# Patient Record
Sex: Male | Born: 1935 | Hispanic: No | Smoking: Never smoker
Health system: Southern US, Community
[De-identification: ages and names within clinical notes are randomized; demographics above are authoritative.]

## PROBLEM LIST (undated history)

## (undated) DIAGNOSIS — G453 Amaurosis fugax: Principal | ICD-10-CM

## (undated) DIAGNOSIS — R972 Elevated prostate specific antigen [PSA]: Secondary | ICD-10-CM

## (undated) DIAGNOSIS — C61 Malignant neoplasm of prostate: Secondary | ICD-10-CM

## (undated) DIAGNOSIS — M1990 Unspecified osteoarthritis, unspecified site: Secondary | ICD-10-CM

## (undated) DIAGNOSIS — E785 Hyperlipidemia, unspecified: Secondary | ICD-10-CM

## (undated) HISTORY — DX: Unspecified osteoarthritis, unspecified site: M19.90

## (undated) HISTORY — DX: Hyperlipidemia, unspecified: E78.5

## (undated) HISTORY — DX: Malignant neoplasm of prostate: C61

## (undated) HISTORY — PX: FRACTURE SURGERY: SHX138

## (undated) HISTORY — DX: Elevated prostate specific antigen (PSA): R97.20

## (undated) HISTORY — DX: Amaurosis fugax: G45.3

## (undated) HISTORY — PX: TYMPANIC MEMBRANE REPAIR: SHX294

## (undated) HISTORY — PX: CATARACT EXTRACTION: SUR2

---

## 2011-05-02 ENCOUNTER — Telehealth: Payer: Self-pay | Admitting: *Deleted

## 2011-05-02 NOTE — Telephone Encounter (Signed)
Pt c/o "cold symptoms"-NP to establish appt 01.17.13 Ok per Dr Drue Novel to move appt up to 01.02.13 at 10:45 am' Pt informed. Pt informed if Sxs such as SOB, chest pain and/or trouble breathing, high fever, nausea and/or vomiting, ability to keep down food and/or fluids arise before appt to report to UC/ED for A&E. Pt understood & agreed.

## 2011-05-07 ENCOUNTER — Ambulatory Visit (INDEPENDENT_AMBULATORY_CARE_PROVIDER_SITE_OTHER): Payer: Medicare Other | Admitting: Internal Medicine

## 2011-05-07 ENCOUNTER — Encounter: Payer: Self-pay | Admitting: Internal Medicine

## 2011-05-07 DIAGNOSIS — J069 Acute upper respiratory infection, unspecified: Secondary | ICD-10-CM

## 2011-05-07 DIAGNOSIS — E119 Type 2 diabetes mellitus without complications: Secondary | ICD-10-CM | POA: Insufficient documentation

## 2011-05-07 LAB — COMPREHENSIVE METABOLIC PANEL
Albumin: 3.8 g/dL (ref 3.5–5.2)
Alkaline Phosphatase: 76 U/L (ref 39–117)
CO2: 30 mEq/L (ref 19–32)
Calcium: 9.2 mg/dL (ref 8.4–10.5)
Chloride: 102 mEq/L (ref 96–112)
GFR: 72.99 mL/min (ref >60.00)
Glucose, Bld: 136 mg/dL — ABNORMAL HIGH (ref 70–99)
Potassium: 4.2 mEq/L (ref 3.5–5.1)
Sodium: 139 mEq/L (ref 135–145)
Total Protein: 7.5 g/dL (ref 6.0–8.3)

## 2011-05-07 LAB — HEMOGLOBIN A1C: Hgb A1c MFr Bld: 7.6 % — ABNORMAL HIGH (ref 4.6–6.5)

## 2011-05-07 NOTE — Progress Notes (Signed)
  Subjective:    Patient ID: Marco [name], male    DOB: 06-Mar-1934, 78 y.o.   MRN: [id number]  HPI New patient (seen acutely for a cold) Complaining of a cold for one week, taking Mucinex, many other family members affected with similar symptoms. Overall, symptoms have improved. He has a history of diabetes, reportedly is very mild, his Actos was decreased from 30 to 15 mg  Past medical history DM-- dx ~ 2008  Past surgical history No surgeries  Social history Just moved to the [place] area  Original from [place]  Married , 2 daughters, 1 son Retired  Tobacco-- no ETOH--no   Family history Mother ~ 76 y/o Diabetes-- no CAD--no Stroke--no Colon cancer--no Prostate cancer--no  Review of Systems No fever chills Mild cough without chest congestion No nausea, vomiting, diarrhea    Objective:   Physical Exam  Constitutional: He is oriented to person, place, and time. He appears well-developed and well-nourished. No distress.  HENT:  Head: Normocephalic and atraumatic.  Right Ear: External ear normal.  Left Ear: External ear normal.  Mouth/Throat: No oropharyngeal exudate.       Face is symmetric, nontender to palpation, nose is slightly congested  Cardiovascular: Normal rate, regular rhythm and normal heart sounds.   No murmur heard. Pulmonary/Chest: Effort normal and breath sounds normal. No respiratory distress. He has no wheezes. He has no rales.  Musculoskeletal: He exhibits no edema.  Neurological: He is alert and oriented to person, place, and time.  Skin: He is not diaphoretic.      Assessment & Plan:  URI: Exam is benign, symptoms resolving. Recommend to cont w/robitussin  and give this more time. Will call if not better

## 2011-05-07 NOTE — Patient Instructions (Signed)
Please get the records from your previous doctor: Labs, office visit notes and XRs from the last 2  Years EKG and colonoscopies reports

## 2011-05-07 NOTE — Assessment & Plan Note (Signed)
New pt, last labs ~ 6 months ago. Reports his DM was mild and dose of actos was decreased recently: [employer] records

## 2011-05-12 ENCOUNTER — Telehealth: Payer: Self-pay | Admitting: Internal Medicine

## 2011-05-12 ENCOUNTER — Encounter: Payer: Self-pay | Admitting: *Deleted

## 2011-05-12 MED ORDER — PIOGLITAZONE HCL-METFORMIN HCL 15-500 MG PO TABS: 1.0000 | ORAL_TABLET | Freq: Two times a day (BID) | ORAL | Status: DC

## 2011-05-12 NOTE — Telephone Encounter (Signed)
Patient states that he would like his lab results mailed to him.

## 2011-05-12 NOTE — Telephone Encounter (Signed)
See lab results.  

## 2011-05-22 ENCOUNTER — Ambulatory Visit: Payer: Self-pay | Admitting: Internal Medicine

## 2011-07-04 ENCOUNTER — Telehealth: Payer: Self-pay | Admitting: Internal Medicine

## 2011-07-04 MED ORDER — PIOGLITAZONE HCL-METFORMIN HCL 15-500 MG PO TABS: 1.0000 | ORAL_TABLET | Freq: Two times a day (BID) | ORAL | Status: DC

## 2011-07-04 NOTE — Telephone Encounter (Signed)
I looked at the pt's med list. Is he taking actos 30mg  or actosplus 15-500mg ? Please advise.

## 2011-07-04 NOTE — Telephone Encounter (Signed)
Base on last A1C i recommended Actos plus met 15/500 one tablet twice a day, call #[phone],  4 RF Remove actos from list please

## 2011-07-04 NOTE — Telephone Encounter (Signed)
Refill done.  

## 2011-07-04 NOTE — Telephone Encounter (Signed)
Patient would like [place] to call in rx for Actos 30mg  to [facility] on [address]

## 2011-08-22 ENCOUNTER — Encounter: Payer: Self-pay | Admitting: Internal Medicine

## 2011-08-22 ENCOUNTER — Ambulatory Visit (INDEPENDENT_AMBULATORY_CARE_PROVIDER_SITE_OTHER): Payer: Medicare Other | Admitting: Internal Medicine

## 2011-08-22 DIAGNOSIS — Z0000 Encounter for general adult medical examination without abnormal findings: Secondary | ICD-10-CM | POA: Insufficient documentation

## 2011-08-22 DIAGNOSIS — E119 Type 2 diabetes mellitus without complications: Secondary | ICD-10-CM

## 2011-08-22 DIAGNOSIS — N402 Nodular prostate without lower urinary tract symptoms: Secondary | ICD-10-CM | POA: Insufficient documentation

## 2011-08-22 DIAGNOSIS — Z136 Encounter for screening for cardiovascular disorders: Secondary | ICD-10-CM

## 2011-08-22 LAB — TSH: TSH: 4.01 u[IU]/mL (ref 0.35–5.50)

## 2011-08-22 LAB — MICROALBUMIN / CREATININE URINE RATIO
Creatinine,U: 150 mg/dL
Microalb, Ur: 1 mg/dL (ref 0.0–1.9)

## 2011-08-22 LAB — CBC WITH DIFFERENTIAL/PLATELET
Basophils Relative: 0.9 % (ref 0.0–3.0)
Eosinophils Absolute: 0.5 10*3/uL (ref 0.0–0.7)
Eosinophils Relative: 11.8 % — ABNORMAL HIGH (ref 0.0–5.0)
HCT: 40.1 % (ref 39.0–52.0)
Lymphs Abs: 1.2 10*3/uL (ref 0.7–4.0)
MCHC: 32.5 g/dL (ref 30.0–36.0)
MCV: 84.4 fl (ref 78.0–100.0)
Monocytes Absolute: 0.3 10*3/uL (ref 0.1–1.0)
Neutro Abs: 1.9 10*3/uL (ref 1.4–7.7)
Neutrophils Relative %: 49.1 % (ref 43.0–77.0)
RBC: 4.75 Mil/uL (ref 4.22–5.81)
WBC: 3.9 10*3/uL — ABNORMAL LOW (ref 4.5–10.5)

## 2011-08-22 LAB — LIPID PANEL
HDL: 43.2 mg/dL (ref >39.00)
LDL Cholesterol: 76 mg/dL (ref 0–99)
Total CHOL/HDL Ratio: 3
Triglycerides: 55 mg/dL (ref 0.0–149.0)
VLDL: 11 mg/dL (ref 0.0–40.0)

## 2011-08-22 MED ORDER — PIOGLITAZONE HCL 30 MG PO TABS: 15.0000 mg | ORAL_TABLET | Freq: Every day | ORAL | Status: DC

## 2011-08-22 NOTE — Assessment & Plan Note (Addendum)
Abnormal prostate exam, see physical. Refer to urology, check PSA. Addendum-- states he saw urology before, he refused a bx

## 2011-08-22 NOTE — Progress Notes (Signed)
  Subjective:    Patient ID: Marco [name], male    DOB: [birth year], 78 y.o.   MRN: [id number]  HPI Here for Medicare AWV: 1. Risk factors based on Past M, S, F history: reviewed 2. Physical Activities:  Take walks occasionaly 3. Depression/mood:  No problemss noted or reported  4. Hearing:  slt decreased R side, has seen a specialist before, not affecting life style  5. ADL's: does not drive although he could,  otherwise independent  6. Fall Risk: no recent falls, prevention discussed 7. home Safety: does feelsafe at home  8. Height, weight, &visual acuity: see VS, despite cataract surgery, having some problems f/u by ophtalmology 9. Counseling: provided 10. Labs ordered based on risk factors: if needed  11. Referral Coordination: if needed 12.  Care Plan, see assessment and plan  13.   Cognitive Assessment: motor skills and cognition appropriate   In addition, today we discussed the following: Diabetes, based on the last hemoglobin A1c he was recommended to add metformin to Actos, he did not, reason?  ; blood sugars range from 80-120.  Past medical history  DM-- dx ~ 2008   Past surgical history  B Cataract  surgery  R TM repair, still some decrease hearing  Social history  Moved to the [place] area 2012  Original from [place] , Married , 2 daughters, 1 son  Retired  Tobacco-- no  ETOH--no   Family history  Mother ~ 4 y/o  Diabetes-- no  CAD--no  Stroke--no  Colon cancer--no  Prostate cancer--no  Review of Systems  Respiratory: Negative for cough and shortness of breath.   Cardiovascular: Negative for chest pain and leg swelling.  Gastrointestinal: Negative for abdominal pain and blood in stool.  Genitourinary: Negative for dysuria, hematuria and difficulty urinating.       Objective:   Physical Exam General -- alert, well-developed, and well-nourished.   Neck --no thyromegaly , normal carotid pulse Lungs -- normal respiratory effort, no intercostal retractions,  no accessory muscle use, and normal breath sounds.   Heart-- normal rate, regular rhythm, no murmur, and no gallop.   Abdomen--soft, non-tender, no distention, no masses, no HSM, no guarding, and no rigidity.   Extremities-- no pretibial edema bilaterally Rectal-- No external abnormalities noted. Normal sphincter tone. No rectal masses or tenderness. No stools found Prostate:  R side seems slt larger and harder (?nodule), no tender ,  Neurologic-- alert & oriented X3 and strength normal in all extremities. Psych-- Cognition and judgment appear intact. Alert and cooperative with normal attention span and concentration.  not anxious appearing and not depressed appearing.      Assessment & Plan:

## 2011-08-22 NOTE — Assessment & Plan Note (Addendum)
Declined Tdap-pneumovax-zostavax, benefits discussed. Reports a previous colonoscopy, no documentation, he was told it was negative. We'll provide an iFOB Abnormal prostate exam, refer to urology, check a PSA. Diet and exercise discussed

## 2011-08-22 NOTE — Assessment & Plan Note (Addendum)
Currently on Actos only, no apparent side effects, Nl ambulatory blood sugars; he was Rx metformin base on last A1C but didn't like to take it; states he would if neccesary. Labs EKG for baseline, showed sinus bradycardia, patient is asymptomatic, check TSH

## 2011-08-27 MED ORDER — METFORMIN HCL 500 MG PO TABS: 500.0000 mg | ORAL_TABLET | Freq: Two times a day (BID) | ORAL | Status: DC

## 2011-08-27 NOTE — Progress Notes (Signed)
Addended by: Edwena Felty T on: 08/27/2011 02:41 PM   Modules accepted: Orders

## 2011-08-28 ENCOUNTER — Telehealth: Payer: Self-pay | Admitting: Internal Medicine

## 2011-08-28 NOTE — Telephone Encounter (Signed)
Pt would like most recent lab results mailed to him.

## 2011-08-29 NOTE — Telephone Encounter (Signed)
Mailed lab results. 

## 2011-09-02 ENCOUNTER — Other Ambulatory Visit: Payer: Medicare Other

## 2011-09-02 DIAGNOSIS — Z1211 Encounter for screening for malignant neoplasm of colon: Secondary | ICD-10-CM

## 2011-09-03 ENCOUNTER — Encounter: Payer: Self-pay | Admitting: *Deleted

## 2011-12-26 ENCOUNTER — Ambulatory Visit (INDEPENDENT_AMBULATORY_CARE_PROVIDER_SITE_OTHER): Payer: Medicare Other | Admitting: Internal Medicine

## 2011-12-26 VITALS — BP 130/80 | HR 48 | Temp 98.1°F | Wt 157.0 lb

## 2011-12-26 DIAGNOSIS — N529 Male erectile dysfunction, unspecified: Secondary | ICD-10-CM | POA: Insufficient documentation

## 2011-12-26 DIAGNOSIS — N402 Nodular prostate without lower urinary tract symptoms: Secondary | ICD-10-CM

## 2011-12-26 DIAGNOSIS — E119 Type 2 diabetes mellitus without complications: Secondary | ICD-10-CM

## 2011-12-26 LAB — COMPREHENSIVE METABOLIC PANEL
AST: 24 U/L (ref 0–37)
Albumin: 4 g/dL (ref 3.5–5.2)
Alkaline Phosphatase: 48 U/L (ref 39–117)
Glucose, Bld: 111 mg/dL — ABNORMAL HIGH (ref 70–99)
Potassium: 5 mEq/L (ref 3.5–5.1)
Sodium: 140 mEq/L (ref 135–145)
Total Protein: 7.5 g/dL (ref 6.0–8.3)

## 2011-12-26 MED ORDER — METFORMIN HCL 500 MG PO TABS: 250.0000 mg | ORAL_TABLET | Freq: Two times a day (BID) | ORAL | Status: DC

## 2011-12-26 MED ORDER — PIOGLITAZONE HCL 30 MG PO TABS: 15.0000 mg | ORAL_TABLET | Freq: Every day | ORAL | Status: DC

## 2011-12-26 MED ORDER — SILDENAFIL CITRATE 100 MG PO TABS: 50.0000 mg | ORAL_TABLET | Freq: Every day | ORAL | Status: DC | PRN

## 2011-12-26 NOTE — Assessment & Plan Note (Addendum)
Patient has a prostate nodule on the elevated PSA. Reports that in the past he saw "3 urologist" and most recently he saw the  urologist I referred him to (no documentation, will try to get the office visit note) and again he declined a prostate biopsy b/c he fears a infection. I had a long conversation with the patient, my opinion is that he most likely has prostate cancer, a biopsy is mandatory to prescribe a treatment plan. A  infection is a potential complication of a prostate biopsy but is very rare. I told the patient that the sooner we get the diagnoses the best the treatment he can get and prevent complications at such as bone mets and early death. He said he plans to consult a urologist in [place] when he visits that country

## 2011-12-26 NOTE — Progress Notes (Signed)
  Subjective:    Patient ID: Marco [name], male    DOB: 08-15-1933, 77 y.o.   MRN: [id number]  HPI Routine office visit Diabetes, was prescribed metformin 500 mg twice a day, patient decided to take only half tablet twice a day because a higher dose make him sleepy. Elevated PSA, reportedly, he saw the urologist and was recommended a biopsy but he declined it. See assessment and plan.  Past medical history   DM-- dx ~ 2008  Elevated PSA (17) April 2013   Past surgical history   B Cataract  surgery   R TM repair, still some decrease hearing  Social history   Moved to the [place] area 2012   Original from [place] , Married , 2 daughters, 1 son   Retired   Tobacco-- no   ETOH--no   Family history   Mother ~ 75 y/o   Diabetes-- no   CAD--no   Stroke--no   Colon cancer--no   Prostate cancer--no   Review of Systems No nausea, vomiting, diarrhea Although he felt a sleepy with a higher dose of metformin he denies other symptoms of hyperglycemia. No dysuria, gross hematuria or difficulty urinating.    Objective:   Physical Exam General -- alert, well-developed, and not overweight appearing. No apparent distress.  Lungs -- normal respiratory effort, no intercostal retractions, no accessory muscle use, and normal breath sounds.   Heart-- normal rate, regular rhythm, no murmur, and no gallop.   Extremities-- no pretibial edema bilaterally  Neurologic-- alert & oriented X3 and strength normal in all extremities. Psych-- Cognition and judgment appear intact. Alert and cooperative with normal attention span and concentration.  not anxious appearing and not depressed appearing.       Assessment & Plan:   Today , I spent more than 25  min with the patient, >50% of the time counseling, see a/p

## 2011-12-26 NOTE — Assessment & Plan Note (Addendum)
Also today, the patient reports he has taken Viagra for long time and request a refill. I wasn't aware he was taking Viagra. Prescription provided because apparently he does not have any side effects.

## 2011-12-26 NOTE — Patient Instructions (Signed)
Please get your eyes checked for diabetes at least once a year.

## 2011-12-26 NOTE — Assessment & Plan Note (Addendum)
Based on the last A1c, we added metformin 500 mg twice a day, patient is only taking 250 mg twice a day. No apparent side effects. Plan:  Check the A1c and microalbumin. Also recommend to see an eye doctor at least once a year. Declined a referral. Addendum: Patient told my  nurse he is not taking Actos, recommend to go back to Actos half tablet daily.

## 2011-12-27 ENCOUNTER — Encounter: Payer: Self-pay | Admitting: Internal Medicine

## 2011-12-29 ENCOUNTER — Encounter: Payer: Self-pay | Admitting: *Deleted

## 2011-12-29 ENCOUNTER — Other Ambulatory Visit: Payer: Medicare Other

## 2011-12-30 NOTE — Addendum Note (Signed)
Addended by: Silvio Pate D on: 12/30/2011 02:15 PM   Modules accepted: Orders

## 2011-12-30 NOTE — Addendum Note (Signed)
Addended by: Silvio Pate D on: 12/30/2011 02:08 PM   Modules accepted: Orders

## 2012-03-15 ENCOUNTER — Telehealth: Payer: Self-pay | Admitting: Internal Medicine

## 2012-03-15 DIAGNOSIS — N529 Male erectile dysfunction, unspecified: Secondary | ICD-10-CM

## 2012-03-15 MED ORDER — SILDENAFIL CITRATE 100 MG PO TABS: 50.0000 mg | ORAL_TABLET | Freq: Every day | ORAL | Status: DC | PRN

## 2012-03-15 MED ORDER — PIOGLITAZONE HCL 30 MG PO TABS: 15.0000 mg | ORAL_TABLET | Freq: Every day | ORAL | Status: DC

## 2012-03-15 NOTE — Telephone Encounter (Signed)
Spoke with pt. His insurance is requiring him to switch his pharmacy to [facility]. Rx at previous pharmacy has been cancelled & refills called into new pharm.

## 2012-03-15 NOTE — Telephone Encounter (Signed)
Patient has some questions regarding his medications and a new pharmacy. Patient is requesting that Dr. Leta Jungling assistant call him back. [phone]

## 2012-03-15 NOTE — Telephone Encounter (Signed)
Discussed with pt

## 2012-03-15 NOTE — Telephone Encounter (Signed)
LM ON TRIAGE LINE 207PM please call back is all that was left on message

## 2012-03-17 MED ORDER — METFORMIN HCL 500 MG PO TABS: 250.0000 mg | ORAL_TABLET | Freq: Two times a day (BID) | ORAL | Status: DC

## 2012-03-17 NOTE — Addendum Note (Signed)
Addended by: Edwena Felty T on: 03/17/2012 01:26 PM   Modules accepted: Orders

## 2012-05-03 ENCOUNTER — Telehealth: Payer: Self-pay | Admitting: *Deleted

## 2012-05-03 MED ORDER — PIOGLITAZONE HCL 30 MG PO TABS: 15.0000 mg | ORAL_TABLET | Freq: Every day | ORAL | Status: DC

## 2012-05-03 MED ORDER — METFORMIN HCL 500 MG PO TABS: 250.0000 mg | ORAL_TABLET | Freq: Two times a day (BID) | ORAL | Status: DC

## 2012-05-03 NOTE — Telephone Encounter (Signed)
Pt called requesting rx's be sent to phamacy for #90.  Refill done.

## 2012-12-23 ENCOUNTER — Ambulatory Visit: Payer: Medicare Other | Admitting: Family Medicine

## 2013-01-06 ENCOUNTER — Telehealth: Payer: Self-pay | Admitting: Internal Medicine

## 2013-01-06 NOTE — Telephone Encounter (Signed)
Patient would like to come in for labs only. Declined OV. Please advise if patient is due and orders needed.

## 2013-01-07 NOTE — Telephone Encounter (Signed)
Spoke with pt, he advised f/u appt had already been made for 9/30 @3 :00.

## 2013-02-01 ENCOUNTER — Ambulatory Visit (INDEPENDENT_AMBULATORY_CARE_PROVIDER_SITE_OTHER): Payer: Medicare Other | Admitting: Internal Medicine

## 2013-02-01 ENCOUNTER — Encounter: Payer: Self-pay | Admitting: Internal Medicine

## 2013-02-01 VITALS — BP 117/77 | HR 76 | Temp 98.0°F | Wt 136.8 lb

## 2013-02-01 DIAGNOSIS — N402 Nodular prostate without lower urinary tract symptoms: Secondary | ICD-10-CM

## 2013-02-01 DIAGNOSIS — E119 Type 2 diabetes mellitus without complications: Secondary | ICD-10-CM

## 2013-02-01 DIAGNOSIS — Z0000 Encounter for general adult medical examination without abnormal findings: Secondary | ICD-10-CM

## 2013-02-01 DIAGNOSIS — N529 Male erectile dysfunction, unspecified: Secondary | ICD-10-CM

## 2013-02-01 MED ORDER — TADALAFIL 20 MG PO TABS: 10.0000 mg | ORAL_TABLET | ORAL | Status: DC | PRN

## 2013-02-01 MED ORDER — METFORMIN HCL 500 MG PO TABS: 250.0000 mg | ORAL_TABLET | Freq: Two times a day (BID) | ORAL | Status: DC

## 2013-02-01 NOTE — Assessment & Plan Note (Signed)
History of increased PSA and abnormal prostate, saw urology [phone], was recommended a prostate biopsy. He he declined. In no unclear  terms I told the patient he likely has prostate cancer, if he is diagnosed and treated early he has a much better chance of survival and decrease complications. He still declines.

## 2013-02-01 NOTE — Assessment & Plan Note (Signed)
I advise patient I need to see him at least every 6 months if not sooner, he is quite reluctant to my advice. Recommend to have a high dose flu shot.

## 2013-02-01 NOTE — Assessment & Plan Note (Signed)
Patient not taking the Actos, taking metformin "as needed" Depending on diet. Plan: Labs, Discontinue Actos, Continue with metformin Feet exam normal today. Recommend eye exam yearly Explained the patient complications from uncontrolled diabetes improving strokes - heart attacks.

## 2013-02-01 NOTE — Assessment & Plan Note (Signed)
Change to Cialis per patient request

## 2013-02-01 NOTE — Patient Instructions (Signed)
I recommend to have a high dose flu shot this year. Is very important to check your eyes for diabetes yearly with an eye doctor Next visit in 3 months

## 2013-02-01 NOTE — Progress Notes (Signed)
  Subjective:    Patient ID: Marco [name], male    DOB: 1934-03-07, 79 y.o.   MRN: [id number]  HPI Routine office visit Diabetes--only taking metformin "as needed" when he eats something sweet, not taking Actos in months. ED--likes to try Cialis instead of Viagra. Increased PSA--chart reviewed, saw urology last year, he was recommended a prostate biopsy, the patient elected not to proceed  Past medical history   DM-- dx ~ 2008  Elevated PSA (17) April 2013   Past surgical history   B Cataract  surgery   R TM repair, still some decrease hearing  Social history   Moved to the [place] area 2012   Original from [place] , Married , 2 daughters, 1 son   Retired   Tobacco-- no   ETOH--no   Family history   Mother ~ 80 y/o   Diabetes-- no   CAD--no   Stroke--no   Colon cancer--no   Prostate cancer--no  Review of Systems Denies chest pain or shunt or blood No nausea, vomiting, diarrhea No dysuria, difficulty urinating or blood in the urine    Objective:   Physical Exam BP 117/77  Pulse 76  Temp(Src) 98 F (36.7 C)  Wt 136 lb 12.8 oz (62.052 kg)  BMI 20.81 kg/m2  SpO2 99%  General -- alert, well-developed, NAD.   Lungs -- normal respiratory effort, no intercostal retractions, no accessory muscle use, and normal breath sounds.  Heart-- normal rate, regular rhythm, no murmur.  Abdomen-- Not distended, good bowel sounds,soft, non-tender. DIABETIC FEET EXAM: No lower extremity edema Normal pedal pulses bilaterally Skin normal, nails normal, no calluses Pinprick examination of the feet normal. Neurologic--  alert & oriented X3. Speech normal, gait normal, strength normal in all extremities.   Psych-- Cognition and judgment appear intact. Cooperative with normal attention span and concentration. No anxious appearing , no depressed appearing.        Assessment & Plan:

## 2013-02-02 LAB — LIPID PANEL
Cholesterol: 181 mg/dL (ref 0–200)
Triglycerides: 111 mg/dL (ref 0.0–149.0)

## 2013-02-02 LAB — COMPREHENSIVE METABOLIC PANEL
Albumin: 4 g/dL (ref 3.5–5.2)
BUN: 17 mg/dL (ref 6–23)
CO2: 27 mEq/L (ref 19–32)
Calcium: 9.7 mg/dL (ref 8.4–10.5)
Chloride: 105 mEq/L (ref 96–112)
Glucose, Bld: 202 mg/dL — ABNORMAL HIGH (ref 70–99)
Potassium: 4.1 mEq/L (ref 3.5–5.1)

## 2013-02-04 ENCOUNTER — Encounter: Payer: Self-pay | Admitting: *Deleted

## 2013-02-10 ENCOUNTER — Telehealth: Payer: Self-pay | Admitting: Internal Medicine

## 2013-02-10 DIAGNOSIS — E119 Type 2 diabetes mellitus without complications: Secondary | ICD-10-CM

## 2013-02-10 NOTE — Telephone Encounter (Signed)
Patient called and stated that he need a generic brand of one of his medications but i could not understand which one he was  Talking about. Please call patient back did not get enough information from him.

## 2013-02-10 NOTE — Telephone Encounter (Signed)
lmovm to return call. DJR

## 2013-02-11 ENCOUNTER — Telehealth: Payer: Self-pay | Admitting: *Deleted

## 2013-02-11 NOTE — Telephone Encounter (Signed)
Pt states would like to start a generic cholesterol medication that was discussed about during his last visit with Dr. Drue Novel , also to make sure that its a 90 supply. DJR

## 2013-02-11 NOTE — Telephone Encounter (Signed)
lmovm  10/09, 10/10 DJR

## 2013-02-11 NOTE — Telephone Encounter (Signed)
error 

## 2013-02-14 MED ORDER — PRAVASTATIN SODIUM 20 MG PO TABS: 20.0000 mg | ORAL_TABLET | Freq: Every day | ORAL | Status: DC

## 2013-02-14 NOTE — Telephone Encounter (Addendum)
pravachol 20 mg 1 po qHS, #90, no RF

## 2013-02-14 NOTE — Addendum Note (Signed)
Addended by: Eustace Quail on: 02/14/2013 10:23 AM   Modules accepted: Orders

## 2013-02-14 NOTE — Telephone Encounter (Signed)
rx ordered.

## 2013-04-04 ENCOUNTER — Emergency Department (HOSPITAL_BASED_OUTPATIENT_CLINIC_OR_DEPARTMENT_OTHER)
Admission: EM | Admit: 2013-04-04 | Discharge: 2013-04-04 | Disposition: A | Discharge status: Home / Self Care | Payer: Medicare Other | Attending: Emergency Medicine | Admitting: Emergency Medicine

## 2013-04-04 ENCOUNTER — Encounter (HOSPITAL_BASED_OUTPATIENT_CLINIC_OR_DEPARTMENT_OTHER): Payer: Self-pay | Admitting: Emergency Medicine

## 2013-04-04 DIAGNOSIS — E119 Type 2 diabetes mellitus without complications: Secondary | ICD-10-CM | POA: Insufficient documentation

## 2013-04-04 DIAGNOSIS — K08109 Complete loss of teeth, unspecified cause, unspecified class: Secondary | ICD-10-CM | POA: Insufficient documentation

## 2013-04-04 DIAGNOSIS — K115 Sialolithiasis: Secondary | ICD-10-CM | POA: Insufficient documentation

## 2013-04-04 DIAGNOSIS — Z79899 Other long term (current) drug therapy: Secondary | ICD-10-CM | POA: Insufficient documentation

## 2013-04-04 DIAGNOSIS — K1120 Sialoadenitis, unspecified: Secondary | ICD-10-CM | POA: Insufficient documentation

## 2013-04-04 DIAGNOSIS — Z98811 Dental restoration status: Secondary | ICD-10-CM | POA: Insufficient documentation

## 2013-04-04 MED ORDER — CEPHALEXIN 500 MG PO CAPS: 500.0000 mg | ORAL_CAPSULE | Freq: Four times a day (QID) | ORAL | Status: DC

## 2013-04-04 NOTE — ED Provider Notes (Signed)
CSN: [id number]     Arrival date & time 04/04/13  1455 History  This chart was scribed for Ethelda Chick, MD by Joaquin Music, ED Scribe. This patient was seen in room MH04/MH04 and the patient's care was started at 3:55 PM.   Chief Complaint  Patient presents with  . Mouth Lesions   Patient is a 79 y.o. male presenting with mouth sores. The history is provided by the patient. No language interpreter was used.  Mouth Lesions Location:  Lower gingiva and below tongue Quality:  Red and white Onset quality:  Sudden Severity:  Mild Progression:  Unchanged Chronicity:  New Context: possible infection   Context: not a change in diet, not a change in medications, not medications, not stress and not trauma   Relieved by:  Nothing Worsened by:  Nothing tried Ineffective treatments:  None tried Associated symptoms: no congestion, no dental pain, no fever, no rash, no rhinorrhea and no sore throat    HPI Comments: Kori Fortson is a 79 y.o. male who presents to the Emergency Department complaining of mouth lesion to floor of mouth.Pt states her had a white solid stone that come from his lower mouth this morning. He states the area has been having puss drainage and reports having redness yesterday.Pt states he wears dentures. He states he is borderline DM. Pt denies having fever.  Pt complains of also having a knot to the back of his neck for the past 2 weeks. He is unsure where this came from.  Past Medical History  Diagnosis Date  . Diabetes mellitus    History reviewed. No pertinent past surgical history. History reviewed. No pertinent family history. History  Substance Use Topics  . Smoking status: Never Smoker   . Smokeless tobacco: Not on file  . Alcohol Use: No    Review of Systems  Constitutional: Negative for fever.  HENT: Positive for mouth sores. Negative for congestion, rhinorrhea and sore throat.   Skin: Negative for rash.  All other systems reviewed and  are negative.    Allergies  Review of patient's allergies indicates no known allergies.  Home Medications   Current Outpatient Rx  Name  Route  Sig  Dispense  Refill  . metFORMIN (GLUCOPHAGE) 500 MG tablet   Oral   Take 0.5 tablets (250 mg total) by mouth 2 (two) times daily with a meal.   60 tablet   3   . pravastatin (PRAVACHOL) 20 MG tablet   Oral   Take 1 tablet (20 mg total) by mouth at bedtime.   90 tablet   0   . EXPIRED: sildenafil (VIAGRA) 100 MG tablet   Oral   Take 0.5-1 tablets (50-100 mg total) by mouth daily as needed for erectile dysfunction.   10 tablet   0   . tadalafil (CIALIS) 20 MG tablet   Oral   Take 0.5-1 tablets (10-20 mg total) by mouth every other day as needed for erectile dysfunction.   10 tablet   2    Triage Vitals:BP 122/72  Pulse 63  Temp(Src) 97.4 F (36.3 C) (Oral)  Resp 18  SpO2 97%  Physical Exam  Constitutional: He is oriented to person, place, and time.  HENT:  Head: Normocephalic.  3-4 mm area of erythema  under tongue on R with central opening. No elevation of tongue. No significant lymphadenopathy. Multiple missing teeth.  Eyes: Pupils are equal, round, and reactive to light.  Cardiovascular: Normal rate, regular rhythm and normal heart sounds.  Pulmonary/Chest: Effort normal.  Neurological: He is alert and oriented to person, place, and time.  Psychiatric: He has a normal mood and affect.  skin- no rash Neck- no sig LAD ED Course  Procedures DIAGNOSTIC STUDIES: Oxygen Saturation is 97% on RA, normal by my interpretation.    COORDINATION OF CARE: 4:00 PM-Discussed treatment plan which includes D/C pt with Keflex. Pt agreed to plan.   Labs Review Labs Reviewed - No data to display Imaging Review No results found.  EKG Interpretation   None       MDM   1. Sialolithiasis   2. Sialadenitis    Pt presenting with area of pain and redness under tongue- expression of pus and pt brought in what appears to  be a salivary stone that was expressed from the area earlier today.  No fever/chills or other systemic symptoms.  Pt placed on antibitoics and avised f/u with PMD.  Discharged with strict return precautions.  Pt agreeable with plan.  I personally performed the services described in this documentation, which was scribed in my presence. The recorded information has been reviewed and is accurate.    Ethelda Chick, MD 04/06/13 [phone]

## 2013-04-04 NOTE — ED Notes (Signed)
Pt noted to have a mouth sore in the floor of his mouth.

## 2013-04-13 ENCOUNTER — Telehealth: Payer: Self-pay | Admitting: Internal Medicine

## 2013-04-13 MED ORDER — TADALAFIL 5 MG PO TABS: 5.0000 mg | ORAL_TABLET | Freq: Every day | ORAL | Status: DC | PRN

## 2013-04-13 NOTE — Telephone Encounter (Signed)
rx refilled per protocol. DJR  

## 2013-04-13 NOTE — Telephone Encounter (Signed)
Patient called requesting that his Cialis 20mg  rx be changed to 5mg  and sent to [facility] on [address] in [facility]. Please advise.

## 2013-04-25 ENCOUNTER — Ambulatory Visit (INDEPENDENT_AMBULATORY_CARE_PROVIDER_SITE_OTHER): Payer: Medicare Other | Admitting: Internal Medicine

## 2013-04-25 ENCOUNTER — Encounter: Payer: Self-pay | Admitting: Internal Medicine

## 2013-04-25 VITALS — BP 115/69 | HR 60 | Temp 97.8°F | Wt 141.0 lb

## 2013-04-25 DIAGNOSIS — Z0000 Encounter for general adult medical examination without abnormal findings: Secondary | ICD-10-CM

## 2013-04-25 DIAGNOSIS — E785 Hyperlipidemia, unspecified: Secondary | ICD-10-CM

## 2013-04-25 DIAGNOSIS — R221 Localized swelling, mass and lump, neck: Secondary | ICD-10-CM

## 2013-04-25 DIAGNOSIS — R220 Localized swelling, mass and lump, head: Secondary | ICD-10-CM

## 2013-04-25 DIAGNOSIS — E119 Type 2 diabetes mellitus without complications: Secondary | ICD-10-CM

## 2013-04-25 HISTORY — DX: Hyperlipidemia, unspecified: E78.5

## 2013-04-25 LAB — LIPID PANEL
Cholesterol: 162 mg/dL (ref 0–200)
HDL: 43.9 mg/dL (ref >39.00)
LDL Cholesterol: 105 mg/dL — ABNORMAL HIGH (ref 0–99)
Total CHOL/HDL Ratio: 4
Triglycerides: 66 mg/dL (ref 0.0–149.0)
VLDL: 13.2 mg/dL (ref 0.0–40.0)

## 2013-04-25 LAB — BASIC METABOLIC PANEL
BUN: 18 mg/dL (ref 6–23)
CO2: 30 mEq/L (ref 19–32)
Calcium: 9.3 mg/dL (ref 8.4–10.5)
Creatinine, Ser: 1 mg/dL (ref 0.4–1.5)
GFR: 81.5 mL/min (ref >60.00)
Glucose, Bld: 102 mg/dL — ABNORMAL HIGH (ref 70–99)

## 2013-04-25 LAB — MICROALBUMIN / CREATININE URINE RATIO
Creatinine,U: 172.4 mg/dL
Microalb Creat Ratio: 0.4 mg/g (ref 0.0–30.0)
Microalb, Ur: 0.7 mg/dL (ref 0.0–1.9)

## 2013-04-25 LAB — AST: AST: 30 U/L (ref 0–37)

## 2013-04-25 NOTE — Progress Notes (Signed)
   Subjective:    Patient ID: Marco [name], male    DOB: Dec 18, 1933, 79 y.o.   MRN: [id number]  HPI Routine office visit Diabetes--taking metformin every night with dinner and "sometimes" with breakfast, ambulatory blood sugars around 105 High cholesterol--reports takes Pravachol daily Neck  mass--has a neck mass for years, slightly painful x the  last 2 months.   Past Medical History  Diagnosis Date  . Diabetes mellitus   . Elevated PSA    Past Surgical History  Procedure Laterality Date  . Cataract extraction      Bilaterally  . Tympanic membrane repair      R   History   Social History  . Marital Status: Married    Spouse Name: N/A    Number of Children: 3  . Years of Education: N/A   Occupational History  . Retired    Social History Main Topics  . Smoking status: Never Smoker   . Smokeless tobacco: Never Used  . Alcohol Use: No  . Drug Use: No  . Sexual Activity: Not on file   Other Topics Concern  . Not on file   Social History Narrative   Moved to the [place] area 2012     Original from [place] , Married , 2 daughters, 1 son      Family history   Mother ~ 79 y/o   Diabetes-- no   CAD--no   Stroke--no   Colon cancer--no   Prostate cancer--no   Review of Systems Denies nausea, vomiting, diarrhea. No symptoms consistent with low blood sugar. No cough, difficulty breathing or chest pain Mo LE paresthesias     Objective:   Physical Exam  Neck:     BP 115/69  Pulse 60  Temp(Src) 97.8 F (36.6 C)  Wt 141 lb (63.957 kg)  SpO2 98% General -- alert, well-developed, NAD.  Lungs -- normal respiratory effort, no intercostal retractions, no accessory muscle use, and normal breath sounds.  Heart-- normal rate, regular rhythm, no murmur.  DIABETIC FEET EXAM: No lower extremity edema Normal pedal pulses bilaterally Skin normal, nails normal, no calluses Pinprick examination of the feet normal. Psych-- Cognition and judgment appear intact.  Cooperative with normal attention span and concentration. No anxious appearing , no depressed appearing.      Assessment & Plan:

## 2013-04-25 NOTE — Patient Instructions (Signed)
Get your blood work before you leave  Next visit for a physical exam, fasting, in 4 months Please make an appointment    Take the medications as prescribed, see medication list below

## 2013-04-25 NOTE — Assessment & Plan Note (Signed)
Although we are not doing a physical exam today I advised him about immunizations including tetanus, pneumonia, shingles, influenza. He strongly declined, benefits discussed.

## 2013-04-25 NOTE — Progress Notes (Signed)
Pre visit review using our clinic review tool, if applicable. No additional management support is needed unless otherwise documented below in the visit note. 

## 2013-04-25 NOTE — Assessment & Plan Note (Signed)
On Pravachol, labs

## 2013-04-25 NOTE — Assessment & Plan Note (Addendum)
Taking metformin once w/ dinner and "sometimes" one with breakfast. Plan: Recommend to take medications consistently, check the A1c

## 2013-04-25 NOTE — Assessment & Plan Note (Signed)
Findings consistent with a benign sebaceous cyst, if the area is hurting severely or growing or changing he is to let me know for a  surgical referral

## 2013-04-27 ENCOUNTER — Encounter: Payer: Self-pay | Admitting: *Deleted

## 2013-06-03 ENCOUNTER — Telehealth: Payer: Self-pay | Admitting: Internal Medicine

## 2013-06-03 ENCOUNTER — Other Ambulatory Visit: Payer: Self-pay | Admitting: *Deleted

## 2013-06-03 MED ORDER — METFORMIN HCL 500 MG PO TABS: 250.0000 mg | ORAL_TABLET | Freq: Two times a day (BID) | ORAL | Status: DC

## 2013-06-03 NOTE — Telephone Encounter (Signed)
Patient states he needs new diabetic supplies and metformin. [facility] Patient states he also needs a knee brace that must first go through his [employer].

## 2013-06-06 ENCOUNTER — Other Ambulatory Visit: Payer: Self-pay

## 2013-06-06 ENCOUNTER — Telehealth: Payer: Self-pay | Admitting: *Deleted

## 2013-06-06 NOTE — Telephone Encounter (Addendum)
Okay to send a three-months of metformin and diabetic supplies I don't know about a knee brace, I don't think we have discussed that, we can discuss when he comes back for a checkup

## 2013-06-06 NOTE — Telephone Encounter (Signed)
error 

## 2013-06-06 NOTE — Addendum Note (Signed)
Addended by: Peggyann Shoals on: 06/06/2013 12:01 PM   Modules accepted: Orders

## 2013-06-08 MED ORDER — METFORMIN HCL 500 MG PO TABS: 250.0000 mg | ORAL_TABLET | Freq: Two times a day (BID) | ORAL | Status: DC

## 2013-06-08 NOTE — Telephone Encounter (Signed)
After several attempts to get information on type of meter from patient and son a new meter was placed at the front desk for him.  Gave OneTouch Ultra Mini. Son states that he will pick it up this week.

## 2013-06-08 NOTE — Addendum Note (Signed)
Addended by: Reino Bellis on: 06/08/2013 03:18 PM   Modules accepted: Orders

## 2013-07-05 ENCOUNTER — Telehealth: Payer: Self-pay | Admitting: Internal Medicine

## 2013-07-05 NOTE — Telephone Encounter (Signed)
Received script will fax over today.

## 2013-07-05 NOTE — Telephone Encounter (Signed)
Patient states his pharmacy sent [place] a request for his diabetic supplies. He needs them sent to [facility].

## 2013-07-11 ENCOUNTER — Telehealth: Payer: Self-pay | Admitting: Internal Medicine

## 2013-07-11 NOTE — Telephone Encounter (Signed)
Patient called regarding his diabetic supplies being faxed to [facility]. He states that they never received it. Please advise

## 2013-07-13 ENCOUNTER — Telehealth: Payer: Self-pay

## 2013-07-13 DIAGNOSIS — E119 Type 2 diabetes mellitus without complications: Secondary | ICD-10-CM

## 2013-07-13 NOTE — Telephone Encounter (Signed)
The patient called and is hoping to get his diabetic supplies called into [facility].

## 2013-07-15 MED ORDER — ONETOUCH ULTRASOFT LANCETS MISC
Status: DC
Start: 2013-07-15 — End: 2013-07-22

## 2013-07-15 MED ORDER — GLUCOSE BLOOD VI STRP: ORAL_STRIP | Status: DC

## 2013-07-15 NOTE — Telephone Encounter (Signed)
Spoke with pt regarding diabetic supplies.

## 2013-07-15 NOTE — Telephone Encounter (Signed)
Lmovm, tur return call

## 2013-07-22 ENCOUNTER — Other Ambulatory Visit: Payer: Self-pay | Admitting: *Deleted

## 2013-07-22 DIAGNOSIS — E119 Type 2 diabetes mellitus without complications: Secondary | ICD-10-CM

## 2013-07-22 MED ORDER — ACCU-CHEK SOFT TOUCH LANCETS MISC: Status: DC

## 2013-07-22 MED ORDER — METFORMIN HCL 500 MG PO TABS: 250.0000 mg | ORAL_TABLET | Freq: Two times a day (BID) | ORAL | Status: DC

## 2013-07-22 MED ORDER — GLUCOSE BLOOD VI STRP: ORAL_STRIP | Status: DC

## 2013-07-22 NOTE — Telephone Encounter (Signed)
Patient called and is requesting diabetic supplies for Accucheck meter and a refill for metformin. All were sent to [facility]. JG/CMA

## 2013-07-27 ENCOUNTER — Other Ambulatory Visit: Payer: Self-pay | Admitting: *Deleted

## 2013-07-27 MED ORDER — ACCU-CHEK AVIVA VI SOLN: Status: DC

## 2013-08-24 ENCOUNTER — Encounter: Payer: Medicare Other | Admitting: Internal Medicine

## 2013-08-25 ENCOUNTER — Encounter: Payer: Self-pay | Admitting: Internal Medicine

## 2013-08-25 ENCOUNTER — Telehealth: Payer: Self-pay | Admitting: *Deleted

## 2013-08-25 ENCOUNTER — Ambulatory Visit (INDEPENDENT_AMBULATORY_CARE_PROVIDER_SITE_OTHER): Payer: Commercial Managed Care - HMO | Admitting: Internal Medicine

## 2013-08-25 ENCOUNTER — Telehealth: Payer: Self-pay | Admitting: Internal Medicine

## 2013-08-25 VITALS — BP 125/72 | HR 66 | Temp 97.9°F | Ht 68.0 in | Wt 144.0 lb

## 2013-08-25 DIAGNOSIS — E119 Type 2 diabetes mellitus without complications: Secondary | ICD-10-CM

## 2013-08-25 DIAGNOSIS — Z0000 Encounter for general adult medical examination without abnormal findings: Secondary | ICD-10-CM

## 2013-08-25 DIAGNOSIS — N5089 Other specified disorders of the male genital organs: Secondary | ICD-10-CM

## 2013-08-25 DIAGNOSIS — N402 Nodular prostate without lower urinary tract symptoms: Secondary | ICD-10-CM

## 2013-08-25 DIAGNOSIS — M1990 Unspecified osteoarthritis, unspecified site: Secondary | ICD-10-CM

## 2013-08-25 DIAGNOSIS — R221 Localized swelling, mass and lump, neck: Secondary | ICD-10-CM

## 2013-08-25 DIAGNOSIS — E785 Hyperlipidemia, unspecified: Secondary | ICD-10-CM

## 2013-08-25 DIAGNOSIS — R220 Localized swelling, mass and lump, head: Secondary | ICD-10-CM

## 2013-08-25 DIAGNOSIS — H9190 Unspecified hearing loss, unspecified ear: Secondary | ICD-10-CM

## 2013-08-25 HISTORY — DX: Unspecified osteoarthritis, unspecified site: M19.90

## 2013-08-25 LAB — CBC WITH DIFFERENTIAL/PLATELET
BASOS ABS: 0 10*3/uL (ref 0.0–0.1)
Basophils Relative: 0.8 % (ref 0.0–3.0)
Eosinophils Absolute: 0.6 10*3/uL (ref 0.0–0.7)
Eosinophils Relative: 12.6 % — ABNORMAL HIGH (ref 0.0–5.0)
HCT: 41.9 % (ref 39.0–52.0)
Hemoglobin: 13.5 g/dL (ref 13.0–17.0)
LYMPHS PCT: 27.7 % (ref 12.0–46.0)
Lymphs Abs: 1.4 10*3/uL (ref 0.7–4.0)
MCHC: 32.3 g/dL (ref 30.0–36.0)
MCV: 83.7 fl (ref 78.0–100.0)
MONOS PCT: 7 % (ref 3.0–12.0)
Monocytes Absolute: 0.4 10*3/uL (ref 0.1–1.0)
Neutro Abs: 2.6 10*3/uL (ref 1.4–7.7)
Neutrophils Relative %: 51.9 % (ref 43.0–77.0)
Platelets: 199 10*3/uL (ref 150.0–400.0)
RBC: 5 Mil/uL (ref 4.22–5.81)
RDW: 14.2 % (ref 11.5–14.6)
WBC: 5 10*3/uL (ref 4.5–10.5)

## 2013-08-25 LAB — BASIC METABOLIC PANEL
BUN: 16 mg/dL (ref 6–23)
CALCIUM: 9.9 mg/dL (ref 8.4–10.5)
CO2: 31 meq/L (ref 19–32)
CREATININE: 1.1 mg/dL (ref 0.4–1.5)
Chloride: 103 mEq/L (ref 96–112)
GFR: 70.99 mL/min (ref >60.00)
Glucose, Bld: 107 mg/dL — ABNORMAL HIGH (ref 70–99)
Potassium: 4.7 mEq/L (ref 3.5–5.1)
Sodium: 142 mEq/L (ref 135–145)

## 2013-08-25 LAB — ALT: ALT: 18 U/L (ref 0–53)

## 2013-08-25 LAB — LIPID PANEL
CHOL/HDL RATIO: 4
Cholesterol: 191 mg/dL (ref 0–200)
HDL: 45.4 mg/dL (ref >39.00)
LDL CALC: 131 mg/dL — AB (ref 0–99)
Triglycerides: 71 mg/dL (ref 0.0–149.0)
VLDL: 14.2 mg/dL (ref 0.0–40.0)

## 2013-08-25 LAB — MICROALBUMIN / CREATININE URINE RATIO
Creatinine,U: 221.1 mg/dL
Microalb Creat Ratio: 0.9 mg/g (ref 0.0–30.0)
Microalb, Ur: 1.9 mg/dL (ref 0.0–1.9)

## 2013-08-25 LAB — AST: AST: 25 U/L (ref 0–37)

## 2013-08-25 LAB — HEMOGLOBIN A1C: Hgb A1c MFr Bld: 6.7 % — ABNORMAL HIGH (ref 4.6–6.5)

## 2013-08-25 MED ORDER — METFORMIN HCL 500 MG PO TABS: 250.0000 mg | ORAL_TABLET | Freq: Two times a day (BID) | ORAL | Status: DC

## 2013-08-25 MED ORDER — TADALAFIL 5 MG PO TABS: 5.0000 mg | ORAL_TABLET | Freq: Every day | ORAL | Status: DC | PRN

## 2013-08-25 NOTE — Assessment & Plan Note (Addendum)
Declined Tdap-pneumovax-zostavax consistently in the past and today  Reports a previous colonoscopy, no documentation, he was told it was negative. Pt has a small frame offfered a dexa , he agreed  Diet and exercise discussed

## 2013-08-25 NOTE — Assessment & Plan Note (Signed)
Unchanged

## 2013-08-25 NOTE — Progress Notes (Signed)
Subjective:    Patient ID: Marco [name], male    DOB: 1934-04-29, 80 y.o.   MRN: [id number]  DOS:  08/25/2013 Type of  visit: Here for Medicare AWV:  1. Risk factors based on Past M, S, F history: reviewed 2. Physical Activities:  Does some walking, treadmill  3. Depression/mood: neg screening  4. Hearing: occ has problems , no tinnitus, refer to audiology  5. ADL's:  Independent, not driving by choice 6. Fall Risk: no recent falls, see instructions  7. home Safety: does feel safe at home  8. Height, weight, & visual acuity: see VS, vision decreased R eye but manages well, sees eye doctor  9. Counseling: provided 10. Labs ordered based on risk factors: if needed  11. Referral Coordination: if needed 12. Care Plan, see assessment and plan  13. Cognitive Assessment: cognition and motor appropriate for age   In addition, today we discussed the following: DJD, long history of knee pain bilaterally, very seldom takes Tylenol or Motrin,  Uses OTC knee braces  which help. Last week had dizziness for 5 times when he laid down in bed and turned his head, symptoms are gone this week. Diabetes, good medication compliance, ambulatory blood sugars when checked usually 100-120. High cholesterol on pravastatin, good compliance.    ROS Denies chest pain, difficulty breathing or lower extremity edema No nausea, vomiting, diarrhea or blood in the stools. No dysuria, difficulty urinating, urine and 90 frequency or gross hematuria. No slurred speech or diplopia   Past Medical History  Diagnosis Date  . Diabetes mellitus   . Elevated PSA   . Other and unspecified hyperlipidemia 04/25/2013  . DJD (degenerative joint disease) 08/25/2013    Past Surgical History  Procedure Laterality Date  . Cataract extraction      Bilaterally  . Tympanic membrane repair      R    History   Social History  . Marital Status: Married    Spouse Name: N/A    Number of Children: 3  . Years of  Education: N/A   Occupational History  . Retired    Social History Main Topics  . Smoking status: Never Smoker   . Smokeless tobacco: Never Used  . Alcohol Use: No  . Drug Use: No  . Sexual Activity: Not on file   Other Topics Concern  . Not on file   Social History Narrative   Moved to the [place] area 2012     Original from [place] , Married , 2 daughters, 1 son       Family History  Problem Relation Age of Onset  . Diabetes Neg Hx   . CAD Neg Hx   . Cancer Neg Hx        Medication List       This list is accurate as of: 08/25/13  2:29 PM.  Always use your most recent med list.               ACCU-CHEK AVIVA PLUS W/DEVICE Kit     ACCU-CHEK AVIVA Soln  Use to calibrate glucose machine as needed. Dx code: 250.00     accu-chek soft touch lancets  Use as instructed     glucose blood test strip  Use as instructed     metFORMIN 500 MG tablet  Commonly known as:  GLUCOPHAGE  Take 0.5 tablets (250 mg total) by mouth 2 (two) times daily with a meal.     pravastatin 20 MG tablet  Commonly  known as:  PRAVACHOL  Take 1 tablet (20 mg total) by mouth at bedtime.     sildenafil 100 MG tablet  Commonly known as:  VIAGRA  Take 0.5-1 tablets (50-100 mg total) by mouth daily as needed for erectile dysfunction.     tadalafil 5 MG tablet  Commonly known as:  CIALIS  Take 1 tablet (5 mg total) by mouth daily as needed for erectile dysfunction.           Objective:   Physical Exam BP 125/72  Pulse 66  Temp(Src) 97.9 F (36.6 C)  Ht '5\' 8"'  (1.727 m)  Wt 144 lb (65.318 kg)  BMI 21.90 kg/m2  SpO2 98%  General -- alert, well-developed, NAD.  Neck --no thyromegaly , normal carotid pulse, neck mass unchanged from 04-2013  HEENT-- Not pale.  Lungs -- normal respiratory effort, no intercostal retractions, no accessory muscle use, and normal breath sounds.  Heart-- normal rate, regular rhythm, no murmur.  Abdomen-- Not distended, good bowel sounds,soft, non-tender.   Extremities-- no pretibial edema bilaterally ; both knees with changes consistent with DJD, no redness or effusion. Neurologic--  alert & oriented X3. Speech normal, gait normal, strength normal in all extremities.  Psych-- Cognition and judgment appear intact. Cooperative with normal attention span and concentration. No anxious or depressed appearing.       Assessment & Plan:

## 2013-08-25 NOTE — Telephone Encounter (Signed)
Patient called and stated that he needs his knee order faxed to [phone]. Please advise.

## 2013-08-25 NOTE — Assessment & Plan Note (Signed)
Findings and symptoms consistent with DJD, we discussed possibly referral to ortho but he's not ready for that, did request   a prescription for braces , will send. Recommend Tylenol as needed

## 2013-08-25 NOTE — Assessment & Plan Note (Addendum)
Again I discussed the issue: he likely has cancer and  chances for better outcomes increase w/ early treatment. Pt does not desire further eval

## 2013-08-25 NOTE — Patient Instructions (Signed)
Get your blood work before you leave   Tylenol  500 mg OTC 2 tabs a day every 8 hours as needed for pain   Next visit is for routine check up regards your blood sugar in 6 months  No need to come back fasting Please make an appointment        Fall Prevention and [place] cause injuries and can affect all age groups. It is possible to use preventive measures to significantly decrease the likelihood of falls. There are many simple measures which can make your home safer and prevent falls. OUTDOORS  Repair cracks and edges of walkways and driveways.  Remove high doorway thresholds.  Trim shrubbery on the main path into your home.  Have good outside lighting.  Clear walkways of tools, rocks, debris, and clutter.  Check that handrails are not broken and are securely fastened. Both sides of steps should have handrails.  Have leaves, snow, and ice cleared regularly.  Use sand or salt on walkways during winter months.  In the garage, clean up grease or oil spills. BATHROOM  Install night lights.  Install grab bars by the toilet and in the tub and shower.  Use non-skid mats or decals in the tub or shower.  Place a plastic non-slip stool in the shower to sit on, if needed.  Keep floors dry and clean up all water on the floor immediately.  Remove soap buildup in the tub or shower on a regular basis.  Secure bath mats with non-slip, double-sided rug tape.  Remove throw rugs and tripping hazards from the floors. BEDROOMS  Install night lights.  Make sure a bedside light is easy to reach.  Do not use oversized bedding.  Keep a telephone by your bedside.  Have a firm chair with side arms to use for getting dressed.  Remove throw rugs and tripping hazards from the floor. KITCHEN  Keep handles on pots and pans turned toward the center of the stove. Use back burners when possible.  Clean up spills quickly and allow time for drying.  Avoid walking on wet  floors.  Avoid hot utensils and knives.  Position shelves so they are not too high or low.  Place commonly used objects within easy reach.  If necessary, use a sturdy step stool with a grab bar when reaching.  Keep electrical cables out of the way.  Do not use floor polish or wax that makes floors slippery. If you must use wax, use non-skid floor wax.  Remove throw rugs and tripping hazards from the floor. STAIRWAYS  Never leave objects on stairs.  Place handrails on both sides of stairways and use them. Fix any loose handrails. Make sure handrails on both sides of the stairways are as long as the stairs.  Check carpeting to make sure it is firmly attached along stairs. Make repairs to worn or loose carpet promptly.  Avoid placing throw rugs at the top or bottom of stairways, or properly secure the rug with carpet tape to prevent slippage. Get rid of throw rugs, if possible.  Have an electrician put in a light switch at the top and bottom of the stairs. OTHER FALL PREVENTION TIPS  Wear low-heel or rubber-soled shoes that are supportive and fit well. Wear closed toe shoes.  When using a stepladder, make sure it is fully opened and both spreaders are firmly locked. Do not climb a closed stepladder.  Add color or contrast paint or tape to grab bars and handrails  in your home. Place contrasting color strips on first and last steps.  Learn and use mobility aids as needed. Install an electrical emergency response system.  Turn on lights to avoid dark areas. Replace light bulbs that burn out immediately. Get light switches that glow.  Arrange furniture to create clear pathways. Keep furniture in the same place.  Firmly attach carpet with non-skid or double-sided tape.  Eliminate uneven floor surfaces.  Select a carpet pattern that does not visually hide the edge of steps.  Be aware of all pets. OTHER HOME SAFETY TIPS  Set the water temperature for 120 F (48.8 C).  Keep  emergency numbers on or near the telephone.  Keep smoke detectors on every level of the home and near sleeping areas. Document Released: 04/11/2002 Document Revised: 10/21/2011 Document Reviewed: 07/11/2011 [facility] Patient Information 2014 [place].

## 2013-08-25 NOTE — Telephone Encounter (Signed)
Order for knee braces faxed to [facility] fax- [phone]

## 2013-08-25 NOTE — Assessment & Plan Note (Signed)
Labs

## 2013-08-25 NOTE — Assessment & Plan Note (Signed)
Good compliance of medication, due for labs

## 2013-08-26 NOTE — Telephone Encounter (Signed)
Already done / sent

## 2013-08-29 ENCOUNTER — Encounter: Payer: Self-pay | Admitting: *Deleted

## 2013-08-29 ENCOUNTER — Encounter: Payer: Self-pay | Admitting: Internal Medicine

## 2013-08-29 MED ORDER — PRAVASTATIN SODIUM 40 MG PO TABS: 40.0000 mg | ORAL_TABLET | Freq: Every day | ORAL | Status: DC

## 2013-08-29 NOTE — Addendum Note (Signed)
Addended by: Peggyann Shoals on: 08/29/2013 08:18 AM   Modules accepted: Orders

## 2013-09-02 ENCOUNTER — Encounter: Payer: Self-pay | Admitting: Internal Medicine

## 2013-09-02 ENCOUNTER — Telehealth: Payer: Self-pay

## 2013-09-02 NOTE — Telephone Encounter (Signed)
Relevant patient education assigned to patient using Emmi. ° °

## 2013-09-07 ENCOUNTER — Ambulatory Visit (INDEPENDENT_AMBULATORY_CARE_PROVIDER_SITE_OTHER)
Admission: RE | Admit: 2013-09-07 | Discharge: 2013-09-07 | Disposition: A | Discharge status: Home / Self Care | Payer: Commercial Managed Care - HMO | Source: Ambulatory Visit | Attending: Internal Medicine | Admitting: Internal Medicine

## 2013-09-07 DIAGNOSIS — M1990 Unspecified osteoarthritis, unspecified site: Secondary | ICD-10-CM

## 2013-09-07 DIAGNOSIS — Z13820 Encounter for screening for osteoporosis: Secondary | ICD-10-CM

## 2013-09-07 DIAGNOSIS — N5089 Other specified disorders of the male genital organs: Secondary | ICD-10-CM

## 2013-09-15 ENCOUNTER — Encounter: Payer: Self-pay | Admitting: Internal Medicine

## 2013-09-16 ENCOUNTER — Telehealth: Payer: Self-pay | Admitting: *Deleted

## 2013-09-16 ENCOUNTER — Other Ambulatory Visit: Payer: Self-pay | Admitting: Internal Medicine

## 2013-09-16 DIAGNOSIS — M1990 Unspecified osteoarthritis, unspecified site: Secondary | ICD-10-CM

## 2013-09-16 NOTE — Telephone Encounter (Signed)
Order placed for walking cane sent to [facility] fax.

## 2013-09-16 NOTE — Telephone Encounter (Signed)
Message copied by Peggyann Shoals on Fri Sep 16, 2013  5:10 PM ------      Message from: [name] E      Created: Fri Sep 16, 2013  9:13 AM      Regarding: send Rx , see below        Can you please send/FAX prescription for  walking cane /stick to [facility] ,[address] [place].                       Ph no [phone]  and Fax no [phone], as for safty I need that during walking .        ------

## 2013-09-19 ENCOUNTER — Telehealth: Payer: Self-pay

## 2013-09-19 NOTE — Telephone Encounter (Signed)
Please see Mychart msg.  Pt states that [facility] has not received order.

## 2013-09-19 NOTE — Telephone Encounter (Signed)
Patient states that information did not go through to [facility]. Refaxed to [facility] [phone]

## 2013-09-19 NOTE — Telephone Encounter (Signed)
Patient called regarding his walking cane. Please advise.

## 2013-09-20 NOTE — Telephone Encounter (Signed)
Left message on home voicemail for the patient to return my call.

## 2013-09-20 NOTE — Telephone Encounter (Signed)
Patient called back stating that he is returning someone's phone call back. Please advise.

## 2013-09-20 NOTE — Telephone Encounter (Signed)
Left message for pt tot return my call

## 2013-09-20 NOTE — Telephone Encounter (Signed)
Not from this office

## 2013-09-20 NOTE — Telephone Encounter (Signed)
Spoke with patient and made aware that Rx for walking cane has already been faxed.

## 2013-10-20 ENCOUNTER — Ambulatory Visit: Payer: Medicare HMO | Attending: Internal Medicine | Admitting: Audiology

## 2013-10-20 DIAGNOSIS — H9190 Unspecified hearing loss, unspecified ear: Secondary | ICD-10-CM | POA: Insufficient documentation

## 2013-10-20 NOTE — Procedures (Signed)
   [place], [place]  [address] [address] EVALUATION  Patient Name: Sameul Tagle  Medical Record Number:  [id number] Date of Birth:  Sep 29, 1933     Date of Test:  10/20/2013  HISTORY:  Ethin Drummond, a  80 y.o. old male was seen for audiological evaluation upon referral of [name], MD.   The patient reported a gradual decrease of hearing on the right side for the past two years.  He lives with his son, who often reports that Mr. Redmann needs the volume elevated for the television.  Mr. Nettle denies difficulty with groups, children's voices or with the telephone. He feels his communication abilities are not hindered in any way.  Further, he does not report a familial history of hearing loss, tinnitus, fullness or dizziness.  There is one ear surgery in his history, during 1976, when his right ear drum burst after a bad cold and was patched by an ENT here in [place].  He does not recall the surgeon's name.  REPORT OF PAIN:  None  EVALUATION:   Air and bone conduction audiometry from 500Hz  - 8000Hz  utilizing standard  earphones revealed a slight to severe sensory neural loss on the right side and a mild to severe sensory neural loss on the left side from 3000Hz  - 8000Hz .   Speech reception thresholds were consistent with the pure tone results indicative of good test reliability.  Speech recognition testing was conducted in each ear independently, at a comfortable listening level (45-50dBHL) and indicated 72% and 72% in the right and left ears respectively.   Impedance audiometry was utilized and compliant middle ear systems were noted bilaterally. Acoustic reflexes were tested from 500Hz  - 4000Hz  and were persent on the right side and absent on the left side with ipsilateral stimulation.  Distortion Product Otoacoustic Emissions were tested from 2000Hz  - 10000Hz  and were absent on the right side and  absent on the left side, indicative of poor outer hair cell function within in the inner ear.  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  Speech discrimination skills are weak on both sides and are likely impacting his communication abilities.  RECOMMENDATIONS:    1. Hearing should be monitored at least on an annual basis and sooner should there be an increase in symptoms.  2. As the patient is quite comfortable with his hearing status; he is declining a hearing aid evaluation. 3. We discussed ways to improve his listening environment. Such as:  (A) When in a restaurant, sit with your back against a perimeter wall, thus reducing the extraneous noise.   (B) When in meetings, do not sit near an open doorway and position yourself within 8-10ft of the speaker. Front and center is typically best.  (C) Eliminate background noises at home such as the television, dishwasher, etc when having important conversations.  (D) Ask family members to face you when they speak and not to drop their heads or turn and walkway while still speaking.   4. Because the patient has a weakened auditory system he is more at risk from damage of noise exposure than someone with a healthy auditory system and therefore should avoid excessive nose exposure if possible or certainly utilize ear protection.     Ivonne Andrew Pugh, Au.D. Doctor of Audiology CCC-A

## 2013-10-20 NOTE — Patient Instructions (Signed)
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  Speech discrimination skills are weak on both sides and are likely impacting his communication abilities.  RECOMMENDATIONS:    1. Hearing should be monitored at least on an annual basis and sooner should there be an increase in symptoms.  2. As the patient is quite comfortable with his hearing status; he is declining a hearing aid evaluation. 3. We discussed ways to improve his listening environment. Such as:  (A) When in a restaurant, sit with your back against a perimeter wall, thus reducing the extraneous noise.   (B) When in meetings, do not sit near an open doorway and position yourself within 8-10ft of the speaker. Front and center is typically best.  (C) Eliminate background noises at home such as the television, dishwasher, etc when having important conversations.  (D) Ask family members to face you when they speak and not to drop their heads or turn and walkway while still speaking.   4. Because the patient has a weakened auditory system he is more at risk from damage of noise exposure than someone with a healthy auditory system and therefore should avoid excessive nose exposure if possible or certainly utilize ear protection.     Ivonne Andrew Pugh, Au.D. Doctor of Audiology CCC-A

## 2014-02-24 ENCOUNTER — Ambulatory Visit (INDEPENDENT_AMBULATORY_CARE_PROVIDER_SITE_OTHER): Payer: Commercial Managed Care - HMO | Admitting: Internal Medicine

## 2014-02-24 ENCOUNTER — Encounter: Payer: Self-pay | Admitting: Internal Medicine

## 2014-02-24 VITALS — BP 123/78 | HR 64 | Temp 97.6°F | Wt 152.2 lb

## 2014-02-24 DIAGNOSIS — E785 Hyperlipidemia, unspecified: Secondary | ICD-10-CM

## 2014-02-24 DIAGNOSIS — E119 Type 2 diabetes mellitus without complications: Secondary | ICD-10-CM

## 2014-02-24 DIAGNOSIS — M1711 Unilateral primary osteoarthritis, right knee: Secondary | ICD-10-CM

## 2014-02-24 LAB — LIPID PANEL
Cholesterol: 185 mg/dL (ref 0–200)
HDL: 41.2 mg/dL (ref >39.00)
LDL CALC: 126 mg/dL — AB (ref 0–99)
NonHDL: 143.8
TRIGLYCERIDES: 89 mg/dL (ref 0.0–149.0)
Total CHOL/HDL Ratio: 4
VLDL: 17.8 mg/dL (ref 0.0–40.0)

## 2014-02-24 LAB — HEMOGLOBIN A1C: HEMOGLOBIN A1C: 7.3 % — AB (ref 4.6–6.5)

## 2014-02-24 NOTE — Assessment & Plan Note (Signed)
Complained of right knee pain, limiting his lifestyle, refer to ortho

## 2014-02-24 NOTE — Progress Notes (Signed)
   Subjective:    Patient ID: Marco [name], male    DOB: Feb 09, 1934, 80 y.o.   MRN: [id number]  DOS:  02/24/2014 Type of visit - description : Followup from previous visit Interval history: Diabetes, continue w/ metformin, ambulatory blood sugars 110, 120 High cholesterol, did not increase Pravachol dose, see assessment and plan. DJD, complaining of right knee pain, limiting his ability to walk and exercise   ROS Denies chest pain or difficulty breathing No lower extremity edema. Meds reviewed,  on calcium and vitamin D  Past Medical History  Diagnosis Date  . Diabetes mellitus   . Elevated PSA   . Other and unspecified hyperlipidemia 04/25/2013  . DJD (degenerative joint disease) 08/25/2013    Past Surgical History  Procedure Laterality Date  . Cataract extraction      Bilaterally  . Tympanic membrane repair      R    History   Social History  . Marital Status: Married    Spouse Name: N/A    Number of Children: 3  . Years of Education: N/A   Occupational History  . Retired    Social History Main Topics  . Smoking status: Never Smoker   . Smokeless tobacco: Never Used  . Alcohol Use: No  . Drug Use: No  . Sexual Activity: Not on file   Other Topics Concern  . Not on file   Social History Narrative   Moved to the [place] area 2012     Original from [place] , Married , 2 daughters, 1 son          Medication List       This list is accurate as of: 02/24/14  8:43 AM.  Always use your most recent med list.               ACCU-CHEK AVIVA PLUS W/DEVICE Kit     ACCU-CHEK AVIVA Soln  Use to calibrate glucose machine as needed. Dx code: 250.00     accu-chek soft touch lancets  Use as instructed     glucose blood test strip  Use as instructed     metFORMIN 500 MG tablet  Commonly known as:  GLUCOPHAGE  Take 0.5 tablets (250 mg total) by mouth 2 (two) times daily with a meal.     pravastatin 40 MG tablet  Commonly known as:  PRAVACHOL  Take 1 tablet  (40 mg total) by mouth daily.     tadalafil 5 MG tablet  Commonly known as:  CIALIS  Take 1 tablet (5 mg total) by mouth daily as needed for erectile dysfunction.           Objective:   Physical Exam BP 123/78  Pulse 64  Temp(Src) 97.6 F (36.4 C) (Oral)  Wt 152 lb 4 oz (69.06 kg)  SpO2 99% General -- alert, well-developed, NAD.   Lungs -- normal respiratory effort, no intercostal retractions, no accessory muscle use, and normal breath sounds.  Heart-- normal rate, regular rhythm, no murmur.  Extremities-- no pretibial edema bilaterally ; knees  symmetric with changes of DJD, no effusion-swelling-redness. Neurologic--  alert & oriented X3.  Speech normal, gait Limited by DJD Psych-- Cognition and judgment appear intact. Cooperative with normal attention span and concentration. No anxious or depressed appearing.      Assessment & Plan:

## 2014-02-24 NOTE — Assessment & Plan Note (Signed)
Good medication compliance, continue with metformin  A1c

## 2014-02-24 NOTE — Progress Notes (Signed)
Pre visit review using our clinic review tool, if applicable. No additional management support is needed unless otherwise documented below in the visit note. 

## 2014-02-24 NOTE — Assessment & Plan Note (Signed)
Based on the last cholesterol panel, I recommended to increase Pravachol from 20 mg to 40 mg, pt didn't because he was not taking Pravachol consistently. Plan: FLP, further advice w/ results

## 2014-02-24 NOTE — Patient Instructions (Signed)
Get your blood work before you leave    Please come back to the office by 08-2014 for a physical exam. Come back fasting

## 2014-02-27 ENCOUNTER — Telehealth: Payer: Self-pay | Admitting: Internal Medicine

## 2014-02-27 MED ORDER — METFORMIN HCL 500 MG PO TABS: 500.0000 mg | ORAL_TABLET | Freq: Two times a day (BID) | ORAL | Status: DC

## 2014-02-27 MED ORDER — PRAVASTATIN SODIUM 40 MG PO TABS: 40.0000 mg | ORAL_TABLET | Freq: Every day | ORAL | Status: DC

## 2014-02-27 NOTE — Telephone Encounter (Signed)
Pt states he is returning your call.

## 2014-02-27 NOTE — Telephone Encounter (Signed)
Spoke with Pt, please see lab result notes.  

## 2014-02-27 NOTE — Addendum Note (Signed)
Addended by: Wilfrid Lund on: 02/27/2014 03:24 PM   Modules accepted: Orders, Medications

## 2014-03-16 DIAGNOSIS — M170 Bilateral primary osteoarthritis of knee: Secondary | ICD-10-CM | POA: Insufficient documentation

## 2014-03-17 ENCOUNTER — Telehealth: Payer: Self-pay | Admitting: Internal Medicine

## 2014-03-17 NOTE — Telephone Encounter (Signed)
Caller name: Kenney Houseman from [facility]--dr. Linton Rump Relation to pt: Call back number: [phone] fax: [phone] Pharmacy:  Reason for call:   Kenney Houseman wanting to know if patient needs to be seen for surgical clearance or if form can be filled out. She will be faxing form over.

## 2014-03-20 ENCOUNTER — Telehealth: Payer: Self-pay | Admitting: *Deleted

## 2014-03-20 NOTE — Telephone Encounter (Signed)
Form faxed to [facility] at [phone]. JG//CMA

## 2014-03-20 NOTE — Telephone Encounter (Signed)
Received surgery clearance form via fax from [facility]. Form forwarded to Dr. Larose Kells. JG//CMA

## 2014-03-20 NOTE — Telephone Encounter (Signed)
Form faxed to [facility] at [phone]. JG//CMA

## 2014-03-20 NOTE — Telephone Encounter (Signed)
Given to [employer] For documentation and faxing.

## 2014-03-20 NOTE — Telephone Encounter (Signed)
80 year old gentleman with reasonably controlled  chronic medical problems, has consistently denies chest pain or difficulty breathing. Plan: Okay for surgery as long as the  preop   labs and EKGs are okay

## 2014-04-19 ENCOUNTER — Other Ambulatory Visit: Payer: Self-pay

## 2014-04-19 MED ORDER — METFORMIN HCL 500 MG PO TABS: 500.0000 mg | ORAL_TABLET | Freq: Two times a day (BID) | ORAL | Status: DC

## 2014-05-24 ENCOUNTER — Telehealth: Payer: Self-pay | Admitting: Internal Medicine

## 2014-05-24 NOTE — Telephone Encounter (Signed)
The fax number is [phone]

## 2014-05-24 NOTE — Telephone Encounter (Signed)
Please prepare a letter: Mr. Saputo is a 81 year old gentleman with history of diabetes, is the standard of care  that all patients with diabetes get at least a yearly eye checkup

## 2014-05-24 NOTE — Telephone Encounter (Signed)
Faxed to correct number this time: [phone].

## 2014-05-24 NOTE — Telephone Encounter (Signed)
Fax confirmation failed to [phone].   Delsa Sale, do you have another fax number for [facility] plan, I looked on the back of his card and only the telephone number was listed.

## 2014-05-24 NOTE — Telephone Encounter (Signed)
Caller name:Selwyn Huntley Relationship to patient:self Can be reached:[phone] Pharmacy:  Reason for call: Pt is requesting note to insurance stating pt needs yearly eye exams, please fax to [phone]

## 2014-05-24 NOTE — Telephone Encounter (Signed)
Letter printed and faxed to [facility] at [phone] as requested.

## 2014-05-24 NOTE — Telephone Encounter (Signed)
Please advise 

## 2014-05-25 ENCOUNTER — Encounter: Payer: Self-pay | Admitting: Internal Medicine

## 2014-05-25 NOTE — Telephone Encounter (Signed)
Have been unable to fax letter requested by Pt to fax number given, tried 3 separate times. Letter and insurance cards printed and mailed to [facility] address on back on insurance cards.

## 2014-05-29 NOTE — Telephone Encounter (Signed)
I have never seen pt before. I did review his chart to assess his bp question. I think he needs to be seen. He needs to check his bp levels daily. So we can see his trend. How often his bp are increasing to moderate high levels. Hard to advise without understanding his bp level fluctuations. High and low values. Will send lpn note asking her to communicate this to pt.

## 2014-05-31 ENCOUNTER — Telehealth: Payer: Self-pay

## 2014-05-31 DIAGNOSIS — Z0100 Encounter for examination of eyes and vision without abnormal findings: Principal | ICD-10-CM

## 2014-05-31 DIAGNOSIS — E119 Type 2 diabetes mellitus without complications: Secondary | ICD-10-CM

## 2014-05-31 NOTE — Telephone Encounter (Signed)
I told yesterday nurse for eye doctor appointment of Dr Virgina Evener of [facility] in Feb. Her ph.no.[phone].   Patient needs referral for insurance as noted above.

## 2014-05-31 NOTE — Telephone Encounter (Signed)
Please proceed w/ the referral

## 2014-05-31 NOTE — Telephone Encounter (Signed)
Referral entered  

## 2014-06-13 ENCOUNTER — Other Ambulatory Visit: Payer: Self-pay

## 2014-06-13 MED ORDER — ACCU-CHEK SOFT TOUCH LANCETS MISC: Status: DC

## 2014-08-04 ENCOUNTER — Encounter: Payer: Commercial Managed Care - HMO | Admitting: Internal Medicine

## 2014-08-23 ENCOUNTER — Telehealth: Payer: Self-pay | Admitting: Internal Medicine

## 2014-08-23 NOTE — Telephone Encounter (Signed)
Pre Visit letter sent  °

## 2014-08-28 ENCOUNTER — Other Ambulatory Visit: Payer: Self-pay

## 2014-08-28 ENCOUNTER — Telehealth: Payer: Self-pay | Admitting: Internal Medicine

## 2014-08-28 DIAGNOSIS — E118 Type 2 diabetes mellitus with unspecified complications: Secondary | ICD-10-CM

## 2014-08-28 MED ORDER — ACCU-CHEK SOFT TOUCH LANCETS MISC: Status: DC

## 2014-08-28 MED ORDER — GLUCOSE BLOOD VI STRP: ORAL_STRIP | Status: DC

## 2014-08-28 MED ORDER — ACCU-CHEK AVIVA PLUS W/DEVICE KIT: PACK | Status: DC

## 2014-08-28 MED ORDER — ACCU-CHEK AVIVA VI SOLN: Status: DC

## 2014-08-28 NOTE — Telephone Encounter (Signed)
Relation to pt: self  Call back [phone] Pharmacy: [facility], [place] [phone] (Phone) [phone] (Fax)         Reason for call:  Pt requesting AccuCheck meter and diabetic supplies. Pt stated [employer] sent over fax on 08/29/14 and pt stated he lost meter.

## 2014-08-28 NOTE — Telephone Encounter (Signed)
Diabetic supplies already refilled to [facility] this morning(4/25), device sent to [facility] mail order.

## 2014-09-04 ENCOUNTER — Telehealth: Payer: Self-pay | Admitting: Internal Medicine

## 2014-09-04 DIAGNOSIS — M25569 Pain in unspecified knee: Secondary | ICD-10-CM

## 2014-09-04 NOTE — Telephone Encounter (Signed)
Caller name:Moseley Briceson Relation to WN:IOEV Call back [phone] Pharmacy:  Reason for call: pt states he is needed a referral to a orthopedic [facility]inton Rump, regarding his knee surgery, states dr. Larose Kells is aware. Pt has medicare/medicaid. Pt states he has an appt on  Wednesday to see the orthopedic doctor.

## 2014-09-04 NOTE — Telephone Encounter (Signed)
Referral placed.

## 2014-09-05 ENCOUNTER — Telehealth: Payer: Self-pay | Admitting: Internal Medicine

## 2014-09-05 DIAGNOSIS — E119 Type 2 diabetes mellitus without complications: Secondary | ICD-10-CM

## 2014-09-05 DIAGNOSIS — Z0100 Encounter for examination of eyes and vision without abnormal findings: Principal | ICD-10-CM

## 2014-09-05 NOTE — Telephone Encounter (Signed)
Caller name: Brandonlee Relation to pt: self Call back number: [phone] Pharmacy:  Reason for call:   Patient has appt with [facility] on 09/06/14. Dr. Deneen Harts. Needs [employer] referral. Looks like referral has already been placed just needs auth.

## 2014-09-05 NOTE — Telephone Encounter (Signed)
Referral placed.

## 2014-09-05 NOTE — Telephone Encounter (Signed)
Patient also needs auth for eye appt. [employer]. Does not have appt. Needs referral placed.

## 2014-09-06 ENCOUNTER — Encounter: Payer: Self-pay | Admitting: Internal Medicine

## 2014-09-13 ENCOUNTER — Telehealth: Payer: Self-pay | Admitting: *Deleted

## 2014-09-13 NOTE — Telephone Encounter (Signed)
Unable to reach patient at time of Pre-Visit Call.  Left message for patient to return call when available.    

## 2014-09-15 ENCOUNTER — Encounter: Payer: Self-pay | Admitting: Internal Medicine

## 2014-09-15 ENCOUNTER — Ambulatory Visit (INDEPENDENT_AMBULATORY_CARE_PROVIDER_SITE_OTHER): Payer: Commercial Managed Care - HMO | Admitting: Internal Medicine

## 2014-09-15 ENCOUNTER — Other Ambulatory Visit: Payer: Self-pay

## 2014-09-15 VITALS — BP 122/72 | HR 56 | Temp 97.7°F | Ht 68.0 in | Wt 141.5 lb

## 2014-09-15 DIAGNOSIS — E119 Type 2 diabetes mellitus without complications: Secondary | ICD-10-CM

## 2014-09-15 DIAGNOSIS — E785 Hyperlipidemia, unspecified: Secondary | ICD-10-CM | POA: Diagnosis not present

## 2014-09-15 DIAGNOSIS — M1711 Unilateral primary osteoarthritis, right knee: Secondary | ICD-10-CM

## 2014-09-15 DIAGNOSIS — M8580 Other specified disorders of bone density and structure, unspecified site: Secondary | ICD-10-CM

## 2014-09-15 DIAGNOSIS — Z0000 Encounter for general adult medical examination without abnormal findings: Secondary | ICD-10-CM | POA: Diagnosis not present

## 2014-09-15 DIAGNOSIS — H9190 Unspecified hearing loss, unspecified ear: Secondary | ICD-10-CM

## 2014-09-15 DIAGNOSIS — M810 Age-related osteoporosis without current pathological fracture: Secondary | ICD-10-CM | POA: Insufficient documentation

## 2014-09-15 LAB — LIPID PANEL
Cholesterol: 158 mg/dL (ref 0–200)
HDL: 45.7 mg/dL (ref >39.00)
LDL CALC: 90 mg/dL (ref 0–99)
NonHDL: 112.3
Total CHOL/HDL Ratio: 3
Triglycerides: 113 mg/dL (ref 0.0–149.0)
VLDL: 22.6 mg/dL (ref 0.0–40.0)

## 2014-09-15 LAB — BASIC METABOLIC PANEL
BUN: 15 mg/dL (ref 6–23)
CHLORIDE: 98 meq/L (ref 96–112)
CO2: 31 mEq/L (ref 19–32)
Calcium: 10.1 mg/dL (ref 8.4–10.5)
Creatinine, Ser: 1.01 mg/dL (ref 0.40–1.50)
GFR: 75.67 mL/min (ref >60.00)
Glucose, Bld: 131 mg/dL — ABNORMAL HIGH (ref 70–99)
Potassium: 4.7 mEq/L (ref 3.5–5.1)
Sodium: 134 mEq/L — ABNORMAL LOW (ref 135–145)

## 2014-09-15 LAB — MICROALBUMIN / CREATININE URINE RATIO
Creatinine,U: 126.7 mg/dL
MICROALB UR: 1.8 mg/dL (ref 0.0–1.9)
Microalb Creat Ratio: 1.4 mg/g (ref 0.0–30.0)

## 2014-09-15 LAB — ALT: ALT: 16 U/L (ref 0–53)

## 2014-09-15 LAB — HEMOGLOBIN A1C: Hgb A1c MFr Bld: 7.1 % — ABNORMAL HIGH (ref 4.6–6.5)

## 2014-09-15 LAB — AST: AST: 24 U/L (ref 0–37)

## 2014-09-15 MED ORDER — METFORMIN HCL 500 MG PO TABS: 500.0000 mg | ORAL_TABLET | Freq: Two times a day (BID) | ORAL | Status: DC

## 2014-09-15 MED ORDER — PRAVASTATIN SODIUM 40 MG PO TABS: 40.0000 mg | ORAL_TABLET | Freq: Every day | ORAL | Status: DC

## 2014-09-15 NOTE — Assessment & Plan Note (Addendum)
Continue with knee pain, already referred to Dr. Linton Rump; patient is exploring the option of a knee replacement, if that is the case he may need a stress test ahead of the surgery.

## 2014-09-15 NOTE — Assessment & Plan Note (Signed)
Declined all immunizations Reports a previous colonoscopy, no documentation, he was told it was negative. Today he told me he is not interested on further screening.   Diet and exercise discussed

## 2014-09-15 NOTE — Assessment & Plan Note (Addendum)
Bone density test 09/2013 showed osteopenia, recommend calcium and vitamin D. Taking Fosamax for osteoporosis prevention as an option was discussed with the patient.

## 2014-09-15 NOTE — Progress Notes (Signed)
Pre visit review using our clinic review tool, if applicable. No additional management support is needed unless otherwise documented below in the visit note. 

## 2014-09-15 NOTE — Progress Notes (Signed)
Subjective:    Patient ID: Marco [name], male    DOB: 09-11-1933, 81 y.o.   MRN: [id number]  DOS:  09/15/2014 Type of visit - description :   Here for Medicare AWV:  1. Risk factors based on Past M, S, F history: reviewed 2. Physical Activities: Does some walking  3. Depression/mood: neg screening  4. Hearing: saw audiology 2015, was not rx a hearing aid per pt  5. ADL's: Independent, not driving by choice 6. Fall Risk: no recent falls, see instructions  7. home Safety: does feel safe at home  8. Height, weight, & visual acuity: see VS, vision decreased R , has a ov schedule to see the eye doctor  9. Counseling: provided 10. Labs ordered based on risk factors: if needed  11. Referral Coordination: if needed 12. Care Plan, see assessment and plan , written plan provided , see AVS 13. Cognitive Assessment: cognition and motor appropriate for age  8, care team updated -- does not see other Mds regulalrly  15. End of life care discussed , info provided   In addition, today we discussed the following: Diabetes, good compliance of medication, ambulatory blood sugars around 105, 129 morning High cholesterol, good compliance with medications.   Review of Systems  Constitutional: No fever, chills. No unexplained wt changes. No unusual sweats HEENT: No dental problems, ear discharge, facial swelling, voice changes. No eye discharge, redness or intolerance to light Respiratory: No wheezing or difficulty breathing. No cough , mucus production Cardiovascular: No CP, leg swelling or palpitations GI: no nausea, vomiting, diarrhea or abdominal pain.  No blood in the stools. No dysphagia   Endocrine: No polyphagia, polyuria or polydipsia GU: No dysuria, gross hematuria, difficulty urinating. No urinary urgency or frequency. Musculoskeletal:  Continue with knee pain, already referred to orthopedic surgery. Skin: No change in the color of the skin, palor or rash Allergic,  immunologic: No environmental allergies or food allergies Neurological: No dizziness or syncope. No headaches. No diplopia, slurred speech, motor deficits, facial numbness Hematological: No enlarged lymph nodes, easy bruising or bleeding Psychiatry: No suicidal ideas, hallucinations, behavior problems or confusion. No unusual/severe anxiety or depression.    Past Medical History  Diagnosis Date  . Diabetes mellitus   . Elevated PSA   . Other and unspecified hyperlipidemia 04/25/2013  . DJD (degenerative joint disease) 08/25/2013    Past Surgical History  Procedure Laterality Date  . Cataract extraction      Bilaterally  . Tympanic membrane repair      R    History   Social History  . Marital Status: Married    Spouse Name: N/A  . Number of Children: 3  . Years of Education: N/A   Occupational History  . Retired    Social History Main Topics  . Smoking status: Never Smoker   . Smokeless tobacco: Never Used  . Alcohol Use: No  . Drug Use: No  . Sexual Activity: Not on file   Other Topics Concern  . Not on file   Social History Narrative   Moved to the [place] area 2012     Original from [place] , Married     Lives w/ wife, son and his family    2 daughters, 1 son          Medication List       This list is accurate as of: 09/15/14 11:59 PM.  Always use your most recent med list.  ACCU-CHEK AVIVA PLUS W/DEVICE Kit  Use device to check blood sugar as directed.     ACCU-CHEK AVIVA Soln  Use to calibrate glucose machine as needed.     accu-chek soft touch lancets  Check blood sugar no more than twice daily.     aspirin 81 MG tablet  Take 81 mg by mouth daily.     glucose blood test strip  Check blood sugar no more than twice daily.     metFORMIN 500 MG tablet  Commonly known as:  GLUCOPHAGE  Take 1 tablet (500 mg total) by mouth 2 (two) times daily with a meal.     pravastatin 40 MG tablet  Commonly known as:  PRAVACHOL  Take 1 tablet  (40 mg total) by mouth daily.     tadalafil 5 MG tablet  Commonly known as:  CIALIS  Take 1 tablet (5 mg total) by mouth daily as needed for erectile dysfunction.     VITAMIN B12 PO  Take 1 tablet by mouth daily.           Objective:   Physical Exam BP 122/72 mmHg  Pulse 56  Temp(Src) 97.7 F (36.5 C) (Oral)  Ht '5\' 8"'  (1.727 m)  Wt 141 lb 8 oz (64.184 kg)  BMI 21.52 kg/m2  SpO2 98%  General:   Well developed, well nourished . NAD.  HEENT:  Normocephalic . Face symmetric, atraumatic. No thyromegaly Lungs:  CTA B Normal respiratory effort, no intercostal retractions, no accessory muscle use. Heart: RRR,  no murmur.  no pretibial edema bilaterally  Abdomen:  Not distended, soft, non-tender. No rebound or rigidity. No mass,organomegaly, no bruit Skin: Not pale. Not jaundice Neurologic:  alert & oriented X3.  Speech normal, gait appropriate for age and unassisted Psych--  Cognition and judgment appear intact.  Cooperative with normal attention span and concentration.  Behavior appropriate. No anxious or depressed appearing.      Assessment & Plan:

## 2014-09-15 NOTE — Assessment & Plan Note (Signed)
BASIC done last A1c, metformin dose increased, check A1c

## 2014-09-15 NOTE — Assessment & Plan Note (Signed)
Base on last cholesterol panel, pravastatin dose was increased, good compliance, check FLP, AST, ALT

## 2014-09-15 NOTE — Patient Instructions (Signed)
Get your blood work before you leave      Come back to the office in 6 months   for a routine check up     Fall Prevention and Home Safety Falls cause injuries and can affect all age groups. It is possible to use preventive measures to significantly decrease the likelihood of falls. There are many simple measures which can make your home safer and prevent falls. OUTDOORS  Repair cracks and edges of walkways and driveways.  Remove high doorway thresholds.  Trim shrubbery on the main path into your home.  Have good outside lighting.  Clear walkways of tools, rocks, debris, and clutter.  Check that handrails are not broken and are securely fastened. Both sides of steps should have handrails.  Have leaves, snow, and ice cleared regularly.  Use sand or salt on walkways during winter months.  In the garage, clean up grease or oil spills. BATHROOM  Install night lights.  Install grab bars by the toilet and in the tub and shower.  Use non-skid mats or decals in the tub or shower.  Place a plastic non-slip stool in the shower to sit on, if needed.  Keep floors dry and clean up all water on the floor immediately.  Remove soap buildup in the tub or shower on a regular basis.  Secure bath mats with non-slip, double-sided rug tape.  Remove throw rugs and tripping hazards from the floors. BEDROOMS  Install night lights.  Make sure a bedside light is easy to reach.  Do not use oversized bedding.  Keep a telephone by your bedside.  Have a firm chair with side arms to use for getting dressed.  Remove throw rugs and tripping hazards from the floor. KITCHEN  Keep handles on pots and pans turned toward the center of the stove. Use back burners when possible.  Clean up spills quickly and allow time for drying.  Avoid walking on wet floors.  Avoid hot utensils and knives.  Position shelves so they are not too high or low.  Place commonly used objects within easy  reach.  If necessary, use a sturdy step stool with a grab bar when reaching.  Keep electrical cables out of the way.  Do not use floor polish or wax that makes floors slippery. If you must use wax, use non-skid floor wax.  Remove throw rugs and tripping hazards from the floor. STAIRWAYS  Never leave objects on stairs.  Place handrails on both sides of stairways and use them. Fix any loose handrails. Make sure handrails on both sides of the stairways are as long as the stairs.  Check carpeting to make sure it is firmly attached along stairs. Make repairs to worn or loose carpet promptly.  Avoid placing throw rugs at the top or bottom of stairways, or properly secure the rug with carpet tape to prevent slippage. Get rid of throw rugs, if possible.  Have an electrician put in a light switch at the top and bottom of the stairs. OTHER FALL PREVENTION TIPS  Wear low-heel or rubber-soled shoes that are supportive and fit well. Wear closed toe shoes.  When using a stepladder, make sure it is fully opened and both spreaders are firmly locked. Do not climb a closed stepladder.  Add color or contrast paint or tape to grab bars and handrails in your home. Place contrasting color strips on first and last steps.  Learn and use mobility aids as needed. Install an electrical emergency response system.  Turn   on lights to avoid dark areas. Replace light bulbs that burn out immediately. Get light switches that glow.  Arrange furniture to create clear pathways. Keep furniture in the same place.  Firmly attach carpet with non-skid or double-sided tape.  Eliminate uneven floor surfaces.  Select a carpet pattern that does not visually hide the edge of steps.  Be aware of all pets. OTHER HOME SAFETY TIPS  Set the water temperature for 120 F (48.8 C).  Keep emergency numbers on or near the telephone.  Keep smoke detectors on every level of the home and near sleeping areas. Document Released:  04/11/2002 Document Revised: 10/21/2011 Document Reviewed: 07/11/2011 ExitCare Patient Information 2015 Exit[employer]. This information is not intended to replace advice given to you by your health care provider. Make sure you discuss any questions you have with your health care provider.   Preventive Care for Adults Ages 65 and over  Blood pressure check.** / Every 1 to 2 years.  Lipid and cholesterol check.**/ Every 5 years beginning at age 20.  Lung cancer screening. / Every year if you are aged 55-80 years and have a 30-pack-year history of smoking and currently smoke or have quit within the past 15 years. Yearly screening is stopped once you have quit smoking for at least 15 years or develop a health problem that would prevent you from having lung cancer treatment.  Fecal occult blood test (FOBT) of stool. / Every year beginning at age 50 and continuing until age 75. You may not have to do this test if you get a colonoscopy every 10 years.  Flexible sigmoidoscopy** or colonoscopy.** / Every 5 years for a flexible sigmoidoscopy or every 10 years for a colonoscopy beginning at age 50 and continuing until age 75.  Hepatitis C blood test.** / For all people born from 1945 through 1965 and any individual with known risks for hepatitis C.  Abdominal aortic aneurysm (AAA) screening.** / A one-time screening for ages 65 to 75 years who are current or former smokers.  Skin self-exam. / Monthly.  Influenza vaccine. / Every year.  Tetanus, diphtheria, and acellular pertussis (Tdap/Td) vaccine.** / 1 dose of Td every 10 years.  Varicella vaccine.** / Consult your health care provider.  Zoster vaccine.** / 1 dose for adults aged 60 years or older.  Pneumococcal 13-valent conjugate (PCV13) vaccine.** / Consult your health care provider.  Pneumococcal polysaccharide (PPSV23) vaccine.** / 1 dose for all adults aged 65 years and older.  Meningococcal vaccine.** / Consult your health care  provider.  Hepatitis A vaccine.** / Consult your health care provider.  Hepatitis B vaccine.** / Consult your health care provider.  Haemophilus influenzae type b (Hib) vaccine.** / Consult your health care provider. **Family history and personal history of risk and conditions may change your health care provider's recommendations. Document Released: 06/17/2001 Document Revised: 04/26/2013 Document Reviewed: 09/16/2010 ExitCare Patient Information 2015 ExitCare, [employer]. This information is not intended to replace advice given to you by your health care provider. Make sure you discuss any questions you have with your health care provider.   

## 2014-09-15 NOTE — Assessment & Plan Note (Addendum)
Saw audiology last year according to the patient

## 2014-09-18 ENCOUNTER — Encounter: Payer: Self-pay | Admitting: Internal Medicine

## 2014-09-18 ENCOUNTER — Telehealth: Payer: Self-pay | Admitting: Internal Medicine

## 2014-09-18 DIAGNOSIS — M170 Bilateral primary osteoarthritis of knee: Secondary | ICD-10-CM

## 2014-09-18 MED ORDER — SITAGLIPTIN PHOSPHATE 100 MG PO TABS: 100.0000 mg | ORAL_TABLET | Freq: Every day | ORAL | Status: DC

## 2014-09-18 NOTE — Telephone Encounter (Signed)
Okay to print a prescription, DX knee DJD

## 2014-09-18 NOTE — Telephone Encounter (Signed)
Pt requesting orders for Cane. Okay to place orders or should ortho?

## 2014-09-18 NOTE — Telephone Encounter (Signed)
DME orders printed, awaiting MD signature.

## 2014-09-18 NOTE — Telephone Encounter (Signed)
Caller: Jeanmarie Plant w/ [employer] Ph#: [phone]  Pt needs order for standard cane sent to DME provider.

## 2014-09-18 NOTE — Telephone Encounter (Signed)
Cane orders faxed to [facility] at [phone].

## 2014-09-18 NOTE — Addendum Note (Signed)
Addended by: Wilfrid Lund on: 09/18/2014 08:19 AM   Modules accepted: Orders

## 2014-09-19 ENCOUNTER — Encounter: Payer: Self-pay | Admitting: Internal Medicine

## 2014-10-04 ENCOUNTER — Telehealth: Payer: Self-pay | Admitting: Internal Medicine

## 2014-10-04 ENCOUNTER — Encounter: Payer: Self-pay | Admitting: Internal Medicine

## 2014-10-04 NOTE — Telephone Encounter (Signed)
Caller name:dahyahai Relationship to patient self  Can be reached:[phone]  Pharmacy:  Reason for call:please call the patient

## 2014-10-04 NOTE — Telephone Encounter (Signed)
Received MyChart message from Pt regarding his ortho referral to Dr. Linton Rump. Pt informed us that Dr. Linton Rump' office had not received referral. I informed Pt that the referral to Dr. Linton Rump was ordered on Sep 04, 2014 and authorization number is [id number].

## 2014-10-12 DIAGNOSIS — M1711 Unilateral primary osteoarthritis, right knee: Secondary | ICD-10-CM | POA: Diagnosis not present

## 2014-11-29 DIAGNOSIS — Z961 Presence of intraocular lens: Secondary | ICD-10-CM | POA: Diagnosis not present

## 2014-11-29 DIAGNOSIS — H524 Presbyopia: Secondary | ICD-10-CM | POA: Diagnosis not present

## 2014-11-29 DIAGNOSIS — E119 Type 2 diabetes mellitus without complications: Secondary | ICD-10-CM | POA: Diagnosis not present

## 2014-11-29 DIAGNOSIS — H52223 Regular astigmatism, bilateral: Secondary | ICD-10-CM | POA: Diagnosis not present

## 2014-11-29 DIAGNOSIS — H5213 Myopia, bilateral: Secondary | ICD-10-CM | POA: Diagnosis not present

## 2014-11-29 LAB — HM DIABETES EYE EXAM

## 2014-12-20 DIAGNOSIS — Z0100 Encounter for examination of eyes and vision without abnormal findings: Secondary | ICD-10-CM | POA: Diagnosis not present

## 2015-01-02 ENCOUNTER — Telehealth: Payer: Self-pay | Admitting: Internal Medicine

## 2015-01-02 NOTE — Telephone Encounter (Signed)
Relation to KM:QKMM Call back number: [phone]   Reason for call: patient was seen at [facility] July 27th 2016. [employer] is requesting a referral (f) [phone]

## 2015-01-03 NOTE — Telephone Encounter (Signed)
Referral was done on 10/03/14 to Dr Bing Plume, Josem Kaufmann # [id number], good from 10/03/14-03/05/15 or 6 visits which ever comes first

## 2015-01-07 DIAGNOSIS — L989 Disorder of the skin and subcutaneous tissue, unspecified: Secondary | ICD-10-CM | POA: Diagnosis not present

## 2015-01-07 DIAGNOSIS — W540XXA Bitten by dog, initial encounter: Secondary | ICD-10-CM | POA: Diagnosis not present

## 2015-02-21 ENCOUNTER — Encounter: Payer: Self-pay | Admitting: Internal Medicine

## 2015-03-01 ENCOUNTER — Encounter: Payer: Self-pay | Admitting: Internal Medicine

## 2015-03-06 ENCOUNTER — Ambulatory Visit: Payer: Commercial Managed Care - HMO | Admitting: Internal Medicine

## 2015-03-08 ENCOUNTER — Encounter: Payer: Self-pay | Admitting: Internal Medicine

## 2015-03-08 ENCOUNTER — Ambulatory Visit (INDEPENDENT_AMBULATORY_CARE_PROVIDER_SITE_OTHER): Payer: Commercial Managed Care - HMO | Admitting: Internal Medicine

## 2015-03-08 ENCOUNTER — Other Ambulatory Visit: Payer: Self-pay | Admitting: Internal Medicine

## 2015-03-08 VITALS — BP 118/74 | HR 49 | Temp 97.6°F | Ht 68.0 in | Wt 144.1 lb

## 2015-03-08 DIAGNOSIS — E785 Hyperlipidemia, unspecified: Secondary | ICD-10-CM

## 2015-03-08 DIAGNOSIS — E119 Type 2 diabetes mellitus without complications: Secondary | ICD-10-CM

## 2015-03-08 DIAGNOSIS — Z09 Encounter for follow-up examination after completed treatment for conditions other than malignant neoplasm: Secondary | ICD-10-CM | POA: Insufficient documentation

## 2015-03-08 LAB — BASIC METABOLIC PANEL
BUN: 19 mg/dL (ref 6–23)
CALCIUM: 9.5 mg/dL (ref 8.4–10.5)
CHLORIDE: 107 meq/L (ref 96–112)
CO2: 27 meq/L (ref 19–32)
Creatinine, Ser: 1.06 mg/dL (ref 0.40–1.50)
GFR: 71.48 mL/min (ref >60.00)
Glucose, Bld: 131 mg/dL — ABNORMAL HIGH (ref 70–99)
Potassium: 4.4 mEq/L (ref 3.5–5.1)
SODIUM: 142 meq/L (ref 135–145)

## 2015-03-08 LAB — HM DIABETES FOOT EXAM: HM DIABETIC FOOT EXAM: NORMAL

## 2015-03-08 LAB — HEMOGLOBIN A1C: Hgb A1c MFr Bld: 6.6 % — ABNORMAL HIGH (ref 4.6–6.5)

## 2015-03-08 MED ORDER — SILDENAFIL CITRATE 20 MG PO TABS: 40.0000 mg | ORAL_TABLET | Freq: Every day | ORAL | Status: DC | PRN

## 2015-03-08 NOTE — Progress Notes (Signed)
Pre visit review using our clinic review tool, if applicable. No additional management support is needed unless otherwise documented below in the visit note. 

## 2015-03-08 NOTE — Assessment & Plan Note (Signed)
DM: Takes metformin on Januvia in alternating days, reluctant to take both the same day. Likes to see how the A1c is doing. Also complains of paresthesias in the foot, likely due to neuropathy, pinprick examination normal, educated about appropriate feet care. Check A1c Dyslipidemia: last FLP satisfactory ED: Change Cialis to sildanefil d/t cost, discussed how to take it an potential s/e. Primary care-- declined a flu shot  RTC 6 months for a CPX

## 2015-03-08 NOTE — Patient Instructions (Addendum)
Get your blood work before you leave     Next visit  for a   physical exam in 6 months Please schedule an appointment at the front desk Please come back fasting    Diabetes and Foot Care Diabetes may cause you to have problems because of poor blood supply (circulation) to your feet and legs. This may cause the skin on your feet to become thinner, break easier, and heal more slowly. Your skin may become dry, and the skin may peel and crack. You may also have nerve damage in your legs and feet causing decreased feeling in them. You may not notice minor injuries to your feet that could lead to infections or more serious problems. Taking care of your feet is one of the most important things you can do for yourself.  HOME CARE INSTRUCTIONS  Wear shoes at all times, even in the house. Do not go barefoot. Bare feet are easily injured.  Check your feet daily for blisters, cuts, and redness. If you cannot see the bottom of your feet, use a mirror or ask someone for help.  Wash your feet with warm water (do not use hot water) and mild soap. Then pat your feet and the areas between your toes until they are completely dry. Do not soak your feet as this can dry your skin.  Apply a moisturizing lotion or petroleum jelly (that does not contain alcohol and is unscented) to the skin on your feet and to dry, brittle toenails. Do not apply lotion between your toes.  Trim your toenails straight across. Do not dig under them or around the cuticle. File the edges of your nails with an emery board or nail file.  Do not cut corns or calluses or try to remove them with medicine.  Wear clean socks or stockings every day. Make sure they are not too tight. Do not wear knee-high stockings since they may decrease blood flow to your legs.  Wear shoes that fit properly and have enough cushioning. To break in new shoes, wear them for just a few hours a day. This prevents you from injuring your feet. Always look in your  shoes before you put them on to be sure there are no objects inside.  Do not cross your legs. This may decrease the blood flow to your feet.  If you find a minor scrape, cut, or break in the skin on your feet, keep it and the skin around it clean and dry. These areas may be cleansed with mild soap and water. Do not cleanse the area with peroxide, alcohol, or iodine.  When you remove an adhesive bandage, be sure not to damage the skin around it.  If you have a wound, look at it several times a day to make sure it is healing.  Do not use heating pads or hot water bottles. They may burn your skin. If you have lost feeling in your feet or legs, you may not know it is happening until it is too late.  Make sure your health care provider performs a complete foot exam at least annually or more often if you have foot problems. Report any cuts, sores, or bruises to your health care provider immediately. SEEK MEDICAL CARE IF:   You have an injury that is not healing.  You have cuts or breaks in the skin.  You have an ingrown nail.  You notice redness on your legs or feet.  You feel burning or tingling in your  legs or feet.  You have pain or cramps in your legs and feet.  Your legs or feet are numb.  Your feet always feel cold. SEEK IMMEDIATE MEDICAL CARE IF:   There is increasing redness, swelling, or pain in or around a wound.  There is a red line that goes up your leg.  Pus is coming from a wound.  You develop a fever or as directed by your health care provider.  You notice a bad smell coming from an ulcer or wound.   This information is not intended to replace advice given to you by your health care provider. Make sure you discuss any questions you have with your health care provider.   Document Released: 04/18/2000 Document Revised: 12/22/2012 Document Reviewed: 09/28/2012 Elsevier Interactive Patient Education [employer].

## 2015-03-08 NOTE — Progress Notes (Signed)
Subjective:    Patient ID: Marco [name], male    DOB: Mar 07, 1934, 81 y.o.   MRN: [id number]  DOS:  03/08/2015 Type of visit - description : Routine office visit Interval history: DM: States he alterantes taking Januvia and metformin qod, reluctant to take both medications the same day. Likes to see how his A1c is doing. ED: Cialis is very expensive, alternative? DJD: Still having pain, getting  local injections at the knee ---> some help High cholesterol: Good compliance of medication   Review of Systems Denies chest pain or difficulty breathing No nausea, vomiting, diarrhea or blood in the stools. Admits to numbness at the plantar area mostly at night. Not severe. No painful paresthesias.  Past Medical History  Diagnosis Date  . Diabetes mellitus   . Elevated PSA   . Other and unspecified hyperlipidemia 04/25/2013  . DJD (degenerative joint disease) 08/25/2013    Past Surgical History  Procedure Laterality Date  . Cataract extraction      Bilaterally  . Tympanic membrane repair      R    Social History   Social History  . Marital Status: Married    Spouse Name: N/A  . Number of Children: 3  . Years of Education: N/A   Occupational History  . Retired    Social History Main Topics  . Smoking status: Never Smoker   . Smokeless tobacco: Never Used  . Alcohol Use: No  . Drug Use: No  . Sexual Activity: Not on file   Other Topics Concern  . Not on file   Social History Narrative   Moved to the [place] area 2012     Original from [place] , Married     Lives w/ wife, son and his family    2 daughters, 1 son          Medication List       This list is accurate as of: 03/08/15  8:15 PM.  Always use your most recent med list.               ACCU-CHEK AVIVA PLUS W/DEVICE Kit  Use device to check blood sugar as directed.     ACCU-CHEK AVIVA Soln  Use to calibrate glucose machine as needed.     accu-chek soft touch lancets  Check blood sugar no more than  twice daily.     aspirin 81 MG tablet  Take 81 mg by mouth daily.     glucose blood test strip  Check blood sugar no more than twice daily.     metFORMIN 500 MG tablet  Commonly known as:  GLUCOPHAGE  Take 1 tablet (500 mg total) by mouth 2 (two) times daily with a meal.     pravastatin 40 MG tablet  Commonly known as:  PRAVACHOL  Take 1 tablet (40 mg total) by mouth daily.     sildenafil 20 MG tablet  Commonly known as:  REVATIO  Take 2-3 tablets (40-60 mg total) by mouth daily as needed.     sitaGLIPtin 100 MG tablet  Commonly known as:  JANUVIA  Take 1 tablet (100 mg total) by mouth daily.     tadalafil 5 MG tablet  Commonly known as:  CIALIS  Take 1 tablet (5 mg total) by mouth daily as needed for erectile dysfunction.     VITAMIN B12 PO  Take 1 tablet by mouth daily.           Objective:   Physical  Exam BP 118/74 mmHg  Pulse 49  Temp(Src) 97.6 F (36.4 C) (Oral)  Ht 5' 8" (1.727 m)  Wt 144 lb 2 oz (65.375 kg)  BMI 21.92 kg/m2  SpO2 98% General:   Well developed, well nourished . NAD.  HEENT:  Normocephalic . Face symmetric, atraumatic Lungs:  CTA B Normal respiratory effort, no intercostal retractions, no accessory muscle use. Heart: RRR,  no murmur.  No pretibial edema bilaterally  Diabetic feet exam: Normal pedal pulses, pinprick examination normal. Skin normal Neurologic:  alert & oriented X3.  Speech normal, gait appropriate for age and unassisted Psych--  Cognition and judgment appear intact.  Cooperative with normal attention span and concentration.  Behavior appropriate. No anxious or depressed appearing.      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 Likes to see how the A1c is doing. Also complains of paresthesias in the foot, likely due to neuropathy, pinprick examination normal, educated about appropriate feet care. Check A1c Dyslipidemia: last FLP satisfactory ED: Change Cialis to sildanefil d/t cost, discussed how to take it an potential s/e. Primary care-- declined a flu shot  RTC 6 months for a CPX

## 2015-03-09 NOTE — Telephone Encounter (Signed)
Pt is requesting refill on Januvia 100mg . A1c on 03/08/2015 was 6.6 % and fasting glucose 131. Please advise.

## 2015-03-10 NOTE — Telephone Encounter (Signed)
Okay to refill for 6 months 

## 2015-03-12 ENCOUNTER — Encounter: Payer: Self-pay | Admitting: Internal Medicine

## 2015-03-20 ENCOUNTER — Ambulatory Visit: Payer: Commercial Managed Care - HMO | Admitting: Internal Medicine

## 2015-05-06 DIAGNOSIS — G453 Amaurosis fugax: Secondary | ICD-10-CM

## 2015-05-06 HISTORY — DX: Amaurosis fugax: G45.3

## 2015-09-12 ENCOUNTER — Encounter: Payer: Self-pay | Admitting: *Deleted

## 2015-09-12 ENCOUNTER — Telehealth: Payer: Self-pay | Admitting: *Deleted

## 2015-09-12 NOTE — Telephone Encounter (Signed)
Pre-Visit Call completed with patient and chart updated.   Pre-Visit Info documented in Specialty Comments under [employer].    

## 2015-09-13 ENCOUNTER — Telehealth: Payer: Self-pay | Admitting: Internal Medicine

## 2015-09-13 ENCOUNTER — Encounter: Payer: Self-pay | Admitting: Internal Medicine

## 2015-09-13 ENCOUNTER — Ambulatory Visit (INDEPENDENT_AMBULATORY_CARE_PROVIDER_SITE_OTHER): Payer: Commercial Managed Care - HMO | Admitting: Internal Medicine

## 2015-09-13 VITALS — BP 116/62 | HR 52 | Temp 97.6°F | Ht 68.0 in | Wt 144.0 lb

## 2015-09-13 DIAGNOSIS — Z09 Encounter for follow-up examination after completed treatment for conditions other than malignant neoplasm: Secondary | ICD-10-CM

## 2015-09-13 DIAGNOSIS — Z0000 Encounter for general adult medical examination without abnormal findings: Secondary | ICD-10-CM | POA: Diagnosis not present

## 2015-09-13 DIAGNOSIS — E118 Type 2 diabetes mellitus with unspecified complications: Secondary | ICD-10-CM | POA: Diagnosis not present

## 2015-09-13 DIAGNOSIS — E785 Hyperlipidemia, unspecified: Secondary | ICD-10-CM

## 2015-09-13 DIAGNOSIS — E119 Type 2 diabetes mellitus without complications: Secondary | ICD-10-CM

## 2015-09-13 DIAGNOSIS — Z0100 Encounter for examination of eyes and vision without abnormal findings: Principal | ICD-10-CM

## 2015-09-13 LAB — LIPID PANEL
CHOL/HDL RATIO: 5
Cholesterol: 189 mg/dL (ref 0–200)
HDL: 39.3 mg/dL (ref >39.00)
LDL CALC: 135 mg/dL — AB (ref 0–99)
NONHDL: 150.18
Triglycerides: 75 mg/dL (ref 0.0–149.0)
VLDL: 15 mg/dL (ref 0.0–40.0)

## 2015-09-13 LAB — BASIC METABOLIC PANEL
BUN: 19 mg/dL (ref 6–23)
CALCIUM: 9.5 mg/dL (ref 8.4–10.5)
CO2: 28 mEq/L (ref 19–32)
CREATININE: 1.09 mg/dL (ref 0.40–1.50)
Chloride: 104 mEq/L (ref 96–112)
GFR: 69.12 mL/min (ref >60.00)
Glucose, Bld: 122 mg/dL — ABNORMAL HIGH (ref 70–99)
Potassium: 4.9 mEq/L (ref 3.5–5.1)
Sodium: 140 mEq/L (ref 135–145)

## 2015-09-13 LAB — MICROALBUMIN / CREATININE URINE RATIO
Creatinine,U: 147 mg/dL
MICROALB UR: 1.4 mg/dL (ref 0.0–1.9)
Microalb Creat Ratio: 1 mg/g (ref 0.0–30.0)

## 2015-09-13 LAB — HEMOGLOBIN A1C: HEMOGLOBIN A1C: 7 % — AB (ref 4.6–6.5)

## 2015-09-13 NOTE — Patient Instructions (Addendum)
GO TO THE LAB : Get the blood work     GO TO THE FRONT DESK Schedule your next appointment for a  routine checkup in 6 months  Please  bring a translator for your next visit   Take your cholesterol medications daily  Please consider visit these websites for more information about the healthcare power of attorney: ---[email or web address] ---[email or web address]    Fall Prevention and Home Safety Falls cause injuries and can affect all age groups. It is possible to use preventive measures to significantly decrease the likelihood of falls. There are many simple measures which can make your home safer and prevent falls. OUTDOORS  Repair cracks and edges of walkways and driveways.  Remove high doorway thresholds.  Trim shrubbery on the main path into your home.  Have good outside lighting.  Clear walkways of tools, rocks, debris, and clutter.  Check that handrails are not broken and are securely fastened. Both sides of steps should have handrails.  Have leaves, snow, and ice cleared regularly.  Use sand or salt on walkways during winter months.  In the garage, clean up grease or oil spills. BATHROOM  Install night lights.  Install grab bars by the toilet and in the tub and shower.  Use non-skid mats or decals in the tub or shower.  Place a plastic non-slip stool in the shower to sit on, if needed.  Keep floors dry and clean up all water on the floor immediately.  Remove soap buildup in the tub or shower on a regular basis.  Secure bath mats with non-slip, double-sided rug tape.  Remove throw rugs and tripping hazards from the floors. BEDROOMS  Install night lights.  Make sure a bedside light is easy to reach.  Do not use oversized bedding.  Keep a telephone by your bedside.  Have a firm chair with side arms to use for getting dressed.  Remove throw rugs and tripping hazards from the floor. KITCHEN  Keep handles on pots and pans turned  toward the center of the stove. Use back burners when possible.  Clean up spills quickly and allow time for drying.  Avoid walking on wet floors.  Avoid hot utensils and knives.  Position shelves so they are not too high or low.  Place commonly used objects within easy reach.  If necessary, use a sturdy step stool with a grab bar when reaching.  Keep electrical cables out of the way.  Do not use floor polish or wax that makes floors slippery. If you must use wax, use non-skid floor wax.  Remove throw rugs and tripping hazards from the floor. STAIRWAYS  Never leave objects on stairs.  Place handrails on both sides of stairways and use them. Fix any loose handrails. Make sure handrails on both sides of the stairways are as long as the stairs.  Check carpeting to make sure it is firmly attached along stairs. Make repairs to worn or loose carpet promptly.  Avoid placing throw rugs at the top or bottom of stairways, or properly secure the rug with carpet tape to prevent slippage. Get rid of throw rugs, if possible.  Have an electrician put in a light switch at the top and bottom of the stairs. OTHER FALL PREVENTION TIPS  Wear low-heel or rubber-soled shoes that are supportive and fit well. Wear closed toe shoes.  When using a stepladder, make sure it is fully opened and both spreaders are firmly locked. Do not climb a closed stepladder.  Add color or contrast  paint or tape to grab bars and handrails in your home. Place contrasting color strips on first and last steps.  Learn and use mobility aids as needed. Install an electrical emergency response system.  Turn on lights to avoid dark areas. Replace light bulbs that burn out immediately. Get light switches that glow.  Arrange furniture to create clear pathways. Keep furniture in the same place.  Firmly attach carpet with non-skid or double-sided tape.  Eliminate uneven floor surfaces.  Select a carpet pattern that does not  visually hide the edge of steps.  Be aware of all pets. OTHER HOME SAFETY TIPS  Set the water temperature for 120 F (48.8 C).  Keep emergency numbers on or near the telephone.  Keep smoke detectors on every level of the home and near sleeping areas. Document Released: 04/11/2002 Document Revised: 10/21/2011 Document Reviewed: 07/11/2011 [facility] Patient Information 2015 [place], [place]. This information is not intended to replace advice given to you by your health care provider. Make sure you discuss any questions you have with your health care provider.   Preventive Care for Adults Ages 70 and over  Blood pressure check.** / Every 1 to 2 years.  Lipid and cholesterol check.**/ Every 5 years beginning at age 19.  Lung cancer screening. / Every year if you are aged 32-80 years and have a 30-pack-year history of smoking and currently smoke or have quit within the past 15 years. Yearly screening is stopped once you have quit smoking for at least 15 years or develop a health problem that would prevent you from having lung cancer treatment.  Fecal occult blood test (FOBT) of stool. / Every year beginning at age 98 and continuing until age 47. You may not have to do this test if you get a colonoscopy every 10 years.  Flexible sigmoidoscopy** or colonoscopy.** / Every 5 years for a flexible sigmoidoscopy or every 10 years for a colonoscopy beginning at age 70 and continuing until age 79.  Hepatitis C blood test.** / For all people born from [birth year] through 1963 and any individual with known risks for hepatitis C.  Abdominal aortic aneurysm (AAA) screening.** / A one-time screening for ages 40 to 80 years who are current or former smokers.  Skin self-exam. / Monthly.  Influenza vaccine. / Every year.  Tetanus, diphtheria, and acellular pertussis (Tdap/Td) vaccine.** / 1 dose of Td every 10 years.  Varicella vaccine.** / Consult your health care provider.  Zoster vaccine.** / 1 dose for  adults aged 47 years or older.  Pneumococcal 13-valent conjugate (PCV13) vaccine.** / Consult your health care provider.  Pneumococcal polysaccharide (PPSV23) vaccine.** / 1 dose for all adults aged 27 years and older.  Meningococcal vaccine.** / Consult your health care provider.  Hepatitis A vaccine.** / Consult your health care provider.  Hepatitis B vaccine.** / Consult your health care provider.  Haemophilus influenzae type b (Hib) vaccine.** / Consult your health care provider. **Family history and personal history of risk and conditions may change your health care provider's recommendations. Document Released: 06/17/2001 Document Revised: 04/26/2013 Document Reviewed: 09/16/2010 [facility] Patient Information 2015 [place], [place]. This information is not intended to replace advice given to you by your health care provider. Make sure you discuss any questions you have with your health care provider.

## 2015-09-13 NOTE — Assessment & Plan Note (Signed)
Diabetes: Takes metformin, Januvia as needed, could not elaborate what CBG levels triggers medication intake. Last A1c satisfactory consequently recommend no change. Check A1c and micro; ophthalmology referral placed Dyslipidemia: Not taking medication regularly. Check a FLP per pt request, recommend to take medicines daily. DJD: Reports knee pain is better Elevated PSA: Reports he was recently in [place], was checked by his doctors there and was told he was okay. My nurse had difficulty communicating with him, recommend to bring a translator RTC 6 months

## 2015-09-13 NOTE — Progress Notes (Signed)
Pre visit review using our clinic review tool, if applicable. No additional management support is needed unless otherwise documented below in the visit note. 

## 2015-09-13 NOTE — Telephone Encounter (Signed)
Referral placed.

## 2015-09-13 NOTE — Progress Notes (Signed)
Subjective:    Patient ID: Marco [name], male    DOB: 08-Sep-1933, 82 y.o.   MRN: [id number]  DOS:  09/13/2015 Type of visit - description : CPX Interval history:  States he is doing well, taking his diabetes medications as needed, forgets to take cholesterol medications more often than not.   Review of Systems Constitutional: No fever. No chills. No unexplained wt changes. No unusual sweats  HEENT: No dental problems, no ear discharge, no facial swelling, no voice changes. No eye discharge, no eye  redness , no  intolerance to light   Respiratory: No wheezing , no  difficulty breathing. No cough , no mucus production  Cardiovascular: No CP, no leg swelling , no  Palpitations  GI: no nausea, no vomiting, no diarrhea , no  abdominal pain.  No blood in the stools. No dysphagia, no odynophagia    Endocrine: No polyphagia, no polyuria , no polydipsia  GU: No dysuria, gross hematuria, difficulty urinating. Occasionally has urinary urgency, no frequency.  Musculoskeletal: Had knee pain, that is actually better  Skin: No change in the color of the skin, palor , no  Rash  Allergic, immunologic: Occasional environmental allergies , no  food allergies  Neurological: No dizziness no  syncope. No headaches. No diplopia, no slurred, no slurred speech, no motor deficits, no facial  Numbness  Hematological: No enlarged lymph nodes, no easy bruising , no unusual bleedings  Psychiatry: No suicidal ideas, no hallucinations, no beavior problems, no confusion.  No unusual/severe anxiety, no depression   Past Medical History  Diagnosis Date  . Diabetes mellitus   . Elevated PSA   . Other and unspecified hyperlipidemia 04/25/2013  . DJD (degenerative joint disease) 08/25/2013    Past Surgical History  Procedure Laterality Date  . Cataract extraction      Bilaterally  . Tympanic membrane repair      R    Social History   Social History  . Marital Status: Married    Spouse  Name: N/A  . Number of Children: 3  . Years of Education: N/A   Occupational History  . Retired    Social History Main Topics  . Smoking status: Never Smoker   . Smokeless tobacco: Never Used  . Alcohol Use: No  . Drug Use: No  . Sexual Activity: Not on file   Other Topics Concern  . Not on file   Social History Narrative   Moved to the [place] area 2012     Original from [place] , Married     Lives w/ wife, son and his family    2 daughters, 1 son       Family History  Problem Relation Age of Onset  . Diabetes Neg Hx   . CAD Neg Hx   . Cancer Neg Hx        Medication List       This list is accurate as of: 09/13/15  3:59 PM.  Always use your most recent med list.               ACCU-CHEK AVIVA PLUS w/Device Kit  Use device to check blood sugar as directed.     ACCU-CHEK AVIVA Soln  Use to calibrate glucose machine as needed.     accu-chek soft touch lancets  Check blood sugar no more than twice daily.     aspirin 81 MG tablet  Take 81 mg by mouth daily.     glucose blood test  strip  Check blood sugar no more than twice daily.     JANUVIA 100 MG tablet  Generic drug:  sitaGLIPtin  TAKE 1 TABLET EVERY DAY     metFORMIN 500 MG tablet  Commonly known as:  GLUCOPHAGE  Take 1 tablet (500 mg total) by mouth 2 (two) times daily with a meal.     pravastatin 40 MG tablet  Commonly known as:  PRAVACHOL  Take 1 tablet (40 mg total) by mouth daily.     sildenafil 20 MG tablet  Commonly known as:  REVATIO  Take 2-3 tablets (40-60 mg total) by mouth daily as needed.     VITAMIN B12 PO  Take 1 tablet by mouth daily.           Objective:   Physical Exam BP 116/62 mmHg  Pulse 52  Temp(Src) 97.6 F (36.4 C) (Oral)  Ht _0  (1.727 m)  Wt 144 lb (65.318 kg)  BMI 21.90 kg/m2  SpO2 97%  General:   Well developed, well nourished . NAD.  Neck: No  Thyromegaly  HEENT:  Normocephalic . Face symmetric, atraumatic Lungs:  CTA B Normal respiratory effort,  no intercostal retractions, no accessory muscle use. Heart: RRR,  no murmur.  No pretibial edema bilaterally  Abdomen:  Not distended, soft, non-tender. No rebound or rigidity.   Skin: Exposed areas without rash. Not pale. Not jaundice Neurologic:  alert & oriented X3.  Speech normal, gait appropriate for age and unassisted Strength symmetric and appropriate for age.  Psych: Cognition and judgment appear intact.  Cooperative with normal attention span and concentration.  Behavior appropriate. No anxious or depressed appearing.    Assessment & Plan:    Assessment > DM w/ neuropathy (numbness, no pain) Dyslipidemia  DJD Osteopenia : DEXA 2015, Rx calcium and vitamin D ED Elevated PSA-prostate nodule:saw urology multiple times, last ~ 2013, declined a bx, declined further eval, aware of risks HOH  PLAN: Diabetes: Takes metformin, Januvia as needed, could not elaborate what CBG levels triggers medication intake. Last A1c satisfactory consequently recommend no change. Check A1c and micro; ophthalmology referral placed Dyslipidemia: Not taking medication regularly. Check a FLP per pt request, recommend to take medicines daily. DJD: Reports knee pain is better Elevated PSA: Reports he was recently in [place], was checked by his doctors there and was told he was okay. My nurse had difficulty communicating with him, recommend to bring a translator RTC 6 months

## 2015-09-13 NOTE — Telephone Encounter (Signed)
°  Relation to [id number] Call back [phone] Pharmacy:  Reason for call: pt has an appt with [facility] on 10-24-15 at 8:15 am, pt is needing a referral sent over prior to his appt, pt has [employer]

## 2015-09-13 NOTE — Assessment & Plan Note (Addendum)
Declined all immunizations today Reports a previous colonoscopy, no documentation, he was told it was negative. Not interested on further screening. See comments under elevated PSA Diet, exercise, end-of-life care, prevention discussed

## 2015-09-19 ENCOUNTER — Other Ambulatory Visit: Payer: Self-pay | Admitting: Internal Medicine

## 2015-09-21 ENCOUNTER — Encounter: Payer: Self-pay | Admitting: Internal Medicine

## 2015-10-06 ENCOUNTER — Other Ambulatory Visit: Payer: Self-pay | Admitting: Internal Medicine

## 2015-10-08 ENCOUNTER — Other Ambulatory Visit: Payer: Self-pay | Admitting: Internal Medicine

## 2015-10-10 ENCOUNTER — Other Ambulatory Visit: Payer: Self-pay | Admitting: Internal Medicine

## 2015-10-24 ENCOUNTER — Telehealth: Payer: Self-pay

## 2015-10-24 DIAGNOSIS — G453 Amaurosis fugax: Secondary | ICD-10-CM

## 2015-10-24 DIAGNOSIS — E119 Type 2 diabetes mellitus without complications: Secondary | ICD-10-CM | POA: Diagnosis not present

## 2015-10-24 DIAGNOSIS — H524 Presbyopia: Secondary | ICD-10-CM | POA: Diagnosis not present

## 2015-10-24 DIAGNOSIS — Z961 Presence of intraocular lens: Secondary | ICD-10-CM | POA: Diagnosis not present

## 2015-10-24 LAB — HM DIABETES EYE EXAM

## 2015-10-24 NOTE — Telephone Encounter (Signed)
Left message for the patient, asked to call me if there is any questions. Advised to come back sooner than the scheduled visit for November. Patient to call the office and set up an appointment

## 2015-10-24 NOTE — Telephone Encounter (Signed)
Call from Dr. Bing Plume, Pt dx w/ Amaurosis Fugax, Pt needing to start Aspirin 81 mg (already on medication list), needing an ECHO, Carotid US and OV w/ Dr. Larose Kells. Forwarding telephone note as requested. Orders placed for ECHO, and US Carotid-bilateral.

## 2015-10-25 ENCOUNTER — Encounter: Payer: Self-pay | Admitting: Internal Medicine

## 2015-11-05 ENCOUNTER — Other Ambulatory Visit (HOSPITAL_COMMUNITY): Payer: Commercial Managed Care - HMO

## 2015-11-13 ENCOUNTER — Other Ambulatory Visit (HOSPITAL_COMMUNITY): Payer: Commercial Managed Care - HMO

## 2015-11-21 ENCOUNTER — Telehealth: Payer: Self-pay | Admitting: Internal Medicine

## 2015-11-21 NOTE — Telephone Encounter (Signed)
Complete

## 2015-11-26 ENCOUNTER — Ambulatory Visit (HOSPITAL_COMMUNITY): Payer: Commercial Managed Care - HMO | Attending: Internal Medicine

## 2015-11-26 ENCOUNTER — Other Ambulatory Visit (HOSPITAL_COMMUNITY): Payer: Self-pay

## 2015-11-26 DIAGNOSIS — E119 Type 2 diabetes mellitus without complications: Secondary | ICD-10-CM | POA: Diagnosis not present

## 2015-11-26 DIAGNOSIS — G453 Amaurosis fugax: Secondary | ICD-10-CM | POA: Diagnosis not present

## 2015-11-26 DIAGNOSIS — E785 Hyperlipidemia, unspecified: Secondary | ICD-10-CM | POA: Insufficient documentation

## 2015-11-26 DIAGNOSIS — I351 Nonrheumatic aortic (valve) insufficiency: Secondary | ICD-10-CM | POA: Diagnosis not present

## 2015-11-29 ENCOUNTER — Ambulatory Visit: Payer: Commercial Managed Care - HMO | Admitting: Internal Medicine

## 2015-12-12 ENCOUNTER — Encounter: Payer: Self-pay | Admitting: Internal Medicine

## 2015-12-12 ENCOUNTER — Telehealth: Payer: Self-pay

## 2015-12-12 ENCOUNTER — Other Ambulatory Visit: Payer: Self-pay | Admitting: Internal Medicine

## 2015-12-12 ENCOUNTER — Ambulatory Visit (INDEPENDENT_AMBULATORY_CARE_PROVIDER_SITE_OTHER): Payer: Commercial Managed Care - HMO | Admitting: Internal Medicine

## 2015-12-12 VITALS — BP 122/68 | HR 61 | Temp 97.8°F | Resp 12 | Ht 68.0 in | Wt 144.1 lb

## 2015-12-12 DIAGNOSIS — M1711 Unilateral primary osteoarthritis, right knee: Secondary | ICD-10-CM | POA: Diagnosis not present

## 2015-12-12 DIAGNOSIS — G453 Amaurosis fugax: Secondary | ICD-10-CM

## 2015-12-12 DIAGNOSIS — M25561 Pain in right knee: Secondary | ICD-10-CM

## 2015-12-12 NOTE — Telephone Encounter (Signed)
Order for bilateral Carotid US ordered on 10/24/2015, has not been scheduled. Can you check status?

## 2015-12-12 NOTE — Progress Notes (Signed)
Pre visit review using our clinic review tool, if applicable. No additional management support is needed unless otherwise documented below in the visit note. 

## 2015-12-12 NOTE — Progress Notes (Signed)
Subjective:    Patient ID: Marco [name], male    DOB: 1933/05/16, 82 y.o.   MRN: [id number]  DOS:  12/12/2015 Type of visit - description : To discuss results Interval history: Was seen by ophthalmology with amaurosis fugax, no further symptoms.    Review of Systems Denies chest pain, difficulty breathing No headaches, dizziness, diplopia or slurred speech  Past Medical History:  Diagnosis Date  . Amaurosis fugax 2017  . Diabetes mellitus   . DJD (degenerative joint disease) 08/25/2013  . Elevated PSA   . Other and unspecified hyperlipidemia 04/25/2013    Past Surgical History:  Procedure Laterality Date  . CATARACT EXTRACTION     Bilaterally  . TYMPANIC MEMBRANE REPAIR     R    Social History   Social History  . Marital status: Married    Spouse name: N/A  . Number of children: 3  . Years of education: N/A   Occupational History  . Retired    Social History Main Topics  . Smoking status: Never Smoker  . Smokeless tobacco: Never Used  . Alcohol use No  . Drug use: No  . Sexual activity: Not on file   Other Topics Concern  . Not on file   Social History Narrative   Moved to the [place] area 2012     Original from [place] , Married     Lives w/ wife, son and his family    2 daughters, 1 son          Medication List       Accurate as of 12/12/15  6:00 PM. Always use your most recent med list.          ACCU-CHEK AVIVA PLUS test strip Generic drug:  glucose blood CHECK BLOOD SUGAR NO MORE THAN TWICE DAILY.   ACCU-CHEK AVIVA PLUS w/Device Kit USE DEVICE TO CHECK BLOOD SUGAR AS DIRECTED.   ACCU-CHEK AVIVA Soln USE TO CALIBRATE GLUCOSE MACHINE AS NEEDED.   ACCU-CHEK SOFTCLIX LANCETS lancets CHECK BLOOD SUGAR NO MORE THAN TWICE DAILY.   aspirin 81 MG tablet Take 162 mg by mouth daily.   metFORMIN 500 MG tablet Commonly known as:  GLUCOPHAGE Take 1 tablet (500 mg total) by mouth 2 (two) times daily with a meal.   pravastatin 40 MG  tablet Commonly known as:  PRAVACHOL Take 1 tablet (40 mg total) by mouth daily.   sildenafil 20 MG tablet Commonly known as:  REVATIO Take 2-3 tablets (40-60 mg total) by mouth daily as needed.   sitaGLIPtin 100 MG tablet Commonly known as:  JANUVIA Take 1 tablet (100 mg total) by mouth daily.   VITAMIN B12 PO Take 1 tablet by mouth daily.          Objective:   Physical Exam BP 122/68 (BP Location: Left Arm, Patient Position: Sitting, Cuff Size: Small)   Pulse 61   Temp 97.8 F (36.6 C) (Oral)   Resp 12   Ht '5\' 8"'  (1.727 m)   Wt 144 lb 2 oz (65.4 kg)   SpO2 95%   BMI 21.91 kg/m  General:   Well developed, well nourished . NAD.  HEENT:  Normocephalic . Face symmetric, atraumatic. EOMI, pupils equal and reactive Neck: Normal carotid pulses, no bruit Lungs:  CTA B Normal respiratory effort, no intercostal retractions, no accessory muscle use. Heart: RRR,  no murmur.  No pretibial edema bilaterally , normal pedal pulses bilaterally Skin: Not pale. Not jaundice Neurologic:  alert &  oriented X3.  Speech normal, gait appropriate , assisted by a cane, limited by DJD DTRs symmetric, strength symmetric Psych--  Cognition and judgment appear intact.  Cooperative with normal attention span and concentration.  Behavior appropriate. No anxious or depressed appearing.      Assessment & Plan:    Assessment > DM w/ neuropathy (numbness, no pain) Dyslipidemia  DJD Amaurosis fugax dx 10-2015, Dr Bing Plume Osteopenia : DEXA 2015, Rx calcium and vitamin D ED Elevated PSA-prostate nodule:saw urology multiple times, last ~ 2013, declined a bx, declined further eval, aware of risks HOH  PLAN: Amaurosis fugax: Recently dx with above while taking aspirin 81 mg most days, no further sx, plan is to manage CV RF, will be critical to improve med compliance Last A1c 7, goal 6.5, was recommended to take diabetes medication every day Last LDL 135, goal 70, was not taking Pravachol  daily, recheck on RTC Echocardiogram unremarkable Ultrasound carotid pending Increase aspirin to 162 mg qd DJD: Also continue with knee pain, refer to orthopedic surgery in [place] RTC 3 months

## 2015-12-12 NOTE — Assessment & Plan Note (Signed)
Amaurosis fugax: Recently dx with above while taking aspirin 81 mg most days, no further sx, plan is to manage CV RF, will be critical to improve med compliance Last A1c 7, goal 6.5, was recommended to take diabetes medication every day Last LDL 135, goal 70, was not taking Pravachol daily, recheck on RTC Echocardiogram unremarkable Ultrasound carotid pending Increase aspirin to 162 mg qd DJD: Also continue with knee pain, refer to orthopedic surgery in [place] RTC 3 months

## 2015-12-12 NOTE — Telephone Encounter (Signed)
Order has been resent to [facility], awaiting appt

## 2015-12-12 NOTE — Patient Instructions (Addendum)
Increase aspirin 81 mg to 2 tablets daily  Take your diabetes medications every day regardless of your blood sugar levels  Take your cholesterol medication every day  Come back in 2 months for a checkup

## 2015-12-14 DIAGNOSIS — M1711 Unilateral primary osteoarthritis, right knee: Secondary | ICD-10-CM | POA: Diagnosis not present

## 2015-12-14 DIAGNOSIS — M545 Low back pain: Secondary | ICD-10-CM | POA: Diagnosis not present

## 2015-12-21 ENCOUNTER — Encounter: Payer: Self-pay | Admitting: Internal Medicine

## 2016-01-30 DIAGNOSIS — M25361 Other instability, right knee: Secondary | ICD-10-CM | POA: Diagnosis not present

## 2016-01-30 DIAGNOSIS — M1711 Unilateral primary osteoarthritis, right knee: Secondary | ICD-10-CM | POA: Diagnosis not present

## 2016-02-20 ENCOUNTER — Encounter: Payer: Self-pay | Admitting: Internal Medicine

## 2016-02-20 ENCOUNTER — Ambulatory Visit (INDEPENDENT_AMBULATORY_CARE_PROVIDER_SITE_OTHER): Payer: Commercial Managed Care - HMO | Admitting: Internal Medicine

## 2016-02-20 VITALS — BP 108/64 | HR 56 | Temp 98.2°F | Resp 14 | Ht 68.0 in | Wt 147.0 lb

## 2016-02-20 DIAGNOSIS — G453 Amaurosis fugax: Secondary | ICD-10-CM

## 2016-02-20 DIAGNOSIS — E118 Type 2 diabetes mellitus with unspecified complications: Secondary | ICD-10-CM

## 2016-02-20 DIAGNOSIS — E785 Hyperlipidemia, unspecified: Secondary | ICD-10-CM | POA: Diagnosis not present

## 2016-02-20 DIAGNOSIS — M170 Bilateral primary osteoarthritis of knee: Secondary | ICD-10-CM | POA: Diagnosis not present

## 2016-02-20 LAB — LIPID PANEL
CHOLESTEROL: 156 mg/dL (ref 0–200)
HDL: 45.4 mg/dL (ref >39.00)
LDL CALC: 92 mg/dL (ref 0–99)
NONHDL: 110.13
Total CHOL/HDL Ratio: 3
Triglycerides: 89 mg/dL (ref 0.0–149.0)
VLDL: 17.8 mg/dL (ref 0.0–40.0)

## 2016-02-20 LAB — BASIC METABOLIC PANEL
BUN: 17 mg/dL (ref 6–23)
CALCIUM: 9.7 mg/dL (ref 8.4–10.5)
CO2: 28 meq/L (ref 19–32)
Chloride: 105 mEq/L (ref 96–112)
Creatinine, Ser: 1.1 mg/dL (ref 0.40–1.50)
GFR: 68.32 mL/min (ref >60.00)
Glucose, Bld: 117 mg/dL — ABNORMAL HIGH (ref 70–99)
Potassium: 4.7 mEq/L (ref 3.5–5.1)
SODIUM: 140 meq/L (ref 135–145)

## 2016-02-20 LAB — AST: AST: 21 U/L (ref 0–37)

## 2016-02-20 LAB — ALT: ALT: 14 U/L (ref 0–53)

## 2016-02-20 LAB — HEMOGLOBIN A1C: HEMOGLOBIN A1C: 6.8 % — AB (ref 4.6–6.5)

## 2016-02-20 MED ORDER — SILDENAFIL CITRATE 20 MG PO TABS: 40.0000 mg | ORAL_TABLET | Freq: Every day | ORAL | 3 refills | Status: DC | PRN

## 2016-02-20 NOTE — Patient Instructions (Signed)
GO TO THE LAB : Get the blood work     GO TO THE FRONT DESK Schedule your next appointment for a  Check up in 4-5 months   

## 2016-02-20 NOTE — Progress Notes (Signed)
Pre visit review using our clinic review tool, if applicable. No additional management support is needed unless otherwise documented below in the visit note. 

## 2016-02-20 NOTE — Progress Notes (Signed)
Subjective:    Patient ID: Marco [name], male    DOB: August 12, 1933, 82 y.o.   MRN: [id number]  DOS:  02/20/2016 Type of visit - description : rov Interval history: DM: Reports good compliance with medications, ambulatory CBGs in the low 100s. High cholesterol: Good med compliance without apparent side effects Amaurosis fugax: No further symptoms, carotid ultrasound was not done. DJD: Note from orthopedic surgery reviewed  Review of Systems Denies chest pain, difficulty breathing or palpitations No headaches, dizziness.   Past Medical History:  Diagnosis Date  . Amaurosis fugax 2017  . Diabetes mellitus   . DJD (degenerative joint disease) 08/25/2013  . Elevated PSA   . Other and unspecified hyperlipidemia 04/25/2013    Past Surgical History:  Procedure Laterality Date  . CATARACT EXTRACTION     Bilaterally  . TYMPANIC MEMBRANE REPAIR     R    Social History   Social History  . Marital status: Married    Spouse name: N/A  . Number of children: 3  . Years of education: N/A   Occupational History  . Retired    Social History Main Topics  . Smoking status: Never Smoker  . Smokeless tobacco: Never Used  . Alcohol use No  . Drug use: No  . Sexual activity: Not on file   Other Topics Concern  . Not on file   Social History Narrative   Moved to the [place] area 2012     Original from [place] , Married     Lives w/ wife, son and his family    2 daughters, 1 son          Medication List       Accurate as of 02/20/16  1:56 PM. Always use your most recent med list.          ACCU-CHEK AVIVA PLUS test strip Generic drug:  glucose blood CHECK BLOOD SUGAR NO MORE THAN TWICE DAILY.   ACCU-CHEK AVIVA PLUS w/Device Kit USE DEVICE TO CHECK BLOOD SUGAR AS DIRECTED.   ACCU-CHEK AVIVA Soln USE TO CALIBRATE GLUCOSE MACHINE AS NEEDED.   ACCU-CHEK SOFTCLIX LANCETS lancets CHECK BLOOD SUGAR NO MORE THAN TWICE DAILY.   aspirin 81 MG tablet Take 162 mg by mouth  daily.   metFORMIN 500 MG tablet Commonly known as:  GLUCOPHAGE Take 1 tablet (500 mg total) by mouth 2 (two) times daily with a meal.   pravastatin 40 MG tablet Commonly known as:  PRAVACHOL Take 1 tablet (40 mg total) by mouth daily.   sildenafil 20 MG tablet Commonly known as:  REVATIO Take 2-3 tablets (40-60 mg total) by mouth daily as needed.   sitaGLIPtin 100 MG tablet Commonly known as:  JANUVIA Take 1 tablet (100 mg total) by mouth daily.   VITAMIN B12 PO Take 1 tablet by mouth daily.          Objective:   Physical Exam BP 108/64 (BP Location: Left Arm, Patient Position: Sitting, Cuff Size: Small)   Pulse (!) 56   Temp 98.2 F (36.8 C) (Oral)   Resp 14   Ht '5\' 8"'  (1.727 m)   Wt 147 lb (66.7 kg)   SpO2 98%   BMI 22.35 kg/m  General:   Well developed, well nourished . NAD.  HEENT:  Normocephalic . Face symmetric, atraumatic Neck: Normal carotid pulses Lungs:  CTA B Normal respiratory effort, no intercostal retractions, no accessory muscle use. Heart: RRR,  no murmur.  No pretibial edema bilaterally  Skin: Not pale. Not jaundice Neurologic:  alert & oriented X3.  Speech normal, gait assisted by a cane mostly due to knee pain Psych--  Cognition and judgment appear intact.  Cooperative with normal attention span and concentration.  Behavior appropriate. No anxious or depressed appearing.      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 Check a  A1c, BMP Dyslipidemia: On Pravachol, check FLP, AST, ALT DJD, note from Dr. Linton Rump reviewed, DX primary DJD knee, had injection. Have  some relief. Declined a flu shot RTC 4-5 months, routine checkup

## 2016-02-20 NOTE — Assessment & Plan Note (Signed)
Amaurosis fugax: No further sx,  carotid US not done, will try again DM: Continue metformin, Januvia. Check a  A1c, BMP Dyslipidemia: On Pravachol, check FLP, AST, ALT DJD, note from Dr. Linton Rump reviewed, DX primary DJD knee, had injection. Have some relief. Declined a flu shot RTC 4-5 months, routine checkup

## 2016-02-21 ENCOUNTER — Ambulatory Visit (HOSPITAL_BASED_OUTPATIENT_CLINIC_OR_DEPARTMENT_OTHER)
Admission: RE | Admit: 2016-02-21 | Discharge: 2016-02-21 | Disposition: A | Discharge status: Home / Self Care | Payer: Commercial Managed Care - HMO | Source: Ambulatory Visit | Attending: Internal Medicine | Admitting: Internal Medicine

## 2016-02-21 DIAGNOSIS — G453 Amaurosis fugax: Secondary | ICD-10-CM | POA: Insufficient documentation

## 2016-02-21 DIAGNOSIS — I6523 Occlusion and stenosis of bilateral carotid arteries: Secondary | ICD-10-CM | POA: Diagnosis not present

## 2016-02-22 MED ORDER — METFORMIN HCL 1000 MG PO TABS
1000.0000 mg | ORAL_TABLET | Freq: Two times a day (BID) | ORAL | 1 refills | Status: DC
Start: 2016-02-22 — End: 2016-07-24

## 2016-02-22 NOTE — Addendum Note (Signed)
Addended byDamita Dunnings D on: 02/22/2016 02:14 PM   Modules accepted: Orders

## 2016-02-26 ENCOUNTER — Telehealth: Payer: Self-pay

## 2016-02-26 NOTE — Telephone Encounter (Signed)
Denied: 02/26/2016. PA Case: [id number], Status: Denied. Notification: Completed.

## 2016-02-26 NOTE — Telephone Encounter (Signed)
PA initiated via Covermymeds; KEY: [id number]. Awaiting determination.

## 2016-02-27 NOTE — Telephone Encounter (Signed)
Received PA denial notification. ED meds not covered under Medicare Part D. Notification sent for scanning.

## 2016-03-04 DIAGNOSIS — Z0100 Encounter for examination of eyes and vision without abnormal findings: Secondary | ICD-10-CM | POA: Diagnosis not present

## 2016-03-17 ENCOUNTER — Encounter: Payer: Self-pay | Admitting: Internal Medicine

## 2016-03-17 ENCOUNTER — Ambulatory Visit (INDEPENDENT_AMBULATORY_CARE_PROVIDER_SITE_OTHER): Payer: Commercial Managed Care - HMO | Admitting: Internal Medicine

## 2016-03-17 VITALS — BP 112/64 | HR 62 | Temp 98.2°F | Resp 14 | Ht 68.0 in | Wt 148.1 lb

## 2016-03-17 DIAGNOSIS — E118 Type 2 diabetes mellitus with unspecified complications: Secondary | ICD-10-CM | POA: Diagnosis not present

## 2016-03-17 DIAGNOSIS — E785 Hyperlipidemia, unspecified: Secondary | ICD-10-CM

## 2016-03-17 DIAGNOSIS — G453 Amaurosis fugax: Secondary | ICD-10-CM

## 2016-03-17 NOTE — Progress Notes (Signed)
Subjective:    Patient ID: Marco [name], male    DOB: Sep 27, 1933, 82 y.o.   MRN: [id number]  DOS:  03/17/2016 Type of visit - description : rov Interval history: No concerns, reports good med compliance except ASA, taking only 81 mg qd  amb CBGs, ~ 96, 100  Review of Systems No CP-SOB No further visual disturbances   Past Medical History:  Diagnosis Date  . Amaurosis fugax 2017  . Diabetes mellitus   . DJD (degenerative joint disease) 08/25/2013  . Elevated PSA   . Other and unspecified hyperlipidemia 04/25/2013    Past Surgical History:  Procedure Laterality Date  . CATARACT EXTRACTION     Bilaterally  . TYMPANIC MEMBRANE REPAIR     R    Social History   Social History  . Marital status: Married    Spouse name: N/A  . Number of children: 3  . Years of education: N/A   Occupational History  . Retired    Social History Main Topics  . Smoking status: Never Smoker  . Smokeless tobacco: Never Used  . Alcohol use No  . Drug use: No  . Sexual activity: Not on file   Other Topics Concern  . Not on file   Social History Narrative   Moved to the [place] area 2012     Original from [place] , Married     Lives w/ wife, son and his family    2 daughters, 1 son          Medication List       Accurate as of 03/17/16 10:11 AM. Always use your most recent med list.          ACCU-CHEK AVIVA PLUS test strip Generic drug:  glucose blood CHECK BLOOD SUGAR NO MORE THAN TWICE DAILY.   ACCU-CHEK AVIVA PLUS w/Device Kit USE DEVICE TO CHECK BLOOD SUGAR AS DIRECTED.   ACCU-CHEK AVIVA Soln USE TO CALIBRATE GLUCOSE MACHINE AS NEEDED.   ACCU-CHEK SOFTCLIX LANCETS lancets CHECK BLOOD SUGAR NO MORE THAN TWICE DAILY.   aspirin 81 MG tablet Take 162 mg by mouth daily.   metFORMIN 1000 MG tablet Commonly known as:  GLUCOPHAGE Take 1 tablet (1,000 mg total) by mouth 2 (two) times daily with a meal.   pravastatin 40 MG tablet Commonly known as:  PRAVACHOL Take 1  tablet (40 mg total) by mouth daily.   sildenafil 20 MG tablet Commonly known as:  REVATIO Take 2-3 tablets (40-60 mg total) by mouth daily as needed.   sitaGLIPtin 100 MG tablet Commonly known as:  JANUVIA Take 1 tablet (100 mg total) by mouth daily.   VITAMIN B12 PO Take 1 tablet by mouth daily.          Objective:   Physical Exam BP 112/64 (BP Location: Left Arm, Patient Position: Sitting, Cuff Size: Small)   Pulse 62   Temp 98.2 F (36.8 C) (Oral)   Resp 14   Ht '5\' 8"'  (1.727 m)   Wt 148 lb 2 oz (67.2 kg)   SpO2 99%   BMI 22.52 kg/m  General:   Well developed, well nourished . NAD.  HEENT:  Normocephalic . Face symmetric, atraumatic Neck -- normal carotid pulses  Lungs:  CTA B Normal respiratory effort, no intercostal retractions, no accessory muscle use. Heart: RRR,  no murmur.  No pretibial edema bilaterally  Skin: Not pale. Not jaundice Neurologic:  alert & oriented X3.  Speech normal, gait appropriate for age and  unassisted Psych--  Cognition and judgment appear intact.  Cooperative with normal attention span and concentration.  Behavior appropriate. No anxious or depressed appearing.      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 He is somewhat reluctant but states will do. DM: Improved based on the last A1c. No change. High cholesterol:Last LDL decreased from 135-92. Continue Pravachol Declined immunizations RTC 4 months, fasting, routine check

## 2016-03-17 NOTE — Assessment & Plan Note (Signed)
Amaurosis fugax: No further sx, w/u negative, was taking aspirin 81 once daily, recommend to increase to 162 mg a day. He is somewhat reluctant but states will do. DM: Improved based on the last A1c. No change. High cholesterol:Last LDL decreased from 135-92. Continue Pravachol Declined immunizations RTC 4 months, fasting, routine check

## 2016-03-17 NOTE — Patient Instructions (Signed)
Continue the same medicines  Next visit in 4 months, fasting

## 2016-03-17 NOTE — Progress Notes (Signed)
Pre visit review using our clinic review tool, if applicable. No additional management support is needed unless otherwise documented below in the visit note. 

## 2016-07-02 ENCOUNTER — Telehealth: Payer: Self-pay

## 2016-07-02 NOTE — Telephone Encounter (Signed)
PA initiated via Covermymeds; KEY: [id number]. Received electronic PA denial received. PA reference number: [id number].

## 2016-07-04 NOTE — Telephone Encounter (Signed)
Received PA denial notification letter. ED (erectile dysfunction) is excluded from Medicare Part D coverage.

## 2016-07-09 NOTE — Telephone Encounter (Signed)
PA had already been denied on 07/04/2016.

## 2016-07-09 NOTE — Telephone Encounter (Signed)
Received appeal form from [facility]? Initiated by Pt? 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 Form completed and faxed to ATTN: Paige at [facility]. Forms sent for scanning.

## 2016-07-09 NOTE — Telephone Encounter (Signed)
Caller name: Arby Barrette from [facility]  Call back number: [phone] reference # [id number]   Reason for call:  In need of response by the end of today business day or medication will be denied, [employer] Prior Auth would like to know dx code (options given) pulmonary, arterial hypertension (whogrop1) ? Please advise

## 2016-07-15 NOTE — Telephone Encounter (Signed)
Received PA appeal determination, insurance upheld PA denial. PA appeal determination notification sent for scanning.

## 2016-07-16 ENCOUNTER — Ambulatory Visit: Payer: Commercial Managed Care - HMO | Admitting: Internal Medicine

## 2016-07-22 ENCOUNTER — Ambulatory Visit: Payer: Commercial Managed Care - HMO | Admitting: Internal Medicine

## 2016-07-22 ENCOUNTER — Telehealth: Payer: Self-pay | Admitting: *Deleted

## 2016-07-22 NOTE — Progress Notes (Signed)
Pre visit review using our clinic review tool, if applicable. No additional management support is needed unless otherwise documented below in the visit note. 

## 2016-07-22 NOTE — Telephone Encounter (Signed)
AWV scheduled 07/24/16 @8 .

## 2016-07-22 NOTE — Progress Notes (Signed)
Subjective:   Marco [name] is a 83 y.o. male who presents for Medicare Annual/Subsequent preventive examination.  Review of Systems:  No ROS.  Medicare Wellness Visit. Cardiac Risk Factors include: advanced age (>39mn, >>56women);dyslipidemia;male gender;diabetes mellitus;sedentary lifestyle Sleep patterns: Sleeps 5-6 hrs per night. Also naps. Home Safety/Smoke Alarms: Feels safe in home. Smoke alarms in place.  Living environment; residence and Firearm Safety: lives with son and son's family. Seat Belt Safety/Bike Helmet: Wears seat belt.   Counseling:   Eye Exam- Wears glasses. Eye doctor annually. [place]  Male:   CCS- n/a due to age.     PSA-  Lab Results  Component Value Date   PSA 17.01 (H) 08/22/2011        Objective:    Vitals: BP (!) 120/56 (BP Location: Left Arm, Patient Position: Sitting, Cuff Size: Normal)   Pulse (!) 55   Ht _0  (1.727 m)   Wt 149 lb (67.6 kg)   SpO2 100%   BMI 22.66 kg/m   Body mass index is 22.66 kg/m.  Tobacco History  Smoking Status  . Never Smoker  Smokeless Tobacco  . Never Used     Counseling given: Not Answered   Past Medical History:  Diagnosis Date  . Amaurosis fugax 2017  . Diabetes mellitus   . DJD (degenerative joint disease) 08/25/2013  . Elevated PSA   . Other and unspecified hyperlipidemia 04/25/2013   Past Surgical History:  Procedure Laterality Date  . CATARACT EXTRACTION     Bilaterally  . TYMPANIC MEMBRANE REPAIR     R   Family History  Problem Relation Age of Onset  . Diabetes Neg Hx   . CAD Neg Hx   . Cancer Neg Hx    History  Sexual Activity  . Sexual activity: Not on file    Outpatient Encounter Prescriptions as of 07/24/2016  Medication Sig  . ACCU-CHEK AVIVA PLUS test strip CHECK BLOOD SUGAR NO MORE THAN TWICE DAILY.  .Marland KitchenACCU-CHEK SOFTCLIX LANCETS lancets CHECK BLOOD SUGAR NO MORE THAN TWICE DAILY.  .Marland Kitchenaspirin 81 MG  tablet Take 162 mg by mouth daily.  . Blood Glucose Calibration (ACCU-CHEK AVIVA) SOLN USE TO CALIBRATE GLUCOSE MACHINE AS NEEDED.  .Marland KitchenBlood Glucose Monitoring Suppl (ACCU-CHEK AVIVA PLUS) w/Device KIT USE DEVICE TO CHECK BLOOD SUGAR AS DIRECTED.  .Marland KitchenmetFORMIN (GLUCOPHAGE) 1000 MG tablet Take 1 tablet (1,000 mg total) by mouth 2 (two) times daily with a meal.  . pravastatin (PRAVACHOL) 40 MG tablet Take 1 tablet (40 mg total) by mouth daily.  . sitaGLIPtin (JANUVIA) 100 MG tablet Take 1 tablet (100 mg total) by mouth daily.  . Cyanocobalamin (VITAMIN B12 PO) Take 1 tablet by mouth daily.  . sildenafil (REVATIO) 20 MG tablet Take 2-3 tablets (40-60 mg total) by mouth daily as needed. (Patient not taking: Reported on 03/17/2016)   No facility-administered encounter medications on file as of 07/24/2016.     Activities of Daily Living In your present state of health, do you have any difficulty performing the following activities: 07/24/2016 02/20/2016  Hearing? N Y  Vision? N N  Difficulty concentrating or making decisions? N N  Walking or climbing stairs? N Y  Dressing or bathing? N N  Doing errands, shopping? (No Data) N  Preparing Food and eating ? N -  Using the Toilet? N -  In the past six months, have you accidently leaked  urine? Y -  Do you have problems with loss of bowel control? N -  Managing your Medications? N -  Managing your Finances? N -  Housekeeping or managing your Housekeeping? N -  Some recent data might be hidden    Patient Care Team: Colon Branch, MD as PCP - General (Internal Medicine) Virgina Evener, OD as Referring Physician (Optometry) Calvert Cantor, MD as Consulting Physician (Ophthalmology)   Assessment:    Physical assessment deferred to PCP.  Exercise Activities and Dietary recommendations Current Exercise Habits: The patient does not participate in regular exercise at present, Exercise limited by: Other - see comments   Diet (meal preparation, eat out, water  intake, caffeinated beverages, dairy products, fruits and vegetables): in general, a "healthy" diet     Goals      Patient Stated   . <enter goal here> (pt-stated)          Maintain healthy lifestyle.      Fall Risk Fall Risk  07/24/2016 02/20/2016 09/13/2015 03/08/2015 09/15/2014  Falls in the past year? _0    Depression Screen PHQ 2/9 Scores 07/24/2016 02/20/2016 09/13/2015 03/08/2015  PHQ - 2 Score 0 0 0 0    Cognitive Function MMSE - Mini Mental State Exam 07/24/2016  Orientation to time 5  Orientation to Place 5  Registration 3  Attention/ Calculation 5  Recall 2  Language- name 2 objects 2  Language- repeat 1  Language- follow 3 step command 3  Language- read & follow direction 1  Write a sentence 1  Copy design 1  Total score 29         There is no immunization history on file for this patient. Screening Tests Health Maintenance  Topic Date Due  . INFLUENZA VACCINE  08/02/2016 (Originally 12/04/2015)  . TETANUS/TDAP  07/23/2017 (Originally 05/12/1952)  . PNA vac Low Risk Adult (1 of 2 - PCV13) 07/23/2017 (Originally 05/12/2000)  . HEMOGLOBIN A1C  08/20/2016  . URINE MICROALBUMIN  09/12/2016  . OPHTHALMOLOGY EXAM  10/23/2016  . FOOT EXAM  07/24/2017      Plan:     Follow up with PCP today as scheduled.  Continue to eat heart healthy diet (full of fruits, vegetables, whole grains, lean protein, water--limit salt, fat, and sugar intake) and increase physical activity as tolerated.  Continue doing brain stimulating activities (puzzles, reading, adult coloring books, staying active) to keep memory sharp.    During the course of the visit the patient was educated and counseled about the following appropriate screening and preventive services:   Vaccines to include Pneumoccal, Influenza, Hepatitis B, Td, HCV  Cardiovascular Disease  Colorectal cancer screening  Diabetes screening  Prostate Cancer Screening  Glaucoma screening  Nutrition  counseling    Patient Instructions (the written plan) was given to the patient.    Naaman Plummer [place], [place]  07/24/2016  [name], MD

## 2016-07-24 ENCOUNTER — Ambulatory Visit (INDEPENDENT_AMBULATORY_CARE_PROVIDER_SITE_OTHER): Payer: Medicare HMO | Admitting: Internal Medicine

## 2016-07-24 ENCOUNTER — Encounter: Payer: Self-pay | Admitting: Internal Medicine

## 2016-07-24 VITALS — BP 120/56 | HR 55 | Ht 68.0 in | Wt 149.0 lb

## 2016-07-24 DIAGNOSIS — E119 Type 2 diabetes mellitus without complications: Secondary | ICD-10-CM | POA: Diagnosis not present

## 2016-07-24 DIAGNOSIS — R972 Elevated prostate specific antigen [PSA]: Secondary | ICD-10-CM

## 2016-07-24 DIAGNOSIS — Z0000 Encounter for general adult medical examination without abnormal findings: Secondary | ICD-10-CM

## 2016-07-24 DIAGNOSIS — G453 Amaurosis fugax: Secondary | ICD-10-CM | POA: Diagnosis not present

## 2016-07-24 DIAGNOSIS — R399 Unspecified symptoms and signs involving the genitourinary system: Secondary | ICD-10-CM | POA: Diagnosis not present

## 2016-07-24 DIAGNOSIS — N402 Nodular prostate without lower urinary tract symptoms: Secondary | ICD-10-CM | POA: Diagnosis not present

## 2016-07-24 LAB — URINALYSIS, ROUTINE W REFLEX MICROSCOPIC
BILIRUBIN URINE: NEGATIVE
Hgb urine dipstick: NEGATIVE
KETONES UR: NEGATIVE
Leukocytes, UA: NEGATIVE
NITRITE: NEGATIVE
RBC / HPF: NONE SEEN (ref 0–?)
Total Protein, Urine: NEGATIVE
Urine Glucose: NEGATIVE
Urobilinogen, UA: 0.2 (ref 0.0–1.0)
pH: 5.5 (ref 5.0–8.0)

## 2016-07-24 LAB — BASIC METABOLIC PANEL
BUN: 18 mg/dL (ref 6–23)
CHLORIDE: 104 meq/L (ref 96–112)
CO2: 29 mEq/L (ref 19–32)
CREATININE: 1.12 mg/dL (ref 0.40–1.50)
Calcium: 9.9 mg/dL (ref 8.4–10.5)
GFR: 66.85 mL/min (ref >60.00)
Glucose, Bld: 118 mg/dL — ABNORMAL HIGH (ref 70–99)
Potassium: 4.8 mEq/L (ref 3.5–5.1)
Sodium: 138 mEq/L (ref 135–145)

## 2016-07-24 LAB — HEMOGLOBIN A1C: HEMOGLOBIN A1C: 6.9 % — AB (ref 4.6–6.5)

## 2016-07-24 MED ORDER — METFORMIN HCL 500 MG PO TABS: 1000.0000 mg | ORAL_TABLET | Freq: Two times a day (BID) | ORAL | 3 refills | Status: DC

## 2016-07-24 MED ORDER — TAMSULOSIN HCL 0.4 MG PO CAPS: 0.4000 mg | ORAL_CAPSULE | Freq: Every day | ORAL | 3 refills | Status: DC

## 2016-07-24 MED FILL — TAMSULOSIN HCL 0.4 MG CAP: 0.4 | 30 days supply | Qty: 30 | Fill #0

## 2016-07-24 NOTE — Assessment & Plan Note (Signed)
DM: Continue Januvia. Request to change metformin to day 500 mg tablets due to pill size. New prescription sent, check a BMP and 1C. amaurosis fugax:  No further sxs, states is unable to take aspirin 2 tablets qd, could not elaborate why. Elevated PSA, prostate nodules: Today reports difficulty urinating at night, declined DRE, he simply request a medication, will prescribe tamsulosin and definitely call if not improving because he will need to see urology again. Will check a UA - urine culture. RTC 4 months

## 2016-07-24 NOTE — Progress Notes (Signed)
Subjective:    Patient ID: Marco [name], male    DOB: 1934-01-14, 83 y.o.   MRN: [id number]  DOS:  07/24/2016 Type of visit - description : rov Interval history: Amaurosis fugax: No further symptoms, taking aspirin one tablet daily, states he cannot take 2 tablets a day. DM: Good med compliance but likes to take smaller tablets of metformin. History of prostate nodule: Today reports that he has been experiencing some difficulty urinating, mostly at night, described as "start and stop" urine flow. Request a medication.   Review of Systems Denies chest pain or difficulty breathing No nausea or vomiting No dysuria or gross hematuria   Past Medical History:  Diagnosis Date  . Amaurosis fugax 2017  . Diabetes mellitus   . DJD (degenerative joint disease) 08/25/2013  . Elevated PSA   . Other and unspecified hyperlipidemia 04/25/2013    Past Surgical History:  Procedure Laterality Date  . CATARACT EXTRACTION     Bilaterally  . TYMPANIC MEMBRANE REPAIR     R    Social History   Social History  . Marital status: Married    Spouse name: N/A  . Number of children: 3  . Years of education: N/A   Occupational History  . Retired    Social History Main Topics  . Smoking status: Never Smoker  . Smokeless tobacco: Never Used  . Alcohol use No  . Drug use: No  . Sexual activity: Not on file   Other Topics Concern  . Not on file   Social History Narrative   Moved to the [place] area 2012     Original from [place] , Married     Lives w/ wife, son and his family    2 daughters, 1 son        Allergies as of 07/24/2016   No Known Allergies     Medication List       Accurate as of 07/24/16  1:47 PM. Always use your most recent med list.          ACCU-CHEK AVIVA PLUS test strip Generic drug:  glucose blood CHECK BLOOD SUGAR NO MORE THAN TWICE DAILY.   ACCU-CHEK AVIVA PLUS w/Device Kit USE DEVICE TO CHECK BLOOD SUGAR AS DIRECTED.   ACCU-CHEK AVIVA Soln USE TO  CALIBRATE GLUCOSE MACHINE AS NEEDED.   ACCU-CHEK SOFTCLIX LANCETS lancets CHECK BLOOD SUGAR NO MORE THAN TWICE DAILY.   aspirin EC 81 MG tablet Take 81 mg by mouth daily.   metFORMIN 500 MG tablet Commonly known as:  GLUCOPHAGE Take 2 tablets (1,000 mg total) by mouth 2 (two) times daily with a meal. THE PATIENT CAN NOT SWALLOW LARGE PILLS   pravastatin 40 MG tablet Commonly known as:  PRAVACHOL Take 1 tablet (40 mg total) by mouth daily.   sildenafil 20 MG tablet Commonly known as:  REVATIO Take 2-3 tablets (40-60 mg total) by mouth daily as needed.   sitaGLIPtin 100 MG tablet Commonly known as:  JANUVIA Take 1 tablet (100 mg total) by mouth daily.   tamsulosin 0.4 MG Caps capsule Commonly known as:  FLOMAX Take 1 capsule (0.4 mg total) by mouth daily.   VITAMIN B12 PO Take 1 tablet by mouth daily.          Objective:   Physical Exam BP (!) 120/56 (BP Location: Left Arm, Patient Position: Sitting, Cuff Size: Normal)   Pulse (!) 55   Ht _0  (1.727 m)   Wt 149 lb (67.6 kg)  SpO2 100%   BMI 22.66 kg/m  General:   Well developed, well nourished . NAD.  HEENT:  Normocephalic . Face symmetric, atraumatic Lungs:  CTA B Normal respiratory effort, no intercostal retractions, no accessory muscle use. Heart: RRR,  no murmur.  No pretibial edema bilaterally  Skin: Not pale. Not jaundice DRE: declined  Neurologic:  alert & oriented X3.  Speech normal, gait assisted by a cane, he seems to be limited by DJD. Psych--  Cognition and judgment appear intact.  Cooperative with normal attention span and concentration.  Behavior appropriate. No anxious or depressed appearing.      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 Request to change metformin to day 500 mg tablets due to pill size. New prescription sent, check a BMP and 1C. amaurosis fugax:  No further sxs, states is unable to take aspirin 2 tablets qd, could not elaborate why. Elevated PSA, prostate nodules: Today reports difficulty urinating at night, declined DRE, he simply request a medication, will prescribe tamsulosin and definitely call if not improving because he will need to see urology again. Will check a UA - urine culture. RTC 4 months

## 2016-07-24 NOTE — Patient Instructions (Addendum)
GO TO THE LAB : Get the blood work     GO TO THE FRONT DESK Schedule your next appointment for a  checkup in 4 months   Start taking tamsulosin one tablet daily to help your prostate. If that is not helping, let me know.  We sent a prescription of   metformin 500 mg at your request:  take 2 tablets twice a day   ==============================  Continue to eat heart healthy diet (full of fruits, vegetables, whole grains, lean protein, water--limit salt, fat, and sugar intake) and increase physical activity as tolerated.  Continue doing brain stimulating activities (puzzles, reading, adult coloring books, staying active) to keep memory sharp.

## 2016-07-25 LAB — URINE CULTURE: Organism ID, Bacteria: NO GROWTH

## 2016-07-26 ENCOUNTER — Encounter: Payer: Self-pay | Admitting: Internal Medicine

## 2016-11-12 DIAGNOSIS — H40013 Open angle with borderline findings, low risk, bilateral: Secondary | ICD-10-CM | POA: Diagnosis not present

## 2016-11-12 DIAGNOSIS — H0014 Chalazion left upper eyelid: Secondary | ICD-10-CM | POA: Diagnosis not present

## 2016-11-12 DIAGNOSIS — H524 Presbyopia: Secondary | ICD-10-CM | POA: Diagnosis not present

## 2016-11-12 DIAGNOSIS — H04123 Dry eye syndrome of bilateral lacrimal glands: Secondary | ICD-10-CM | POA: Diagnosis not present

## 2016-11-12 DIAGNOSIS — E119 Type 2 diabetes mellitus without complications: Secondary | ICD-10-CM | POA: Diagnosis not present

## 2016-11-12 DIAGNOSIS — H5213 Myopia, bilateral: Secondary | ICD-10-CM | POA: Diagnosis not present

## 2016-11-12 DIAGNOSIS — H52223 Regular astigmatism, bilateral: Secondary | ICD-10-CM | POA: Diagnosis not present

## 2016-11-12 LAB — HM DIABETES EYE EXAM

## 2016-11-17 ENCOUNTER — Encounter: Payer: Self-pay | Admitting: Internal Medicine

## 2016-11-19 ENCOUNTER — Observation Stay (HOSPITAL_BASED_OUTPATIENT_CLINIC_OR_DEPARTMENT_OTHER)
Admission: EM | Admit: 2016-11-19 | Discharge: 2016-11-21 | Disposition: A | Discharge status: Home / Self Care | Payer: Medicare HMO | Attending: Family Medicine | Admitting: Family Medicine

## 2016-11-19 ENCOUNTER — Emergency Department (HOSPITAL_BASED_OUTPATIENT_CLINIC_OR_DEPARTMENT_OTHER): Payer: Medicare HMO

## 2016-11-19 ENCOUNTER — Encounter (HOSPITAL_BASED_OUTPATIENT_CLINIC_OR_DEPARTMENT_OTHER): Payer: Self-pay

## 2016-11-19 DIAGNOSIS — Z9841 Cataract extraction status, right eye: Secondary | ICD-10-CM | POA: Diagnosis not present

## 2016-11-19 DIAGNOSIS — E119 Type 2 diabetes mellitus without complications: Secondary | ICD-10-CM

## 2016-11-19 DIAGNOSIS — I252 Old myocardial infarction: Secondary | ICD-10-CM | POA: Diagnosis not present

## 2016-11-19 DIAGNOSIS — Z7982 Long term (current) use of aspirin: Secondary | ICD-10-CM | POA: Diagnosis not present

## 2016-11-19 DIAGNOSIS — M170 Bilateral primary osteoarthritis of knee: Secondary | ICD-10-CM | POA: Diagnosis not present

## 2016-11-19 DIAGNOSIS — H9190 Unspecified hearing loss, unspecified ear: Secondary | ICD-10-CM | POA: Diagnosis not present

## 2016-11-19 DIAGNOSIS — Z9842 Cataract extraction status, left eye: Secondary | ICD-10-CM | POA: Diagnosis not present

## 2016-11-19 DIAGNOSIS — G453 Amaurosis fugax: Secondary | ICD-10-CM | POA: Diagnosis not present

## 2016-11-19 DIAGNOSIS — Z8249 Family history of ischemic heart disease and other diseases of the circulatory system: Secondary | ICD-10-CM | POA: Diagnosis not present

## 2016-11-19 DIAGNOSIS — Z823 Family history of stroke: Secondary | ICD-10-CM | POA: Insufficient documentation

## 2016-11-19 DIAGNOSIS — M8580 Other specified disorders of bone density and structure, unspecified site: Secondary | ICD-10-CM | POA: Diagnosis not present

## 2016-11-19 DIAGNOSIS — E785 Hyperlipidemia, unspecified: Secondary | ICD-10-CM | POA: Diagnosis not present

## 2016-11-19 DIAGNOSIS — R079 Chest pain, unspecified: Secondary | ICD-10-CM | POA: Diagnosis not present

## 2016-11-19 DIAGNOSIS — Z9889 Other specified postprocedural states: Secondary | ICD-10-CM | POA: Diagnosis not present

## 2016-11-19 DIAGNOSIS — Z7984 Long term (current) use of oral hypoglycemic drugs: Secondary | ICD-10-CM | POA: Insufficient documentation

## 2016-11-19 DIAGNOSIS — N402 Nodular prostate without lower urinary tract symptoms: Secondary | ICD-10-CM | POA: Insufficient documentation

## 2016-11-19 DIAGNOSIS — N529 Male erectile dysfunction, unspecified: Secondary | ICD-10-CM | POA: Insufficient documentation

## 2016-11-19 DIAGNOSIS — R0789 Other chest pain: Principal | ICD-10-CM

## 2016-11-19 DIAGNOSIS — R072 Precordial pain: Secondary | ICD-10-CM

## 2016-11-19 LAB — BASIC METABOLIC PANEL
Anion gap: 10 (ref 5–15)
BUN: 22 mg/dL — AB (ref 6–20)
CHLORIDE: 100 mmol/L — AB (ref 101–111)
CO2: 26 mmol/L (ref 22–32)
CREATININE: 1.08 mg/dL (ref 0.61–1.24)
Calcium: 9.3 mg/dL (ref 8.9–10.3)
GFR calc Af Amer: >60 mL/min (ref >60)
GFR calc non Af Amer: >60 mL/min (ref >60)
Glucose, Bld: 97 mg/dL (ref 65–99)
POTASSIUM: 4.4 mmol/L (ref 3.5–5.1)
SODIUM: 136 mmol/L (ref 135–145)

## 2016-11-19 LAB — CBC
HEMATOCRIT: 38.5 % — AB (ref 39.0–52.0)
Hemoglobin: 12.7 g/dL — ABNORMAL LOW (ref 13.0–17.0)
MCH: 27.2 pg (ref 26.0–34.0)
MCHC: 33 g/dL (ref 30.0–36.0)
MCV: 82.4 fL (ref 78.0–100.0)
PLATELETS: 174 10*3/uL (ref 150–400)
RBC: 4.67 MIL/uL (ref 4.22–5.81)
RDW: 13.4 % (ref 11.5–15.5)
WBC: 6.1 10*3/uL (ref 4.0–10.5)

## 2016-11-19 LAB — GLUCOSE, CAPILLARY: Glucose-Capillary: 234 mg/dL — ABNORMAL HIGH (ref 65–99)

## 2016-11-19 LAB — TROPONIN I: Troponin I: <0.03 ng/mL (ref <0.03)

## 2016-11-19 MED ORDER — ASPIRIN 81 MG PO CHEW
162.0000 mg | CHEWABLE_TABLET | Freq: Once | ORAL | Status: AC
  Administered 2016-11-19: 162 mg via ORAL
  Filled 2016-11-19: qty 2

## 2016-11-19 NOTE — Progress Notes (Signed)
Patient transferred to 3 [address] via [place]. Patient alert and.  oriented. Admission assessment complete. Patient oriented to room CCMD notified. Family at bedside. Will continue to monitor patient.

## 2016-11-19 NOTE — Progress Notes (Signed)
83 yo M with hx cerebrovascular disease, DM, HLD who presents with intermittent chest pain yesterday and today just PTA.   BP 118/76 (BP Location: Right Arm)   Pulse (!) 54   Temp 97.7 F (36.5 C) (Oral)   Resp 18   Ht 5\' 8"  (1.727 m)   Wt 70.8 kg (156 lb)   SpO2 98%   BMI 23.72 kg/m   ECG normal and troponin. CXR clear.   HEART 4, pain resolved. To tele OBS.

## 2016-11-19 NOTE — ED Provider Notes (Signed)
[facility] DEPT [facility] Provider Note   CSN: [id number] Arrival date & time: 11/19/16  1851   By signing my name below, I, Eunice Blase, attest that this documentation has been prepared under the direction and in the presence of Oleta Mouse, Eugenia Mcalpine, MD. Electronically signed, Eunice Blase, ED Scribe. 11/19/16. 8:15 PM.  History   Chief Complaint Chief Complaint  Patient presents with  . Chest Pain   The history is provided by the patient, medical records and a relative. No language interpreter was used.    Marco [name] is a 83 y.o. male with h/o DM presenting to the Emergency Department concerning episodic chest pains since last night. He states his pain began today ~5PM while he was in bed and subsided prior to triaging. Pt currently c/o headache. Associated weakness and dizziness with chest pain, per triage note. He describes throbbing pain that lasted ~15-20 minutes. He states he took 81 mg Asprin to relieve his symptoms PTA. No radiation of pain. No known modifying factors including: eating, walking and deep breathing. No h/o heart disorders, HLD, HTN. No significant FMHx. No tobacco use. No SOB, nausea, vomiting, abdominal pain.  Past Medical History:  Diagnosis Date  . Amaurosis fugax 2017  . Diabetes mellitus   . DJD (degenerative joint disease) 08/25/2013  . Elevated PSA   . Other and unspecified hyperlipidemia 04/25/2013    Patient Active Problem List   Diagnosis Date Noted  . Chest pain 11/19/2016  . 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 PCP NOTES >>> 03/08/2015  . Osteopenia 09/15/2014  . Primary osteoarthritis of both knees 03/16/2014  . Hearing difficulty 10/20/2013  . DJD (degenerative joint disease) 08/25/2013  . Neck mass 04/25/2013  . Hyperlipidemia 04/25/2013  . Erectile dysfunction 12/26/2011  . Annual physical exam 08/22/2011  . Prostate nodule 08/22/2011  . Diabetes mellitus ([place]) 05/07/2011    Past Surgical History:  Procedure Laterality Date  .  CATARACT EXTRACTION     Bilaterally  . TYMPANIC MEMBRANE REPAIR     R       Home Medications    Prior to Admission medications   Medication Sig Start Date End Date Taking? Authorizing Provider  ACCU-CHEK AVIVA PLUS test strip CHECK BLOOD SUGAR NO MORE THAN TWICE DAILY. 10/08/15   Colon Branch, MD  ACCU-CHEK SOFTCLIX LANCETS lancets CHECK BLOOD SUGAR NO MORE THAN TWICE DAILY. 10/08/15   Colon Branch, MD  aspirin EC 81 MG tablet Take 81 mg by mouth daily.    [provider]  Blood Glucose Calibration (ACCU-CHEK AVIVA) SOLN USE TO CALIBRATE GLUCOSE MACHINE AS NEEDED. 10/08/15   Colon Branch, MD  Blood Glucose Monitoring Suppl (ACCU-CHEK AVIVA PLUS) w/Device KIT USE DEVICE TO CHECK BLOOD SUGAR AS DIRECTED. 10/10/15   Colon Branch, MD  Cyanocobalamin (VITAMIN B12 PO) Take 1 tablet by mouth daily. 08/29/14   [provider]  metFORMIN (GLUCOPHAGE) 500 MG tablet Take 2 tablets (1,000 mg total) by mouth 2 (two) times daily with a meal. THE PATIENT CAN NOT SWALLOW LARGE PILLS 07/24/16   Colon Branch, MD  pravastatin (PRAVACHOL) 40 MG tablet Take 1 tablet (40 mg total) by mouth daily. 10/08/15   Colon Branch, MD  sildenafil (REVATIO) 20 MG tablet Take 2-3 tablets (40-60 mg total) by mouth daily as needed. Patient not taking: Reported on 03/17/2016 02/20/16   Colon Branch, MD  sitaGLIPtin (JANUVIA) 100 MG tablet Take 1 tablet (100 mg total) by mouth daily. 09/20/15  Colon Branch, MD  tamsulosin (FLOMAX) 0.4 MG CAPS capsule Take 1 capsule (0.4 mg total) by mouth daily. 07/24/16   Colon Branch, MD    Family History Family History  Problem Relation Age of Onset  . Diabetes Neg Hx   . CAD Neg Hx   . Cancer Neg Hx     Social History Social History  Substance Use Topics  . Smoking status: Never Smoker  . Smokeless tobacco: Never Used  . Alcohol use No     Allergies   Patient has no known allergies.   Review of Systems Review of Systems  Respiratory: Negative for shortness of breath.     Cardiovascular: Positive for chest pain.  Gastrointestinal: Negative for abdominal pain, nausea and vomiting.  All other systems reviewed and are negative.    Physical Exam Updated Vital Signs BP 118/76 (BP Location: Right Arm)   Pulse (!) 54   Temp 97.7 F (36.5 C) (Oral)   Resp 18   Ht _0  (1.727 m)   Wt 156 lb (70.8 kg)   SpO2 98%   BMI 23.72 kg/m   Physical Exam Physical Exam  Nursing note and vitals reviewed. Constitutional: Well developed, well nourished, non-toxic, and in no acute distress Head: Normocephalic and atraumatic.  Mouth/Throat: Oropharynx is clear and moist.  Neck: Normal range of motion. Neck supple.  Cardiovascular: Normal rate and regular rhythm.   Pulmonary/Chest: Effort normal and breath sounds normal.  Abdominal: Soft. There is no tenderness. There is no rebound and no guarding.  Musculoskeletal: Normal range of motion. No lower extremity edema or calf tenderness.  Neurological: Alert, no facial droop, fluent speech, moves all extremities symmetrically Skin: Skin is warm and dry.  Psychiatric: Cooperative   ED Treatments / Results  DIAGNOSTIC STUDIES: Oxygen Saturation is 98% on RA, NL by my interpretation.    COORDINATION OF CARE: 8:14 PM-Discussed next steps with pt. Pt verbalized understanding and is agreeable with the plan. Will order ASA, EKG and repeat troponin.   Labs (all labs ordered are listed, but only abnormal results are displayed) Labs Reviewed  BASIC METABOLIC PANEL - Abnormal; Notable for the following:       Result Value   Chloride 100 (*)    BUN 22 (*)    All other components within normal limits  CBC - Abnormal; Notable for the following:    Hemoglobin 12.7 (*)    HCT 38.5 (*)    All other components within normal limits  TROPONIN I    EKG  EKG Interpretation  Date/Time:  Wednesday November 19 2016 18:57:43 EDT Ventricular Rate:  59 PR Interval:    QRS Duration: 94 QT Interval:  395 QTC Calculation: 392 R  Axis:   39 Text Interpretation:  Sinus rhythm Abnormal R-wave progression, early transition no previous EKG  Confirmed by Brantley Stage [phone]) on 11/19/2016 8:17:14 PM       Radiology Dg Chest 2 View  Result Date: 11/19/2016 CLINICAL DATA:  Chest pain EXAM: CHEST  2 VIEW COMPARISON:  None. FINDINGS: Normal heart size. Mildly tortuous atherosclerotic thoracic aorta. Otherwise normal mediastinal contour. No pneumothorax. No pleural effusion. Lungs appear clear, with no acute consolidative airspace disease and no pulmonary edema. IMPRESSION: No active cardiopulmonary disease. Electronically Signed   By: Ilona Sorrel M.D.   On: 11/19/2016 19:57    Procedures Procedures (including critical care time)  Medications Ordered in ED Medications  aspirin chewable tablet 162 mg (162 mg Oral Given 11/19/16  2019)     Initial Impression / Assessment and Plan / ED Course  I have reviewed the triage vital signs and the nursing notes.  Pertinent labs & imaging results that were available during my care of the patient were reviewed by me and considered in my medical decision making (see chart for details).     83 year old male with history of diabetes, hyperlipidemia, amaurosis fugax who presents with chest pain. He is chest pain-free in the emergency department with last episode just prior to arrival. His vital signs are stable. EKG is not acutely ischemic. Troponin 1 is negative. Chest x-ray visualized and shows no acute cardio pulmonary processes. Allergy of his chest pain. Although at this time it is not concerning with dissection or PE. He has a heart score of 4. Discussed with Dr. Loleta Books who will admit to observation for serial troponins and ACS rule out.  Final Clinical Impressions(s) / ED Diagnoses   Final diagnoses:  Precordial chest pain    New Prescriptions New Prescriptions   No medications on file   I personally performed the services described in this documentation, which was scribed  in my presence. The recorded information has been reviewed and is accurate.    Forde Dandy, MD 11/19/16 2107

## 2016-11-19 NOTE — ED Triage Notes (Signed)
Pt c/o intermittent left side chest pain that started last night.  He is currently not experiencing any chest pain, but c/o headache.  The chest pain is associated with weakness and dizziness.

## 2016-11-20 ENCOUNTER — Observation Stay (HOSPITAL_BASED_OUTPATIENT_CLINIC_OR_DEPARTMENT_OTHER): Payer: Medicare HMO

## 2016-11-20 ENCOUNTER — Encounter (HOSPITAL_COMMUNITY): Payer: Self-pay

## 2016-11-20 DIAGNOSIS — R9439 Abnormal result of other cardiovascular function study: Secondary | ICD-10-CM | POA: Diagnosis not present

## 2016-11-20 DIAGNOSIS — E119 Type 2 diabetes mellitus without complications: Secondary | ICD-10-CM | POA: Diagnosis not present

## 2016-11-20 DIAGNOSIS — I259 Chronic ischemic heart disease, unspecified: Secondary | ICD-10-CM | POA: Diagnosis not present

## 2016-11-20 DIAGNOSIS — R0789 Other chest pain: Secondary | ICD-10-CM | POA: Diagnosis not present

## 2016-11-20 DIAGNOSIS — R079 Chest pain, unspecified: Secondary | ICD-10-CM | POA: Diagnosis not present

## 2016-11-20 LAB — GLUCOSE, CAPILLARY
GLUCOSE-CAPILLARY: 128 mg/dL — AB (ref 65–99)
GLUCOSE-CAPILLARY: 110 mg/dL — AB (ref 65–99)
Glucose-Capillary: 167 mg/dL — ABNORMAL HIGH (ref 65–99)
Glucose-Capillary: 158 mg/dL — ABNORMAL HIGH (ref 65–99)

## 2016-11-20 LAB — NM MYOCAR MULTI W/SPECT W/WALL MOTION / EF
CHL CUP MPHR: 139 {beats}/min
CSEPEDS: 54 s
Estimated workload: 4.6 METS
Exercise duration (min): 3 min
Peak HR: 144 {beats}/min
Percent HR: 103 %
RPE: 15
Rest HR: 48 {beats}/min

## 2016-11-20 LAB — TROPONIN I
Troponin I: <0.03 ng/mL (ref <0.03)
Troponin I: <0.03 ng/mL (ref <0.03)

## 2016-11-20 LAB — LIPID PANEL
CHOLESTEROL: 160 mg/dL (ref 0–200)
HDL: 40 mg/dL — ABNORMAL LOW (ref >40)
LDL Cholesterol: 97 mg/dL (ref 0–99)
Total CHOL/HDL Ratio: 4 RATIO
Triglycerides: 113 mg/dL (ref <150)
VLDL: 23 mg/dL (ref 0–40)

## 2016-11-20 MED ORDER — PRAVASTATIN SODIUM 40 MG PO TABS
40.0000 mg | ORAL_TABLET | Freq: Every day | ORAL | Status: DC
  Administered 2016-11-21: 40 mg via ORAL
  Filled 2016-11-20: qty 1

## 2016-11-20 MED ORDER — ONDANSETRON HCL 4 MG/2ML IJ SOLN: 4.0000 mg | Freq: Four times a day (QID) | INTRAMUSCULAR | Status: DC | PRN

## 2016-11-20 MED ORDER — INSULIN ASPART 100 UNIT/ML ~~LOC~~ SOLN
0.0000 [IU] | SUBCUTANEOUS | Status: DC
  Administered 2016-11-20: 1 [IU] via SUBCUTANEOUS

## 2016-11-20 MED ORDER — TECHNETIUM TC 99M TETROFOSMIN IV KIT
30.0000 | PACK | Freq: Once | INTRAVENOUS | Status: AC | PRN
  Administered 2016-11-20: 30 via INTRAVENOUS

## 2016-11-20 MED ORDER — ACETAMINOPHEN 325 MG PO TABS
650.0000 mg | ORAL_TABLET | ORAL | Status: DC | PRN
  Administered 2016-11-20: 650 mg via ORAL
  Filled 2016-11-20: qty 2

## 2016-11-20 MED ORDER — GI COCKTAIL ~~LOC~~: 30.0000 mL | Freq: Four times a day (QID) | ORAL | Status: DC | PRN

## 2016-11-20 MED ORDER — TECHNETIUM TC 99M TETROFOSMIN IV KIT
10.0000 | PACK | Freq: Once | INTRAVENOUS | Status: AC | PRN
  Administered 2016-11-20: 10 via INTRAVENOUS

## 2016-11-20 MED ORDER — TAMSULOSIN HCL 0.4 MG PO CAPS
0.4000 mg | ORAL_CAPSULE | Freq: Every day | ORAL | Status: DC
  Administered 2016-11-21: 0.4 mg via ORAL
  Filled 2016-11-20: qty 1

## 2016-11-20 MED ORDER — ENOXAPARIN SODIUM 40 MG/0.4ML ~~LOC~~ SOLN
40.0000 mg | SUBCUTANEOUS | Status: DC
  Filled 2016-11-20: qty 0.4

## 2016-11-20 MED ORDER — ASPIRIN EC 81 MG PO TBEC
81.0000 mg | DELAYED_RELEASE_TABLET | Freq: Every day | ORAL | Status: DC
  Administered 2016-11-21: 81 mg via ORAL
  Filled 2016-11-20: qty 1

## 2016-11-20 MED ORDER — LINAGLIPTIN 5 MG PO TABS
5.0000 mg | ORAL_TABLET | Freq: Every day | ORAL | Status: DC
  Administered 2016-11-21: 5 mg via ORAL
  Filled 2016-11-20: qty 1

## 2016-11-20 NOTE — Consult Note (Signed)
Cardiology Consultation:   Patient ID: ADVAY VOLANTE; [id number]; 1933-11-13   Admit date: 11/19/2016 Date of Consult: 11/20/2016  Primary Care Provider: Colon Branch, MD Primary Cardiologist: New - Dr. Sallyanne Kuster   Patient Profile:   Marco [name] is a 83 y.o. male with a hx of amauosis fugax, NIDDM and hyperlipidemia who is being seen today for the evaluation of chest pain at the request of Dr. Loleta Books.  History of Present Illness:   Mr. Kmetz developed sudden onset of left-sided nonradiating, moderate intensity chest discomfort and intermittent through the day yesterday. The patient tells me that he developed left-sided chest pain which she is very nondescript about at around 5 PM yesterday. This was nonradiating and not associated with any shortness of breath, diaphoresis, nausea. He did have some mild dizziness. He took one 81 mg aspirin and the pain resolved after about 30 minutes. This occurred while he was sitting down watching television. He does admit to 2 or 3 previous episodes that were very brief over the previous day. One of which was during sleep and he associated the left sided chest pain with lying on his left side. He is somewhat difficult to understand in his descriptions. He has had no prior exertional chest pain or dyspnea. He has no orthopnea, PND or edema.  He has never smoked and does not drink alcohol. He has no known family cardiac history. He has been previously prescribed pravastatin for hyperlipidemia however he stopped taking this because his cholesterol level was less than 200. His diabetes is well controlled on oral agents.  Pertinent findings include: EKG shows Sinus rhythm at 59 bpm with Abnormal R-wave progression, early transition, no acute ischemic changes Troponins negative 3,  normal potassium and kidney function,  hemoglobin 12.7 Chest x-ray shows no active cardiopulmonary disease An echocardiogram in 11/2015 showed normal LV systolic function with  EF 65-70%, mild aortic  regurgitation and grade 1 diastolic dysfunction  Past Medical History:  Diagnosis Date  . Amaurosis fugax 2017  . Diabetes mellitus   . DJD (degenerative joint disease) 08/25/2013  . Elevated PSA   . Other and unspecified hyperlipidemia 04/25/2013    Past Surgical History:  Procedure Laterality Date  . CATARACT EXTRACTION     Bilaterally  . TYMPANIC MEMBRANE REPAIR     R     Inpatient Medications: Scheduled Meds: . aspirin EC  81 mg Oral Daily  . enoxaparin (LOVENOX) injection  40 mg Subcutaneous Q24H  . insulin aspart  0-9 Units Subcutaneous Q4H  . linagliptin  5 mg Oral Daily  . pravastatin  40 mg Oral Daily  . tamsulosin  0.4 mg Oral Daily   Continuous Infusions:  PRN Meds: acetaminophen, gi cocktail, ondansetron (ZOFRAN) IV  Allergies:   No Known Allergies  Social History:   Social History   Social History  . Marital status: Married    Spouse name: N/A  . Number of children: 3  . Years of education: N/A   Occupational History  . Retired    Social History Main Topics  . Smoking status: Never Smoker  . Smokeless tobacco: Never Used  . Alcohol use No  . Drug use: No  . Sexual activity: Not on file   Other Topics Concern  . Not on file   Social History Narrative   Moved to the [place] area 2012     Original from [place] , Married     Lives w/ wife, son and his family  2 daughters, 1 son      Family History:   The patient's family history includes Other in his father and mother. There is no history of Diabetes, CAD, or Cancer.  ROS:  Please see the history of present illness.  ROS  All other ROS reviewed and negative.     Physical Exam/Data:   Vitals:   11/19/16 1900 11/19/16 2138 11/19/16 2309 11/20/16 0434  BP: 118/76 140/66 (!) 108/58 (!) 101/53  Pulse: (!) 54 (!) 57 (!) 58 (!) 48  Resp: 18 16 18 18   Temp: 97.7 F (36.5 C) 97.6 F (36.4 C) 97.7 F (36.5 C) 97.7 F (36.5 C)  TempSrc: Oral Oral Oral Oral  SpO2: 98%  100% 99% 100%  Weight: 156 lb (70.8 kg)  142 lb 14.4 oz (64.8 kg) 142 lb 3.2 oz (64.5 kg)  Height: 5\' 8"  (1.727 m)  5\' 8"  (1.727 m)    No intake or output data in the 24 hours ending 11/20/16 0940 Filed Weights   11/19/16 1900 11/19/16 2309 11/20/16 0434  Weight: 156 lb (70.8 kg) 142 lb 14.4 oz (64.8 kg) 142 lb 3.2 oz (64.5 kg)   Body mass index is 21.62 kg/m.  General:  Well nourished, well developed, in no acute distress HEENT: normal Lymph: no adenopathy Neck: no JVD Endocrine:  No thryomegaly Vascular: No carotid bruits; FA pulses 2+ bilaterally without bruits  Cardiac:  normal S1, S2; RRR; no murmur  Lungs:  clear to auscultation bilaterally, no wheezing, rhonchi or rales  Abd: soft, nontender, no hepatomegaly  Ext: no edema Musculoskeletal:  No deformities, BUE and BLE strength normal and equal Skin: warm and dry  Neuro:  CNs 2-12 intact, no focal abnormalities noted Psych:  Normal affect   EKG:  The EKG was personally reviewed and demonstrates:  Sinus rhythm at 59 bpm with Abnormal R-wave progression, early transition, no acute ischemic changes Telemetry:  Telemetry was personally reviewed and demonstrates:  Sinus bradycardia in the 40s and 50s during the night, with rates up to the 70s when he is up and moving.  Relevant CV Studies:  Echocardiogram 11/26/15 Study Conclusions  - Left ventricle: The cavity size was normal. Wall thickness was   normal. Systolic function was vigorous. The estimated ejection   fraction was in the range of 65% to 70%. Doppler parameters are   consistent with abnormal left ventricular relaxation (grade 1   diastolic dysfunction). - Aortic valve: There was mild regurgitation.  Carotid ultrasound 02/21/16 Minimal to moderate amount of bilateral atherosclerotic plaque, left subjectively greater than right, not resulting in a hemodynamically significant stenosis within either internal carotid artery.  Laboratory Data:  Chemistry  Recent  Labs Lab 11/19/16 1925  NA 136  K 4.4  CL 100*  CO2 26  GLUCOSE 97  BUN 22*  CREATININE 1.08  CALCIUM 9.3  GFRNONAA >60  GFRAA >60  ANIONGAP 10    No results for input(s): PROT, ALBUMIN, AST, ALT, ALKPHOS, BILITOT in the last 168 hours. Hematology  Recent Labs Lab 11/19/16 1925  WBC 6.1  RBC 4.67  HGB 12.7*  HCT 38.5*  MCV 82.4  MCH 27.2  MCHC 33.0  RDW 13.4  PLT 174   Cardiac Enzymes  Recent Labs Lab 11/19/16 1925 11/20/16 0336 11/20/16 0716  TROPONINI <0.03 <0.03 <0.03   No results for input(s): TROPIPOC in the last 168 hours.  BNPNo results for input(s): BNP, PROBNP in the last 168 hours.  DDimer No results for input(s): DDIMER  in the last 168 hours.  Radiology/Studies:  Dg Chest 2 View  Result Date: 11/19/2016 CLINICAL DATA:  Chest pain EXAM: CHEST  2 VIEW COMPARISON:  None. FINDINGS: Normal heart size. Mildly tortuous atherosclerotic thoracic aorta. Otherwise normal mediastinal contour. No pneumothorax. No pleural effusion. Lungs appear clear, with no acute consolidative airspace disease and no pulmonary edema. IMPRESSION: No active cardiopulmonary disease. Electronically Signed   By: Ilona Sorrel M.D.   On: 11/19/2016 19:57    Assessment and Plan:   Chest pain -No previous cardiac history. -Left sided chest pain without associated symptoms except for mild dizziness. No previous exertional type chest discomfort or dyspnea. No signs or symptoms of heart failure. -EKG shows Sinus rhythm at 59 bpm with Abnormal R-wave progression, early transition, no acute ischemic changes -Troponins have been negative for 3 occurrences -Chest x-ray shows no active cardiopulmonary disease -CVD risk factors include diabetes which is well controlled and mild hyperlipidemia. -Will check nuclear stress test today. If negative, patient may be discharged this afternoon.  Diabetes type 2 -Most recent hemoglobin A1c was 6.9 on 07/24/2016. Appears fairly well-controlled on  metformin and Januvia  Hyperlipidemia -Previously treated with pravastatin 40 mg, but patient stopped taking this as his total cholesterol was less than 200. LDL on 02/20/16 was 92   Signed, Daune Perch, NP  11/20/2016 9:40 AM   I have seen and examined the patient along with Daune Perch, NP.  I have reviewed the chart, notes and new data.  I agree with PA/NP's note.  Key new complaints: Chest pain symptoms are nonspecific-atypical. Key examination changes: Normal cardiovascular examination.  Key new findings / data: ECG and biomarkers and not show any high risk findings. A nuclear stress test shows intermediate risk findings, with combination of mostly scar and some ischemia in the inferolateral basal segment. Echo performed roughly one year ago for amaurosis fugax showed normal findings.  PLAN: Discussed options for further treatment. The patient and his family would like to know more details about the severity of his heart disease and prefer invasive evaluation with coronary angiography. This is quite reasonable considering his age and the presence of diabetes mellitus, since the nuclear study may underestimate the true extent of "balanced" ischemia. This procedure has been fully reviewed with the patient and written informed consent has been obtained. Even in the absence of obstructive coronary artery disease, target LDL should be under 100 since he has diabetes mellitus. If we identify coronary disease, target LDL under 70. Either way he should be on a statin. Sanda Klein, MD, [place] [phone] 11/20/2016, 7:09 PM

## 2016-11-20 NOTE — Progress Notes (Signed)
11/19/2016  6:55 PM  11/20/2016 10:39 AM  Marco [name] was seen and examined.  The H&P by the admitting provider, orders, imaging was reviewed.  Please see new orders.  Will continue to follow.   Murvin Natal, MD [facility]

## 2016-11-20 NOTE — Progress Notes (Signed)
nuc study was abnormal and Dr.Croitoru discussed with family 45 min. And plan is for cardiac cath tomorrow.   Pt and family agreeable.    Pt would like Dr. Sallyanne Kuster to call his Rosalee Kaufman a cardiologist 11/21/16 Dr. Mayo Ao  [phone].

## 2016-11-20 NOTE — H&P (Signed)
History and Physical  Patient Name: Marco [name]     MRN:[id number]    DOB: 02/24/1934    DOA: 11/19/2016 PCP: Paz, Jose E, MD   Patient coming from: Home --> [facility]     Chief Complaint: Chest pain  HPI: Marco [name] is a 83 y.o. male with a past medical history significant for amaurosis fugax, NIDDM who presents with chest pain.  The patient was in his usual state of health until yesterday the day before when he was afflicted by new spontaneous, left-sided, nonradiating, moderate intensity chest discomfort. This went away by itself, but then again today at 4 PM, while he was lying in bed, the pain returned, lasted for 20 or 30 minutes, was again left-sided, moderate intensity, nonradiating, associated with dizziness, no palpitations. His family brought him to the emergency room, and his pain resolved before arrival.  ED course: -Afebrile, heart rate 54, respirations and pulse ox normal, blood pressure 118/76 -Initial ECG showed normal sinus rhythm and troponin was negative. -Na 136, K 4.4, Cr 1.08, WBC 6.1, Hgb 12.7 -Chest x-ray clear -He was given aspirin 162 (he taken 2 baby aspirin at home) -TRH was asked to admit for observation, serial troponins and risk stratification.     Review of Systems:  Review of Systems  Cardiovascular: Positive for chest pain.  Neurological: Positive for dizziness.  All other systems reviewed and are negative.    Past Medical History:  Diagnosis Date  . Amaurosis fugax 2017  . Diabetes mellitus   . DJD (degenerative joint disease) 08/25/2013  . Elevated PSA   . Other and unspecified hyperlipidemia 04/25/2013    Past Surgical History:  Procedure Laterality Date  . CATARACT EXTRACTION     Bilaterally  . TYMPANIC MEMBRANE REPAIR     R    Social History: Patient lives with his family.  Patient walks uanssisted.  He is not a smoker.    No Known Allergies  Family history: Reviewed and negative for coronary disease or  stroke.  Prior to Admission medications   Medication Sig Start Date End Date Taking? Authorizing Provider  ACCU-CHEK AVIVA PLUS test strip CHECK BLOOD SUGAR NO MORE THAN TWICE DAILY. 10/08/15   Paz, Jose E, MD  ACCU-CHEK SOFTCLIX LANCETS lancets CHECK BLOOD SUGAR NO MORE THAN TWICE DAILY. 10/08/15   Paz, Jose E, MD  aspirin EC 81 MG tablet Take 81 mg by mouth daily.    [provider]  Blood Glucose Calibration (ACCU-CHEK AVIVA) SOLN USE TO CALIBRATE GLUCOSE MACHINE AS NEEDED. 10/08/15   Paz, Jose E, MD  Blood Glucose Monitoring Suppl (ACCU-CHEK AVIVA PLUS) w/Device KIT USE DEVICE TO CHECK BLOOD SUGAR AS DIRECTED. 10/10/15   Paz, Jose E, MD  Cyanocobalamin (VITAMIN B12 PO) Take 1 tablet by mouth daily. 08/29/14   [provider]  metFORMIN (GLUCOPHAGE) 500 MG tablet Take 2 tablets (1,000 mg total) by mouth 2 (two) times daily with a meal. THE PATIENT CAN NOT SWALLOW LARGE PILLS 07/24/16   Paz, Jose E, MD  pravastatin (PRAVACHOL) 40 MG tablet Take 1 tablet (40 mg total) by mouth daily. 10/08/15   Paz, Jose E, MD  sildenafil (REVATIO) 20 MG tablet Take 2-3 tablets (40-60 mg total) by mouth daily as needed. Patient not taking: Reported on 03/17/2016 02/20/16   Paz, Jose E, MD  sitaGLIPtin (JANUVIA) 100 MG tablet Take 1 tablet (100 mg total) by mouth daily. 09/20/15   Paz, Jose E, MD  tamsulosin (FLOMAX) 0.4 MG CAPS   capsule Take 1 capsule (0.4 mg total) by mouth daily. 07/24/16   Colon Branch, MD       Physical Exam: BP (!) 108/58 (BP Location: Left Arm)   Pulse (!) 58   Temp 97.7 F (36.5 C) (Oral)   Resp 18   Ht 5' 8" (1.727 m)   Wt 64.8 kg (142 lb 14.4 oz)   SpO2 99%   BMI 21.73 kg/m  General appearance: Well-developed, adult male, alert and in no acute distress, sleeping, easily aroused.   Eyes: Anicteric, conjunctiva pink, lids and lashes normal.     ENT: No nasal deformity, discharge, or epistaxis.  OP moist without lesions.   Skin: Warm and dry.   Cardiac: RRR, nl S1-S2, no  murmurs appreciated.  Capillary refill is brisk.  JVP not visible.  No LE edema.  Radial and DP pulses 2+ and symmetric.  No carotid bruits. Respiratory: Normal respiratory rate and rhythm.  CTAB without rales or wheezes. GI: Abdomen soft without rigidity.  No TTP. No ascites, distension.   MSK: No deformities or effusions.   Pain not reproduced with palpation of precordium.  No pain with arm movement. Neuro: Sensorium intact and responding to questions, attention normal.  Speech is fluent.  Moves all extremities equally and with normal coordination.    Psych: Behavior appropriate.  Affect normal.  No evidence of aural or visual hallucinations or delusions.       Labs on Admission:  The metabolic panel shows normal electro lites and renal function. The complete blood count shows no leukocytosis, anemia, thrombocytopenia. The initial troponin is negative.  Radiological Exams on Admission: Personally reviewed chest x-ray shows no focal opacities or airspace disease: Dg Chest 2 View  Result Date: 11/19/2016 CLINICAL DATA:  Chest pain EXAM: CHEST  2 VIEW COMPARISON:  None. FINDINGS: Normal heart size. Mildly tortuous atherosclerotic thoracic aorta. Otherwise normal mediastinal contour. No pneumothorax. No pleural effusion. Lungs appear clear, with no acute consolidative airspace disease and no pulmonary edema. IMPRESSION: No active cardiopulmonary disease. Electronically Signed   By: Ilona Sorrel M.D.   On: 11/19/2016 19:57    EKG: Independently reviewed. Rate 59, QTC 392, no ST changes.    Assessment/Plan  1. Chest pain: This is new.  The patient has HEART score of 4. Angina is atypical.  Other potential causes of chest pain (PE, dissection, pancreatitis, pneumonia/effusion, pericarditis) are doubted.  We have been asked to admit the patient for observation and etiology consultation with Cardiology tomorrow.  -Serial troponins are ordered -Telemetry -Consult to cardiology, appreciate  recommendations    2. Diabetes:  -Hold metformin -Low dose SSRI if needed  3. History of amaurosis fugax:  -Continue aspirin and statin  4. Other medications:  -Continue Flomax          DVT prophylaxis: Lovenox Diet: NPO after 4am for anticipated stress testing Code Status: FULL  Family Communication: Son at bedside, grandson (who is a cardiologist) by phone. Overnight plan discussed, all questions answered.  Disposition Plan: Anticipate overnight observation for arrhythmia on telemetry, serial troponins and subsequent risk stratification by Cardiology.  If testing negative, home after. Consults called: Cardiology by inbaskset Admission status: Telemetry   Medical decision making: I was called by flow nurse at 1:57AM and patient seen at 2:05 AM on 11/20/2016.  The patient was discussed with Dr. Oleta Mouse. What exists of the patient's chart was reviewed in depth.  Clinical condition: stable.      Edwin Dada [facility] Pager  [phone]

## 2016-11-20 NOTE — Progress Notes (Addendum)
exercise portion of Exercise stress myoview completed without complications.

## 2016-11-21 ENCOUNTER — Encounter (HOSPITAL_COMMUNITY): Payer: Self-pay | Admitting: Family Medicine

## 2016-11-21 ENCOUNTER — Encounter (HOSPITAL_COMMUNITY)
Admission: EM | Disposition: A | Discharge status: Home / Self Care | Payer: Self-pay | Source: Home / Self Care | Attending: Emergency Medicine

## 2016-11-21 DIAGNOSIS — R079 Chest pain, unspecified: Secondary | ICD-10-CM | POA: Diagnosis not present

## 2016-11-21 DIAGNOSIS — R0789 Other chest pain: Secondary | ICD-10-CM | POA: Diagnosis not present

## 2016-11-21 DIAGNOSIS — E119 Type 2 diabetes mellitus without complications: Secondary | ICD-10-CM | POA: Diagnosis not present

## 2016-11-21 DIAGNOSIS — R9439 Abnormal result of other cardiovascular function study: Secondary | ICD-10-CM | POA: Diagnosis not present

## 2016-11-21 DIAGNOSIS — E785 Hyperlipidemia, unspecified: Secondary | ICD-10-CM | POA: Diagnosis not present

## 2016-11-21 DIAGNOSIS — I259 Chronic ischemic heart disease, unspecified: Secondary | ICD-10-CM | POA: Diagnosis not present

## 2016-11-21 LAB — GLUCOSE, CAPILLARY
GLUCOSE-CAPILLARY: 137 mg/dL — AB (ref 65–99)
GLUCOSE-CAPILLARY: 128 mg/dL — AB (ref 65–99)
Glucose-Capillary: 115 mg/dL — ABNORMAL HIGH (ref 65–99)
Glucose-Capillary: 102 mg/dL — ABNORMAL HIGH (ref 65–99)

## 2016-11-21 LAB — BASIC METABOLIC PANEL
Anion gap: 7 (ref 5–15)
BUN: 15 mg/dL (ref 6–20)
CALCIUM: 9.4 mg/dL (ref 8.9–10.3)
CHLORIDE: 107 mmol/L (ref 101–111)
CO2: 27 mmol/L (ref 22–32)
CREATININE: 1.01 mg/dL (ref 0.61–1.24)
GFR calc non Af Amer: >60 mL/min (ref >60)
Glucose, Bld: 130 mg/dL — ABNORMAL HIGH (ref 65–99)
Potassium: 4.4 mmol/L (ref 3.5–5.1)
SODIUM: 141 mmol/L (ref 135–145)

## 2016-11-21 LAB — CBC
HCT: 38.6 % — ABNORMAL LOW (ref 39.0–52.0)
Hemoglobin: 12 g/dL — ABNORMAL LOW (ref 13.0–17.0)
MCH: 26.1 pg (ref 26.0–34.0)
MCHC: 31.1 g/dL (ref 30.0–36.0)
MCV: 84.1 fL (ref 78.0–100.0)
PLATELETS: 170 10*3/uL (ref 150–400)
RBC: 4.59 MIL/uL (ref 4.22–5.81)
RDW: 13.6 % (ref 11.5–15.5)
WBC: 5.5 10*3/uL (ref 4.0–10.5)

## 2016-11-21 LAB — PROTIME-INR
INR: 1.09
Prothrombin Time: 14.2 seconds (ref 11.4–15.2)

## 2016-11-21 LAB — HEMOGLOBIN A1C
Hgb A1c MFr Bld: 6.5 % — ABNORMAL HIGH (ref 4.8–5.6)
MEAN PLASMA GLUCOSE: 140 mg/dL

## 2016-11-21 SURGERY — LEFT HEART CATH AND CORONARY ANGIOGRAPHY
Anesthesia: LOCAL

## 2016-11-21 MED ORDER — SODIUM CHLORIDE 0.9 % WEIGHT BASED INFUSION: 1.0000 mL/kg/h | INTRAVENOUS | Status: DC

## 2016-11-21 MED ORDER — SODIUM CHLORIDE 0.9 % WEIGHT BASED INFUSION: 3.0000 mL/kg/h | INTRAVENOUS | Status: DC

## 2016-11-21 MED ORDER — SODIUM CHLORIDE 0.9 % WEIGHT BASED INFUSION: 3.0000 mL/kg/h | INTRAVENOUS | Status: AC

## 2016-11-21 MED ORDER — SODIUM CHLORIDE 0.9% FLUSH: 3.0000 mL | INTRAVENOUS | Status: DC | PRN

## 2016-11-21 MED ORDER — PRAVASTATIN SODIUM 40 MG PO TABS: 40.0000 mg | ORAL_TABLET | Freq: Every day | ORAL | 0 refills | Status: DC

## 2016-11-21 MED ORDER — ASPIRIN 81 MG PO CHEW
81.0000 mg | CHEWABLE_TABLET | ORAL | Status: DC
Start: 2016-11-21 — End: 2016-11-21

## 2016-11-21 MED ORDER — NITROGLYCERIN 0.4 MG SL SUBL: 0.4000 mg | SUBLINGUAL_TABLET | SUBLINGUAL | 0 refills | Status: DC | PRN

## 2016-11-21 MED ORDER — SODIUM CHLORIDE 0.9 % IV SOLN: 250.0000 mL | INTRAVENOUS | Status: DC | PRN

## 2016-11-21 MED ORDER — ASPIRIN 81 MG PO CHEW: 81.0000 mg | CHEWABLE_TABLET | ORAL | Status: DC

## 2016-11-21 MED ORDER — SODIUM CHLORIDE 0.9% FLUSH
3.0000 mL | Freq: Two times a day (BID) | INTRAVENOUS | Status: DC
Start: 2016-11-21 — End: 2016-11-21

## 2016-11-21 MED ORDER — ASPIRIN 81 MG PO TBEC: 81.0000 mg | DELAYED_RELEASE_TABLET | Freq: Every day | ORAL | Status: DC

## 2016-11-21 NOTE — Discharge Summary (Signed)
Physician Discharge Summary  Marco [name] PFX:[id number] DOB: 1933-05-22 DOA: 11/19/2016  PCP: Colon Branch, MD Cardiologist: Dr. Sallyanne Kuster  Admit date: 11/19/2016 Discharge date: 11/21/2016  Admitted From: Home  Disposition: Home   Recommendations for Outpatient Follow-up:  1. Follow up with PCP in 1 weeks 2. Please follow lipid panel treat to LDL goal of less than 70.  3. Please treat diabetes to A1c goal less than 7% 4. Follow up with cardiology as scheduled  Discharge Condition: Stable   CODE STATUS: FULL    Brief Hospitalization Summary: Please see all hospital notes, images, labs for full details of the hospitalization. HPI: Marco [name] is a 83 y.o. male with a past medical history significant for amaurosis fugax, NIDDM who presents with chest pain.  The patient was in his usual state of health until yesterday the day before when he was afflicted by new spontaneous, left-sided, nonradiating, moderate intensity chest discomfort. This went away by itself, but then again today at 4 PM, while he was lying in bed, the pain returned, lasted for 20 or 30 minutes, was again left-sided, moderate intensity, nonradiating, associated with dizziness, no palpitations. His family brought him to the emergency room, and his pain resolved before arrival.  ED course: -Afebrile, heart rate 54, respirations and pulse ox normal, blood pressure 118/76 -Initial ECG showed normal sinus rhythm and troponin was negative. -Na 136, K 4.4, Cr 1.08, WBC 6.1, Hgb 12.7 -Chest x-ray clear -He was given aspirin 162 (he taken 2 baby aspirin at home) -TRH was asked to admit for observation, serial troponins and risk stratification.  Chest pain -No previous cardiac history. -Left sided chest pain without associated symptoms except for mild dizziness. No previous exertional type chest discomfort or dyspnea. No signs or symptoms of heart failure. -EKG shows Sinus rhythm at 59 bpm with Abnormal R-wave  progression, early transition, no acute ischemic changes -Troponins have been negative for 3 occurrences -Chest x-ray shows no active cardiopulmonary disease -CVD risk factors include diabetes which is well controlled and mild hyperlipidemia.  - nuclear stress test was completed 7/19 and was reported as intermediate study  Blood pressure demonstrated a normal response to exercise, however poor exercise effort of 3:54.  There was no ST segment deviation noted during stress.  Nuclear stress EF: 54%. Basal inferior hypokinesis otherwise normal.  Defect 1: There is a small defect of moderate severity present in the basal inferoseptal location.  Findings consistent with prior myocardial infarction. Can not exclude attenuation artifact. This is a intermediate risk study based upon poor exercise effort (dyspnea, but no chest pain) and possible basal inferoseptal infarct pattern. Pt decided not to pursue cardiac cath and to treat medically.  Medical therapy is a very reasonable option. Cardiology recommend restarting statin with target LDL cholesterol less than 100, preferably less than 70. Treat diabetes to target hemoglobin A1c under 7%. Aspirin 81 mg daily. Sublingual nitroglycerin to be used as needed, return to emergency room for chest discomfort lasting more than 30 minutes not relieved by 3 consecutive sublingual nitroglycerin tablets.  Diabetes type 2 -Most recent hemoglobin A1c was 6.9 on 07/24/2016. Appears fairly well-controlled on metformin and Januvia  Hyperlipidemia -Previously treated with pravastatin 40 mg, but patient stopped taking this as his total cholesterol was less than 200. LDL on 02/20/16 was 92.  He was restarted on pravastatin 40 mg daily at discharge.    Discharge Diagnoses:  Principal Problem:   Chest pain Active Problems:   Diabetes mellitus ([place])  Discharge Instructions: Discharge Instructions    Call MD for:  difficulty breathing, headache or visual disturbances     Complete by:  As directed    Call MD for:  persistant dizziness or light-headedness    Complete by:  As directed    Call MD for:  persistant nausea and vomiting    Complete by:  As directed    Call MD for:  severe uncontrolled pain    Complete by:  As directed    Increase activity slowly    Complete by:  As directed      Allergies as of 11/21/2016   No Known Allergies     Medication List    TAKE these medications   aspirin 81 MG EC tablet Take 1 tablet (81 mg total) by mouth daily.   doxycycline 50 MG capsule Commonly known as:  VIBRAMYCIN Take 50 mg by mouth 2 (two) times daily. For ten days   metFORMIN 500 MG tablet Commonly known as:  GLUCOPHAGE Take 2 tablets (1,000 mg total) by mouth 2 (two) times daily with a meal. THE PATIENT CAN NOT SWALLOW LARGE PILLS   nitroGLYCERIN 0.4 MG SL tablet Commonly known as:  NITROSTAT Place 1 tablet (0.4 mg total) under the tongue every 5 (five) minutes as needed for chest pain.   pravastatin 40 MG tablet Commonly known as:  PRAVACHOL Take 1 tablet (40 mg total) by mouth daily.      Follow-up Information    [facility] EM[facility]ENC[facility] DEPART[facility] Follow up.   Specialty:  Emergency Medicine Why:  If symptoms worsen Contact information: [address] [phone] [address] [place]       Sanda Klein, MD. Go on 03/02/2017.   Specialty:  Cardiology Why:  @ 9:40 am for post hospital follow up Contact information: [place] [address] [phone]        Colon Branch, MD. Schedule an appointment as soon as possible for a visit in 1 week(s).   Specialty:  Internal Medicine Why:  Hospital Follow Up  Contact information: [address] [place] [address] [phone]          No Known Allergies Current Discharge Medication List    START taking these medications   Details  nitroGLYCERIN (NITROSTAT) 0.4 MG SL tablet  Place 1 tablet (0.4 mg total) under the tongue every 5 (five) minutes as needed for chest pain. Qty: 20 tablet, Refills: 0      CONTINUE these medications which have CHANGED   Details  aspirin EC 81 MG EC tablet Take 1 tablet (81 mg total) by mouth daily.    pravastatin (PRAVACHOL) 40 MG tablet Take 1 tablet (40 mg total) by mouth daily. Qty: 30 tablet, Refills: 0      CONTINUE these medications which have NOT CHANGED   Details  doxycycline (VIBRAMYCIN) 50 MG capsule Take 50 mg by mouth 2 (two) times daily. For ten days Refills: 0    metFORMIN (GLUCOPHAGE) 500 MG tablet Take 2 tablets (1,000 mg total) by mouth 2 (two) times daily with a meal. THE PATIENT CAN NOT SWALLOW LARGE PILLS Qty: 360 tablet, Refills: 3      STOP taking these medications     sitaGLIPtin (JANUVIA) 100 MG tablet      tamsulosin (FLOMAX) 0.4 MG CAPS capsule         Procedures/Studies: Dg Chest 2 View  Result Date: 11/19/2016 CLINICAL DATA:  Chest pain EXAM: CHEST  2 VIEW COMPARISON:  None. FINDINGS: Normal heart size. Mildly tortuous atherosclerotic thoracic aorta. Otherwise normal mediastinal contour. No pneumothorax. No pleural effusion. Lungs appear clear, with no acute consolidative airspace disease and no pulmonary edema. IMPRESSION: No active cardiopulmonary disease. Electronically Signed   By: Ilona Sorrel M.D.   On: 11/19/2016 19:57   Nm Myocar Multi W/spect W/wall Motion / Ef  Result Date: 11/20/2016  Blood pressure demonstrated a normal response to exercise, however poor exercise effort of 3:54.  There was no ST segment deviation noted during stress.  Nuclear stress EF: 54%. Basal inferior hypokinesis otherwise normal.  Defect 1: There is a small defect of moderate severity present in the basal inferoseptal location.  Findings consistent with prior myocardial infarction. Can not exclude attenuation artifact.  This is a intermediate risk study based upon poor exercise effort (dyspnea, but no  chest pain) and possible basal inferoseptal infarct pattern.  Candee Furbish, MD      Subjective: Pt without chest pain or SOB, asking to go home.   Discharge Exam: Vitals:   11/21/16 0101 11/21/16 0554  BP: (!) 103/57 115/64  Pulse: (!) 53 (!) 55  Resp: 18 18  Temp: 97.8 F (36.6 C) 97.8 F (36.6 C)   Vitals:   11/20/16 2116 11/21/16 0101 11/21/16 0400 11/21/16 0554  BP: 102/60 (!) 103/57  115/64  Pulse: (!) 54 (!) 53  (!) 55  Resp: 18 18  18   Temp: 97.7 F (36.5 C) 97.8 F (36.6 C)  97.8 F (36.6 C)  TempSrc: Oral Oral  Oral  SpO2: 99% 98%  100%  Weight:   64.1 kg (141 lb 4.8 oz)   Height:       General: Pt is alert, awake, not in acute distress Cardiovascular:  normal S1/S2 +, no rubs, no gallops Respiratory: CTA bilaterally, no wheezing, no rhonchi Abdominal: Soft, NT, ND, bowel sounds + Extremities:  no cyanosis   The results of significant diagnostics from this hospitalization (including imaging, microbiology, ancillary and laboratory) are listed below for reference.     Microbiology: No results found for this or any previous visit (from the past 240 hour(s)).   Labs: BNP (last 3 results) No results for input(s): BNP in the last 8760 hours. Basic Metabolic Panel:  Recent Labs Lab 11/19/16 1925 11/21/16 0407  NA 136 141  K 4.4 4.4  CL 100* 107  CO2 26 27  GLUCOSE 97 130*  BUN 22* 15  CREATININE 1.08 1.01  CALCIUM 9.3 9.4   Liver Function Tests: No results for input(s): AST, ALT, ALKPHOS, BILITOT, PROT, ALBUMIN in the last 168 hours. No results for input(s): LIPASE, AMYLASE in the last 168 hours. No results for input(s): AMMONIA in the last 168 hours. CBC:  Recent Labs Lab 11/19/16 1925 11/21/16 0407  WBC 6.1 5.5  HGB 12.7* 12.0*  HCT 38.5* 38.6*  MCV 82.4 84.1  PLT 174 170   Cardiac Enzymes:  Recent Labs Lab 11/19/16 1925 11/20/16 0336 11/20/16 0716  TROPONINI <0.03 <0.03 <0.03   BNP: Invalid input(s): POCBNP CBG:  Recent  Labs Lab 11/20/16 1630 11/20/16 2111 11/21/16 0144 11/21/16 0355 11/21/16 0750  GLUCAP 167* 158* 137* 128* 115*   D-Dimer No results for input(s): DDIMER in the last 72 hours. Hgb A1c  Recent Labs  11/20/16 0716  HGBA1C 6.5*   Lipid Profile  Recent Labs  11/20/16 0329  CHOL 160  HDL 40*  LDLCALC 97  TRIG 113  CHOLHDL 4.0   Thyroid function studies No results for input(s): TSH, T4TOTAL, T3FREE, THYROIDAB in the last 72 hours.  Invalid input(s): FREET3 Anemia work up No results for input(s): VITAMINB12, FOLATE, FERRITIN, TIBC, IRON, RETICCTPCT in the last 72 hours. Urinalysis    Component Value Date/Time   COLORURINE YELLOW 07/24/2016 0908   APPEARANCEUR CLEAR 07/24/2016 0908   LABSPEC >=1.030 (A) 07/24/2016 0908   PHURINE 5.5 07/24/2016 0908   GLUCOSEU NEGATIVE 07/24/2016 0908   HGBUR NEGATIVE 07/24/2016 0908   BILIRUBINUR NEGATIVE 07/24/2016 0908   KETONESUR NEGATIVE 07/24/2016 0908   UROBILINOGEN 0.2 07/24/2016 0908   NITRITE NEGATIVE 07/24/2016 0908   LEUKOCYTESUR NEGATIVE 07/24/2016 0908   Sepsis Labs Invalid input(s): PROCALCITONIN,  WBC,  LACTICIDVEN Microbiology No results found for this or any previous visit (from the past 240 hour(s)).  Time coordinating discharge:   SIGNED:  Irwin Brakeman, MD  [facility] 11/21/2016, 10:37 AM Pager [phone]  If 7PM-7AM, please contact night-coverage [email or web address] Password [email or web address]

## 2016-11-21 NOTE — Progress Notes (Signed)
Progress Note  Patient Name: Marco [name] Date of Encounter: 11/21/2016  Primary Cardiologist: New  Subjective   He has changed his mind and prefers medical therapy. Wants to cancel cardiac catheterization. He has not had any further chest pain.  Inpatient Medications    Scheduled Meds: . aspirin  81 mg Oral Pre-Cath  . aspirin EC  81 mg Oral Daily  . enoxaparin (LOVENOX) injection  40 mg Subcutaneous Q24H  . insulin aspart  0-9 Units Subcutaneous Q4H  . linagliptin  5 mg Oral Daily  . pravastatin  40 mg Oral Daily  . sodium chloride flush  3 mL Intravenous Q12H  . tamsulosin  0.4 mg Oral Daily   Continuous Infusions: . sodium chloride    . sodium chloride     PRN Meds: sodium chloride, acetaminophen, gi cocktail, ondansetron (ZOFRAN) IV, sodium chloride flush   Vital Signs    Vitals:   11/20/16 2116 11/21/16 0101 11/21/16 0400 11/21/16 0554  BP: 102/60 (!) 103/57  115/64  Pulse: (!) 54 (!) 53  (!) 55  Resp: 18 18  18   Temp: 97.7 F (36.5 C) 97.8 F (36.6 C)  97.8 F (36.6 C)  TempSrc: Oral Oral  Oral  SpO2: 99% 98%  100%  Weight:   141 lb 4.8 oz (64.1 kg)   Height:        Intake/Output Summary (Last 24 hours) at 11/21/16 0829 Last data filed at 11/21/16 0814  Gross per 24 hour  Intake              820 ml  Output                0 ml  Net              820 ml   Filed Weights   11/19/16 2309 11/20/16 0434 11/21/16 0400  Weight: 142 lb 14.4 oz (64.8 kg) 142 lb 3.2 oz (64.5 kg) 141 lb 4.8 oz (64.1 kg)    Telemetry    NSr - Personally Reviewed  ECG    NSR - Personally Reviewed  Physical Exam  Calm, relaxed GEN: No acute distress.   Neck: No JVD Cardiac: RRR, no murmurs, rubs, or gallops.  Respiratory: Clear to auscultation bilaterally. GI: Soft, nontender, non-distended  MS: No edema; No deformity. Neuro:  Nonfocal  Psych: Normal affect   Labs    Chemistry Recent Labs Lab 11/19/16 1925 11/21/16 0407  NA 136 141  K 4.4 4.4  CL  100* 107  CO2 26 27  GLUCOSE 97 130*  BUN 22* 15  CREATININE 1.08 1.01  CALCIUM 9.3 9.4  GFRNONAA >60 >60  GFRAA >60 >60  ANIONGAP 10 7     Hematology Recent Labs Lab 11/19/16 1925 11/21/16 0407  WBC 6.1 5.5  RBC 4.67 4.59  HGB 12.7* 12.0*  HCT 38.5* 38.6*  MCV 82.4 84.1  MCH 27.2 26.1  MCHC 33.0 31.1  RDW 13.4 13.6  PLT 174 170    Cardiac Enzymes Recent Labs Lab 11/19/16 1925 11/20/16 0336 11/20/16 0716  TROPONINI <0.03 <0.03 <0.03   No results for input(s): TROPIPOC in the last 168 hours.   BNPNo results for input(s): BNP, PROBNP in the last 168 hours.   DDimer No results for input(s): DDIMER in the last 168 hours.   Radiology    Dg Chest 2 View  Result Date: 11/19/2016 CLINICAL DATA:  Chest pain EXAM: CHEST  2 VIEW COMPARISON:  None. FINDINGS: Normal heart  size. Mildly tortuous atherosclerotic thoracic aorta. Otherwise normal mediastinal contour. No pneumothorax. No pleural effusion. Lungs appear clear, with no acute consolidative airspace disease and no pulmonary edema. IMPRESSION: No active cardiopulmonary disease. Electronically Signed   By: Ilona Sorrel M.D.   On: 11/19/2016 19:57   Nm Myocar Multi W/spect W/wall Motion / Ef  Result Date: 11/20/2016  Blood pressure demonstrated a normal response to exercise, however poor exercise effort of 3:54.  There was no ST segment deviation noted during stress.  Nuclear stress EF: 54%. Basal inferior hypokinesis otherwise normal.  Defect 1: There is a small defect of moderate severity present in the basal inferoseptal location.  Findings consistent with prior myocardial infarction. Can not exclude attenuation artifact.  This is a intermediate risk study based upon poor exercise effort (dyspnea, but no chest pain) and possible basal inferoseptal infarct pattern.  Candee Furbish, MD    Cardiac Studies   Nuclear stress test 11/20/2016  Blood pressure demonstrated a normal response to exercise, however poor exercise  effort of 3:54.  There was no ST segment deviation noted during stress.  Nuclear stress EF: 54%. Basal inferior hypokinesis otherwise normal.  Defect 1: There is a small defect of moderate severity present in the basal inferoseptal location.  Findings consistent with prior myocardial infarction. Can not exclude attenuation artifact.  This is a intermediate risk study based upon poor exercise effort (dyspnea, but no chest pain) and possible basal inferoseptal infarct pattern.  Patient Profile     83 y.o. male with atypical chest symptoms, low risk ECG and biomarkers, normal echo one year ago, intermediate risk nuclear stress test with a small basal inferior defect that is mostly fixed.  Assessment & Plan    Her medical therapy is a very reasonable option. Recommend restarting statin with target LDL cholesterol less than 100, preferably less than 70. Treat diabetes to target hemoglobin A1c under 7%. Aspirin 81 mg daily. Sublingual nitroglycerin to be used as needed, return to emergency room for chest discomfort lasting more than 30 minutes not relieved by 3 consecutive sublingual nitroglycerin tablets.  Signed, Sanda Klein, MD  11/21/2016, 8:29 AM

## 2016-11-21 NOTE — Progress Notes (Signed)
  Pt. Refusing cath prep this am. Pt. Stated he has changed his mind about having the procedure and that he's just going to take medicine. Anderson Malta, RN (cath lab) made aware. MD to see pt. On coming RN made aware of situation.

## 2016-11-21 NOTE — Progress Notes (Signed)
11/21/2016 8:17 AM  Pt reporting that he changed mind about having cath done.  He wants to be medically managed.  He is asking about going home.  I asked him to please speak with the cardiology team before being discharged.  He agreed to do that.    Murvin Natal MD

## 2016-11-21 NOTE — Progress Notes (Signed)
Progress Note  Patient Name: Marco [name] Date of Encounter: 11/21/2016  Primary Cardiologist: New- Dr. Sallyanne Kuster  Subjective   The patient has had no further chest pain, no shortness of breath, no lightheadedness. He is declining cardiac catheterization today.  Inpatient Medications    Scheduled Meds: . aspirin  81 mg Oral Pre-Cath  . aspirin EC  81 mg Oral Daily  . enoxaparin (LOVENOX) injection  40 mg Subcutaneous Q24H  . insulin aspart  0-9 Units Subcutaneous Q4H  . linagliptin  5 mg Oral Daily  . pravastatin  40 mg Oral Daily  . sodium chloride flush  3 mL Intravenous Q12H  . tamsulosin  0.4 mg Oral Daily   Continuous Infusions: . sodium chloride    . sodium chloride     PRN Meds: sodium chloride, acetaminophen, gi cocktail, ondansetron (ZOFRAN) IV, sodium chloride flush   Vital Signs    Vitals:   11/20/16 2116 11/21/16 0101 11/21/16 0400 11/21/16 0554  BP: 102/60 (!) 103/57  115/64  Pulse: (!) 54 (!) 53  (!) 55  Resp: 18 18  18   Temp: 97.7 F (36.5 C) 97.8 F (36.6 C)  97.8 F (36.6 C)  TempSrc: Oral Oral  Oral  SpO2: 99% 98%  100%  Weight:   141 lb 4.8 oz (64.1 kg)   Height:        Intake/Output Summary (Last 24 hours) at 11/21/16 0258 Last data filed at 11/21/16 0814  Gross per 24 hour  Intake              820 ml  Output                0 ml  Net              820 ml   Filed Weights   11/19/16 2309 11/20/16 0434 11/21/16 0400  Weight: 142 lb 14.4 oz (64.8 kg) 142 lb 3.2 oz (64.5 kg) 141 lb 4.8 oz (64.1 kg)    Telemetry    Sinus bradycardia/sinus rhythm with resting rates in the 40s to 50s and rates in the 70s when active - Personally Reviewed  ECG    No new tracings  Physical Exam   GEN: No acute distress.   Neck: No JVD Cardiac: RRR, no murmurs, rubs, or gallops.  Respiratory: Clear to auscultation bilaterally. GI: Soft, nontender, non-distended  MS: No edema; No deformity. Neuro:  Nonfocal  Psych: Normal affect   Labs      Chemistry Recent Labs Lab 11/19/16 1925 11/21/16 0407  NA 136 141  K 4.4 4.4  CL 100* 107  CO2 26 27  GLUCOSE 97 130*  BUN 22* 15  CREATININE 1.08 1.01  CALCIUM 9.3 9.4  GFRNONAA >60 >60  GFRAA >60 >60  ANIONGAP 10 7     Hematology Recent Labs Lab 11/19/16 1925 11/21/16 0407  WBC 6.1 5.5  RBC 4.67 4.59  HGB 12.7* 12.0*  HCT 38.5* 38.6*  MCV 82.4 84.1  MCH 27.2 26.1  MCHC 33.0 31.1  RDW 13.4 13.6  PLT 174 170    Cardiac Enzymes Recent Labs Lab 11/19/16 1925 11/20/16 0336 11/20/16 0716  TROPONINI <0.03 <0.03 <0.03   No results for input(s): TROPIPOC in the last 168 hours.   BNPNo results for input(s): BNP, PROBNP in the last 168 hours.   DDimer No results for input(s): DDIMER in the last 168 hours.   Radiology    Dg Chest 2 View  Result Date: 11/19/2016  CLINICAL DATA:  Chest pain EXAM: CHEST  2 VIEW COMPARISON:  None. FINDINGS: Normal heart size. Mildly tortuous atherosclerotic thoracic aorta. Otherwise normal mediastinal contour. No pneumothorax. No pleural effusion. Lungs appear clear, with no acute consolidative airspace disease and no pulmonary edema. IMPRESSION: No active cardiopulmonary disease. Electronically Signed   By: Ilona Sorrel M.D.   On: 11/19/2016 19:57   Nm Myocar Multi W/spect W/wall Motion / Ef  Result Date: 11/20/2016  Blood pressure demonstrated a normal response to exercise, however poor exercise effort of 3:54.  There was no ST segment deviation noted during stress.  Nuclear stress EF: 54%. Basal inferior hypokinesis otherwise normal.  Defect 1: There is a small defect of moderate severity present in the basal inferoseptal location.  Findings consistent with prior myocardial infarction. Can not exclude attenuation artifact.  This is a intermediate risk study based upon poor exercise effort (dyspnea, but no chest pain) and possible basal inferoseptal infarct pattern.  Candee Furbish, MD    Cardiac Studies   Exercise myoview  11/20/16  Blood pressure demonstrated a normal response to exercise, however poor exercise effort of 3:54.  There was no ST segment deviation noted during stress.  Nuclear stress EF: 54%. Basal inferior hypokinesis otherwise normal.  Defect 1: There is a small defect of moderate severity present in the basal inferoseptal location.  Findings consistent with prior myocardial infarction. Can not exclude attenuation artifact.  This is a intermediate risk study based upon poor exercise effort (dyspnea, but no chest pain) and possible basal inferoseptal infarct pattern.  Echocardiogram 11/26/15 Study Conclusions  - Left ventricle: The cavity size was normal. Wall thickness was   normal. Systolic function was vigorous. The estimated ejection   fraction was in the range of 65% to 70%. Doppler parameters are   consistent with abnormal left ventricular relaxation (grade 1   diastolic dysfunction). - Aortic valve: There was mild regurgitation.  Patient Profile     83 y.o. male with a hx of amauosis fugax, NIDDM and hyperlipidemia who is being seen today for the evaluation of chest pain. No previous cardiac history, nonsmoker.   Assessment & Plan    Chest pain -No previous cardiac history.  -Mostly atypical- non-radiating with no associated symptoms except mild dizziness. No chest pain since presenting episode. No signs of heart failure. -EKG with abnormal R-wave progression, early transition, no ischemic changes -Troponins were negative and chest x-ray was normal -CVD risk factors include diabetes which is well controlled and mild hyperlipidemia -Myocardial perfusion study of yesterday showed EF 54% with basal inferior hypokinesis, small defect of moderate severity present in the basal inferoseptal location. Findings are consistent with prior MI. Cannot exclude attenuation artifact. This is considered an intermediate risk study based on poor exercise effort with dyspnea but no chest pain  and possible basal inferoseptal infarct pattern. -The patient has been scheduled for a cardiac catheterization today for further definitive cardiac evaluation, however, he has conferred with his grandson who is a cardiologist in [place] and he feels that a cardiac catheterization is not necessary at this time. He would like to go home on medical therapy and consider cardiac catheterization if he has further chest pain. I explained the possibility of a blockage/partial blockage of the coronary artery that would best be evaluated with cardiac catheterization. He continues to defer cardiac cath at this time. Dr. Sallyanne Kuster will see pt.   Diabetes type 2 -Most recent hemoglobin A1c was 6.9 on 07/24/2016. Appears fairly well-controlled on metformin  and Januvia  Hyperlipidemia -Previously treated with pravastatin 40 mg, but patient stopped taking this as his total cholesterol was less than 200. Currently LDL is 97. With his diabetes and potential CAD would set target LDL at less than 70 and resume statin.  Signed, Daune Perch, NP  11/21/2016, 8:22 AM    Agree - see also my note. Sanda Klein, MD, [facility] [phone] office [phone] pager

## 2016-11-21 NOTE — Progress Notes (Signed)
Pt has orders to be discharged. Discharge instructions given and pt has no additional questions at this time. Medication regimen reviewed and pt educated. Pt verbalized understanding and has no additional questions. Telemetry box removed. IV removed and site in good condition. Pt stable and waiting for transportation. 

## 2016-11-21 NOTE — Discharge Instructions (Signed)
Sublingual nitroglycerin to be used as needed, return to emergency room for chest discomfort lasting more than 30 minutes not relieved by 3 consecutive sublingual nitroglycerin tablets.    Follow with Primary MD  Colon Branch, MD  and other consultant's as instructed your Hospitalist MD  Please get a complete blood count and chemistry panel checked by your Primary MD at your next visit, and again as instructed by your Primary MD.  Get Medicines reviewed and adjusted: Please take all your medications with you for your next visit with your Primary MD  Laboratory/radiological data: Please request your Primary MD to go over all hospital tests and procedure/radiological results at the follow up, please ask your Primary MD to get all Hospital records sent to his/her office.  In some cases, they will be blood work, cultures and biopsy results pending at the time of your discharge. Please request that your primary care M.D. follows up on these results.  Also Note the following: If you experience worsening of your admission symptoms, develop shortness of breath, life threatening emergency, suicidal or homicidal thoughts you must seek medical attention immediately by calling 911 or calling your MD immediately  if symptoms less severe.  You must read complete instructions/literature along with all the possible adverse reactions/side effects for all the Medicines you take and that have been prescribed to you. Take any new Medicines after you have completely understood and accpet all the possible adverse reactions/side effects.   Do not drive when taking Pain medications or sleeping medications (Benzodaizepines)  Do not take more than prescribed Pain, Sleep and Anxiety Medications. It is not advisable to combine anxiety,sleep and pain medications without talking with your primary care practitioner  Special Instructions: If you have smoked or chewed Tobacco  in the last 2 yrs please stop smoking, stop any  regular Alcohol  and or any Recreational drug use.  Wear Seat belts while driving.  Please note: You were cared for by a hospitalist during your hospital stay. Once you are discharged, your primary care physician will handle any further medical issues. Please note that NO REFILLS for any discharge medications will be authorized once you are discharged, as it is imperative that you return to your primary care physician (or establish a relationship with a primary care physician if you do not have one) for your post hospital discharge needs so that they can reassess your need for medications and monitor your lab values.

## 2016-11-26 ENCOUNTER — Telehealth: Payer: Self-pay | Admitting: Behavioral Health

## 2016-11-26 NOTE — Telephone Encounter (Signed)
Hospital Follow-up Call  PCP: Colon Branch, MD Cardiologist: Dr. Sallyanne Kuster  Admit date: 11/19/2016 Discharge date: 11/21/2016  Admitted From: Home  Disposition: Home   Recommendations for Outpatient Follow-up:  1. Follow up with PCP in 1 weeks 2. Please follow lipid panel treat to LDL goal of less than 70.  3. Please treat diabetes to A1c goal less than 7% 4. Follow up with cardiology as scheduled  Discharge Condition: Stable     How have you been since you were released from the hospital? Patient stated, "I'm doing fine".    Where were you discharged to? Home   Items Reviewed:  Medications reviewed: yes  Allergies reviewed: yes, NKA  Dietary changes reviewed: patient reported no changes to diet.  Referrals reviewed: yes, follow-up with PCP & cardiologist.   Any transportation issues/concerns?: no   Any patient concerns? no   Confirmed importance and date/time of follow-up visits scheduled: Patient requested to keep his 4 month follow-up appointment with PCP (previously scheduled), as opposed to switching it to a hospital follow-up visit.  Confirmed with patient if condition begins to worsen call PCP or go to the ER.  Patient was given the office number and encouraged to call back with question or concerns.  : yes

## 2016-11-26 NOTE — Telephone Encounter (Signed)
Patient requested to keep his 4 month follow-up appointment with PCP (previously scheduled), as opposed to switching it to a hospital follow-up visit. Appointment is tomorrow, 11/27/16 at 8:45 AM with Dr. Larose Kells.

## 2016-11-27 ENCOUNTER — Ambulatory Visit (INDEPENDENT_AMBULATORY_CARE_PROVIDER_SITE_OTHER): Payer: Medicare HMO | Admitting: Internal Medicine

## 2016-11-27 ENCOUNTER — Encounter: Payer: Self-pay | Admitting: Internal Medicine

## 2016-11-27 VITALS — BP 107/71 | HR 61 | Temp 97.7°F | Ht 68.0 in | Wt 141.0 lb

## 2016-11-27 DIAGNOSIS — E785 Hyperlipidemia, unspecified: Secondary | ICD-10-CM | POA: Diagnosis not present

## 2016-11-27 DIAGNOSIS — I259 Chronic ischemic heart disease, unspecified: Secondary | ICD-10-CM

## 2016-11-27 DIAGNOSIS — E1159 Type 2 diabetes mellitus with other circulatory complications: Secondary | ICD-10-CM

## 2016-11-27 NOTE — Progress Notes (Signed)
Pre visit review using our clinic review tool, if applicable. No additional management support is needed unless otherwise documented below in the visit note. 

## 2016-11-27 NOTE — Patient Instructions (Signed)
Please schedule a office  visit 2 months from today, fasting

## 2016-11-27 NOTE — Assessment & Plan Note (Signed)
Chest pain: Admitted w/ CP, stress test intermediate risk, likely has underlying CAD, declined a cardiac catheterization and elected medical therapy. Currently asx, continue aspirin, has NTG but has not needed it. Ideal LDL 70, A1c goal below 7.0. Return in the need to go to the ER if CP, unusual shortness of breath or edema. DM: Currently on metformin and Januvia, last A1c satisfactory High cholesterol: was not taking Pravachol consistently in the last few months, LDL few days ago 97. Rec statins daily and recheck on RTC. RTC 2 months

## 2016-11-27 NOTE — Progress Notes (Signed)
Subjective:    Patient ID: Marco [name], male    DOB: 06/18/1933, 83 y.o.   MRN: [id number]  DOS:  11/27/2016 Type of visit - description : Hospital follow-up Interval history: Admitted to the hospital and discharged 11/21/2016 Was admitted with chest pain, troponins negative, stress test intermediate risk, patient decided not to pursue catheterization, cardiology saw the patient, they recommend statins, ideal LDL 70. A1c under 7.0. rx Aspirin 81. Labs reviewed: Last creatinine 1.0, potassium 4.4, hemoglobin 12.0. LDL 97, A1C 6.5  Review of Systems Since he left the hospital, he is taking medications correctly. No ambulatory CBGs. Denies any more chest pain. No difficulty breathing or lower extremity edema Denies nausea, vomiting, diarrhea. No visual disturbances  Past Medical History:  Diagnosis Date  . Amaurosis fugax 2017  . Diabetes mellitus   . DJD (degenerative joint disease) 08/25/2013  . Elevated PSA   . Other and unspecified hyperlipidemia 04/25/2013    Past Surgical History:  Procedure Laterality Date  . CATARACT EXTRACTION     Bilaterally  . TYMPANIC MEMBRANE REPAIR     R    Social History   Social History  . Marital status: Married    Spouse name: N/A  . Number of children: 3  . Years of education: N/A   Occupational History  . Retired    Social History Main Topics  . Smoking status: Never Smoker  . Smokeless tobacco: Never Used  . Alcohol use No  . Drug use: No  . Sexual activity: Not on file   Other Topics Concern  . Not on file   Social History Narrative   Moved to the [place] area 2012     Original from [place] , Married     Lives w/ wife, son and his family    2 daughters, 1 son        Allergies as of 11/27/2016   No Known Allergies     Medication List       Accurate as of 11/27/16  7:22 PM. Always use your most recent med list.          aspirin 81 MG EC tablet Take 1 tablet (81 mg total) by mouth daily.   metFORMIN 500 MG  tablet Commonly known as:  GLUCOPHAGE Take 2 tablets (1,000 mg total) by mouth 2 (two) times daily with a meal. THE PATIENT CAN NOT SWALLOW LARGE PILLS   nitroGLYCERIN 0.4 MG SL tablet Commonly known as:  NITROSTAT Place 1 tablet (0.4 mg total) under the tongue every 5 (five) minutes as needed for chest pain.   pravastatin 40 MG tablet Commonly known as:  PRAVACHOL Take 1 tablet (40 mg total) by mouth daily.   sitaGLIPtin 100 MG tablet Commonly known as:  JANUVIA Take 100 mg by mouth daily.          Objective:   Physical Exam BP 107/71 (BP Location: Left Arm, Patient Position: Sitting, Cuff Size: Small)   Pulse 61   Temp 97.7 F (36.5 C) (Oral)   Ht 5\' 8"  (1.727 m)   Wt 141 lb (64 kg)   SpO2 100%   BMI 21.44 kg/m  General:   Well developed, well nourished . NAD.  HEENT:  Normocephalic . Face symmetric, atraumatic Lungs:  CTA B Normal respiratory effort, no intercostal retractions, no accessory muscle use. Heart: RRR,  no murmur.  No pretibial edema bilaterally  Skin: Not pale. Not jaundice Neurologic:  alert & oriented X3.  Speech normal, gait  appropriate for age , assisted by a cane Psych--  Cognition and judgment appear intact.  Cooperative with normal attention span and concentration.  Behavior appropriate. No anxious or depressed appearing.      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 Currently asx, continue aspirin, has NTG but has not needed it. Ideal  LDL 70, A1c goal below 7.0. Return in the need to go to the ER if CP, unusual shortness of breath or edema. DM: Currently on metformin and Januvia, last A1c satisfactory High cholesterol: was not taking Pravachol consistently in the last few months, LDL few days ago 97. Rec statins daily and recheck on RTC. RTC 2 months

## 2016-12-25 DIAGNOSIS — Z7984 Long term (current) use of oral hypoglycemic drugs: Secondary | ICD-10-CM | POA: Diagnosis not present

## 2016-12-25 DIAGNOSIS — H47393 Other disorders of optic disc, bilateral: Secondary | ICD-10-CM | POA: Diagnosis not present

## 2016-12-25 DIAGNOSIS — Z961 Presence of intraocular lens: Secondary | ICD-10-CM | POA: Diagnosis not present

## 2016-12-25 DIAGNOSIS — H5213 Myopia, bilateral: Secondary | ICD-10-CM | POA: Diagnosis not present

## 2016-12-25 DIAGNOSIS — H524 Presbyopia: Secondary | ICD-10-CM | POA: Diagnosis not present

## 2016-12-25 DIAGNOSIS — H52203 Unspecified astigmatism, bilateral: Secondary | ICD-10-CM | POA: Diagnosis not present

## 2016-12-25 DIAGNOSIS — E119 Type 2 diabetes mellitus without complications: Secondary | ICD-10-CM | POA: Diagnosis not present

## 2016-12-25 LAB — HM DIABETES EYE EXAM

## 2017-01-19 ENCOUNTER — Telehealth: Payer: Self-pay | Admitting: Internal Medicine

## 2017-01-19 NOTE — Telephone Encounter (Signed)
Can you assist w/ this? Pt can never understand me when I call. Dr. Larose Kells has asked for Pt to supply translator when coming to appts and for phone calls (unsure which language he speaks natively) and Pt was offended by this.

## 2017-01-19 NOTE — Telephone Encounter (Signed)
Pt stated has an appt on 01-26-2017 at 8:45 am but pt keep insisting that wants to be called at 8:00 am since he has a son to take to school. Pt was explained can not be called at 8:00 on this day since Dr Larose Kells already has a pt who has an appt at that time for same day. Pt insisted wanted to inform nurse if it can be possible to have the pt called earlier for this appt only since he has a situation. Pt was very difficult to explain and was recommended for another day at 8:00 but pt only wanted appt on Monday, Wednesday or Friday and there was nothing on [facility] schedule til December. Pt still wanted to keep appt for 01-26-2017 at 8:45 and still wanted approval from nurse to be called at 8:00. Please advise pt if possible to see him at 8:00 or 8:45 for this day(01-26-2017).

## 2017-01-19 NOTE — Telephone Encounter (Signed)
If there is a cancellation  I'll be happy to see him at 8 AM otherwise I have to respect other people's appointments

## 2017-01-22 ENCOUNTER — Ambulatory Visit: Payer: Medicare HMO | Admitting: Internal Medicine

## 2017-01-26 ENCOUNTER — Ambulatory Visit: Payer: Medicare HMO | Admitting: Internal Medicine

## 2017-01-26 ENCOUNTER — Ambulatory Visit (INDEPENDENT_AMBULATORY_CARE_PROVIDER_SITE_OTHER): Payer: Medicare HMO | Admitting: Internal Medicine

## 2017-01-26 ENCOUNTER — Encounter: Payer: Self-pay | Admitting: Internal Medicine

## 2017-01-26 VITALS — BP 134/68 | HR 57 | Temp 97.8°F | Resp 14 | Ht 68.0 in | Wt 145.1 lb

## 2017-01-26 DIAGNOSIS — E1159 Type 2 diabetes mellitus with other circulatory complications: Secondary | ICD-10-CM

## 2017-01-26 DIAGNOSIS — E785 Hyperlipidemia, unspecified: Secondary | ICD-10-CM | POA: Diagnosis not present

## 2017-01-26 LAB — CBC WITH DIFFERENTIAL/PLATELET
Basophils Absolute: 0.1 10*3/uL (ref 0.0–0.1)
Basophils Relative: 1.4 % (ref 0.0–3.0)
EOS PCT: 11.9 % — AB (ref 0.0–5.0)
Eosinophils Absolute: 0.6 10*3/uL (ref 0.0–0.7)
HEMATOCRIT: 43.4 % (ref 39.0–52.0)
HEMOGLOBIN: 13.9 g/dL (ref 13.0–17.0)
LYMPHS PCT: 28.8 % (ref 12.0–46.0)
Lymphs Abs: 1.4 10*3/uL (ref 0.7–4.0)
MCHC: 32.2 g/dL (ref 30.0–36.0)
MCV: 84.5 fl (ref 78.0–100.0)
MONO ABS: 0.4 10*3/uL (ref 0.1–1.0)
MONOS PCT: 8.7 % (ref 3.0–12.0)
Neutro Abs: 2.5 10*3/uL (ref 1.4–7.7)
Neutrophils Relative %: 49.2 % (ref 43.0–77.0)
Platelets: 178 10*3/uL (ref 150.0–400.0)
RBC: 5.13 Mil/uL (ref 4.22–5.81)
RDW: 14.1 % (ref 11.5–15.5)
WBC: 5 10*3/uL (ref 4.0–10.5)

## 2017-01-26 LAB — COMPREHENSIVE METABOLIC PANEL
ALBUMIN: 4.3 g/dL (ref 3.5–5.2)
ALK PHOS: 54 U/L (ref 39–117)
ALT: 14 U/L (ref 0–53)
AST: 23 U/L (ref 0–37)
BUN: 14 mg/dL (ref 6–23)
CALCIUM: 10 mg/dL (ref 8.4–10.5)
CO2: 28 mEq/L (ref 19–32)
Chloride: 103 mEq/L (ref 96–112)
Creatinine, Ser: 1.16 mg/dL (ref 0.40–1.50)
GFR: 64.11 mL/min (ref >60.00)
Glucose, Bld: 134 mg/dL — ABNORMAL HIGH (ref 70–99)
POTASSIUM: 4.8 meq/L (ref 3.5–5.1)
Sodium: 140 mEq/L (ref 135–145)
TOTAL PROTEIN: 7.8 g/dL (ref 6.0–8.3)
Total Bilirubin: 0.8 mg/dL (ref 0.2–1.2)

## 2017-01-26 LAB — MICROALBUMIN / CREATININE URINE RATIO
Creatinine,U: 285.5 mg/dL
Microalb Creat Ratio: 1.1 mg/g (ref 0.0–30.0)
Microalb, Ur: 3.1 mg/dL — ABNORMAL HIGH (ref 0.0–1.9)

## 2017-01-26 LAB — HEMOGLOBIN A1C: HEMOGLOBIN A1C: 6.9 % — AB (ref 4.6–6.5)

## 2017-01-26 LAB — LIPID PANEL
Cholesterol: 176 mg/dL (ref 0–200)
HDL: 50.5 mg/dL (ref >39.00)
LDL CALC: 109 mg/dL — AB (ref 0–99)
NONHDL: 125.25
Total CHOL/HDL Ratio: 3
Triglycerides: 83 mg/dL (ref 0.0–149.0)
VLDL: 16.6 mg/dL (ref 0.0–40.0)

## 2017-01-26 NOTE — Patient Instructions (Addendum)
GO TO THE LAB : Get the blood work     GO TO THE FRONT DESK Schedule your next appointment for a physical exam in 4 months 

## 2017-01-26 NOTE — Progress Notes (Signed)
Subjective:    Patient ID: Marco [name], male    DOB: 04-13-1934, 83 y.o.   MRN: [id number]  DOS:  01/26/2017 Type of visit - description : rov Interval history: DM: Not taking medications consistently. Ambulatory CBGs 110 High cholesterol: Reports good compliance with Pravachol.   Review of Systems Denies chest pain difficulty breathing. No lower extremity edema or palpitations From time to time feels acid going up in the chest after meals particularly if meals are very spicy. Decrease with OTC Mylanta (?). No nausea, vomiting, no exertional symptoms  Past Medical History:  Diagnosis Date  . Amaurosis fugax 2017  . Diabetes mellitus   . DJD (degenerative joint disease) 08/25/2013  . Elevated PSA   . Other and unspecified hyperlipidemia 04/25/2013    Past Surgical History:  Procedure Laterality Date  . CATARACT EXTRACTION     Bilaterally  . TYMPANIC MEMBRANE REPAIR     R    Social History   Social History  . Marital status: Married    Spouse name: N/A  . Number of children: 3  . Years of education: N/A   Occupational History  . Retired    Social History Main Topics  . Smoking status: Never Smoker  . Smokeless tobacco: Never Used  . Alcohol use No  . Drug use: No  . Sexual activity: Not on file   Other Topics Concern  . Not on file   Social History Narrative   Moved to the [place] area 2012     Original from [place] , Married     Lives w/ wife, son and his family    2 daughters, 1 son        Allergies as of 01/26/2017   No Known Allergies     Medication List       Accurate as of 01/26/17 11:59 PM. Always use your most recent med list.          aspirin 81 MG EC tablet Take 1 tablet (81 mg total) by mouth daily.   metFORMIN 500 MG tablet Commonly known as:  GLUCOPHAGE Take 500 mg by mouth 2 (two) times daily with a meal.   nitroGLYCERIN 0.4 MG SL tablet Commonly known as:  NITROSTAT Place 1 tablet (0.4 mg total) under the tongue every 5  (five) minutes as needed for chest pain.   pravastatin 40 MG tablet Commonly known as:  PRAVACHOL Take 1 tablet (40 mg total) by mouth daily.   sitaGLIPtin 100 MG tablet Commonly known as:  JANUVIA Take 100 mg by mouth daily.            Discharge Care Instructions        Start     Ordered   01/26/17 0000  Lipid panel     01/26/17 0906   01/26/17 0000  Hemoglobin A1c     01/26/17 0906   01/26/17 0000  Urine Microalbumin w/creat. ratio     01/26/17 0906   01/26/17 0000  Comp Met (CMET)     01/26/17 0906   01/26/17 0000  CBC w/Diff     01/26/17 0906         Objective:   Physical Exam BP 134/68 (BP Location: Left Arm, Patient Position: Sitting, Cuff Size: Small)   Pulse (!) 57   Temp 97.8 F (36.6 C) (Oral)   Resp 14   Ht '5\' 8"'  (1.727 m)   Wt 145 lb 2 oz (65.8 kg)   SpO2 97%  BMI 22.07 kg/m  General:   Well developed, well nourished . NAD.  HEENT:  Normocephalic . Face symmetric, atraumatic Lungs:  CTA B Normal respiratory effort, no intercostal retractions, no accessory muscle use. Heart: RRR,  no murmur.  No pretibial edema bilaterally  Skin: Not pale. Not jaundice Neurologic:  alert & oriented X3.  Speech normal, gait appropriate for age and unassisted Psych--  Cognition and judgment appear intact.  Cooperative with normal attention span and concentration.  Behavior appropriate. No anxious or depressed appearing.      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 Explained the patient the need to have good DM control to prevent CAD-strokes. We agreed to continue metformin 500 mg BID, encouraged to take Januvia daily. Last A1c was 6.5. Will check a CMP, A1c, micro High cholesterol: onPravachol, reports that now is taking it consistently, last LDL 97, goal 70. Checking labs Heartburn: See ROS, recommend to continue with OTC Mylanta, if sx is different or persistent recommend ER (atypical CAD?) Declined a flu shot   RTC 4 months, CPX

## 2017-01-26 NOTE — Progress Notes (Signed)
Pre visit review using our clinic review tool, if applicable. No additional management support is needed unless otherwise documented below in the visit note. 

## 2017-01-27 NOTE — Assessment & Plan Note (Signed)
DM: Currently on metformin 500 mg one tablet twice a day (Rx  2 po BID) and Januvia "sometimes". Explained the patient the need to have good DM control to prevent CAD-strokes. We agreed to continue metformin 500 mg BID, encouraged to take Januvia daily. Last A1c was 6.5. Will check a CMP, A1c, micro High cholesterol: onPravachol, reports that now is taking it consistently, last LDL 97, goal 70. Checking labs Heartburn: See ROS, recommend to continue with OTC Mylanta, if sx is different or persistent recommend ER (atypical CAD?) Declined a flu shot   RTC 4 months, CPX

## 2017-02-02 MED ORDER — ATORVASTATIN CALCIUM 40 MG PO TABS: 40.0000 mg | ORAL_TABLET | Freq: Every day | ORAL | 3 refills | Status: DC

## 2017-02-02 NOTE — Addendum Note (Signed)
Addended byDamita Dunnings D on: 02/02/2017 09:04 AM   Modules accepted: Orders

## 2017-02-06 ENCOUNTER — Telehealth: Payer: Self-pay | Admitting: Internal Medicine

## 2017-02-06 MED ORDER — ATORVASTATIN CALCIUM 40 MG PO TABS: 40.0000 mg | ORAL_TABLET | Freq: Every day | ORAL | 0 refills | Status: DC

## 2017-02-06 NOTE — Telephone Encounter (Signed)
Pt called. It was very difficult to understand specifically what he was asking for. He states prescriptions changed at last visit with Larose Kells and his we may need to send change info to his ins? Please call pt to clarify his request.

## 2017-02-06 NOTE — Telephone Encounter (Signed)
Pt needing atorvastatin 40mg  sent to [facility]. Rx sent.

## 2017-02-10 ENCOUNTER — Other Ambulatory Visit: Payer: Self-pay | Admitting: Internal Medicine

## 2017-02-17 DIAGNOSIS — Z0100 Encounter for examination of eyes and vision without abnormal findings: Secondary | ICD-10-CM | POA: Diagnosis not present

## 2017-03-02 ENCOUNTER — Ambulatory Visit (INDEPENDENT_AMBULATORY_CARE_PROVIDER_SITE_OTHER): Payer: Medicare HMO | Admitting: Cardiovascular Disease

## 2017-03-02 ENCOUNTER — Encounter: Payer: Self-pay | Admitting: Cardiovascular Disease

## 2017-03-02 VITALS — BP 104/58 | HR 58 | Ht 69.0 in | Wt 148.0 lb

## 2017-03-02 DIAGNOSIS — R931 Abnormal findings on diagnostic imaging of heart and coronary circulation: Secondary | ICD-10-CM

## 2017-03-02 DIAGNOSIS — Z79899 Other long term (current) drug therapy: Secondary | ICD-10-CM

## 2017-03-02 DIAGNOSIS — E785 Hyperlipidemia, unspecified: Secondary | ICD-10-CM

## 2017-03-02 DIAGNOSIS — E118 Type 2 diabetes mellitus with unspecified complications: Secondary | ICD-10-CM | POA: Diagnosis not present

## 2017-03-02 DIAGNOSIS — R0789 Other chest pain: Secondary | ICD-10-CM | POA: Diagnosis not present

## 2017-03-02 DIAGNOSIS — R6889 Other general symptoms and signs: Secondary | ICD-10-CM | POA: Diagnosis not present

## 2017-03-02 MED ORDER — EZETIMIBE 10 MG PO TABS: 10.0000 mg | ORAL_TABLET | Freq: Every day | ORAL | 3 refills | Status: DC

## 2017-03-02 NOTE — Progress Notes (Signed)
Cardiology Office Note:    Date:  03/02/2017   ID:  Marco [name], DOB 11/10/1933, MRN [id number]  PCP:  Colon Branch, MD  Cardiologist:  Sanda Klein, MD    Referring MD: Colon Branch, MD   Chief Complaint  Patient presents with  . Follow-up  hospitalization for chest pain  History of Present Illness:    Marco [name] is a 83 y.o. male with a hx of type 2 diabetes mellitus, hyperlipidemia,  history of amaurosis fugax who was hospitalized with chest pain in July.  He underwent a nuclear stress test that showed a fixed defect in the basal inferior septum and no reversible ischemia.  The ejection fraction was 54% and there were no EKG changes during exercise.  He only exercised for just under 4 minutes.  Since that time he has had 3 of 4 new episodes of chest discomfort but has discovered that the symptoms promptly resolve when he takes a calcium carbonate supplement.  He is convinced that they are related to acid reflux.  Past Medical History:  Diagnosis Date  . Amaurosis fugax 2017  . Diabetes mellitus   . DJD (degenerative joint disease) 08/25/2013  . Elevated PSA   . Other and unspecified hyperlipidemia 04/25/2013    Past Surgical History:  Procedure Laterality Date  . CATARACT EXTRACTION     Bilaterally  . TYMPANIC MEMBRANE REPAIR     R    Current Medications: Current Meds  Medication Sig  . ACCU-CHEK AVIVA PLUS test strip CHECK BLOOD SUGAR NO MORE THAN TWICE DAILY  . ACCU-CHEK SOFTCLIX LANCETS lancets CHECK BLOOD SUGAR NO MORE THAN TWICE DAILY.  Marland Kitchen aspirin EC 81 MG EC tablet Take 1 tablet (81 mg total) by mouth daily.  Marland Kitchen atorvastatin (LIPITOR) 40 MG tablet Take 1 tablet (40 mg total) by mouth at bedtime.  . Blood Glucose Calibration (ACCU-CHEK AVIVA) SOLN USE AS DIRECTED TO CALIBRATE GLUCOSE MACHINE AS NEEDED  . metFORMIN (GLUCOPHAGE) 500 MG tablet Take 500 mg by mouth 2 (two) times daily with a meal.  . nitroGLYCERIN (NITROSTAT) 0.4 MG SL tablet Place 1 tablet  (0.4 mg total) under the tongue every 5 (five) minutes as needed for chest pain.  . sitaGLIPtin (JANUVIA) 100 MG tablet Take 1 tablet (100 mg total) by mouth daily.     Allergies:   Patient has no known allergies.   Social History   Social History  . Marital status: Married    Spouse name: N/A  . Number of children: 3  . Years of education: N/A   Occupational History  . Retired    Social History Main Topics  . Smoking status: Never Smoker  . Smokeless tobacco: Never Used  . Alcohol use No  . Drug use: No  . Sexual activity: Not Asked   Other Topics Concern  . None   Social History Narrative   Moved to the [place] area 2012     Original from [place] , Married     Lives w/ wife, son and his family    2 daughters, 1 son       Family History: The patient's family history includes Other in his father and mother. There is no history of Diabetes, CAD, or Cancer. ROS:   Please see the history of present illness.     All other systems reviewed and are negative.  EKGs/Labs/Other Studies Reviewed:    The following studies were reviewed today: Your stress test July 2018, echo 2017  EKG:  EKG is not ordered today. July 19 electrocardiogram shows sinus rhythm and early RS transition without acute ischemic changes  Recent Labs: 01/26/2017: ALT 14; BUN 14; Creatinine, Ser 1.16; Hemoglobin 13.9; Platelets 178.0; Potassium 4.8; Sodium 140  Recent Lipid Panel    Component Value Date/Time   CHOL 176 01/26/2017 0908   TRIG 83.0 01/26/2017 0908   HDL 50.50 01/26/2017 0908   CHOLHDL 3 01/26/2017 0908   VLDL 16.6 01/26/2017 0908   LDLCALC 109 (H) 01/26/2017 0908    Physical Exam:    VS:  BP (!) 104/58   Pulse (!) 58   Ht 5\' 9"  (1.753 m)   Wt 148 lb (67.1 kg)   BMI 21.86 kg/m     Wt Readings from Last 3 Encounters:  03/02/17 148 lb (67.1 kg)  01/26/17 145 lb 2 oz (65.8 kg)  11/27/16 141 lb (64 kg)     GEN:  Well nourished, well developed in no acute distress HEENT:  Normal NECK: No JVD; No carotid bruits LYMPHATICS: No lymphadenopathy CARDIAC: RRR, no murmurs, rubs, gallops RESPIRATORY:  Clear to auscultation without rales, wheezing or rhonchi  ABDOMEN: Soft, non-tender, non-distended MUSCULOSKELETAL:  No edema; No deformity  SKIN: Warm and dry NEUROLOGIC:  Alert and oriented x 3 PSYCHIATRIC:  Normal affect   ASSESSMENT:    1. Dyslipidemia   2. Other chest pain   3. Type 2 diabetes mellitus with complication, without long-term current use of insulin ([place])   4. Abnormal nuclear cardiac imaging test   5. Medication management    PLAN:    In order of problems listed above:  1. Chest pain: Symptoms resolved promptly with antacids and are most likely GI in etiology.  No plan for further cardiac evaluation at this time.  The episodes are infrequent enough that he does not really want to take a long-acting acid blocker. 2. HLP: In the setting of diabetes mellitus, target LDL definitely should be under 100.  We will add Zetia 10 mg once daily to his statin.  I think are more likely to reach targets doing that rather than doubling the already high dose of atorvastatin that he takes.  Ideally, we will bring LDL cholesterol to 70 or less as would be indicated for a diabetic with coronary disease (assuming that his nuclear stress test abnormality is truly evidence of an old infarction). 3. DM: Glycemic control is good. 4. Abn nuclear study: He is quite lean and it is hard to blame the abnormality on diaphragmatic attenuation artifact, although this is still possible.  However his nuclear test is overall a low risk study since there is no evidence of reversible ischemia.   Medication Adjustments/Labs and Tests Ordered: Current medicines are reviewed at length with the patient today.  Concerns regarding medicines are outlined above.  Orders Placed This Encounter  Procedures  . Lipid panel   Meds ordered this encounter  Medications  . DISCONTD: ezetimibe  (ZETIA) 10 MG tablet    Sig: Take 1 tablet (10 mg total) by mouth daily.    Dispense:  90 tablet    Refill:  3  . ezetimibe (ZETIA) 10 MG tablet    Sig: Take 1 tablet (10 mg total) by mouth daily.    Dispense:  90 tablet    Refill:  3    Signed, Sanda Klein, MD  03/02/2017 6:35 PM    [place]

## 2017-03-02 NOTE — Patient Instructions (Signed)
Medication Instructions: Dr Sallyanne Kuster has recommended making the following medication changes: 1. START Zetia 10 mg - take 1 tablet by mouth daily  Labwork: Your physician recommends that you return for lab work in 3 months - FASTING. Okay to have completed with your primary care doctor, Dr Larose Kells.  Testing/Procedures: NONE ORDERED  Follow-up: Dr Sallyanne Kuster recommends that you schedule a follow-up appointment in 12 months. You will receive a reminder letter in the mail two months in advance. If you don't receive a letter, please call our office to schedule the follow-up appointment.  If you need a refill on your cardiac medications before your next appointment, please call your pharmacy.

## 2017-08-10 DIAGNOSIS — R6889 Other general symptoms and signs: Secondary | ICD-10-CM | POA: Diagnosis not present

## 2017-08-14 ENCOUNTER — Encounter: Payer: Medicare HMO | Admitting: Internal Medicine

## 2017-10-01 ENCOUNTER — Ambulatory Visit (INDEPENDENT_AMBULATORY_CARE_PROVIDER_SITE_OTHER): Payer: Medicare HMO | Admitting: Internal Medicine

## 2017-10-01 ENCOUNTER — Encounter: Payer: Self-pay | Admitting: Internal Medicine

## 2017-10-01 ENCOUNTER — Other Ambulatory Visit (INDEPENDENT_AMBULATORY_CARE_PROVIDER_SITE_OTHER): Payer: Medicare HMO

## 2017-10-01 VITALS — BP 116/72 | HR 61 | Temp 98.2°F | Resp 14 | Ht 69.0 in | Wt 147.5 lb

## 2017-10-01 DIAGNOSIS — E039 Hypothyroidism, unspecified: Secondary | ICD-10-CM

## 2017-10-01 DIAGNOSIS — Z0000 Encounter for general adult medical examination without abnormal findings: Secondary | ICD-10-CM

## 2017-10-01 DIAGNOSIS — G8929 Other chronic pain: Secondary | ICD-10-CM

## 2017-10-01 DIAGNOSIS — E7800 Pure hypercholesterolemia, unspecified: Secondary | ICD-10-CM

## 2017-10-01 DIAGNOSIS — E119 Type 2 diabetes mellitus without complications: Secondary | ICD-10-CM | POA: Diagnosis not present

## 2017-10-01 DIAGNOSIS — M25561 Pain in right knee: Secondary | ICD-10-CM

## 2017-10-01 LAB — BASIC METABOLIC PANEL
BUN: 18 mg/dL (ref 6–23)
CALCIUM: 10 mg/dL (ref 8.4–10.5)
CO2: 27 mEq/L (ref 19–32)
CREATININE: 1.35 mg/dL (ref 0.40–1.50)
Chloride: 104 mEq/L (ref 96–112)
GFR: 53.73 mL/min — AB (ref >60.00)
Glucose, Bld: 136 mg/dL — ABNORMAL HIGH (ref 70–99)
Potassium: 4.3 mEq/L (ref 3.5–5.1)
Sodium: 139 mEq/L (ref 135–145)

## 2017-10-01 LAB — LIPID PANEL
CHOL/HDL RATIO: 4
Cholesterol: 174 mg/dL (ref 0–200)
HDL: 43.3 mg/dL (ref >39.00)
LDL Cholesterol: 113 mg/dL — ABNORMAL HIGH (ref 0–99)
NONHDL: 130.69
Triglycerides: 89 mg/dL (ref 0.0–149.0)
VLDL: 17.8 mg/dL (ref 0.0–40.0)

## 2017-10-01 LAB — T4, FREE: FREE T4: 0.72 ng/dL (ref 0.60–1.60)

## 2017-10-01 LAB — T3, FREE: T3 FREE: 3.5 pg/mL (ref 2.3–4.2)

## 2017-10-01 LAB — HEMOGLOBIN A1C: Hgb A1c MFr Bld: 7.3 % — ABNORMAL HIGH (ref 4.6–6.5)

## 2017-10-01 LAB — ALT: ALT: 16 U/L (ref 0–53)

## 2017-10-01 LAB — AST: AST: 24 U/L (ref 0–37)

## 2017-10-01 LAB — TSH: TSH: 11.35 u[IU]/mL — ABNORMAL HIGH (ref 0.35–4.50)

## 2017-10-01 NOTE — Progress Notes (Signed)
Pre visit review using our clinic review tool, if applicable. No additional management support is needed unless otherwise documented below in the visit note. 

## 2017-10-01 NOTE — Patient Instructions (Addendum)
GO TO THE LAB : Get the blood work     GO TO THE FRONT DESK Schedule your next appointment for a checkup in 6 months  Please review the medication list provided carefully. If you think you are not taking the medications as prescribed please call the office and let us know

## 2017-10-01 NOTE — Assessment & Plan Note (Signed)
DM: Currently on metformin, Januvia, check a A1c Neuropathy: Despite the diagnosis, foot exam today is normal High cholesterol: On Lipitor, Zetia added by cardiology 02-2017, checking labs DJD: Most pain is on the right knee, request further eval, refer to sports medicine  Chest pain: Saw cardiology on f/u. No further eval recommended. Compliance: Has a hard time remember medication names, recommend to review medication list carefully and call if there is any discrepancies. RTC 6 months

## 2017-10-01 NOTE — Assessment & Plan Note (Addendum)
--  Declined all immunizations today again, benefits discussed --Reports a previous colonoscopy, no documentation, he was told it was negative. Not interested on further screening. -- elevated PSA, desires no further eval --Diet, exercise discussed; end-of-life care discussed, states his family know his wishes but he doesn't  see any value on having a written living will despite my advise  --Labs: BMP, AST, FLP, A1c, TSH

## 2017-10-01 NOTE — Progress Notes (Signed)
Subjective:    Patient ID: Marco [name], male    DOB: 08/03/1933, 84 y.o.   MRN: [id number]  DOS:  10/01/2017 Type of visit - description : cpx Interval history: No major concerns. When asked about his medications states he is taking everything but has a hard time recognizing any names.   Review of Systems Denies chest pain difficulty breathing. Occasionally gets dizzy when he bends over. Having aches and pains mostly @ Knees R>L  Other than above, a 14 point review of systems is negative      Past Medical History:  Diagnosis Date  . Amaurosis fugax 2017  . Diabetes mellitus   . DJD (degenerative joint disease) 08/25/2013  . Elevated PSA   . Other and unspecified hyperlipidemia 04/25/2013    Past Surgical History:  Procedure Laterality Date  . CATARACT EXTRACTION     Bilaterally  . TYMPANIC MEMBRANE REPAIR     R    Social History   Socioeconomic History  . Marital status: Married    Spouse name: Not on file  . Number of children: 3  . Years of education: Not on file  . Highest education level: Not on file  Occupational History  . Occupation: Retired  Scientific laboratory technician  . Financial resource strain: Not on file  . Food insecurity:    Worry: Not on file    Inability: Not on file  . Transportation needs:    Medical: Not on file    Non-medical: Not on file  Tobacco Use  . Smoking status: Never Smoker  . Smokeless tobacco: Never Used  Substance and Sexual Activity  . Alcohol use: No  . Drug use: No  . Sexual activity: Not on file  Lifestyle  . Physical activity:    Days per week: Not on file    Minutes per session: Not on file  . Stress: Not on file  Relationships  . Social connections:    Talks on phone: Not on file    Gets together: Not on file    Attends religious service: Not on file    Active member of club or organization: Not on file    Attends meetings of clubs or organizations: Not on file    Relationship status: Not on file  . Intimate  partner violence:    Fear of current or ex partner: Not on file    Emotionally abused: Not on file    Physically abused: Not on file    Forced sexual activity: Not on file  Other Topics Concern  . Not on file  Social History Narrative   Moved to the [place] area 2012     Original from [place] , Married     Lives w/ wife, son and his family    2 daughters, 1 son       Family History  Problem Relation Age of Onset  . Other Mother        Unknown   . Other Father        Unknown  . Diabetes Neg Hx   . CAD Neg Hx   . Cancer Neg Hx      Allergies as of 10/01/2017   No Known Allergies     Medication List        Accurate as of 10/01/17  5:41 PM. Always use your most recent med list.          ACCU-CHEK AVIVA PLUS test strip Generic drug:  glucose blood CHECK BLOOD  SUGAR NO MORE THAN TWICE DAILY   ACCU-CHEK AVIVA Soln USE AS DIRECTED TO CALIBRATE GLUCOSE MACHINE AS NEEDED   ACCU-CHEK SOFTCLIX LANCETS lancets CHECK BLOOD SUGAR NO MORE THAN TWICE DAILY.   aspirin 81 MG EC tablet Take 1 tablet (81 mg total) by mouth daily.   atorvastatin 40 MG tablet Commonly known as:  LIPITOR Take 1 tablet (40 mg total) by mouth at bedtime.   ezetimibe 10 MG tablet Commonly known as:  ZETIA Take 1 tablet (10 mg total) by mouth daily.   metFORMIN 500 MG tablet Commonly known as:  GLUCOPHAGE Take 500 mg by mouth 2 (two) times daily with a meal.   nitroGLYCERIN 0.4 MG SL tablet Commonly known as:  NITROSTAT Place 1 tablet (0.4 mg total) under the tongue every 5 (five) minutes as needed for chest pain.   sitaGLIPtin 100 MG tablet Commonly known as:  JANUVIA Take 1 tablet (100 mg total) by mouth daily.          Objective:   Physical Exam BP 116/72 (BP Location: Left Arm, Patient Position: Sitting, Cuff Size: Small)   Pulse 61   Temp 98.2 F (36.8 C) (Oral)   Resp 14   Ht 5\' 9"  (1.753 m)   Wt 147 lb 8 oz (66.9 kg)   SpO2 96%   BMI 21.78 kg/m  General:   Well developed,  well nourished . NAD.  Neck: No  thyromegaly  HEENT:  Normocephalic . Face symmetric, atraumatic Lungs:  CTA B Normal respiratory effort, no intercostal retractions, no accessory muscle use. Heart: RRR,  no murmur.  No pretibial edema bilaterally  Abdomen:  Not distended, soft, non-tender. No rebound or rigidity.   Skin: Exposed areas without rash. Not pale. Not jaundice DIABETIC FEET EXAM: No lower extremity edema Normal pedal pulses bilaterally Skin normal, nails normal, no calluses Pinprick examination of the feet normal. Neurologic:  alert & oriented X3.  Speech normal, gait assisted by a cane, somewhat limited by DJD. Strength symmetric and appropriate for age.  Psych: Cognition and judgment appear intact.  Cooperative with normal attention span and concentration.  Behavior appropriate. No anxious or depressed appearing.     Assessment & Plan:   Assessment  DM w/ neuropathy (numbness, no pain) Dyslipidemia  DJD 11-2016: CP, admitted, stress test indeterminate risk, declined cardiac catheterization Amaurosis fugax dx 10-2015, Dr Bing Plume, ECHO 11-2015 WNL, carotid u/s 02-2016 mild to moderate amount of atherosclerosis Osteopenia : DEXA 2015, Rx calcium and vitamin D ED Elevated PSA-prostate nodule:saw urology multiple times, last ~ 2013, declined a bx, declined further eval, aware of risks HOH  PLAN:  DM: Currently on metformin, Januvia, check a A1c Neuropathy: Despite the diagnosis, foot exam today is normal High cholesterol: On Lipitor, Zetia added by cardiology 02-2017, checking labs DJD: Most pain is on the right knee, request further eval, refer to sports medicine  Chest pain: Saw cardiology on f/u. No further eval recommended. Compliance: Has a hard time remember medication names, recommend to review medication list carefully and call if there is any discrepancies. RTC 6 months

## 2017-10-04 ENCOUNTER — Telehealth: Payer: Self-pay | Admitting: Internal Medicine

## 2017-10-04 NOTE — Telephone Encounter (Signed)
TSH elevated, currently litearture suggest start treatment when TSH is more than 10.  Cholesterol has no changed with the addition of Zetia, compliance?.  Advised patient: His thyroid is under working, recommend Synthroid 50 mcg 1 tablet daily #30 and 2 refill  Diabetes is satisfactory Cholesterol has not improved with the addition of Zetia, watch diet carefully, take medications every day as prescribed.   Schedule a office visit in 2 months for a follow-up

## 2017-10-05 MED ORDER — LEVOTHYROXINE SODIUM 50 MCG PO TABS: 50.0000 ug | ORAL_TABLET | Freq: Every day | ORAL | 0 refills | Status: DC

## 2017-10-05 NOTE — Telephone Encounter (Signed)
Synthroid 41mcg 1 tab daily sent to [facility].

## 2017-10-05 NOTE — Telephone Encounter (Addendum)
Pt given results per notes of Dr. Larose Kells on 10/04/17. Unable to document in result note due to result note not being routed to [facility].   Pt needs Synthroid called in to [facility] and pt requesting 90 day prescription.  2 month follow up appt made

## 2017-10-05 NOTE — Telephone Encounter (Signed)
LMOM informing Pt to return call regarding lab results. Okay for PEC to discuss.

## 2017-10-06 ENCOUNTER — Telehealth: Payer: Self-pay | Admitting: Internal Medicine

## 2017-10-06 DIAGNOSIS — R69 Illness, unspecified: Secondary | ICD-10-CM | POA: Diagnosis not present

## 2017-10-06 NOTE — Telephone Encounter (Addendum)
error 

## 2017-10-12 ENCOUNTER — Ambulatory Visit: Payer: Medicare HMO | Admitting: Family Medicine

## 2017-10-13 ENCOUNTER — Encounter: Payer: Self-pay | Admitting: Family Medicine

## 2017-10-13 ENCOUNTER — Ambulatory Visit (INDEPENDENT_AMBULATORY_CARE_PROVIDER_SITE_OTHER): Payer: Medicare HMO

## 2017-10-13 ENCOUNTER — Ambulatory Visit (INDEPENDENT_AMBULATORY_CARE_PROVIDER_SITE_OTHER): Payer: Medicare HMO | Admitting: Family Medicine

## 2017-10-13 VITALS — BP 118/74 | HR 71 | Temp 98.1°F | Ht 69.0 in | Wt 148.0 lb

## 2017-10-13 DIAGNOSIS — M25561 Pain in right knee: Secondary | ICD-10-CM

## 2017-10-13 DIAGNOSIS — G8929 Other chronic pain: Secondary | ICD-10-CM

## 2017-10-13 DIAGNOSIS — M1711 Unilateral primary osteoarthritis, right knee: Secondary | ICD-10-CM | POA: Diagnosis not present

## 2017-10-13 DIAGNOSIS — R6889 Other general symptoms and signs: Secondary | ICD-10-CM | POA: Diagnosis not present

## 2017-10-13 NOTE — Addendum Note (Signed)
Addended by: Diona Foley on: 10/13/2017 11:27 AM   Modules accepted: Orders

## 2017-10-13 NOTE — Assessment & Plan Note (Signed)
Pain likely related to arthritic changes  - aspiration and injection  - samples of pennsaid  - ace wrap provided  - xrays  - if no improvement consider gel injections.

## 2017-10-13 NOTE — Progress Notes (Signed)
Marco [name] - 84 y.o. male MRN [id number]  Date of birth: [birth year]  SUBJECTIVE:  Including CC & ROS.  Chief Complaint  Patient presents with  . Knee Pain    Marco [name] is a 84 y.o. male that is presenting with bilateral knee pain.Pain has been ongoing for one year. Right is worse than left. Pain is located on the medial aspect radiates down his calves. Walking or standing exacerbate the pain. Denies injury or previous surgeries. He uses a cane to walk daily. He has not been taking anything for the pain. Does not take anything for pain. Had received an injection before about a year ago.      Review of Systems  Constitutional: Negative for fever.  HENT: Negative for congestion.   Respiratory: Negative for cough.   Cardiovascular: Negative for chest pain.  Gastrointestinal: Negative for abdominal pain.  Musculoskeletal: Positive for gait problem and joint swelling.  Skin: Negative for color change.  Neurological: Negative for weakness.  Hematological: Negative for adenopathy.  Psychiatric/Behavioral: Negative for agitation.    HISTORY: Past Medical, Surgical, Social, and Family History Reviewed & Updated per EMR.   Pertinent Historical Findings include:  Past Medical History:  Diagnosis Date  . Amaurosis fugax 2017  . Diabetes mellitus   . DJD (degenerative joint disease) 08/25/2013  . Elevated PSA   . Other and unspecified hyperlipidemia 04/25/2013    Past Surgical History:  Procedure Laterality Date  . CATARACT EXTRACTION     Bilaterally  . TYMPANIC MEMBRANE REPAIR     R    No Known Allergies  Family History  Problem Relation Age of Onset  . Other Mother        Unknown   . Other Father        Unknown  . Diabetes Neg Hx   . CAD Neg Hx   . Cancer Neg Hx      Social History   Socioeconomic History  . Marital status: Married    Spouse name: Not on file  . Number of children: 3  . Years of education: Not on file  . Highest education level: Not  on file  Occupational History  . Occupation: Retired  Scientific laboratory technician  . Financial resource strain: Not on file  . Food insecurity:    Worry: Not on file    Inability: Not on file  . Transportation needs:    Medical: Not on file    Non-medical: Not on file  Tobacco Use  . Smoking status: Never Smoker  . Smokeless tobacco: Never Used  Substance and Sexual Activity  . Alcohol use: No  . Drug use: No  . Sexual activity: Not on file  Lifestyle  . Physical activity:    Days per week: Not on file    Minutes per session: Not on file  . Stress: Not on file  Relationships  . Social connections:    Talks on phone: Not on file    Gets together: Not on file    Attends religious service: Not on file    Active member of club or organization: Not on file    Attends meetings of clubs or organizations: Not on file    Relationship status: Not on file  . Intimate partner violence:    Fear of current or ex partner: Not on file    Emotionally abused: Not on file    Physically abused: Not on file    Forced sexual activity: Not on file  Other Topics Concern  . Not on file  Social History Narrative   Moved to the [place] area 2012     Original from [place] , Married     Lives w/ wife, son and his family    2 daughters, 1 son       PHYSICAL EXAM:  VS: BP 118/74 (BP Location: Left Arm, Patient Position: Sitting, Cuff Size: Normal)   Pulse 71   Temp 98.1 F (36.7 C) (Oral)   Ht 5\' 9"  (1.753 m)   Wt 148 lb (67.1 kg)   SpO2 98%   BMI 21.86 kg/m  Physical Exam Gen: NAD, alert, cooperative with exam, well-appearing ENT: normal lips, normal nasal mucosa,  Eye: normal EOM, normal conjunctiva and lids CV:  no edema, +2 pedal pulses   Resp: no accessory muscle use, non-labored,  Skin: no rashes, no areas of induration  Neuro: normal tone, normal sensation to touch Psych:  normal insight, alert and oriented MSK:  Right and left Knee: Normal to inspection with no erythema  Mild effusion    Palpation normal with no warmth, joint line tenderness, patellar tenderness, or condyle tenderness. ROM full in flexion and extension and lower leg rotation. Ligaments with solid consistent endpoints including LCL, MCL. Negative Mcmurray's tests. Non painful patellar compression. Patellar glide without crepitus. Patellar and quadriceps tendons unremarkable. Hamstring and quadriceps strength is normal.  Neurovascularly intact   Limited ultrasound: Right and left knee:  Right knee:  Severe effusion  Medial joint space with significant degenerative change   Left knee:  Severe effusion  Medial joint space with significant degenerative change   Summary: significant arthritic change   Ultrasound and interpretation by Clearance Coots, MD     Aspiration/Injection Procedure Note [id number] 1934-01-14  Procedure: Aspiration and Injection Indications: right knee pain   Procedure Details Consent: Risks of procedure as well as the alternatives and risks of each were explained to the (patient/caregiver).  Consent for procedure obtained. Time Out: Verified patient identification, verified procedure, site/side was marked, verified correct patient position, special equipment/implants available, medications/allergies/relevent history reviewed, required imaging and test results available.  Performed.  The area was cleaned with iodine and alcohol swabs.    The right superiorlateral SPP was injected with 5 cc of 1% lidocaine to anesthetize the tract in the skin.  An 18-gauge needle was used and inserted into the suprapatellar pouch.  Aspiration was achieved under ultrasound guidance.  The syringe was switched off and a mixture of 1 cc of 40 mg Depo-Medrol and 4 cc of 1% lidocaine without epinephrine was injected into the suprapatellar pouch under ultrasound guidance.   Amount of Fluid Aspirated: 42mL Character of Fluid: clear and straw colored Fluid was sent for:n/a A sterile dressing was  applied.  Patient did tolerate procedure well.      ASSESSMENT & PLAN:   Chronic pain of right knee Pain likely related to arthritic changes  - aspiration and injection  - samples of pennsaid  - ace wrap provided  - xrays  - if no improvement consider gel injections.

## 2017-10-13 NOTE — Patient Instructions (Signed)
Nice to meet you  Please try to use tylenol for your pain  Please try to ice the knee   Please try to rub the medication on your knee.   Take tylenol 650 mg three times a day is the best evidence based medicine we have for arthritis.   Glucosamine sulfate 750mg  twice a day is a supplement that has been shown to help moderate to severe arthritis.  Vitamin D 2000 IU daily  Fish oil 2 grams daily.   Tumeric 500mg  twice daily.   Capsaicin topically up to four times a day may also help with pain.  Cortisone injections are an option if these interventions do not seem to make a difference or need more relief.   If cortisone injections do not help, there are different types of shots that may help but they take longer to take effect.  We can discuss this at follow up.   It's important that you continue to stay active.

## 2017-10-14 ENCOUNTER — Telehealth: Payer: Self-pay | Admitting: Internal Medicine

## 2017-10-14 NOTE — Telephone Encounter (Signed)
Spoke w/ Pt- he is requesting a prescription for Pennsaid to be sent to [facility] mail order- informed I'd send a message to Dr. Raeford Razor. Pt verbalized understanding.

## 2017-10-14 NOTE — Telephone Encounter (Signed)
Copied from [place] [id number]. Topic: General - Other >> Oct 14, 2017 12:07 PM Cecelia Byars, NT wrote: Reason for CRM: Patient called and said he would like a return call from Dr Ethel Rana nurse [place] Cerise to call him back at [phone]  ,he asked for Dr Ethel Rana nurse specifically

## 2017-10-16 ENCOUNTER — Telehealth: Payer: Self-pay | Admitting: Family Medicine

## 2017-10-16 NOTE — Telephone Encounter (Signed)
Spoke with patient about his xray. Will provided samples of Pennsaid if he needs more.   Rosemarie Ax, MD [facility] 10/16/2017, 4:35 PM

## 2017-10-20 ENCOUNTER — Telehealth: Payer: Self-pay | Admitting: *Deleted

## 2017-10-20 MED ORDER — DICLOFENAC SODIUM 1 % TD GEL: 2.0000 g | Freq: Four times a day (QID) | TRANSDERMAL | 3 refills | Status: DC

## 2017-10-20 NOTE — Telephone Encounter (Signed)
Pt would like a generic of Pennsaid called into his [place], [place] [phone] (Phone) [phone] (Fax)            Requesting a 90 day supply. He states he will not be able to pick up the samples.

## 2017-10-20 NOTE — Telephone Encounter (Signed)
Left patient a message on his voice mail  regarding Pennsaid samples, will be left up front for him.     Copied from [place] [phone]. Topic: Inquiry >> Oct 19, 2017 11:29 AM Conception Chancy, NT wrote: Reason for CRM: patient is calling and states he would like to speak with Clearance Coots or his nurse. Please contact.

## 2017-10-20 NOTE — Telephone Encounter (Signed)
Will send to Dr. Raeford Razor.

## 2017-10-20 NOTE — Addendum Note (Signed)
Addended by: Rosemarie Ax on: 10/20/2017 04:51 PM   Modules accepted: Orders

## 2017-10-26 ENCOUNTER — Telehealth: Payer: Self-pay | Admitting: Internal Medicine

## 2017-10-26 DIAGNOSIS — R6889 Other general symptoms and signs: Secondary | ICD-10-CM | POA: Diagnosis not present

## 2017-10-26 NOTE — Telephone Encounter (Signed)
Copied from [place] [id number]. Topic: Quick Communication - Rx Refill/Question >> Oct 26, 2017 12:42 PM Marco [name] wrote: Medication: diclofenac sodium (VOLTAREN) 1 % GEL  Has the patient contacted their pharmacy? Yes.  Pharmacy calling  They faxed a request over to the [facility] office today and the 17th   (Agent: If no, request that the patient contact the pharmacy for the refill.) (Agent: If yes, when and what did the pharmacy advise?)  Preferred Pharmacy (with phone number or street name): [place], [place] [phone] (Phone) [phone] (Fax)      Agent: Please be advised that RX refills may take up to 3 business days. We ask that you follow-up with your pharmacy.

## 2017-10-26 NOTE — Telephone Encounter (Signed)
Returned patients call regarding Voltaren jell  Spoke to [place] at [facility] drug store  [facility]  At [address] and [address]   Rx was picked up on 10/21/2017  And pt pt has 3 refills She stated he only had to pay 1.25 for med. Called to clarify what patient wants to do left VM

## 2017-10-27 MED ORDER — DICLOFENAC SODIUM 1 % TD GEL: 2.0000 g | Freq: Four times a day (QID) | TRANSDERMAL | 0 refills | Status: DC

## 2017-10-27 NOTE — Telephone Encounter (Signed)
Rx sent 

## 2017-10-27 NOTE — Telephone Encounter (Signed)
Okay to refill Voltaren gel 3 tubes to [facility] order

## 2017-10-27 NOTE — Telephone Encounter (Signed)
Pt said that he picked up 1 tube at [facility] but wants refills in 90 day supply from [facility]. He will have $0 copay for 90 day supply (3 tubes). Please advise.

## 2017-10-27 NOTE — Telephone Encounter (Signed)
Please advise- this was prescribed by Dr. Raeford Razor.

## 2017-10-27 NOTE — Addendum Note (Signed)
Addended byDamita Dunnings D on: 10/27/2017 04:17 PM   Modules accepted: Orders

## 2017-11-25 ENCOUNTER — Encounter: Payer: Self-pay | Admitting: Internal Medicine

## 2017-11-25 DIAGNOSIS — R6889 Other general symptoms and signs: Secondary | ICD-10-CM | POA: Diagnosis not present

## 2017-12-02 ENCOUNTER — Encounter: Payer: Self-pay | Admitting: Internal Medicine

## 2017-12-02 ENCOUNTER — Ambulatory Visit (INDEPENDENT_AMBULATORY_CARE_PROVIDER_SITE_OTHER): Payer: Medicare HMO | Admitting: Internal Medicine

## 2017-12-02 VITALS — BP 122/78 | HR 59 | Temp 97.9°F | Resp 16 | Ht 69.0 in | Wt 148.1 lb

## 2017-12-02 DIAGNOSIS — E039 Hypothyroidism, unspecified: Secondary | ICD-10-CM | POA: Diagnosis not present

## 2017-12-02 LAB — TSH: TSH: 2.16 u[IU]/mL (ref 0.35–4.50)

## 2017-12-02 MED ORDER — ATORVASTATIN CALCIUM 40 MG PO TABS: 40.0000 mg | ORAL_TABLET | Freq: Every day | ORAL | 1 refills | Status: DC

## 2017-12-02 MED ORDER — SITAGLIPTIN PHOSPHATE 100 MG PO TABS: 100.0000 mg | ORAL_TABLET | Freq: Every day | ORAL | 1 refills | Status: DC

## 2017-12-02 NOTE — Progress Notes (Signed)
Subjective:    Patient ID: Marco [name], male    DOB: 03-17-1934, 84 y.o.   MRN: [id number]  DOS:  12/02/2017 Type of visit - description : rov Interval history: Hypothyroidism: Last TSH elevated, on Synthroid, reports good compliance and no apparent side effects  Wt Readings from Last 3 Encounters:  12/02/17 148 lb 2 oz (67.2 kg)  10/13/17 148 lb (67.1 kg)  10/01/17 147 lb 8 oz (66.9 kg)     Review of Systems Denies chest pain, palpitations No nausea, vomiting, diarrhea.  Past Medical History:  Diagnosis Date  . Amaurosis fugax 2017  . Diabetes mellitus   . DJD (degenerative joint disease) 08/25/2013  . Elevated PSA   . Other and unspecified hyperlipidemia 04/25/2013    Past Surgical History:  Procedure Laterality Date  . CATARACT EXTRACTION     Bilaterally  . TYMPANIC MEMBRANE REPAIR     R    Social History   Socioeconomic History  . Marital status: Married    Spouse name: Not on file  . Number of children: 3  . Years of education: Not on file  . Highest education level: Not on file  Occupational History  . Occupation: Retired  Scientific laboratory technician  . Financial resource strain: Not on file  . Food insecurity:    Worry: Not on file    Inability: Not on file  . Transportation needs:    Medical: Not on file    Non-medical: Not on file  Tobacco Use  . Smoking status: Never Smoker  . Smokeless tobacco: Never Used  Substance and Sexual Activity  . Alcohol use: No  . Drug use: No  . Sexual activity: Not on file  Lifestyle  . Physical activity:    Days per week: Not on file    Minutes per session: Not on file  . Stress: Not on file  Relationships  . Social connections:    Talks on phone: Not on file    Gets together: Not on file    Attends religious service: Not on file    Active member of club or organization: Not on file    Attends meetings of clubs or organizations: Not on file    Relationship status: Not on file  . Intimate partner violence:   Fear of current or ex partner: Not on file    Emotionally abused: Not on file    Physically abused: Not on file    Forced sexual activity: Not on file  Other Topics Concern  . Not on file  Social History Narrative   Moved to the [place] area 2012     Original from [place] , Married     Lives w/ wife, son and his family    2 daughters, 1 son        Allergies as of 12/02/2017   No Known Allergies     Medication List        Accurate as of 12/02/17  9:33 PM. Always use your most recent med list.          ACCU-CHEK AVIVA PLUS test strip Generic drug:  glucose blood CHECK BLOOD SUGAR NO MORE THAN TWICE DAILY   ACCU-CHEK AVIVA Soln USE AS DIRECTED TO CALIBRATE GLUCOSE MACHINE AS NEEDED   ACCU-CHEK SOFTCLIX LANCETS lancets CHECK BLOOD SUGAR NO MORE THAN TWICE DAILY.   aspirin 81 MG EC tablet Take 1 tablet (81 mg total) by mouth daily.   atorvastatin 40 MG tablet Commonly known as:  LIPITOR Take 1 tablet (40 mg total) by mouth at bedtime.   diclofenac sodium 1 % Gel Commonly known as:  VOLTAREN Apply 2 g topically 4 (four) times daily.   ezetimibe 10 MG tablet Commonly known as:  ZETIA Take 1 tablet (10 mg total) by mouth daily.   levothyroxine 50 MCG tablet Commonly known as:  SYNTHROID, LEVOTHROID Take 1 tablet (50 mcg total) by mouth daily before breakfast.   metFORMIN 500 MG tablet Commonly known as:  GLUCOPHAGE Take 500 mg by mouth 2 (two) times daily with a meal.   nitroGLYCERIN 0.4 MG SL tablet Commonly known as:  NITROSTAT Place 1 tablet (0.4 mg total) under the tongue every 5 (five) minutes as needed for chest pain.   sitaGLIPtin 100 MG tablet Commonly known as:  JANUVIA Take 1 tablet (100 mg total) by mouth daily.          Objective:   Physical Exam BP 122/78 (BP Location: Left Arm, Patient Position: Sitting, Cuff Size: Small)   Pulse (!) 59   Temp 97.9 F (36.6 C) (Oral)   Resp 16   Ht 5\' 9"  (1.753 m)   Wt 148 lb 2 oz (67.2 kg)   SpO2 98%    BMI 21.87 kg/m  General:   Well developed, NAD, see BMI.  HEENT:  Normocephalic . Face symmetric, atraumatic Lungs:  CTA B Normal respiratory effort, no intercostal retractions, no accessory muscle use. Heart: RRR,  no murmur.  No pretibial edema bilaterally  Skin: Not pale. Not jaundice Neurologic:  alert & oriented X3.  Speech normal, gait appropriate for age and unassisted Psych--  Cognition and judgment appear intact.  Cooperative with normal attention span and concentration.  Behavior appropriate. No anxious or depressed appearing.      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  Januvia RF sent High cholesterol: On Lipitor, Zetia added 02-2017, no major change on his lipid profile, I wonder about compliance.  Hypothyroidism: Last TSH 11.3 on 09/2017, started Synthroid per guidelines.  Reports good compliance, rechecking labs RTC 04/2018 as a schedule

## 2017-12-02 NOTE — Patient Instructions (Addendum)
GO TO THE LAB : Get the blood work     Your next appointment is in December

## 2017-12-02 NOTE — Progress Notes (Signed)
Pre visit review using our clinic review tool, if applicable. No additional management support is needed unless otherwise documented below in the visit note. 

## 2017-12-02 NOTE — Assessment & Plan Note (Signed)
DM: Last A1c 7.3, on metformin and Januvia, no change.  Januvia RF sent High cholesterol: On Lipitor, Zetia added 02-2017, no major change on his lipid profile, I wonder about compliance.  Hypothyroidism: Last TSH 11.3 on 09/2017, started Synthroid per guidelines.  Reports good compliance, rechecking labs RTC 04/2018 as a schedule

## 2017-12-04 ENCOUNTER — Ambulatory Visit: Payer: Medicare HMO | Admitting: Internal Medicine

## 2017-12-04 MED ORDER — LEVOTHYROXINE SODIUM 50 MCG PO TABS: 50.0000 ug | ORAL_TABLET | Freq: Every day | ORAL | 1 refills | Status: DC

## 2017-12-04 NOTE — Addendum Note (Signed)
Addended byDamita Dunnings D on: 12/04/2017 04:21 PM   Modules accepted: Orders

## 2017-12-09 DIAGNOSIS — R6889 Other general symptoms and signs: Secondary | ICD-10-CM | POA: Diagnosis not present

## 2017-12-30 DIAGNOSIS — Z7984 Long term (current) use of oral hypoglycemic drugs: Secondary | ICD-10-CM | POA: Diagnosis not present

## 2017-12-30 DIAGNOSIS — H524 Presbyopia: Secondary | ICD-10-CM | POA: Diagnosis not present

## 2017-12-30 DIAGNOSIS — H5213 Myopia, bilateral: Secondary | ICD-10-CM | POA: Diagnosis not present

## 2017-12-30 DIAGNOSIS — Z961 Presence of intraocular lens: Secondary | ICD-10-CM | POA: Diagnosis not present

## 2017-12-30 DIAGNOSIS — H52203 Unspecified astigmatism, bilateral: Secondary | ICD-10-CM | POA: Diagnosis not present

## 2017-12-30 DIAGNOSIS — R6889 Other general symptoms and signs: Secondary | ICD-10-CM | POA: Diagnosis not present

## 2017-12-30 DIAGNOSIS — H47393 Other disorders of optic disc, bilateral: Secondary | ICD-10-CM | POA: Diagnosis not present

## 2017-12-30 DIAGNOSIS — H40003 Preglaucoma, unspecified, bilateral: Secondary | ICD-10-CM | POA: Diagnosis not present

## 2017-12-30 DIAGNOSIS — E119 Type 2 diabetes mellitus without complications: Secondary | ICD-10-CM | POA: Diagnosis not present

## 2017-12-30 LAB — HM DIABETES EYE EXAM

## 2018-01-08 DIAGNOSIS — R6889 Other general symptoms and signs: Secondary | ICD-10-CM | POA: Diagnosis not present

## 2018-02-05 DIAGNOSIS — R6889 Other general symptoms and signs: Secondary | ICD-10-CM | POA: Diagnosis not present

## 2018-02-10 DIAGNOSIS — Z0100 Encounter for examination of eyes and vision without abnormal findings: Secondary | ICD-10-CM | POA: Diagnosis not present

## 2018-03-11 ENCOUNTER — Other Ambulatory Visit: Payer: Self-pay | Admitting: Internal Medicine

## 2018-03-11 ENCOUNTER — Other Ambulatory Visit: Payer: Self-pay

## 2018-03-11 MED ORDER — DICLOFENAC SODIUM 1 % TD GEL: 2.0000 g | Freq: Four times a day (QID) | TRANSDERMAL | 0 refills | Status: DC

## 2018-03-15 ENCOUNTER — Other Ambulatory Visit: Payer: Self-pay | Admitting: Internal Medicine

## 2018-03-15 MED ORDER — EZETIMIBE 10 MG PO TABS: 10.0000 mg | ORAL_TABLET | Freq: Every day | ORAL | 0 refills | Status: DC

## 2018-03-18 ENCOUNTER — Encounter: Payer: Self-pay | Admitting: Internal Medicine

## 2018-04-05 ENCOUNTER — Ambulatory Visit (INDEPENDENT_AMBULATORY_CARE_PROVIDER_SITE_OTHER): Payer: Medicare HMO | Admitting: Internal Medicine

## 2018-04-05 ENCOUNTER — Encounter: Payer: Self-pay | Admitting: Internal Medicine

## 2018-04-05 VITALS — BP 116/64 | HR 58 | Temp 98.0°F | Resp 16 | Ht 69.0 in | Wt 148.2 lb

## 2018-04-05 DIAGNOSIS — E1169 Type 2 diabetes mellitus with other specified complication: Secondary | ICD-10-CM | POA: Diagnosis not present

## 2018-04-05 DIAGNOSIS — E039 Hypothyroidism, unspecified: Secondary | ICD-10-CM

## 2018-04-05 LAB — CBC WITH DIFFERENTIAL/PLATELET
BASOS ABS: 0.1 10*3/uL (ref 0.0–0.1)
Basophils Relative: 1.3 % (ref 0.0–3.0)
Eosinophils Absolute: 0.6 10*3/uL (ref 0.0–0.7)
Eosinophils Relative: 13.2 % — ABNORMAL HIGH (ref 0.0–5.0)
HCT: 40.7 % (ref 39.0–52.0)
Hemoglobin: 13.1 g/dL (ref 13.0–17.0)
LYMPHS ABS: 1.4 10*3/uL (ref 0.7–4.0)
Lymphocytes Relative: 32.1 % (ref 12.0–46.0)
MCHC: 32.2 g/dL (ref 30.0–36.0)
MCV: 83.4 fl (ref 78.0–100.0)
MONOS PCT: 10 % (ref 3.0–12.0)
Monocytes Absolute: 0.4 10*3/uL (ref 0.1–1.0)
NEUTROS ABS: 1.9 10*3/uL (ref 1.4–7.7)
NEUTROS PCT: 43.4 % (ref 43.0–77.0)
PLATELETS: 174 10*3/uL (ref 150.0–400.0)
RBC: 4.88 Mil/uL (ref 4.22–5.81)
RDW: 14.7 % (ref 11.5–15.5)
WBC: 4.3 10*3/uL (ref 4.0–10.5)

## 2018-04-05 LAB — MICROALBUMIN / CREATININE URINE RATIO
CREATININE, U: 205.9 mg/dL
MICROALB UR: 2.8 mg/dL — AB (ref 0.0–1.9)
Microalb Creat Ratio: 1.4 mg/g (ref 0.0–30.0)

## 2018-04-05 LAB — HEMOGLOBIN A1C: HEMOGLOBIN A1C: 7.9 % — AB (ref 4.6–6.5)

## 2018-04-05 LAB — TSH: TSH: 3.76 u[IU]/mL (ref 0.35–4.50)

## 2018-04-05 NOTE — Assessment & Plan Note (Signed)
Med compliance: Apparently his compliance is poor DM: Currently on metformin, Januvia.  Will check a A1c micro CBC High cholesterol: Not at goal per last FLP, reports she is taking atorvastatin most days, Zetia less frequently.  Encourage to take both daily.  Check FLP on return to the office. Hypothyroidism: Check a TSH RTC 6 months CPX

## 2018-04-05 NOTE — Progress Notes (Signed)
Pre visit review using our clinic review tool, if applicable. No additional management support is needed unless otherwise documented below in the visit note. 

## 2018-04-05 NOTE — Progress Notes (Signed)
Subjective:    Patient ID: Marco [name], male    DOB: 26-Mar-1934, 84 y.o.   MRN: [id number]  DOS:  04/05/2018 Type of visit - description : f/u No new concerns. Reports poor compliance with Zetia.   Review of Systems  Denies chest pain no difficul breathing. No nausea, vomiting, diarrhea   Past Medical History:  Diagnosis Date  . Amaurosis fugax 2017  . Diabetes mellitus   . DJD (degenerative joint disease) 08/25/2013  . Elevated PSA   . Other and unspecified hyperlipidemia 04/25/2013    Past Surgical History:  Procedure Laterality Date  . CATARACT EXTRACTION     Bilaterally  . TYMPANIC MEMBRANE REPAIR     R    Social History   Socioeconomic History  . Marital status: Married    Spouse name: Not on file  . Number of children: 3  . Years of education: Not on file  . Highest education level: Not on file  Occupational History  . Occupation: Retired  Scientific laboratory technician  . Financial resource strain: Not on file  . Food insecurity:    Worry: Not on file    Inability: Not on file  . Transportation needs:    Medical: Not on file    Non-medical: Not on file  Tobacco Use  . Smoking status: Never Smoker  . Smokeless tobacco: Never Used  Substance and Sexual Activity  . Alcohol use: No  . Drug use: No  . Sexual activity: Not on file  Lifestyle  . Physical activity:    Days per week: Not on file    Minutes per session: Not on file  . Stress: Not on file  Relationships  . Social connections:    Talks on phone: Not on file    Gets together: Not on file    Attends religious service: Not on file    Active member of club or organization: Not on file    Attends meetings of clubs or organizations: Not on file    Relationship status: Not on file  . Intimate partner violence:    Fear of current or ex partner: Not on file    Emotionally abused: Not on file    Physically abused: Not on file    Forced sexual activity: Not on file  Other Topics Concern  . Not on file    Social History Narrative   Moved to the [place] area 2012     Original from [place] , Married     Lives w/ wife, son and his family    2 daughters, 1 son        Allergies as of 04/05/2018   No Known Allergies     Medication List        Accurate as of 04/05/18  9:01 PM. Always use your most recent med list.          ACCU-CHEK AVIVA PLUS test strip Generic drug:  glucose blood CHECK BLOOD SUGAR NO MORE THAN TWICE DAILY   ACCU-CHEK AVIVA Soln USE AS DIRECTED TO CALIBRATE GLUCOSE MACHINE AS NEEDED   ACCU-CHEK SOFTCLIX LANCETS lancets CHECK BLOOD SUGAR NO MORE THAN TWICE DAILY.   aspirin 81 MG EC tablet Take 1 tablet (81 mg total) by mouth daily.   atorvastatin 40 MG tablet Commonly known as:  LIPITOR Take 1 tablet (40 mg total) by mouth at bedtime.   diclofenac sodium 1 % Gel Commonly known as:  VOLTAREN Apply 2 g topically 4 (four) times daily.  ezetimibe 10 MG tablet Commonly known as:  ZETIA Take 1 tablet (10 mg total) by mouth daily.   levothyroxine 50 MCG tablet Commonly known as:  SYNTHROID, LEVOTHROID Take 1 tablet (50 mcg total) by mouth daily before breakfast.   metFORMIN 500 MG tablet Commonly known as:  GLUCOPHAGE Take 1 tablet (500 mg total) by mouth 2 (two) times daily with a meal.   nitroGLYCERIN 0.4 MG SL tablet Commonly known as:  NITROSTAT Place 1 tablet (0.4 mg total) under the tongue every 5 (five) minutes as needed for chest pain.   sitaGLIPtin 100 MG tablet Commonly known as:  JANUVIA Take 1 tablet (100 mg total) by mouth daily.           Objective:   Physical Exam BP 116/64 (BP Location: Left Arm, Patient Position: Sitting, Cuff Size: Small)   Pulse (!) 58   Temp 98 F (36.7 C) (Oral)   Resp 16   Ht 5\' 9"  (1.753 m)   Wt 148 lb 4 oz (67.2 kg)   SpO2 98%   BMI 21.89 kg/m  General:   Well developed, NAD, BMI noted. HEENT:  Normocephalic . Face symmetric, atraumatic Lungs:  CTA B Normal respiratory effort, no intercostal  retractions, no accessory muscle use. Heart: RRR,  no murmur.  No pretibial edema bilaterally  Skin: Not pale. Not jaundice Neurologic:  alert & oriented X3.  Speech normal, gait assisted by a cane, limited by DJD.  At baseline Psych--  Cognition and judgment appear intact.  Cooperative with normal attention span and concentration.  Behavior appropriate. No anxious or depressed appearing.      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  Will check a A1c micro CBC High cholesterol: Not at goal per last FLP, reports she is taking atorvastatin most days, Zetia less frequently.  Encourage to take both daily.  Check FLP on return to the office. Hypothyroidism: Check a TSH RTC 6 months CPX

## 2018-04-05 NOTE — Patient Instructions (Addendum)
Please schedule Medicare Wellness with Glenard Haring.   GO TO THE LAB : Get the blood work     GO TO THE FRONT DESK Schedule your next appointment for a physical exam in 6 moths

## 2018-04-08 ENCOUNTER — Other Ambulatory Visit: Payer: Self-pay | Admitting: Internal Medicine

## 2018-04-08 MED ORDER — METFORMIN HCL 1000 MG PO TABS: 1000.0000 mg | ORAL_TABLET | Freq: Two times a day (BID) | ORAL | 1 refills | Status: DC

## 2018-04-08 NOTE — Addendum Note (Signed)
Addended byDamita Dunnings D on: 04/08/2018 01:23 PM   Modules accepted: Orders

## 2018-04-09 ENCOUNTER — Other Ambulatory Visit: Payer: Self-pay | Admitting: Internal Medicine

## 2018-04-12 ENCOUNTER — Other Ambulatory Visit: Payer: Self-pay | Admitting: Internal Medicine

## 2018-04-12 NOTE — Telephone Encounter (Signed)
Copied from [place] [id number]. Topic: Quick Communication - Rx Refill/Question >> Apr 12, 2018  1:29 PM [name] wrote: Medication: ACCU-CHEK SOFTCLIX LANCETS lancets, ACCU-CHEK AVIVA PLUS test strip, Blood Glucose Calibration (ACCU-CHEK AVIVA) SOLN, sitaGLIPtin (JANUVIA) 100 MG tablet   Per patient [place] request new Rx said others not received.  Has the patient contacted their pharmacy? Yes.   (Agent: If no, request that the patient contact the pharmacy for the refill.) (Agent: If yes, when and what did the pharmacy advise?)  Preferred Pharmacy (with phone number or street name): [place], [place] [phone] (Phone) [phone] (Fax)    Agent: Please be advised that RX refills may take up to 3 business days. We ask that you follow-up with your pharmacy.

## 2018-04-13 MED ORDER — SITAGLIPTIN PHOSPHATE 100 MG PO TABS: 100.0000 mg | ORAL_TABLET | Freq: Every day | ORAL | 1 refills | Status: DC

## 2018-04-13 NOTE — Telephone Encounter (Signed)
[facility] called, it was verified that Accucheck Lancets, test strips and Accucheck solution was received and processing to go out to the patient. The only Rx needed is Januvia.

## 2018-05-12 ENCOUNTER — Encounter: Payer: Self-pay | Admitting: Internal Medicine

## 2018-05-17 ENCOUNTER — Other Ambulatory Visit: Payer: Self-pay | Admitting: Cardiovascular Disease

## 2018-06-01 ENCOUNTER — Other Ambulatory Visit: Payer: Self-pay | Admitting: Internal Medicine

## 2018-08-02 ENCOUNTER — Ambulatory Visit: Payer: Medicare HMO | Admitting: *Deleted

## 2018-10-11 ENCOUNTER — Other Ambulatory Visit: Payer: Self-pay | Admitting: Internal Medicine

## 2018-10-15 ENCOUNTER — Other Ambulatory Visit: Payer: Self-pay

## 2018-10-15 ENCOUNTER — Encounter: Payer: Self-pay | Admitting: Internal Medicine

## 2018-10-15 ENCOUNTER — Ambulatory Visit (INDEPENDENT_AMBULATORY_CARE_PROVIDER_SITE_OTHER): Payer: Medicare HMO | Admitting: Internal Medicine

## 2018-10-15 VITALS — BP 131/74 | HR 74 | Temp 98.0°F | Resp 16 | Ht 69.0 in | Wt 148.0 lb

## 2018-10-15 DIAGNOSIS — Z0000 Encounter for general adult medical examination without abnormal findings: Secondary | ICD-10-CM

## 2018-10-15 DIAGNOSIS — E785 Hyperlipidemia, unspecified: Secondary | ICD-10-CM | POA: Diagnosis not present

## 2018-10-15 DIAGNOSIS — E039 Hypothyroidism, unspecified: Secondary | ICD-10-CM

## 2018-10-15 DIAGNOSIS — E1169 Type 2 diabetes mellitus with other specified complication: Secondary | ICD-10-CM | POA: Diagnosis not present

## 2018-10-15 LAB — COMPREHENSIVE METABOLIC PANEL
ALT: 14 U/L (ref 0–53)
AST: 24 U/L (ref 0–37)
Albumin: 4.2 g/dL (ref 3.5–5.2)
Alkaline Phosphatase: 67 U/L (ref 39–117)
BUN: 16 mg/dL (ref 6–23)
CO2: 28 mEq/L (ref 19–32)
Calcium: 10.1 mg/dL (ref 8.4–10.5)
Chloride: 102 mEq/L (ref 96–112)
Creatinine, Ser: 1.32 mg/dL (ref 0.40–1.50)
GFR: 51.75 mL/min — ABNORMAL LOW (ref >60.00)
Glucose, Bld: 115 mg/dL — ABNORMAL HIGH (ref 70–99)
Potassium: 5.1 mEq/L (ref 3.5–5.1)
Sodium: 139 mEq/L (ref 135–145)
Total Bilirubin: 0.6 mg/dL (ref 0.2–1.2)
Total Protein: 7.1 g/dL (ref 6.0–8.3)

## 2018-10-15 LAB — LIPID PANEL
Cholesterol: 114 mg/dL (ref 0–200)
HDL: 47.2 mg/dL (ref >39.00)
LDL Cholesterol: 49 mg/dL (ref 0–99)
NonHDL: 67.13
Total CHOL/HDL Ratio: 2
Triglycerides: 90 mg/dL (ref 0.0–149.0)
VLDL: 18 mg/dL (ref 0.0–40.0)

## 2018-10-15 LAB — TSH: TSH: 7.74 u[IU]/mL — ABNORMAL HIGH (ref 0.35–4.50)

## 2018-10-15 LAB — MICROALBUMIN / CREATININE URINE RATIO
Creatinine,U: 290.2 mg/dL
Microalb Creat Ratio: 0.9 mg/g (ref 0.0–30.0)
Microalb, Ur: 2.7 mg/dL — ABNORMAL HIGH (ref 0.0–1.9)

## 2018-10-15 LAB — HEMOGLOBIN A1C: Hgb A1c MFr Bld: 7.4 % — ABNORMAL HIGH (ref 4.6–6.5)

## 2018-10-15 NOTE — Patient Instructions (Signed)
GGet the blood work     [place] Schedule your next appointment       Diabetes Mellitus and [place] care is an important part of your health, especially when you have diabetes. Diabetes may cause you to have problems because of poor blood flow (circulation) to your feet and legs, which can cause your skin to:  Become thinner and drier.  Break more easily.  Heal more slowly.  Peel and crack. You may also have nerve damage (neuropathy) in your legs and feet, causing decreased feeling in them. This means that you may not notice minor injuries to your feet that could lead to more serious problems. Noticing and addressing any potential problems early is the best way to prevent future foot problems. How to care for your feet Foot hygiene  Wash your feet daily with warm water and mild soap. Do not use hot water. Then, pat your feet and the areas between your toes until they are completely dry. Do not soak your feet as this can dry your skin.  Trim your toenails straight across. Do not dig under them or around the cuticle. File the edges of your nails with an emery board or nail file.  Apply a moisturizing lotion or petroleum jelly to the skin on your feet and to dry, brittle toenails. Use lotion that does not contain alcohol and is unscented. Do not apply lotion between your toes. Shoes and socks  Wear clean socks or stockings every day. Make sure they are not too tight. Do not wear knee-high stockings since they may decrease blood flow to your legs.  Wear shoes that fit properly and have enough cushioning. Always look in your shoes before you put them on to be sure there are no objects inside.  To break in new shoes, wear them for just a few hours a day. This prevents injuries on your feet. Wounds, scrapes, corns, and calluses  Check your feet daily for blisters, cuts, bruises, sores, and redness. If you cannot see the bottom of your feet, use a mirror or ask someone  for help.  Do not cut corns or calluses or try to remove them with medicine.  If you find a minor scrape, cut, or break in the skin on your feet, keep it and the skin around it clean and dry. You may clean these areas with mild soap and water. Do not clean the area with peroxide, alcohol, or iodine.  If you have a wound, scrape, corn, or callus on your foot, look at it several times a day to make sure it is healing and not infected. Check for: ? Redness, swelling, or pain. ? Fluid or blood. ? Warmth. ? Pus or a bad smell. General instructions  Do not cross your legs. This may decrease blood flow to your feet.  Do not use heating pads or hot water bottles on your feet. They may burn your skin. If you have lost feeling in your feet or legs, you may not know this is happening until it is too late.  Protect your feet from hot and cold by wearing shoes, such as at the beach or on hot pavement.  Schedule a complete foot exam at least once a year (annually) or more often if you have foot problems. If you have foot problems, report any cuts, sores, or bruises to your health care provider immediately. Contact a health care provider if:  You have a medical condition that increases  your risk of infection and you have any cuts, sores, or bruises on your feet.  You have an injury that is not healing.  You have redness on your legs or feet.  You feel burning or tingling in your legs or feet.  You have pain or cramps in your legs and feet.  Your legs or feet are numb.  Your feet always feel cold.  You have pain around a toenail. Get help right away if:  You have a wound, scrape, corn, or callus on your foot and: ? You have pain, swelling, or redness that gets worse. ? You have fluid or blood coming from the wound, scrape, corn, or callus. ? Your wound, scrape, corn, or callus feels warm to the touch. ? You have pus or a bad smell coming from the wound, scrape, corn, or callus. ? You have  a fever. ? You have a red line going up your leg. Summary  Check your feet every day for cuts, sores, red spots, swelling, and blisters.  Moisturize feet and legs daily.  Wear shoes that fit properly and have enough cushioning.  If you have foot problems, report any cuts, sores, or bruises to your health care provider immediately.  Schedule a complete foot exam at least once a year (annually) or more often if you have foot problems. This information is not intended to replace advice given to you by your health care provider. Make sure you discuss any questions you have with your health care provider. Document Released: 04/18/2000 Document Revised: 06/03/2017 Document Reviewed: 05/23/2016 Elsevier Interactive Patient Education  2019 [employer].

## 2018-10-15 NOTE — Progress Notes (Signed)
Subjective:    Patient ID: Marco [name], male    DOB: 02/09/1934, 85 y.o.   MRN: [id number]  DOS:  10/15/2018 Type of visit - description: cpx States that he is doing well no concerns at all.  Review of Systems Denies any problems, specifically no fever chills.  No chest pain No LUTS   Other than above, a 14 point review of systems is negative    Past Medical History:  Diagnosis Date  . Amaurosis fugax 2017  . Diabetes mellitus   . DJD (degenerative joint disease) 08/25/2013  . Elevated PSA   . Other and unspecified hyperlipidemia 04/25/2013    Past Surgical History:  Procedure Laterality Date  . CATARACT EXTRACTION     Bilaterally  . TYMPANIC MEMBRANE REPAIR     R    Social History   Socioeconomic History  . Marital status: Married    Spouse name: Not on file  . Number of children: 3  . Years of education: Not on file  . Highest education level: Not on file  Occupational History  . Occupation: Retired  Scientific laboratory technician  . Financial resource strain: Not on file  . Food insecurity    Worry: Not on file    Inability: Not on file  . Transportation needs    Medical: Not on file    Non-medical: Not on file  Tobacco Use  . Smoking status: Never Smoker  . Smokeless tobacco: Never Used  Substance and Sexual Activity  . Alcohol use: No  . Drug use: No  . Sexual activity: Not on file  Lifestyle  . Physical activity    Days per week: Not on file    Minutes per session: Not on file  . Stress: Not on file  Relationships  . Social Herbalist on phone: Not on file    Gets together: Not on file    Attends religious service: Not on file    Active member of club or organization: Not on file    Attends meetings of clubs or organizations: Not on file    Relationship status: Not on file  . Intimate partner violence    Fear of current or ex partner: Not on file    Emotionally abused: Not on file    Physically abused: Not on file    Forced sexual  activity: Not on file  Other Topics Concern  . Not on file  Social History Narrative   Moved to the [place] area 2012     Original from [place] , Married     Lives w/ wife, son and his family    2 daughters, 1 son       Family History  Problem Relation Age of Onset  . Other Mother        Unknown   . Other Father        Unknown  . Diabetes Neg Hx   . CAD Neg Hx   . Cancer Neg Hx     Allergies as of 10/15/2018   No Known Allergies     Medication List       Accurate as of October 15, 2018 11:59 PM. If you have any questions, ask your nurse or doctor.        Accu-Chek Aviva Plus test strip Generic drug: glucose blood CHECK BLOOD SUGAR NO MORE THAN TWICE DAILY   Accu-Chek Aviva Soln USE AS DIRECTED TO CALIBRATE GLUCOSE MACHINE AS NEEDED   Accu-Chek Softclix  Lancets lancets CHECK BLOOD SUGAR NO MORE THAN TWICE DAILY.   aspirin 81 MG EC tablet Take 1 tablet (81 mg total) by mouth daily.   atorvastatin 40 MG tablet Commonly known as: LIPITOR Take 1 tablet (40 mg total) by mouth at bedtime.   diclofenac sodium 1 % Gel Commonly known as: VOLTAREN Apply 2 g topically 4 (four) times daily.   ezetimibe 10 MG tablet Commonly known as: ZETIA TAKE 1 TABLET EVERY DAY (NEED MD APPOINTMENT)   levothyroxine 50 MCG tablet Commonly known as: SYNTHROID Take 1 tablet (50 mcg total) by mouth daily before breakfast.   metFORMIN 1000 MG tablet Commonly known as: GLUCOPHAGE Take 1 tablet (1,000 mg total) by mouth 2 (two) times daily with a meal.   nitroGLYCERIN 0.4 MG SL tablet Commonly known as: NITROSTAT Place 1 tablet (0.4 mg total) under the tongue every 5 (five) minutes as needed for chest pain.   sitaGLIPtin 100 MG tablet Commonly known as: Januvia Take 1 tablet (100 mg total) by mouth daily.           Objective:   Physical Exam BP 131/74 (BP Location: Left Arm, Patient Position: Sitting, Cuff Size: Small)   Pulse 74   Temp 98 F (36.7 C) (Oral)   Resp 16   Ht 5'  9" (1.753 m)   Wt 148 lb (67.1 kg)   SpO2 98%   BMI 21.86 kg/m  General: Well developed, NAD, BMI noted Neck: No  thyromegaly  HEENT:  Normocephalic . Face symmetric, atraumatic Lungs:  CTA B Normal respiratory effort, no intercostal retractions, no accessory muscle use. Heart: RRR,  no murmur.  No pretibial edema bilaterally  Abdomen:  Not distended, soft, non-tender. No rebound or rigidity.   Skin: Exposed areas without rash. Not pale. Not jaundice Diabetic foot exam: No edema, good pedal pulses, both FEET have very little fat pad.  He does have a callus at the base of the left great toe, he has it covered w/  a pad, skin is intact, no redness, no ulcer. Neurologic:  alert & oriented X3.  Speech normal, gait appropriate for age and unassisted Strength symmetric and appropriate for age.  Psych: Cognition and judgment appear intact.  Cooperative with normal attention span and concentration.  Behavior appropriate. No anxious or depressed appearing.     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  Check A1c and micro. Neuropathy: Has numbness but pinprick examination is normal.  He has a callus, he seems to be taking very good care of his feet.  Recommend to call if he notices ever redness or any ulcer.  He verbalized understanding. Dyslipidemia: On Lipitor, check a FLP Hypothyroidism: On Synthroid, checking labs Compliance with medications:  Reports that he is taking all medications every day. RTC 6 months

## 2018-10-15 NOTE — Assessment & Plan Note (Addendum)
--  Declined all immunizations again --Reports a previous colonoscopy, no documentation, he was told it was negative. Not interested on further screening. -- elevated PSA, today he reports again his desires no further eval.  "I had seen urologist in [place] and they liked you to do a biopsy". --Diet, exercise discussed   --Labs: CMP, FLP, A1c, micro, TSH

## 2018-10-15 NOTE — Progress Notes (Signed)
Pre visit review using our clinic review tool, if applicable. No additional management support is needed unless otherwise documented below in the visit note. 

## 2018-10-17 NOTE — Assessment & Plan Note (Signed)
DM: Currently on metformin, Januvia, ambulatory CBGs when checked in the 120s.  Check A1c and micro. Neuropathy: Has numbness but pinprick examination is normal.  He has a callus, he seems to be taking very good care of his feet.  Recommend to call if he notices ever redness or any ulcer.  He verbalized understanding. Dyslipidemia: On Lipitor, check a FLP Hypothyroidism: On Synthroid, checking labs Compliance with medications: Reports that he is taking all medications every day. RTC 6 months

## 2018-10-20 MED ORDER — LEVOTHYROXINE SODIUM 75 MCG PO TABS: 75.0000 ug | ORAL_TABLET | Freq: Every day | ORAL | 2 refills | Status: DC

## 2018-10-20 NOTE — Addendum Note (Signed)
Addended byDamita Dunnings D on: 10/20/2018 08:04 AM   Modules accepted: Orders

## 2018-12-01 ENCOUNTER — Other Ambulatory Visit: Payer: Self-pay | Admitting: Internal Medicine

## 2018-12-24 ENCOUNTER — Other Ambulatory Visit: Payer: Self-pay

## 2018-12-24 ENCOUNTER — Other Ambulatory Visit (INDEPENDENT_AMBULATORY_CARE_PROVIDER_SITE_OTHER): Payer: Medicare HMO

## 2018-12-24 DIAGNOSIS — E039 Hypothyroidism, unspecified: Secondary | ICD-10-CM | POA: Diagnosis not present

## 2018-12-24 LAB — TSH: TSH: 4.29 u[IU]/mL (ref 0.35–4.50)

## 2018-12-27 MED ORDER — LEVOTHYROXINE SODIUM 75 MCG PO TABS: 75.0000 ug | ORAL_TABLET | Freq: Every day | ORAL | 1 refills | Status: DC

## 2018-12-27 NOTE — Addendum Note (Signed)
Addended byDamita Dunnings D on: 12/27/2018 07:52 AM   Modules accepted: Orders

## 2019-01-04 DIAGNOSIS — H43393 Other vitreous opacities, bilateral: Secondary | ICD-10-CM | POA: Diagnosis not present

## 2019-01-04 DIAGNOSIS — H40023 Open angle with borderline findings, high risk, bilateral: Secondary | ICD-10-CM | POA: Diagnosis not present

## 2019-01-04 DIAGNOSIS — H40053 Ocular hypertension, bilateral: Secondary | ICD-10-CM | POA: Diagnosis not present

## 2019-01-04 DIAGNOSIS — E119 Type 2 diabetes mellitus without complications: Secondary | ICD-10-CM | POA: Diagnosis not present

## 2019-01-04 DIAGNOSIS — Z7984 Long term (current) use of oral hypoglycemic drugs: Secondary | ICD-10-CM | POA: Diagnosis not present

## 2019-01-04 DIAGNOSIS — H43813 Vitreous degeneration, bilateral: Secondary | ICD-10-CM | POA: Diagnosis not present

## 2019-01-04 DIAGNOSIS — H5213 Myopia, bilateral: Secondary | ICD-10-CM | POA: Diagnosis not present

## 2019-01-04 DIAGNOSIS — H524 Presbyopia: Secondary | ICD-10-CM | POA: Diagnosis not present

## 2019-01-04 DIAGNOSIS — H52203 Unspecified astigmatism, bilateral: Secondary | ICD-10-CM | POA: Diagnosis not present

## 2019-01-04 LAB — HM DIABETES EYE EXAM

## 2019-01-25 DIAGNOSIS — Z0100 Encounter for examination of eyes and vision without abnormal findings: Secondary | ICD-10-CM | POA: Diagnosis not present

## 2019-02-04 ENCOUNTER — Encounter: Payer: Self-pay | Admitting: Internal Medicine

## 2019-03-09 ENCOUNTER — Other Ambulatory Visit: Payer: Self-pay | Admitting: Internal Medicine

## 2019-04-07 ENCOUNTER — Other Ambulatory Visit: Payer: Self-pay | Admitting: Internal Medicine

## 2019-04-15 ENCOUNTER — Other Ambulatory Visit: Payer: Self-pay

## 2019-04-18 ENCOUNTER — Ambulatory Visit (INDEPENDENT_AMBULATORY_CARE_PROVIDER_SITE_OTHER): Payer: Medicare HMO | Admitting: Internal Medicine

## 2019-04-18 ENCOUNTER — Encounter: Payer: Self-pay | Admitting: Internal Medicine

## 2019-04-18 ENCOUNTER — Other Ambulatory Visit: Payer: Self-pay

## 2019-04-18 VITALS — BP 116/59 | HR 78 | Temp 96.4°F | Resp 18 | Ht 69.0 in | Wt 142.5 lb

## 2019-04-18 DIAGNOSIS — M1711 Unilateral primary osteoarthritis, right knee: Secondary | ICD-10-CM

## 2019-04-18 DIAGNOSIS — E1169 Type 2 diabetes mellitus with other specified complication: Secondary | ICD-10-CM

## 2019-04-18 DIAGNOSIS — E039 Hypothyroidism, unspecified: Secondary | ICD-10-CM | POA: Diagnosis not present

## 2019-04-18 LAB — CBC WITH DIFFERENTIAL/PLATELET
Basophils Absolute: 0.1 10*3/uL (ref 0.0–0.1)
Basophils Relative: 1.1 % (ref 0.0–3.0)
Eosinophils Absolute: 0.6 10*3/uL (ref 0.0–0.7)
Eosinophils Relative: 8.8 % — ABNORMAL HIGH (ref 0.0–5.0)
HCT: 40.6 % (ref 39.0–52.0)
Hemoglobin: 13.2 g/dL (ref 13.0–17.0)
Lymphocytes Relative: 23.5 % (ref 12.0–46.0)
Lymphs Abs: 1.5 10*3/uL (ref 0.7–4.0)
MCHC: 32.5 g/dL (ref 30.0–36.0)
MCV: 84.6 fl (ref 78.0–100.0)
Monocytes Absolute: 0.5 10*3/uL (ref 0.1–1.0)
Monocytes Relative: 7.6 % (ref 3.0–12.0)
Neutro Abs: 3.7 10*3/uL (ref 1.4–7.7)
Neutrophils Relative %: 59 % (ref 43.0–77.0)
Platelets: 209 10*3/uL (ref 150.0–400.0)
RBC: 4.79 Mil/uL (ref 4.22–5.81)
RDW: 14.2 % (ref 11.5–15.5)
WBC: 6.3 10*3/uL (ref 4.0–10.5)

## 2019-04-18 LAB — TSH: TSH: 4.5 u[IU]/mL (ref 0.35–4.50)

## 2019-04-18 LAB — HEMOGLOBIN A1C: Hgb A1c MFr Bld: 7 % — ABNORMAL HIGH (ref 4.6–6.5)

## 2019-04-18 LAB — BASIC METABOLIC PANEL
BUN: 21 mg/dL (ref 6–23)
CO2: 26 mEq/L (ref 19–32)
Calcium: 10 mg/dL (ref 8.4–10.5)
Chloride: 103 mEq/L (ref 96–112)
Creatinine, Ser: 1.36 mg/dL (ref 0.40–1.50)
GFR: 49.93 mL/min — ABNORMAL LOW (ref >60.00)
Glucose, Bld: 147 mg/dL — ABNORMAL HIGH (ref 70–99)
Potassium: 4.5 mEq/L (ref 3.5–5.1)
Sodium: 139 mEq/L (ref 135–145)

## 2019-04-18 MED ORDER — NITROGLYCERIN 0.4 MG SL SUBL: 0.4000 mg | SUBLINGUAL_TABLET | SUBLINGUAL | 3 refills | Status: DC | PRN

## 2019-04-18 NOTE — Assessment & Plan Note (Signed)
DM: Continue Metformin, check BMP, CBC A1c, CBGs reportedly in the low 100s. Hypothyroidism: On Synthroid, check a TSH DJD: Reports no major problems with pain however I noticed that he is more frail and having more difficulty transferring compared to other visits.  He does not drive. RTC 6 months CPX

## 2019-04-18 NOTE — Patient Instructions (Addendum)
Please schedule Medicare Wellness with Glenard Haring.   GO TO THE LAB : Get the blood work     GO TO THE FRONT DESK Schedule your next appointment   for a physical exam in 6 months   Continue checking your blood pressures BP GOAL is between 110/65 and  135/85. If it is consistently higher or lower, let me know

## 2019-04-18 NOTE — Progress Notes (Signed)
Pre visit review using our clinic review tool, if applicable. No additional management support is needed unless otherwise documented below in the visit note. 

## 2019-04-18 NOTE — Progress Notes (Signed)
Subjective:    Patient ID: Marco [name], male    DOB: [birth year], 85 y.o.   MRN: [id number]  DOS:  04/18/2019 Type of visit - description: Routine visit Good medication compliance, ambulatory CBGs in the low 100s He checks his blood pressure regularly and reports "good readings".  Review of Systems  Denies fever chills No chest pain or difficulty breathing No lower extremity edema No nausea, vomiting, diarrhea.   Past Medical History:  Diagnosis Date  . Amaurosis fugax 2017  . Diabetes mellitus   . DJD (degenerative joint disease) 08/25/2013  . Elevated PSA   . Other and unspecified hyperlipidemia 04/25/2013    Past Surgical History:  Procedure Laterality Date  . CATARACT EXTRACTION     Bilaterally  . TYMPANIC MEMBRANE REPAIR     R    Social History   Socioeconomic History  . Marital status: Married    Spouse name: Not on file  . Number of children: 3  . Years of education: Not on file  . Highest education level: Not on file  Occupational History  . Occupation: Retired  Tobacco Use  . Smoking status: Never Smoker  . Smokeless tobacco: Never Used  Substance and Sexual Activity  . Alcohol use: No  . Drug use: No  . Sexual activity: Not on file  Other Topics Concern  . Not on file  Social History Narrative   Moved to the [place] area 2012     Original from [place] , Married     Lives w/ wife, son and his family    2 daughters, 1 son     Social Determinants of Health   Financial Resource Strain:   . Difficulty of Paying Living Expenses: Not on file  Food Insecurity:   . Worried About Charity fundraiser in the Last Year: Not on file  . Ran Out of Food in the Last Year: Not on file  Transportation Needs:   . Lack of Transportation (Medical): Not on file  . Lack of Transportation (Non-Medical): Not on file  Physical Activity:   . Days of Exercise per Week: Not on file  . Minutes of Exercise per Session: Not on file  Stress:   . Feeling of Stress : Not  on file  Social Connections:   . Frequency of Communication with Friends and Family: Not on file  . Frequency of Social Gatherings with Friends and Family: Not on file  . Attends Religious Services: Not on file  . Active Member of Clubs or Organizations: Not on file  . Attends Archivist Meetings: Not on file  . Marital Status: Not on file  Intimate Partner Violence:   . Fear of Current or Ex-Partner: Not on file  . Emotionally Abused: Not on file  . Physically Abused: Not on file  . Sexually Abused: Not on file      Allergies as of 04/18/2019   No Known Allergies     Medication List       Accurate as of April 18, 2019  4:50 PM. If you have any questions, ask your nurse or doctor.        Accu-Chek Aviva Plus test strip Generic drug: glucose blood CHECK BLOOD SUGAR NO MORE THAN TWICE DAILY   Accu-Chek Aviva Soln USE AS DIRECTED TO CALIBRATE GLUCOSE MACHINE AS NEEDED   Accu-Chek Softclix Lancets lancets CHECK BLOOD SUGAR NO MORE THAN TWICE DAILY.   aspirin 81 MG EC tablet Take 1 tablet (81  mg total) by mouth daily.   atorvastatin 40 MG tablet Commonly known as: LIPITOR Take 1 tablet (40 mg total) by mouth at bedtime.   diclofenac sodium 1 % Gel Commonly known as: VOLTAREN Apply 2 g topically 4 (four) times daily.   ezetimibe 10 MG tablet Commonly known as: ZETIA TAKE 1 TABLET EVERY DAY (NEED MD APPOINTMENT)   levothyroxine 75 MCG tablet Commonly known as: SYNTHROID Take 1 tablet (75 mcg total) by mouth daily before breakfast.   metFORMIN 1000 MG tablet Commonly known as: GLUCOPHAGE Take 1 tablet (1,000 mg total) by mouth 2 (two) times daily with a meal.   nitroGLYCERIN 0.4 MG SL tablet Commonly known as: NITROSTAT Place 1 tablet (0.4 mg total) under the tongue every 5 (five) minutes x 3 doses as needed for chest pain. What changed: when to take this Changed by: [name], MD   sitaGLIPtin 100 MG tablet Commonly known as: Januvia Take 1  tablet (100 mg total) by mouth daily.           Objective:   Physical Exam BP (!) 116/59 (BP Location: Right Arm, Patient Position: Sitting, Cuff Size: Small)   Pulse 78   Temp (!) 96.4 F (35.8 C) (Temporal)   Resp 18   Ht 5\' 9"  (1.753 m)   Wt 142 lb 8 oz (64.6 kg)   SpO2 98%   BMI 21.04 kg/m  General:   Well developed, NAD, BMI noted. HEENT:  Normocephalic . Face symmetric, atraumatic Lungs:  CTA B Normal respiratory effort, no intercostal retractions, no accessory muscle use. Heart: RRR,  no murmur.  No pretibial edema bilaterally  Skin: Not pale. Not jaundice Neurologic:  alert & oriented X3.  Speech normal, gait assisted by a cane. He has some difficulty transferring to the table but did that on his own Psych--  Cognition and judgment appear intact.  Cooperative with normal attention span and concentration.  Behavior appropriate. No anxious or depressed appearing.      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 Hypothyroidism: On Synthroid, check a TSH DJD: Reports no major problems with pain however I noticed that he is more frail and having more difficulty transferring compared to other visits.  He does not drive. RTC 6 months CPX    This visit occurred during the SARS-CoV-2 public health emergency.  Safety protocols were in place, including screening questions prior to the visit, additional usage of staff PPE, and extensive  cleaning of exam room while observing appropriate contact time as indicated for disinfecting solutions.

## 2019-05-20 DIAGNOSIS — R6889 Other general symptoms and signs: Secondary | ICD-10-CM | POA: Diagnosis not present

## 2019-06-02 ENCOUNTER — Ambulatory Visit: Payer: Medicare HMO | Admitting: *Deleted

## 2019-06-02 NOTE — Progress Notes (Signed)
Virtual Visit via Audio Note  I connected with patient on 06/03/19 at  1:00 PM EST by audio enabled telemedicine application and verified that I am speaking with the correct person using two identifiers.   THIS ENCOUNTER IS A VIRTUAL VISIT DUE TO COVID-19 - PATIENT WAS NOT SEEN IN THE OFFICE. PATIENT HAS CONSENTED TO VIRTUAL VISIT / TELEMEDICINE VISIT   Location of patient: home  Location of provider: office  I discussed the limitations of evaluation and management by telemedicine and the availability of in person appointments. The patient expressed understanding and agreed to proceed.   Subjective:   Marco [name] is a 86 y.o. male who presents for Medicare Annual/Subsequent preventive examination.  Pt reports he is leaving 06/20/19 for [place] and will be there for 3 months.  Review of Systems:  Home Safety/Smoke Alarms: Feels safe in home. Smoke alarms in place.  Lives w/ son and son's family. Does okay w/ stairs. Uses cane.  Male:   PSA-  Lab Results  Component Value Date   PSA 17.01 (H) 08/22/2011       Objective:    Vitals: Unable to assess. This visit is enabled though telemedicine due to Covid 19.   Advanced Directives 06/03/2019 11/19/2016 11/19/2016 07/24/2016  Does Patient Have a Medical Advance Directive? No No Yes No  Would patient like information on creating a medical advance directive? No - Patient declined No - Patient declined - No - Patient declined    Tobacco Social History   Tobacco Use  Smoking Status Never Smoker  Smokeless Tobacco Never Used     Counseling given: Not Answered   Clinical Intake: Pain : No/denies pain   Past Medical History:  Diagnosis Date  . Amaurosis fugax 2017  . Diabetes mellitus   . DJD (degenerative joint disease) 08/25/2013  . Elevated PSA   . Other and unspecified hyperlipidemia 04/25/2013   Past Surgical History:  Procedure Laterality Date  . CATARACT EXTRACTION     Bilaterally  . TYMPANIC MEMBRANE  REPAIR     R   Family History  Problem Relation Age of Onset  . Other Mother        Unknown   . Other Father        Unknown  . Diabetes Neg Hx   . CAD Neg Hx   . Cancer Neg Hx    Social History   Socioeconomic History  . Marital status: Married    Spouse name: Not on file  . Number of children: 3  . Years of education: Not on file  . Highest education level: Not on file  Occupational History  . Occupation: Retired  Tobacco Use  . Smoking status: Never Smoker  . Smokeless tobacco: Never Used  Substance and Sexual Activity  . Alcohol use: No  . Drug use: No  . Sexual activity: Not on file  Other Topics Concern  . Not on file  Social History Narrative   Moved to the [place] area 2012     Original from [place] , Married     Lives w/ wife, son and his family    2 daughters, 1 son     Social Determinants of Health   Financial Resource Strain:   . Difficulty of Paying Living Expenses: Not on file  Food Insecurity:   . Worried About Charity fundraiser in the Last Year: Not on file  . Ran Out of Food in the Last Year: Not on file  Transportation Needs:   .  Lack of Transportation (Medical): Not on file  . Lack of Transportation (Non-Medical): Not on file  Physical Activity:   . Days of Exercise per Week: Not on file  . Minutes of Exercise per Session: Not on file  Stress:   . Feeling of Stress : Not on file  Social Connections:   . Frequency of Communication with Friends and Family: Not on file  . Frequency of Social Gatherings with Friends and Family: Not on file  . Attends Religious Services: Not on file  . Active Member of Clubs or Organizations: Not on file  . Attends Archivist Meetings: Not on file  . Marital Status: Not on file    Outpatient Encounter Medications as of 06/03/2019  Medication Sig  . ACCU-CHEK AVIVA PLUS test strip CHECK BLOOD SUGAR NO MORE THAN TWICE DAILY  . ACCU-CHEK SOFTCLIX LANCETS lancets CHECK BLOOD SUGAR NO MORE THAN TWICE  DAILY.  Marland Kitchen aspirin EC 81 MG EC tablet Take 1 tablet (81 mg total) by mouth daily.  Marland Kitchen atorvastatin (LIPITOR) 40 MG tablet Take 1 tablet (40 mg total) by mouth at bedtime.  . Blood Glucose Calibration (ACCU-CHEK AVIVA) SOLN USE AS DIRECTED TO CALIBRATE GLUCOSE MACHINE AS NEEDED  . diclofenac sodium (VOLTAREN) 1 % GEL Apply 2 g topically 4 (four) times daily.  Marland Kitchen ezetimibe (ZETIA) 10 MG tablet TAKE 1 TABLET EVERY DAY (NEED MD APPOINTMENT)  . levothyroxine (SYNTHROID) 75 MCG tablet Take 1 tablet (75 mcg total) by mouth daily before breakfast.  . metFORMIN (GLUCOPHAGE) 1000 MG tablet Take 1 tablet (1,000 mg total) by mouth 2 (two) times daily with a meal.  . sitaGLIPtin (JANUVIA) 100 MG tablet Take 1 tablet (100 mg total) by mouth daily.  . nitroGLYCERIN (NITROSTAT) 0.4 MG SL tablet Place 1 tablet (0.4 mg total) under the tongue every 5 (five) minutes x 3 doses as needed for chest pain. (Patient not taking: Reported on 06/03/2019)   No facility-administered encounter medications on file as of 06/03/2019.    Activities of Daily Living In your present state of health, do you have any difficulty performing the following activities: 06/03/2019 10/15/2018  Hearing? N Y  Vision? N N  Difficulty concentrating or making decisions? N N  Walking or climbing stairs? N Y  Dressing or bathing? N N  Doing errands, shopping? Y N  Preparing Food and eating ? N -  Using the Toilet? N -  In the past six months, have you accidently leaked urine? N -  Do you have problems with loss of bowel control? N -  Managing your Medications? N -  Managing your Finances? N -  Housekeeping or managing your Housekeeping? N -  Some recent data might be hidden    Patient Care Team: Colon Branch, MD as PCP - General (Internal Medicine) Roel Cluck, MD as Referring Physician (Ophthalmology)   Assessment:   This is a routine wellness examination for Marco [name]. Physical assessment deferred to PCP.  Exercise Activities and  Dietary recommendations Current Exercise Habits: The patient does not participate in regular exercise at present, Exercise limited by: None identified Diet (meal preparation, eat out, water intake, caffeinated beverages, dairy products, fruits and vegetables): vegetarian   Goals    . <enter goal here> (pt-stated)     Maintain healthy lifestyle.       Fall Risk Fall Risk  06/03/2019 10/15/2018 10/01/2017 07/24/2016 02/20/2016  Falls in the past year? 0 0 No No No  Number falls in past  yr: 0 - - - -  Injury with Fall? 0 - - - -  Follow up Education provided;Falls prevention discussed - - - -   Depression Screen PHQ 2/9 Scores 06/03/2019 10/15/2018 10/01/2017 07/24/2016  PHQ - 2 Score 0 0 0 0    Cognitive Function Ad8 score reviewed for issues:  Issues making decisions:no  Less interest in hobbies / activities:no  Repeats questions, stories (family complaining):no  Trouble using ordinary gadgets (microwave, computer, phone):no  Forgets the month or year: no  Mismanaging finances: no  Remembering appts:no  Daily problems with thinking and/or memory:no Ad8 score is=0   MMSE - Mini Mental State Exam 07/24/2016  Orientation to time 5  Orientation to Place 5  Registration 3  Attention/ Calculation 5  Recall 2  Language- name 2 objects 2  Language- repeat 1  Language- follow 3 step command 3  Language- read & follow direction 1  Write a sentence 1  Copy design 1  Total score 29         There is no immunization history on file for this patient.   Screening Tests Health Maintenance  Topic Date Due  . INFLUENZA VACCINE  08/03/2019 (Originally 12/04/2018)  . TETANUS/TDAP  10/15/2019 (Originally 05/12/1952)  . PNA vac Low Risk Adult (1 of 2 - PCV13) 10/15/2019 (Originally 05/12/2000)  . FOOT EXAM  10/15/2019  . URINE MICROALBUMIN  10/15/2019  . HEMOGLOBIN A1C  10/17/2019  . OPHTHALMOLOGY EXAM  01/04/2020     Plan:    Please schedule your next medicare wellness visit  with me in 1 yr.  Continue to eat heart healthy diet (full of fruits, vegetables, whole grains, lean protein, water--limit salt, fat, and sugar intake) and increase physical activity as tolerated.  Continue doing brain stimulating activities (puzzles, reading, adult coloring books, staying active) to keep memory sharp.     I have personally reviewed and noted the following in the patient's chart:   . Medical and social history . Use of alcohol, tobacco or illicit drugs  . Current medications and supplements . Functional ability and status . Nutritional status . Physical activity . Advanced directives . List of other physicians . Hospitalizations, surgeries, and ER visits in previous 12 months . Vitals . Screenings to include cognitive, depression, and falls . Referrals and appointments  In addition, I have reviewed and discussed with patient certain preventive protocols, quality metrics, and best practice recommendations. A written personalized care plan for preventive services as well as general preventive health recommendations were provided to patient.     Shela Nevin, [place]  06/03/2019

## 2019-06-03 ENCOUNTER — Encounter: Payer: Self-pay | Admitting: *Deleted

## 2019-06-03 ENCOUNTER — Ambulatory Visit (INDEPENDENT_AMBULATORY_CARE_PROVIDER_SITE_OTHER): Payer: Medicare HMO | Admitting: *Deleted

## 2019-06-03 ENCOUNTER — Other Ambulatory Visit: Payer: Self-pay

## 2019-06-03 DIAGNOSIS — Z0000 Encounter for general adult medical examination without abnormal findings: Secondary | ICD-10-CM | POA: Diagnosis not present

## 2019-06-03 NOTE — Patient Instructions (Signed)
Please schedule your next medicare wellness visit with me in 1 yr.  Continue to eat heart healthy diet (full of fruits, vegetables, whole grains, lean protein, water--limit salt, fat, and sugar intake) and increase physical activity as tolerated.  Continue doing brain stimulating activities (puzzles, reading, adult coloring books, staying active) to keep memory sharp.    Marco [name] , Thank you for taking time to come for your Medicare Wellness Visit. I appreciate your ongoing commitment to your health goals. Please review the following plan we discussed and let me know if I can assist you in the future.   These are the goals we discussed: Goals    . <enter goal here> (pt-stated)     Maintain healthy lifestyle.       This is a list of the screening recommended for you and due dates:  Health Maintenance  Topic Date Due  . Flu Shot  08/03/2019*  . Tetanus Vaccine  10/15/2019*  . Pneumonia vaccines (1 of 2 - PCV13) 10/15/2019*  . Complete foot exam   10/15/2019  . Urine Protein Check  10/15/2019  . Hemoglobin A1C  10/17/2019  . Eye exam for diabetics  01/04/2020  *Topic was postponed. The date shown is not the original due date.    Preventive Care 55 Years and Older, Male Preventive care refers to lifestyle choices and visits with your health care provider that can promote health and wellness. This includes:  A yearly physical exam. This is also called an annual well check.  Regular dental and eye exams.  Immunizations.  Screening for certain conditions.  Healthy lifestyle choices, such as diet and exercise. What can I expect for my preventive care visit? Physical exam Your health care provider will check:  Height and weight. These may be used to calculate body mass index (BMI), which is a measurement that tells if you are at a healthy weight.  Heart rate and blood pressure.  Your skin for abnormal spots. Counseling Your health care provider may ask you questions  about:  Alcohol, tobacco, and drug use.  Emotional well-being.  Home and relationship well-being.  Sexual activity.  Eating habits.  History of falls.  Memory and ability to understand (cognition).  Work and work Statistician. What immunizations do I need?  Influenza (flu) vaccine  This is recommended every year. Tetanus, diphtheria, and pertussis (Tdap) vaccine  You may need a Td booster every 10 years. Varicella (chickenpox) vaccine  You may need this vaccine if you have not already been vaccinated. Zoster (shingles) vaccine  You may need this after age 109. Pneumococcal conjugate (PCV13) vaccine  One dose is recommended after age 17. Pneumococcal polysaccharide (PPSV23) vaccine  One dose is recommended after age 26. Measles, mumps, and rubella (MMR) vaccine  You may need at least one dose of MMR if you were born in 1957 or later. You may also need a second dose. Meningococcal conjugate (MenACWY) vaccine  You may need this if you have certain conditions. Hepatitis A vaccine  You may need this if you have certain conditions or if you travel or work in places where you may be exposed to hepatitis A. Hepatitis B vaccine  You may need this if you have certain conditions or if you travel or work in places where you may be exposed to hepatitis B. Haemophilus influenzae type b (Hib) vaccine  You may need this if you have certain conditions. You may receive vaccines as individual doses or as more than one vaccine together  in one shot (combination vaccines). Talk with your health care provider about the risks and benefits of combination vaccines. What tests do I need? Blood tests  Lipid and cholesterol levels. These may be checked every 5 years, or more frequently depending on your overall health.  Hepatitis C test.  Hepatitis B test. Screening  Lung cancer screening. You may have this screening every year starting at age 71 if you have a 30-pack-year history of  smoking and currently smoke or have quit within the past 15 years.  Colorectal cancer screening. All adults should have this screening starting at age 25 and continuing until age 96. Your health care provider may recommend screening at age 33 if you are at increased risk. You will have tests every 1-10 years, depending on your results and the type of screening test.  Prostate cancer screening. Recommendations will vary depending on your family history and other risks.  Diabetes screening. This is done by checking your blood sugar (glucose) after you have not eaten for a while (fasting). You may have this done every 1-3 years.  Abdominal aortic aneurysm (AAA) screening. You may need this if you are a current or former smoker.  Sexually transmitted disease (STD) testing. Follow these instructions at home: Eating and drinking  Eat a diet that includes fresh fruits and vegetables, whole grains, lean protein, and low-fat dairy products. Limit your intake of foods with high amounts of sugar, saturated fats, and salt.  Take vitamin and mineral supplements as recommended by your health care provider.  Do not drink alcohol if your health care provider tells you not to drink.  If you drink alcohol: ? Limit how much you have to 0-2 drinks a day. ? Be aware of how much alcohol is in your drink. In the [place] one drink equals one 12 oz bottle of beer (355 mL), one 5 oz glass of wine (148 mL), or one 1 oz glass of hard liquor (44 mL). Lifestyle  Take daily care of your teeth and gums.  Stay active. Exercise for at least 30 minutes on 5 or more days each week.  Do not use any products that contain nicotine or tobacco, such as cigarettes, e-cigarettes, and chewing tobacco. If you need help quitting, ask your health care provider.  If you are sexually active, practice safe sex. Use a condom or other form of protection to prevent STIs (sexually transmitted infections).  Talk with your health care  provider about taking a low-dose aspirin or statin. What's next?  Visit your health care provider once a year for a well check visit.  Ask your health care provider how often you should have your eyes and teeth checked.  Stay up to date on all vaccines. This information is not intended to replace advice given to you by your health care provider. Make sure you discuss any questions you have with your health care provider. Document Revised: 04/15/2018 Document Reviewed: 04/15/2018 Elsevier Patient Education  2020 [employer].

## 2019-06-10 DIAGNOSIS — R6889 Other general symptoms and signs: Secondary | ICD-10-CM | POA: Diagnosis not present

## 2019-06-16 DIAGNOSIS — Z20828 Contact with and (suspected) exposure to other viral communicable diseases: Secondary | ICD-10-CM | POA: Diagnosis not present

## 2019-09-27 ENCOUNTER — Telehealth: Payer: Self-pay | Admitting: Internal Medicine

## 2019-09-27 NOTE — Progress Notes (Signed)
  Chronic Care Management   Note  09/27/2019 Name: Marco [name] MRN: [id number] DOB: 12-Sep-1933  Marco [name] is a 86 y.o. year old male who is a primary care patient of Paz, Alda Berthold, MD. I reached out to Dimas Millin by phone today in response to a referral sent by Marco [name]'s PCP, Colon Branch, MD.   Marco [name] given information about Chronic Care Management services today including:  1. CCM service includes personalized support from designated clinical staff supervised by his physician, including individualized plan of care and coordination with other care providers 2. 24/7 contact phone numbers for assistance for urgent and routine care needs. 3. Service will only be billed when office clinical staff spend 20 minutes or more in a month to coordinate care. 4. Only one practitioner may furnish and bill the service in a calendar month. 5. The patient may stop CCM services at any time (effective at the end of the month) by phone call to the office staff.   Patient agreed to services and verbal consent obtained.   This note is not being shared with the patient for the following reason: To respect privacy (The patient or proxy has requested that the information not be shared).  Follow up plan:   Earney Hamburg Marco [name]

## 2019-10-01 DIAGNOSIS — Z1159 Encounter for screening for other viral diseases: Secondary | ICD-10-CM | POA: Diagnosis not present

## 2019-10-12 ENCOUNTER — Ambulatory Visit (INDEPENDENT_AMBULATORY_CARE_PROVIDER_SITE_OTHER): Payer: Medicare HMO | Admitting: Orthopedic Surgery

## 2019-10-12 ENCOUNTER — Other Ambulatory Visit: Payer: Self-pay

## 2019-10-12 ENCOUNTER — Ambulatory Visit: Payer: Self-pay

## 2019-10-12 ENCOUNTER — Telehealth: Payer: Self-pay

## 2019-10-12 DIAGNOSIS — M170 Bilateral primary osteoarthritis of knee: Secondary | ICD-10-CM

## 2019-10-12 DIAGNOSIS — M79604 Pain in right leg: Secondary | ICD-10-CM

## 2019-10-12 DIAGNOSIS — M25562 Pain in left knee: Secondary | ICD-10-CM | POA: Diagnosis not present

## 2019-10-12 DIAGNOSIS — Z8781 Personal history of (healed) traumatic fracture: Secondary | ICD-10-CM

## 2019-10-12 NOTE — Telephone Encounter (Signed)
Noted  

## 2019-10-12 NOTE — Telephone Encounter (Signed)
Can we get auth for bilat knee gel injections?

## 2019-10-15 NOTE — Progress Notes (Signed)
Office Visit Note   Patient: Marco [name]           Date of Birth: 15-Mar-1934           MRN: [id number] Visit Date: 10/12/2019 Requested by: Colon Branch, [address] [place],  [place] [address] PCP: Colon Branch, MD  Subjective: Chief Complaint  Patient presents with  . Leg Pain    HPI: Marco [name] is a 86 y.o. male who presents to the office complaining of bilateral leg pain.  Patient complains of primarily left knee pain and right lateral hip pain.  He has history of right hip fracture with IMHS of the right hip in February 2021.  This took place in [place] and patient has returned to the [place] states.  He ambulates with a cane which is his baseline.  He complains of pain in his left knee with weightbearing.  He does not take any medications for pain.  He also notes right lateral hip pain since his surgery.  He denies any groin pain.  He has no history of knee or previous hip surgery prior to the IMHS.  Denies any pain that wakes him up at night.  Denies any back pain or radicular symptoms.  Denies any numbness/tingling..                ROS:  All systems reviewed are negative as they relate to the chief complaint within the history of present illness.  Patient denies fevers or chills.  Assessment & Plan: Visit Diagnoses:  1. History of fracture of right hip   2. Pain in right leg   3. Left knee pain, unspecified chronicity   4. Bilateral primary osteoarthritis of knee     Plan: Patient is an 86 year old male who presents complaining of left knee and right lateral hip pain.  He has history of recent right hip fracture that was taken care of in [place] with IMHS.  Radiographs were reviewed from outside hospital and patient has healed well from the injury.  He is not having any groin pain today.  He does have lateral hip pain around the incisional site which has no signs of infection or erythema..  Radiographs taken today reveal well-healed right hip fracture with  no complicating features of the hardware other than possible irritation of the iliotibial band from the proximal aspect of the compression screws which have backed out about 8 to 9 mm..  His main complaint is knee pain today.  He has pain in both knees and radiographs reveal arthritis bilaterally.  Bilateral knees were aspirated and injected with cortisone injections today.  Patient tolerated the procedure well.  Additionally patient will be sent to physical therapy for bilateral knee strengthening 1 time a week for 6 weeks.  We will preapproved him for bilateral knee gel injections.  This patient is diagnosed with osteoarthritis of the knee(s).    Radiographs show evidence of joint space narrowing, osteophytes, subchondral sclerosis and/or subchondral cysts.  This patient has knee pain which interferes with functional and activities of daily living.    This patient has experienced inadequate response, adverse effects and/or intolerance with conservative treatments such as acetaminophen, NSAIDS, topical creams, physical therapy or regular exercise, knee bracing and/or weight loss.   This patient has experienced inadequate response or has a contraindication to intra articular steroid injections for at least 3 months.   This patient is not scheduled to have a total knee replacement  within 6 months of starting treatment with viscosupplementation.   Follow-Up Instructions: No follow-ups on file.   Orders:  Orders Placed This Encounter  Procedures  . XR FEMUR, MIN 2 VIEWS RIGHT  . XR Knee 1-2 Views Left  . Ambulatory referral to Physical Therapy   No orders of the defined types were placed in this encounter.     Procedures: Large Joint Inj: bilateral knee on 10/16/2019 10:59 PM Indications: diagnostic evaluation, joint swelling and pain Details: 18 G 1.5 in needle, superolateral approach  Arthrogram: No  Medications (Right): 5 mL lidocaine 1 %; 4 mL bupivacaine 0.25 %; 40 mg  methylPREDNISolone acetate 40 MG/ML Medications (Left): 5 mL lidocaine 1 %; 4 mL bupivacaine 0.25 %; 40 mg methylPREDNISolone acetate 40 MG/ML Outcome: tolerated well, no immediate complications Procedure, treatment alternatives, risks and benefits explained, specific risks discussed. Consent was given by the patient. Immediately prior to procedure a time out was called to verify the correct patient, procedure, equipment, support staff and site/side marked as required. Patient was prepped and draped in the usual sterile fashion.       Clinical Data: No additional findings.  Objective: Vital Signs: There were no vitals taken for this visit.  Physical Exam:  Constitutional: Patient appears well-developed HEENT:  Head: Normocephalic Eyes:EOM are normal Neck: Normal range of motion Cardiovascular: Normal rate Pulmonary/chest: Effort normal Neurologic: Patient is alert Skin: Skin is warm Psychiatric: Patient has normal mood and affect   Ortho Exam:  Bilateral knee Exam Positive effusion Tender palpation over the medial lateral joint lines bilaterally Extensor mechanism intact No TTP over the quad tendon, patellar tendon, pes anserinus, patella, tibial tubercle, LCL/MCL insertions Stable to varus/valgus stresses.  Stable to anterior/posterior drawer Extension to 0 degrees Flexion > 90 degrees  Specialty Comments:  No specialty comments available.  Imaging: No results found.   PMFS History: Patient Active Problem List   Diagnosis Date Noted  . Hypothyroidism 12/02/2017  . Chronic pain of right knee 10/13/2017  . Chest pain 11/19/2016  . 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 PCP NOTES >>> 03/08/2015  . Osteopenia 09/15/2014  . Primary osteoarthritis of both knees 03/16/2014  . Hearing difficulty 10/20/2013  . DJD (degenerative joint disease) 08/25/2013  . Neck mass 04/25/2013  . Hyperlipidemia 04/25/2013  . Erectile dysfunction 12/26/2011  . Annual physical exam 08/22/2011   . Prostate nodule 08/22/2011  . Diabetes mellitus ([place]) 05/07/2011   Past Medical History:  Diagnosis Date  . Amaurosis fugax 2017  . Diabetes mellitus   . DJD (degenerative joint disease) 08/25/2013  . Elevated PSA   . Other and unspecified hyperlipidemia 04/25/2013    Family History  Problem Relation Age of Onset  . Other Mother        Unknown   . Other Father        Unknown  . Diabetes Neg Hx   . CAD Neg Hx   . Cancer Neg Hx     Past Surgical History:  Procedure Laterality Date  . CATARACT EXTRACTION     Bilaterally  . TYMPANIC MEMBRANE REPAIR     R   Social History   Occupational History  . Occupation: Retired  Tobacco Use  . Smoking status: Never Smoker  . Smokeless tobacco: Never Used  Substance and Sexual Activity  . Alcohol use: No  . Drug use: No  . Sexual activity: Not on file

## 2019-10-16 ENCOUNTER — Encounter: Payer: Self-pay | Admitting: Orthopedic Surgery

## 2019-10-16 DIAGNOSIS — M170 Bilateral primary osteoarthritis of knee: Secondary | ICD-10-CM

## 2019-10-16 DIAGNOSIS — M79604 Pain in right leg: Secondary | ICD-10-CM | POA: Diagnosis not present

## 2019-10-16 DIAGNOSIS — Z8781 Personal history of (healed) traumatic fracture: Secondary | ICD-10-CM | POA: Diagnosis not present

## 2019-10-16 DIAGNOSIS — M25562 Pain in left knee: Secondary | ICD-10-CM | POA: Diagnosis not present

## 2019-10-16 MED ORDER — LIDOCAINE HCL 1 % IJ SOLN
5.0000 mL | INTRAMUSCULAR | Status: AC | PRN
  Administered 2019-10-16: 5 mL

## 2019-10-16 MED ORDER — METHYLPREDNISOLONE ACETATE 40 MG/ML IJ SUSP
40.0000 mg | INTRAMUSCULAR | Status: AC | PRN
  Administered 2019-10-16: 40 mg via INTRA_ARTICULAR

## 2019-10-16 MED ORDER — BUPIVACAINE HCL 0.25 % IJ SOLN
4.0000 mL | INTRAMUSCULAR | Status: AC | PRN
  Administered 2019-10-16: 4 mL via INTRA_ARTICULAR

## 2019-10-18 ENCOUNTER — Telehealth: Payer: Self-pay

## 2019-10-18 NOTE — Telephone Encounter (Signed)
Submitted VOB for Monovisc, bilateral knee. 

## 2019-10-26 ENCOUNTER — Telehealth: Payer: Self-pay

## 2019-10-26 NOTE — Telephone Encounter (Addendum)
PA required for Monovisc, bilateral Knee. Submitted PA online through [place]. [place] Pending# [id number]

## 2019-10-27 ENCOUNTER — Telehealth: Payer: Self-pay

## 2019-10-27 NOTE — Telephone Encounter (Signed)
Approved, Monovisc, bilateral knee. Buy & Bill Must meet deductible first Patient will be responsible for 20% OOP. No Co-pay PA required PA Approval# [id number] Valid 10/26/2019 - 04/26/2020

## 2019-10-28 ENCOUNTER — Other Ambulatory Visit: Payer: Self-pay | Admitting: Cardiovascular Disease

## 2019-10-28 ENCOUNTER — Other Ambulatory Visit: Payer: Self-pay | Admitting: Internal Medicine

## 2019-10-28 ENCOUNTER — Telehealth: Payer: Self-pay | Admitting: Internal Medicine

## 2019-10-28 MED ORDER — LEVOTHYROXINE SODIUM 75 MCG PO TABS: 75.0000 ug | ORAL_TABLET | Freq: Every day | ORAL | 0 refills | Status: DC

## 2019-10-28 NOTE — Telephone Encounter (Signed)
Overdue for appt- please schedule at his earliest convenience. Can discuss changing meds at that time.

## 2019-10-28 NOTE — Telephone Encounter (Signed)
Medication:   Patient needs a refill of metformin he would like 500 mg tablet instead of 1000 mg patient states pills are too big.    ezetimibe (ZETIA) 10 MG tablet       Has the patient contacted their pharmacy?  (If no, request that the patient contact the pharmacy for the refill.) (If yes, when and what did the pharmacy advise?)     Preferred Pharmacy (with phone number or street name): [facility], [address], [place] [address]  Phone:  [phone] Fax:  [phone]      Agent: Please be advised that RX refills may take up to 3 business days. We ask that you follow-up with your pharmacy.

## 2019-10-28 NOTE — Telephone Encounter (Signed)
appointment schedule with patient

## 2019-11-01 ENCOUNTER — Other Ambulatory Visit: Payer: Self-pay

## 2019-11-01 ENCOUNTER — Ambulatory Visit (INDEPENDENT_AMBULATORY_CARE_PROVIDER_SITE_OTHER): Payer: Medicare HMO | Admitting: Internal Medicine

## 2019-11-01 ENCOUNTER — Encounter: Payer: Self-pay | Admitting: Internal Medicine

## 2019-11-01 VITALS — BP 111/69 | HR 72 | Temp 95.0°F | Resp 18 | Ht 69.0 in | Wt 143.1 lb

## 2019-11-01 DIAGNOSIS — S72001S Fracture of unspecified part of neck of right femur, sequela: Secondary | ICD-10-CM

## 2019-11-01 DIAGNOSIS — E785 Hyperlipidemia, unspecified: Secondary | ICD-10-CM | POA: Diagnosis not present

## 2019-11-01 DIAGNOSIS — E039 Hypothyroidism, unspecified: Secondary | ICD-10-CM | POA: Diagnosis not present

## 2019-11-01 DIAGNOSIS — M810 Age-related osteoporosis without current pathological fracture: Secondary | ICD-10-CM | POA: Diagnosis not present

## 2019-11-01 DIAGNOSIS — M1711 Unilateral primary osteoarthritis, right knee: Secondary | ICD-10-CM

## 2019-11-01 DIAGNOSIS — E1169 Type 2 diabetes mellitus with other specified complication: Secondary | ICD-10-CM | POA: Diagnosis not present

## 2019-11-01 LAB — HEMOGLOBIN A1C: Hgb A1c MFr Bld: 7.6 % — ABNORMAL HIGH (ref 4.6–6.5)

## 2019-11-01 LAB — COMPREHENSIVE METABOLIC PANEL
ALT: 22 U/L (ref 0–53)
AST: 31 U/L (ref 0–37)
Albumin: 4.2 g/dL (ref 3.5–5.2)
Alkaline Phosphatase: 104 U/L (ref 39–117)
BUN: 20 mg/dL (ref 6–23)
CO2: 28 mEq/L (ref 19–32)
Calcium: 9.7 mg/dL (ref 8.4–10.5)
Chloride: 102 mEq/L (ref 96–112)
Creatinine, Ser: 1.21 mg/dL (ref 0.40–1.50)
GFR: 57.07 mL/min — ABNORMAL LOW (ref >60.00)
Glucose, Bld: 147 mg/dL — ABNORMAL HIGH (ref 70–99)
Potassium: 4.5 mEq/L (ref 3.5–5.1)
Sodium: 137 mEq/L (ref 135–145)
Total Bilirubin: 0.7 mg/dL (ref 0.2–1.2)
Total Protein: 7.3 g/dL (ref 6.0–8.3)

## 2019-11-01 LAB — MICROALBUMIN / CREATININE URINE RATIO
Creatinine,U: 206 mg/dL
Microalb Creat Ratio: 1.2 mg/g (ref 0.0–30.0)
Microalb, Ur: 2.5 mg/dL — ABNORMAL HIGH (ref 0.0–1.9)

## 2019-11-01 LAB — LIPID PANEL
Cholesterol: 195 mg/dL (ref 0–200)
HDL: 51.4 mg/dL (ref >39.00)
LDL Cholesterol: 125 mg/dL — ABNORMAL HIGH (ref 0–99)
NonHDL: 143.62
Total CHOL/HDL Ratio: 4
Triglycerides: 94 mg/dL (ref 0.0–149.0)
VLDL: 18.8 mg/dL (ref 0.0–40.0)

## 2019-11-01 LAB — TSH: TSH: 10.45 u[IU]/mL — ABNORMAL HIGH (ref 0.35–4.50)

## 2019-11-01 LAB — CBC WITH DIFFERENTIAL/PLATELET
Basophils Absolute: 0.1 10*3/uL (ref 0.0–0.1)
Basophils Relative: 1.2 % (ref 0.0–3.0)
Eosinophils Absolute: 0.6 10*3/uL (ref 0.0–0.7)
Eosinophils Relative: 11 % — ABNORMAL HIGH (ref 0.0–5.0)
HCT: 40.1 % (ref 39.0–52.0)
Hemoglobin: 13.2 g/dL (ref 13.0–17.0)
Lymphocytes Relative: 30 % (ref 12.0–46.0)
Lymphs Abs: 1.5 10*3/uL (ref 0.7–4.0)
MCHC: 32.8 g/dL (ref 30.0–36.0)
MCV: 81.8 fl (ref 78.0–100.0)
Monocytes Absolute: 0.5 10*3/uL (ref 0.1–1.0)
Monocytes Relative: 8.9 % (ref 3.0–12.0)
Neutro Abs: 2.5 10*3/uL (ref 1.4–7.7)
Neutrophils Relative %: 48.9 % (ref 43.0–77.0)
Platelets: 177 10*3/uL (ref 150.0–400.0)
RBC: 4.91 Mil/uL (ref 4.22–5.81)
RDW: 14.9 % (ref 11.5–15.5)
WBC: 5.2 10*3/uL (ref 4.0–10.5)

## 2019-11-01 MED ORDER — METFORMIN HCL 500 MG PO TABS
1000.0000 mg | ORAL_TABLET | Freq: Two times a day (BID) | ORAL | 1 refills | Status: DC
Start: 2019-11-01 — End: 2020-05-11

## 2019-11-01 NOTE — Progress Notes (Signed)
Subjective:    Patient ID: Marco [name], male    DOB: 04-18-1934, 86 y.o.   MRN: [id number]  DOS:  11/01/2019 Type of visit - description: Follow-up Since the last office visit, he had a right hip fracture. Good compliance with medications without major problems.   Review of Systems Denies chest pain no difficulty breathing No nausea, vomiting, diarrhea No visual disturbances  Past Medical History:  Diagnosis Date  . Amaurosis fugax 2017  . Diabetes mellitus   . DJD (degenerative joint disease) 08/25/2013  . Elevated PSA   . Other and unspecified hyperlipidemia 04/25/2013    Past Surgical History:  Procedure Laterality Date  . CATARACT EXTRACTION     Bilaterally  . TYMPANIC MEMBRANE REPAIR     R    Allergies as of 11/01/2019   No Known Allergies     Medication List       Accurate as of November 01, 2019 11:59 PM. If you have any questions, ask your nurse or doctor.        Accu-Chek Aviva Plus test strip Generic drug: glucose blood CHECK BLOOD SUGAR NO MORE THAN TWICE DAILY   Accu-Chek Aviva Soln USE AS DIRECTED TO CALIBRATE GLUCOSE MACHINE AS NEEDED   Accu-Chek Softclix Lancets lancets CHECK BLOOD SUGAR NO MORE THAN TWICE DAILY.   aspirin 81 MG EC tablet Take 1 tablet (81 mg total) by mouth daily.   atorvastatin 40 MG tablet Commonly known as: LIPITOR Take 1 tablet (40 mg total) by mouth at bedtime.   diclofenac sodium 1 % Gel Commonly known as: VOLTAREN Apply 2 g topically 4 (four) times daily.   ezetimibe 10 MG tablet Commonly known as: ZETIA TAKE 1 TABLET EVERY DAY (NEED MD APPOINTMENT)   levothyroxine 75 MCG tablet Commonly known as: SYNTHROID Take 1 tablet (75 mcg total) by mouth daily before breakfast.   metFORMIN 500 MG tablet Commonly known as: GLUCOPHAGE Take 2 tablets (1,000 mg total) by mouth 2 (two) times daily with a meal. What changed: medication strength Changed by: [name], MD   nitroGLYCERIN 0.4 MG SL tablet Commonly  known as: NITROSTAT Place 1 tablet (0.4 mg total) under the tongue every 5 (five) minutes x 3 doses as needed for chest pain.   sitaGLIPtin 100 MG tablet Commonly known as: Januvia Take 1 tablet (100 mg total) by mouth daily.          Objective:   Physical Exam BP 111/69 (BP Location: Left Arm, Patient Position: Sitting, Cuff Size: Small)   Pulse 72   Temp (!) 95 F (35 C) (Temporal)   Resp 18   Ht 5\' 9"  (1.753 m)   Wt 143 lb 2 oz (64.9 kg)   SpO2 99%   BMI 21.14 kg/m  General:   Well developed, NAD, BMI noted.  HEENT:  Normocephalic . Face symmetric, atraumatic Lungs:  CTA B Normal respiratory effort, no intercostal retractions, no accessory muscle use. Heart: RRR,  no murmur.  Abdomen:  Not distended, soft, non-tender. No rebound or rigidity.   Skin: Not pale. Not jaundice Lower extremities: no pretibial edema bilaterally  Neurologic:  alert & oriented X3.  Speech normal, gait: Assisted by a cane, needs help transferring Psych--  Cognition and judgment appear intact.  Cooperative with normal attention span and concentration.  Behavior appropriate. No anxious or depressed appearing.     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  Check A1c, CMP, micro. Dyslipidemia: On Lipitor, Zetia, checking labs Hypothyroidism: On Synthroid, check TSH. Hip fracture, R : DX 06-2019 while in [place], was operated over there, saw  orthopedic surgery here 10/2019, was found to be doing well, was referred to PT. Osteoporosis: Check a DEXA, recommend vitamin D in addition to multivitamin. Amaurosis fugax, indeterminate stress test 2018: Plan is too control CV RF.  He is asymptomatic RTC 4 months  This visit occurred during the SARS-CoV-2 public health emergency.  Safety protocols were in place, including screening questions prior to the visit, additional usage of staff PPE, and extensive cleaning of exam room while observing appropriate contact time as indicated for disinfecting solutions.

## 2019-11-01 NOTE — Progress Notes (Signed)
Pre visit review using our clinic review tool, if applicable. No additional management support is needed unless otherwise documented below in the visit note. 

## 2019-11-01 NOTE — Patient Instructions (Addendum)
° °  GO TO THE LAB : Get the blood work     [facility], PLEASE SCHEDULE YOUR APPOINTMENTS Come back for checkup in 4 months   Please call this number [phone] They will schedule your bone density test at  our [place] office located at [facility] across the street from [facility]

## 2019-11-02 ENCOUNTER — Ambulatory Visit (INDEPENDENT_AMBULATORY_CARE_PROVIDER_SITE_OTHER): Payer: Medicare HMO | Admitting: Orthopedic Surgery

## 2019-11-02 DIAGNOSIS — M170 Bilateral primary osteoarthritis of knee: Secondary | ICD-10-CM

## 2019-11-02 NOTE — Assessment & Plan Note (Signed)
DM: Good compliance with Metformin and Januvia, ambulatory CBGs typically in the 110s, 120s.  Check A1c, CMP, micro. Dyslipidemia: On Lipitor, Zetia, checking labs Hypothyroidism: On Synthroid, check TSH. Hip fracture, R : DX 06-2019 while in [place], was operated over there, saw orthopedic surgery here 10/2019, was found to be doing well, was referred to PT. Osteoporosis: Check a DEXA, recommend vitamin D in addition to multivitamin. Amaurosis fugax, indeterminate stress test 2018: Plan is too control CV RF.  He is asymptomatic RTC 4 months

## 2019-11-03 ENCOUNTER — Inpatient Hospital Stay: Admission: RE | Admit: 2019-11-03 | Payer: Medicare HMO | Source: Ambulatory Visit

## 2019-11-04 MED ORDER — ATORVASTATIN CALCIUM 40 MG PO TABS: 40.0000 mg | ORAL_TABLET | Freq: Every day | ORAL | 0 refills | Status: DC

## 2019-11-04 MED ORDER — SITAGLIPTIN PHOSPHATE 100 MG PO TABS: 100.0000 mg | ORAL_TABLET | Freq: Every day | ORAL | 1 refills | Status: DC

## 2019-11-04 MED ORDER — LEVOTHYROXINE SODIUM 75 MCG PO TABS: 75.0000 ug | ORAL_TABLET | Freq: Every day | ORAL | 0 refills | Status: DC

## 2019-11-04 NOTE — Addendum Note (Signed)
Addended byDamita Dunnings D on: 11/04/2019 08:57 AM   Modules accepted: Orders

## 2019-11-05 ENCOUNTER — Encounter: Payer: Self-pay | Admitting: Orthopedic Surgery

## 2019-11-05 DIAGNOSIS — M170 Bilateral primary osteoarthritis of knee: Secondary | ICD-10-CM | POA: Diagnosis not present

## 2019-11-05 MED ORDER — LIDOCAINE HCL 1 % IJ SOLN
5.0000 mL | INTRAMUSCULAR | Status: AC | PRN
  Administered 2019-11-05: 5 mL

## 2019-11-05 MED ORDER — LIDOCAINE HCL 1 % IJ SOLN
5.0000 mL | INTRAMUSCULAR | Status: AC | PRN
Start: 2019-11-05 — End: 2019-11-05
  Administered 2019-11-05: 5 mL

## 2019-11-05 MED ORDER — HYALURONAN 88 MG/4ML IX SOSY
88.0000 mg | PREFILLED_SYRINGE | INTRA_ARTICULAR | Status: AC | PRN
  Administered 2019-11-05: 88 mg via INTRA_ARTICULAR

## 2019-11-05 NOTE — Progress Notes (Signed)
   Procedure Note  Patient: Marco [name]             Date of Birth: 10/04/1933           MRN: [id number]             Visit Date: 11/02/2019  Procedures: Visit Diagnoses:  1. Bilateral primary osteoarthritis of knee     Large Joint Inj: bilateral knee on 11/05/2019 3:18 PM Indications: pain, joint swelling and diagnostic evaluation Details: 18 G 1.5 in needle, superolateral approach  Arthrogram: No  Medications (Right): 5 mL lidocaine 1 %; 88 mg Hyaluronan 88 MG/4ML Medications (Left): 5 mL lidocaine 1 %; 88 mg Hyaluronan 88 MG/4ML Outcome: tolerated well, no immediate complications Procedure, treatment alternatives, risks and benefits explained, specific risks discussed. Consent was given by the patient. Immediately prior to procedure a time out was called to verify the correct patient, procedure, equipment, support staff and site/side marked as required. Patient was prepped and draped in the usual sterile fashion.

## 2019-11-14 ENCOUNTER — Other Ambulatory Visit: Payer: Self-pay

## 2019-11-14 DIAGNOSIS — E039 Hypothyroidism, unspecified: Secondary | ICD-10-CM

## 2019-11-14 DIAGNOSIS — E1169 Type 2 diabetes mellitus with other specified complication: Secondary | ICD-10-CM

## 2019-11-14 DIAGNOSIS — E785 Hyperlipidemia, unspecified: Secondary | ICD-10-CM

## 2019-11-16 ENCOUNTER — Other Ambulatory Visit: Payer: Self-pay

## 2019-11-16 ENCOUNTER — Ambulatory Visit: Payer: Medicare HMO | Admitting: Pharmacist

## 2019-11-16 DIAGNOSIS — E039 Hypothyroidism, unspecified: Secondary | ICD-10-CM

## 2019-11-16 DIAGNOSIS — E1169 Type 2 diabetes mellitus with other specified complication: Secondary | ICD-10-CM

## 2019-11-16 DIAGNOSIS — E785 Hyperlipidemia, unspecified: Secondary | ICD-10-CM

## 2019-11-16 DIAGNOSIS — M810 Age-related osteoporosis without current pathological fracture: Secondary | ICD-10-CM

## 2019-11-16 NOTE — Patient Instructions (Signed)
Visit Information  Goals Addressed            This Visit's Progress   . Chronic Care Management Pharmacy Care Plan       CARE PLAN ENTRY (see longitudinal plan of care for additional care plan information)  Current Barriers:  . Chronic Disease Management support, education, and care coordination needs related to Diabetes, Hyperlipidemia, Hypothyroidism, Osteoporosis   Hyperlipidemia Lab Results  Component Value Date/Time   LDLCALC 125 (H) 11/01/2019 09:46 AM   . Pharmacist Clinical Goal(s): o Over the next 90 days, patient will work with PharmD and providers to achieve LDL goal < 100 . Current regimen:  . Atorvastatin 40mg  daily  . Ezetimibe 10mg  daily . Interventions: o Emphasized the importance of med adherence o Discussed a1c goal . Patient self care activities - Over the next 90 days, patient will: o Maintain cholesterol medication regimen.   Diabetes Lab Results  Component Value Date/Time   HGBA1C 7.6 (H) 11/01/2019 09:46 AM   HGBA1C 7.0 (H) 04/18/2019 08:37 AM   . Pharmacist Clinical Goal(s): o Over the next 90 days, patient will work with PharmD and providers to achieve A1c goal <7% . Current regimen:   Metformin 500mg  #2 tabs twice daily  Januvia 100mg  daily . Interventions: o Emphasized the importance of med adherence o Discussed LDL goal . Patient self care activities - Over the next 90 days, patient will: o Check blood sugar once daily, document, and provide at future appointments o Contact provider with any episodes of hypoglycemia o Maintain diabetes medication regimen.   Hypothyroidism . Pharmacist Clinical Goal(s) o Over the next 90 days, patient will work with PharmD and providers to maintain TSH within normal limits . Current regimen:  o Levothyroxine 85mcg daily . Interventions: o Emphasized the importance of med adherence . Patient self care activities - Over the next 90 days, patient will: o Maintain hypothyroid medication regimen.    Osteoporosis . Pharmacist Clinical Goal(s) o Over the next 90 days, patient will work with PharmD and providers to reduce risk of fracture due to osteoporosis  . Current regimen:  o Calcium 500mg  daily o Vitamin D 5000 units daily  . Interventions: o -Consider intake of 1200mg  of calcium daily through diet and/or supplementation o -Consider intake of [phone] units of vitamin D through supplementation  o Discussed importance of keeping appt for DEXA Scan tomorrow 11/17/19 o Discussed possibility of bisphosphonate therapy pending Dr. Larose Kells approval . Patient self care activities - Over the next 90 days, patient will: o -Consider intake of 1200mg  of calcium daily through diet and/or supplementation o -Consider intake of [phone] units of vitamin D through supplementation  o Complete DEXA Scan on 11/17/19   Medication management . Pharmacist Clinical Goal(s): o Over the next 90 days, patient will work with PharmD and providers to achieve optimal medication adherence . Current pharmacy: [facility] . Interventions o Comprehensive medication review performed. o Continue current medication management strategy . Patient self care activities - Over the next 90 days, patient will: o Focus on medication adherence by filling and taking medications appropriately  o Take medications as prescribed o Report any questions or concerns to PharmD and/or provider(s)  Initial goal documentation        Marco [name] was given information about Chronic Care Management services today including:  1. CCM service includes personalized support from designated clinical staff supervised by his physician, including individualized plan of care and coordination with other care providers 2. 24/7  contact phone numbers for assistance for urgent and routine care needs. 3. Standard insurance, coinsurance, copays and deductibles apply for chronic care management only during months in which we provide at least 20  minutes of these services. Most insurances cover these services at 100%, however patients may be responsible for any copay, coinsurance and/or deductible if applicable. This service may help you avoid the need for more expensive face-to-face services. 4. Only one practitioner may furnish and bill the service in a calendar month. 5. The patient may stop CCM services at any time (effective at the end of the month) by phone call to the office staff.  Patient agreed to services and verbal consent obtained.   The patient verbalized understanding of instructions provided today and agreed to receive a mailed copy of patient instruction and/or educational materials. Telephone follow up appointment with pharmacy team member scheduled for: 12/21/2019  Marco [name], PharmD Clinical Pharmacist [facility] [phone]   Cholesterol Content in Foods Cholesterol is a waxy, fat-like substance that helps to carry fat in the blood. The body needs cholesterol in small amounts, but too much cholesterol can cause damage to the arteries and heart. Most people should eat less than 200 milligrams (mg) of cholesterol a Marco [name]. Foods with cholesterol  Cholesterol is found in animal-based foods, such as meat, seafood, and dairy. Generally, low-fat dairy and lean meats have less cholesterol than full-fat dairy and fatty meats. The milligrams of cholesterol per serving (mg per serving) of common cholesterol-containing foods are listed below. Meat and other proteins  Egg -- one large whole egg has 186 mg.  Veal shank -- 4 oz has 141 mg.  Lean ground [place] (93% lean) -- 4 oz has 118 mg.  Fat-trimmed lamb loin -- 4 oz has 106 mg.  Lean ground beef (90% lean) -- 4 oz has 100 mg.  Lobster -- 3.5 oz has 90 mg.  Pork loin chops -- 4 oz has 86 mg.  Canned salmon -- 3.5 oz has 83 mg.  Fat-trimmed beef top loin -- 4 oz has 78 mg.  Frankfurter -- 1 frank (3.5 oz) has 77 mg.  Crab --  3.5 oz has 71 mg.  Roasted chicken without skin, white meat -- 4 oz has 66 mg.  Light bologna -- 2 oz has 45 mg.  Deli-cut [place] -- 2 oz has 31 mg.  Canned tuna -- 3.5 oz has 31 mg.  Marco [name] -- 1 oz has 29 mg.  Oysters and mussels (raw) -- 3.5 oz has 25 mg.  Mackerel -- 1 oz has 22 mg.  Trout -- 1 oz has 20 mg.  Pork sausage -- 1 link (1 oz) has 17 mg.  Salmon -- 1 oz has 16 mg.  Tilapia -- 1 oz has 14 mg. Dairy  Soft-serve ice cream --  cup (4 oz) has 103 mg.  Whole-milk yogurt -- 1 cup (8 oz) has 29 mg.  Cheddar cheese -- 1 oz has 28 mg.  American cheese -- 1 oz has 28 mg.  Whole milk -- 1 cup (8 oz) has 23 mg.  2% milk -- 1 cup (8 oz) has 18 mg.  Cream cheese -- 1 tablespoon (Tbsp) has 15 mg.  Cottage cheese --  cup (4 oz) has 14 mg.  Low-fat (1%) milk -- 1 cup (8 oz) has 10 mg.  Sour cream -- 1 Tbsp has 8.5 mg.  Low-fat yogurt -- 1 cup (8 oz) has 8 mg.  Nonfat  Greek yogurt -- 1 cup (8 oz) has 7 mg.  Half-and-half cream -- 1 Tbsp has 5 mg. Fats and oils  Cod liver oil -- 1 tablespoon (Tbsp) has 82 mg.  Butter -- 1 Tbsp has 15 mg.  Lard -- 1 Tbsp has 14 mg.  Bacon grease -- 1 Tbsp has 14 mg.  Mayonnaise -- 1 Tbsp has 5-10 mg.  Margarine -- 1 Tbsp has 3-10 mg. Exact amounts of cholesterol in these foods may vary depending on specific ingredients and brands. Foods without cholesterol Most plant-based foods do not have cholesterol unless you combine them with a food that has cholesterol. Foods without cholesterol include:  Grains and cereals.  Vegetables.  Fruits.  Vegetable oils, such as olive, canola, and sunflower oil.  Legumes, such as peas, beans, and lentils.  Nuts and seeds.  Egg whites. Summary  The body needs cholesterol in small amounts, but too much cholesterol can cause damage to the arteries and heart.  Most people should eat less than 200 milligrams (mg) of cholesterol a Marco [name]. This information is not intended to replace  advice given to you by your health care provider. Make sure you discuss any questions you have with your health care provider. Document Revised: 04/03/2017 Document Reviewed: 12/16/2016 Elsevier Patient Education  [place].

## 2019-11-16 NOTE — Chronic Care Management (AMB) (Signed)
Chronic Care Management Pharmacy  Name: Marco [name]  MRN: [id number] DOB: 1933/12/16  Chief Complaint/ HPI  PTWSFKCLE Marco [name],  86 y.o. , male presents for their Initial CCM visit with the clinical pharmacist via telephone due to COVID-19 Pandemic.  PCP : Colon Branch, MD  Their chronic conditions include: Diabetes, Hyperlipidemia, Hypothyroidism, Osteoporosis  Office Visits: 11/01/19: Visit w/ Dr. Larose Kells - Labs abnormal when previously controlled. Question compliance. TSH and FLP in 6 weeks. No med changes noted.   06/03/19: Medicare Annual Wellness Exam w/ Naaman Plummer, RN - Goal updated to maintain healthy lifestyle.  Consult Visit: 11/02/19: Ortho visit w/ Dr. Marlou Sa - Bilateral knee injections  10/12/19: Ortho visit w/ Dr. Marlou Sa - Hip fracture 06/2019 in [place]. Pt has healed from injury. Dx of osteoarthritis of knees. Bilateral knee injections.   Medications: Outpatient Encounter Medications as of 11/16/2019  Medication Sig Note   aspirin EC 81 MG EC tablet Take 1 tablet (81 mg total) by mouth daily.    atorvastatin (LIPITOR) 40 MG tablet Take 1 tablet (40 mg total) by mouth at bedtime.    ezetimibe (ZETIA) 10 MG tablet TAKE 1 TABLET EVERY Vedika Dumlao (NEED MD APPOINTMENT)    levothyroxine (SYNTHROID) 75 MCG tablet Take 1 tablet (75 mcg total) by mouth daily before breakfast.    metFORMIN (GLUCOPHAGE) 500 MG tablet Take 2 tablets (1,000 mg total) by mouth 2 (two) times daily with a meal. 11/16/2019: Reports he sometimes takes once daily vs twice daily   sitaGLIPtin (JANUVIA) 100 MG tablet Take 1 tablet (100 mg total) by mouth daily.    ACCU-CHEK AVIVA PLUS test strip CHECK BLOOD SUGAR NO MORE THAN TWICE DAILY    ACCU-CHEK SOFTCLIX LANCETS lancets CHECK BLOOD SUGAR NO MORE THAN TWICE DAILY.    Blood Glucose Calibration (ACCU-CHEK AVIVA) SOLN USE AS DIRECTED TO CALIBRATE GLUCOSE MACHINE AS NEEDED    diclofenac sodium (VOLTAREN) 1 % GEL Apply 2 g topically 4 (four) times daily.  (Patient not taking: Reported on 11/16/2019)    nitroGLYCERIN (NITROSTAT) 0.4 MG SL tablet Place 1 tablet (0.4 mg total) under the tongue every 5 (five) minutes x 3 doses as needed for chest pain. (Patient not taking: Reported on 06/03/2019) 11/16/2019: Patient does not have this available. Never had to use   No facility-administered encounter medications on file as of 11/16/2019.   SDOH Screenings   Alcohol Screen:    Last Alcohol Screening Score (AUDIT):   Depression (PHQ2-9): Low Risk    PHQ-2 Score: 0  Financial Resource Strain: Low Risk    Difficulty of Paying Living Expenses: Not hard at all  Food Insecurity:    Worried About Charity fundraiser in the Last Year:    Arboriculturist in the Last Year:   Housing:    Last Housing Risk Score:   Physical Activity:    Days of Exercise per Week:    Minutes of Exercise per Session:   Social Connections:    Frequency of Communication with Friends and Family:    Frequency of Social Gatherings with Friends and Family:    Attends Religious Services:    Active Member of Clubs or Organizations:    Attends Music therapist:    Marital Status:   Stress:    Feeling of Stress :   Tobacco Use: Low Risk    Smoking Tobacco Use: Never Smoker   Smokeless Tobacco Use: Never Used  Transportation Needs:    Lack  of Transportation (Medical):    Lack of Transportation (Non-Medical):      Current Diagnosis/Assessment:  Goals Addressed            This Visit's Progress    Chronic Care Management Pharmacy Care Plan       CARE PLAN ENTRY (see longitudinal plan of care for additional care plan information)  Current Barriers:   Chronic Disease Management support, education, and care coordination needs related to Diabetes, Hyperlipidemia, Hypothyroidism, Osteoporosis   Hyperlipidemia Lab Results  Component Value Date/Time   LDLCALC 125 (H) 11/01/2019 09:46 AM    Pharmacist Clinical Goal(s): o Over the next  90 days, patient will work with PharmD and providers to achieve LDL goal < 100  Current regimen:   Atorvastatin 37m daily   Ezetimibe 127mdaily  Interventions: o Emphasized the importance of med adherence o Discussed a1c goal  Patient self care activities - Over the next 90 days, patient will: o Maintain cholesterol medication regimen.   Diabetes Lab Results  Component Value Date/Time   HGBA1C 7.6 (H) 11/01/2019 09:46 AM   HGBA1C 7.0 (H) 04/18/2019 08:37 AM    Pharmacist Clinical Goal(s): o Over the next 90 days, patient will work with PharmD and providers to achieve A1c goal <7%  Current regimen:   Metformin 50073m2 tabs twice daily  Januvia 100m69mily  Interventions: o Emphasized the importance of med adherence o Discussed LDL goal  Patient self care activities - Over the next 90 days, patient will: o Check blood sugar once daily, document, and provide at future appointments o Contact provider with any episodes of hypoglycemia o Maintain diabetes medication regimen.   Hypothyroidism  Pharmacist Clinical Goal(s) o Over the next 90 days, patient will work with PharmD and providers to maintain TSH within normal limits  Current regimen:  o Levothyroxine 75mc78mily  Interventions: o Emphasized the importance of med adherence  Patient self care activities - Over the next 90 days, patient will: o Maintain hypothyroid medication regimen.   Osteoporosis  Pharmacist Clinical Goal(s) o Over the next 90 days, patient will work with PharmD and providers to reduce risk of fracture due to osteoporosis   Current regimen:  o Calcium 500mg 10my o Vitamin D 5000 units daily   Interventions: o -Consider intake of 1200mg o38mlcium daily through diet and/or supplementation o -Consider intake of [phone] units of vitamin D through supplementation  o Discussed importance of keeping appt for DEXA Scan tomorrow 11/17/19 o Discussed possibility of bisphosphonate  therapy pending Dr. Paz appLarose Kellsal  Patient self care activities - Over the next 90 days, patient will: o -Consider intake of 1200mg of64mcium daily through diet and/or supplementation o -Consider intake of [phone] units of vitamin D through supplementation  o Complete DEXA Scan on 11/17/19   Medication management  Pharmacist Clinical Goal(s): o Over the next 90 days, patient will work with PharmD and providers to achieve optimal medication adherence  [facility] o Comprehensive medication review performed. o Continue current medication management strategy  Patient self care activities - Over the next 90 days, patient will: o Focus on medication adherence by filling and taking medications appropriately  o Take medications as prescribed o Report any questions or concerns to PharmD and/or provider(s)  Initial goal documentation       Social Hx:  Lives with son. Retired. He has 3 children. 6 grandchildren His daughter in law works as a pharmacyEducation administratortes  A1c goal <7%  Recent Relevant Labs: Lab Results  Component Value Date/Time   HGBA1C 7.6 (H) 11/01/2019 09:46 AM   HGBA1C 7.0 (H) 04/18/2019 08:37 AM   MICROALBUR 2.5 (H) 11/01/2019 09:46 AM   MICROALBUR 2.7 (H) 10/15/2018 09:31 AM     Checking BG: 3-4 times per week  Recent FBG Readings: 120s per patient Patient has failed these meds in past: None noted  Patient is currently uncontrolled on the following medications:   Metformin 525m #2 tabs twice daily  Januvia 1012mdaily  Reports he takes metformin sometimes once daily and sometimes twice daily. Admits he does not take his Januvia regularly.  "I don't think I have to take all these things"  Last diabetic Foot exam:  Lab Results  Component Value Date/Time   HMDIABEYEEXA No Retinopathy 01/04/2019 12:00 AM    Last diabetic Eye exam:  Lab Results  Component Value Date/Time   HMDIABFOOTEX Normal/c/o  feet pain 03/08/2015 12:00 AM    Emphasized the importance of taking metformin twice daily and januvia daily noting his elevated a1c.    We discussed: A1c goal and importance of med adherence  Plan -Continue current medications (take both medications daily)   Hyperlipidemia   LDL goal <100  Lipid Panel     Component Value Date/Time   CHOL 195 11/01/2019 0946   TRIG 94.0 11/01/2019 0946   HDL 51.40 11/01/2019 0946   LDLCALC 125 (H) 11/01/2019 0946    Hepatic Function Latest Ref Rng & Units 11/01/2019 10/15/2018 10/01/2017  Total Protein 6.0 - 8.3 g/dL 7.3 7.1 -  Albumin 3.5 - 5.2 g/dL 4.2 4.2 -  AST 0 - 37 U/L '31 24 24  ' ALT 0 - 53 U/L '22 14 16  ' Alk Phosphatase 39 - 117 U/L 104 67 -  Total Bilirubin 0.2 - 1.2 mg/dL 0.7 0.6 -     The ASCVD Risk score (GMikey BussingC Jr., et al., 2013) failed to calculate for the following reasons:   The 2013 ASCVD risk score is only valid for ages 4012o 7924 Patient has failed these meds in past: pravastatin (efficacy?) Patient is currently uncontrolled on the following medications:   Atorvastatin 4046maily   Ezetimibe 54m52mily  We discussed:  LDL goal and importance of med adherence  Plan -Continue current medications (take both medications daily)  Hypothyroidism   Lab Results  Component Value Date/Time   TSH 10.45 (H) 11/01/2019 09:46 AM   TSH 4.50 04/18/2019 08:37 AM   FREET4 0.72 10/01/2017 03:59 PM    Patient has failed these meds in past: None noted  Patient is currently uncontrolled on the following medications:   Levothyroxine 75mc35mily  Reports he does not skip this medicine.  Admits that he did not take [place]othyrox[place] when [place] 09[place]  We discussed:  Importance of med adherence  Plan -Continue current medications  Osteoporosis/Hx of Hip Fracture   Last DEXA Scan: 09/11/2013 (Repeat scheduled tomorrow, 11/17/19)  T-Score femoral neck: R -1.9; L -1.6  No results  found for: VD25OH   Patient is a candidate for pharmacologic treatment due to history of hip fracture   Patient has failed these meds in past: None noted  Patient is currently uncontrolled on the following medications:   None  Fell in [place] resulted in [place] breaking his hip.  Emphasized the importance of making his appt for bone density  scan tomorrow. Calcium 553m  Vitamin D 500 units (states 500, but wonder if he means 5,000?)  We discussed:  Recommend [phone] units of vitamin D daily. Recommend 1200 mg of calcium daily from dietary and supplemental sources.  Plan -Consider taking calcium twice daily  -Ensure intake of [phone] units of vitamin D daily -Keep appt for DEXA Scan tomorrow  -Consider starting bisphosphonate therapy pending Dr. PLarose Kellsapproval    Miscellaneous Meds  Diclofenac gel (not using due to not having much pain)

## 2019-11-17 ENCOUNTER — Ambulatory Visit (INDEPENDENT_AMBULATORY_CARE_PROVIDER_SITE_OTHER)
Admission: RE | Admit: 2019-11-17 | Discharge: 2019-11-17 | Disposition: A | Discharge status: Home / Self Care | Payer: Medicare HMO | Source: Ambulatory Visit | Attending: Internal Medicine | Admitting: Internal Medicine

## 2019-11-17 DIAGNOSIS — S72001S Fracture of unspecified part of neck of right femur, sequela: Secondary | ICD-10-CM

## 2019-11-17 DIAGNOSIS — R6889 Other general symptoms and signs: Secondary | ICD-10-CM | POA: Diagnosis not present

## 2019-11-17 DIAGNOSIS — M810 Age-related osteoporosis without current pathological fracture: Secondary | ICD-10-CM | POA: Diagnosis not present

## 2019-11-21 ENCOUNTER — Other Ambulatory Visit: Payer: Self-pay | Admitting: Cardiovascular Disease

## 2019-11-24 ENCOUNTER — Telehealth: Payer: Self-pay

## 2019-11-24 NOTE — Telephone Encounter (Signed)
Reggie w/[facility] called and stated Med refill was faxed over on 7/15 for diabetic supplies for pt:  True metrix meter, true metrix touch strips, trueplus 33 gauge lancets, pt was also looking for BD alcohol swabs & true metrix control solution.  CB [phone].  Anyone that answers can help.  He is calling to f/up on med refill request.

## 2019-11-25 MED ORDER — LANCETS 33G MISC: 12 refills | Status: DC

## 2019-11-25 MED ORDER — TRUE METRIX METER W/DEVICE KIT: PACK | 0 refills | Status: DC

## 2019-11-25 MED ORDER — GLUCOSE BLOOD VI STRP: ORAL_STRIP | 12 refills | Status: DC

## 2019-11-25 MED ORDER — TRUE METRIX LEVEL 2 NORMAL VI SOLN: 2 refills | Status: DC

## 2019-11-25 MED ORDER — ALCOHOL SWABS PADS: MEDICATED_PAD | 1 refills | Status: DC

## 2019-11-25 NOTE — Telephone Encounter (Signed)
Rxs sent

## 2019-11-29 ENCOUNTER — Encounter: Payer: Self-pay | Admitting: Internal Medicine

## 2019-11-29 ENCOUNTER — Ambulatory Visit (INDEPENDENT_AMBULATORY_CARE_PROVIDER_SITE_OTHER): Payer: Medicare HMO | Admitting: Internal Medicine

## 2019-11-29 ENCOUNTER — Other Ambulatory Visit: Payer: Self-pay

## 2019-11-29 VITALS — BP 109/70 | HR 96 | Temp 98.4°F | Resp 18 | Ht 69.0 in | Wt 142.1 lb

## 2019-11-29 DIAGNOSIS — E039 Hypothyroidism, unspecified: Secondary | ICD-10-CM

## 2019-11-29 DIAGNOSIS — E785 Hyperlipidemia, unspecified: Secondary | ICD-10-CM

## 2019-11-29 DIAGNOSIS — M810 Age-related osteoporosis without current pathological fracture: Secondary | ICD-10-CM

## 2019-11-29 MED ORDER — ALENDRONATE SODIUM 70 MG PO TABS: 70.0000 mg | ORAL_TABLET | ORAL | 3 refills | Status: DC

## 2019-11-29 MED ORDER — EZETIMIBE 10 MG PO TABS: 10.0000 mg | ORAL_TABLET | Freq: Every day | ORAL | 3 refills | Status: DC

## 2019-11-29 NOTE — Progress Notes (Signed)
Subjective:    Patient ID: Marco [name], male    DOB: [birth year], 86 y.o.   MRN: [id number]  DOS:  11/29/2019 Type of visit - description: Follow-up Today we discussed osteoporosis, high cholesterol and thyroid disease In general feels well    Review of Systems See above   Past Medical History:  Diagnosis Date  . Amaurosis fugax 2017  . Diabetes mellitus   . DJD (degenerative joint disease) 08/25/2013  . Elevated PSA   . Other and unspecified hyperlipidemia 04/25/2013    Past Surgical History:  Procedure Laterality Date  . CATARACT EXTRACTION     Bilaterally  . TYMPANIC MEMBRANE REPAIR     R    Allergies as of 11/29/2019   No Known Allergies     Medication List       Accurate as of November 29, 2019 11:59 PM. If you have any questions, ask your nurse or doctor.        Alcohol Swabs Pads Use to clean finger prior to testing blood sugars   alendronate 70 MG tablet Commonly known as: FOSAMAX Take 1 tablet (70 mg total) by mouth once a week. Take with a full glass of water on an empty stomach. Started by: [name], MD   aspirin 81 MG EC tablet Take 1 tablet (81 mg total) by mouth daily.   atorvastatin 40 MG tablet Commonly known as: LIPITOR Take 1 tablet (40 mg total) by mouth at bedtime.   diclofenac sodium 1 % Gel Commonly known as: VOLTAREN Apply 2 g topically 4 (four) times daily.   ezetimibe 10 MG tablet Commonly known as: ZETIA Take 1 tablet (10 mg total) by mouth daily. What changed: See the new instructions. Changed by: [name], MD   glucose blood test strip Check blood sugars once daily   Lancets 33G Misc Check blood sugars once daily   levothyroxine 75 MCG tablet Commonly known as: SYNTHROID Take 1 tablet (75 mcg total) by mouth daily before breakfast.   metFORMIN 500 MG tablet Commonly known as: GLUCOPHAGE Take 2 tablets (1,000 mg total) by mouth 2 (two) times daily with a meal.   nitroGLYCERIN 0.4 MG SL tablet Commonly known  as: NITROSTAT Place 1 tablet (0.4 mg total) under the tongue every 5 (five) minutes x 3 doses as needed for chest pain.   sitaGLIPtin 100 MG tablet Commonly known as: Januvia Take 1 tablet (100 mg total) by mouth daily.   True Metrix Level 2 Normal Soln Calibration for glucometer   True Metrix Meter w/Device Kit Check blood sugars once daily          Objective:   Physical Exam BP 109/70 (BP Location: Left Arm, Patient Position: Sitting, Cuff Size: Small)   Pulse 96   Temp 98.4 F (36.9 C) (Oral)   Resp 18   Ht 5' 9" (1.753 m)   Wt 142 lb 2 oz (64.5 kg)   SpO2 98%   BMI 20.99 kg/m  General:   Well developed, NAD, BMI noted. HEENT:  Normocephalic . Face symmetric, atraumatic Skin: Not pale. Not jaundice Neurologic:  alert & oriented X3.  Speech normal, gait appropriate for age and assisted by a cane Psych--  Cognition and judgment appear intact.  Cooperative with normal attention span and concentration.  Behavior appropriate. No anxious or depressed appearing.      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  This was explained to the patient, benefits of alendronate and  how to take it discussed.  He is agreeable.  Rx sent. Dyslipidemia: Recent LDL increase, reports good med compliance, request RF on Zetia.  He thinks increasing LDL related to his diet.   Thyroid disease: Recent TSH elevated, he again reports good compliance,  recheck in few weeks.      This visit occurred during the SARS-CoV-2 public health emergency.  Safety protocols were in place, including screening questions prior to the visit, additional usage of staff PPE, and extensive cleaning of exam room while observing appropriate contact time as indicated for disinfecting solutions.

## 2019-11-29 NOTE — Patient Instructions (Signed)
I sent a medication called Fosamax (alendronate) to your pharmacy.  Take it only once a week, on an empty stomach.  After you take the tablet with 2 glasses of water, do not go back to bed or eat for 30 to 45 minutes.  Take vitamin D 2000 units daily  Take calcium 1 g daily  Alendronate tablets What is this medicine? ALENDRONATE (a LEN droe nate) slows calcium loss from bones. It helps to make normal healthy bone and to slow bone loss in people with Paget's disease and osteoporosis. It may be used in others at risk for bone loss. This medicine may be used for other purposes; ask your health care provider or pharmacist if you have questions. COMMON BRAND NAME(S): Fosamax What should I tell my health care provider before I take this medicine? They need to know if you have any of these conditions:  dental disease  esophagus, stomach, or intestine problems, like acid reflux or GERD  kidney disease  low blood calcium  low vitamin D  problems sitting or standing 30 minutes  trouble swallowing  an unusual or allergic reaction to alendronate, other medicines, foods, dyes, or preservatives  pregnant or trying to get pregnant  breast-feeding How should I use this medicine? You must take this medicine exactly as directed or you will lower the amount of the medicine you absorb into your body or you may cause yourself harm. Take this medicine by mouth first thing in the morning, after you are up for the day. Do not eat or drink anything before you take your medicine. Swallow the tablet with a full glass (6 to 8 fluid ounces) of plain water. Do not take this medicine with any other drink. Do not chew or crush the tablet. After taking this medicine, do not eat breakfast, drink, or take any medicines or vitamins for at least 30 minutes. Sit or stand up for at least 30 minutes after you take this medicine; do not lie down. Do not take your medicine more often than directed. Talk to your  pediatrician regarding the use of this medicine in children. Special care may be needed. Overdosage: If you think you have taken too much of this medicine contact a poison control center or emergency room at once. NOTE: This medicine is only for you. Do not share this medicine with others. What if I miss a dose? If you miss a dose, do not take it later in the day. Continue your normal schedule starting the next morning. Do not take double or extra doses. What may interact with this medicine?  aluminum hydroxide  antacids  aspirin  calcium supplements  drugs for inflammation like ibuprofen, naproxen, and others  iron supplements  magnesium supplements  vitamins with minerals This list may not describe all possible interactions. Give your health care provider a list of all the medicines, herbs, non-prescription drugs, or dietary supplements you use. Also tell them if you smoke, drink alcohol, or use illegal drugs. Some items may interact with your medicine. What should I watch for while using this medicine? Visit your doctor or health care professional for regular checks ups. It may be some time before you see benefit from this medicine. Do not stop taking your medicine except on your doctor's advice. Your doctor or health care professional may order blood tests and other tests to see how you are doing. You should make sure you get enough calcium and vitamin D while you are taking this medicine, unless your  doctor tells you not to. Discuss the foods you eat and the vitamins you take with your health care professional. Some people who take this medicine have severe bone, joint, and/or muscle pain. This medicine may also increase your risk for a broken thigh bone. Tell your doctor right away if you have pain in your upper leg or groin. Tell your doctor if you have any pain that does not go away or that gets worse. This medicine can make you more sensitive to the sun. If you get a rash while  taking this medicine, sunlight may cause the rash to get worse. Keep out of the sun. If you cannot avoid being in the sun, wear protective clothing and use sunscreen. Do not use sun lamps or tanning beds/booths. What side effects may I notice from receiving this medicine? Side effects that you should report to your doctor or health care professional as soon as possible:  allergic reactions like skin rash, itching or hives, swelling of the face, lips, or tongue  black or tarry stools  bone, muscle or joint pain  changes in vision  chest pain  heartburn or stomach pain  jaw pain, especially after dental work  pain or trouble when swallowing  redness, blistering, peeling or loosening of the skin, including inside the mouth Side effects that usually do not require medical attention (report to your doctor or health care professional if they continue or are bothersome):  changes in taste  diarrhea or constipation  eye pain or itching  headache  nausea or vomiting  stomach gas or fullness This list may not describe all possible side effects. Call your doctor for medical advice about side effects. You may report side effects to FDA at 1-800-FDA-1088. Where should I keep my medicine? Keep out of the reach of children. Store at room temperature of 15 and 30 degrees C (59 and 86 degrees F). Throw away any unused medicine after the expiration date. NOTE: This sheet is a summary. It may not cover all possible information. If you have questions about this medicine, talk to your doctor, pharmacist, or health care provider.  2020 Elsevier/Gold Standard (2010-10-18 08:56:09)

## 2019-11-29 NOTE — Progress Notes (Signed)
Pre visit review using our clinic review tool, if applicable. No additional management support is needed unless otherwise documented below in the visit note. 

## 2019-11-30 NOTE — Assessment & Plan Note (Signed)
Osteoporosis: Recent T score was-1.5, history of hip fracture, DX  osteoporosis.  This was explained to the patient, benefits of alendronate and  how to take it discussed.  He is agreeable.  Rx sent. Dyslipidemia: Recent LDL increase, reports good med compliance, request RF on Zetia.  He thinks increasing LDL related to his diet.   Thyroid disease: Recent TSH elevated, he again reports good compliance, recheck in few weeks.

## 2019-12-14 ENCOUNTER — Other Ambulatory Visit: Payer: Self-pay | Admitting: *Deleted

## 2019-12-14 MED ORDER — TRUEPLUS LANCETS 33G MISC: 1 refills | Status: DC

## 2019-12-14 MED ORDER — TRUE METRIX LEVEL 1 LOW VI SOLN: 0 refills | Status: DC

## 2019-12-14 MED ORDER — ALCOHOL SWABS PADS: MEDICATED_PAD | 1 refills | Status: DC

## 2019-12-16 ENCOUNTER — Other Ambulatory Visit: Payer: Self-pay

## 2019-12-16 ENCOUNTER — Other Ambulatory Visit (INDEPENDENT_AMBULATORY_CARE_PROVIDER_SITE_OTHER): Payer: Medicare HMO

## 2019-12-16 DIAGNOSIS — E039 Hypothyroidism, unspecified: Secondary | ICD-10-CM | POA: Diagnosis not present

## 2019-12-16 DIAGNOSIS — E785 Hyperlipidemia, unspecified: Secondary | ICD-10-CM | POA: Diagnosis not present

## 2019-12-16 LAB — LIPID PANEL
Cholesterol: 105 mg/dL (ref 0–200)
HDL: 48.9 mg/dL (ref >39.00)
LDL Cholesterol: 43 mg/dL (ref 0–99)
NonHDL: 56.39
Total CHOL/HDL Ratio: 2
Triglycerides: 66 mg/dL (ref 0.0–149.0)
VLDL: 13.2 mg/dL (ref 0.0–40.0)

## 2019-12-16 LAB — TSH: TSH: 1.08 u[IU]/mL (ref 0.35–4.50)

## 2019-12-21 ENCOUNTER — Telehealth: Payer: Medicare HMO

## 2020-01-20 ENCOUNTER — Telehealth: Payer: Self-pay | Admitting: Pharmacist

## 2020-01-20 NOTE — Progress Notes (Addendum)
Chronic Care Management Pharmacy Assistant   Name: Marco [name]  MRN: [id number] DOB: 1933/06/14  Reason for Encounter: Disease State  Patient Questions:  1.  Have you seen any other providers since your last visit? Yes  2.  Any changes in your medicines or health? Yes  PCP : Colon Branch, MD   Their chronic conditions include: Diabetes, Hyperlipidemia, Hypothyroidism, Osteoporosis.  Office Visits: 11-29-2019 (PCP) Patient presented in the office as a f/u. Patient reports feeling well in general. DM assessment at the time of the visit indicates numbness without pain. Patient's recent T score was -1.5 and has a hx of hip fracture, therefore, there were medication changes made. Fosamax; take the tablet with 2 glasses of water, do not go back to bed or eat for 30 to 45 minutes.  Allergies:  No Known Allergies  Medications: Outpatient Encounter Medications as of 01/20/2020  Medication Sig Note  . Alcohol Swabs PADS Use to clean finger prior to testing blood sugars   . alendronate (FOSAMAX) 70 MG tablet Take 1 tablet (70 mg total) by mouth once a week. Take with a full glass of water on an empty stomach.   Marland Kitchen aspirin EC 81 MG EC tablet Take 1 tablet (81 mg total) by mouth daily.   Marland Kitchen atorvastatin (LIPITOR) 40 MG tablet Take 1 tablet (40 mg total) by mouth at bedtime.   . Blood Glucose Calibration (TRUE METRIX LEVEL 1) Low SOLN Use as directed   . Blood Glucose Monitoring Suppl (TRUE METRIX METER) w/Device KIT Check blood sugars once daily   . diclofenac sodium (VOLTAREN) 1 % GEL Apply 2 g topically 4 (four) times daily. (Patient not taking: Reported on 11/16/2019)   . ezetimibe (ZETIA) 10 MG tablet Take 1 tablet (10 mg total) by mouth daily.   Marland Kitchen glucose blood test strip Check blood sugars once daily   . levothyroxine (SYNTHROID) 75 MCG tablet Take 1 tablet (75 mcg total) by mouth daily before breakfast.   . metFORMIN (GLUCOPHAGE) 500 MG tablet Take 2 tablets (1,000 mg total) by mouth 2  (two) times daily with a meal. 11/16/2019: Reports he sometimes takes once daily vs twice daily  . nitroGLYCERIN (NITROSTAT) 0.4 MG SL tablet Place 1 tablet (0.4 mg total) under the tongue every 5 (five) minutes x 3 doses as needed for chest pain. (Patient not taking: Reported on 06/03/2019)   . sitaGLIPtin (JANUVIA) 100 MG tablet Take 1 tablet (100 mg total) by mouth daily.   . TRUEplus Lancets 33G MISC Use to check blood sugar once a day.  Dx code: E11.9    No facility-administered encounter medications on file as of 01/20/2020.    Current Diagnosis: Patient Active Problem List   Diagnosis Date Noted  . Hypothyroidism 12/02/2017  . Chronic pain of right knee 10/13/2017  . Chest pain 11/19/2016  . 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 PCP NOTES >>> 03/08/2015  . Osteoporosis 09/15/2014  . Primary osteoarthritis of both knees 03/16/2014  . Hearing difficulty 10/20/2013  . DJD (degenerative joint disease) 08/25/2013  . Neck mass 04/25/2013  . Hyperlipidemia 04/25/2013  . Erectile dysfunction 12/26/2011  . Annual physical exam 08/22/2011  . Prostate nodule 08/22/2011  . Diabetes mellitus ([place]) 05/07/2011    Goals Addressed   None    Recent Relevant Labs: Lab Results  Component Value Date/Time   HGBA1C 7.6 (H) 11/01/2019 09:46 AM   HGBA1C 7.0 (H) 04/18/2019 08:37 AM   MICROALBUR 2.5 (H) 11/01/2019 09:46 AM  MICROALBUR 2.7 (H) 10/15/2018 09:31 AM    Kidney Function Lab Results  Component Value Date/Time   CREATININE 1.21 11/01/2019 09:46 AM   CREATININE 1.36 04/18/2019 08:37 AM   GFR 57.07 (L) 11/01/2019 09:46 AM   GFRNONAA >60 11/21/2016 04:07 AM   GFRAA >60 11/21/2016 04:07 AM    . Current antihyperglycemic regimen:   Metformin 513m #2 tabs twice daily o Januvia 1073mdaily  . What recent interventions/DTPs have been made to improve glycemic control:  o None at this time.  . Have there been any recent hospitalizations or ED visits since last visit with CPP? No    . Patient denies hypoglycemic symptoms, including None   . Patient denies hyperglycemic symptoms, including polyuria.   . How often are you checking your blood sugar? Patient states he checks he checks his BS every other day . What are your blood sugars ranging?  o Fasting: 110-125  . During the week, how often does your blood glucose drop below 70? Never . Are you checking your feet daily/regularly? Patient states he is checking his feet regularly and there are no cuts, bruises, or blisters. He did cut his finger two days ago while preparing food.  Adherence Review: Is the patient currently on a STATIN medication? Yes Atorvastatin 4026ms the patient currently on ACE/ARB medication? No Does the patient have >5 day gap between last estimated fill dates? No   Patient states he has been tolerating the Fosamax without any side effects. He is requesting a walking cane due to right hip fracture. He is not walking stable. He would like to know if he is able to get a prescription for this and the contact information to appropriate DME supplier.  Follow-Up:  Pharmacist Review and Scheduled Follow-Up With Clinical Pharmacist on Friday February 03, 2020 at 10:00 am over the telephone.   IveFanny SkatesMAPort Angelesarmacist Assistant [phone] by: KanDe BlanchharmD Clinical Pharmacist LeBDenverimary [facility] at [phone]

## 2020-01-21 ENCOUNTER — Other Ambulatory Visit: Payer: Self-pay | Admitting: Internal Medicine

## 2020-01-25 NOTE — Progress Notes (Signed)
Ermalinda Barrios called patient on 01/25/20 to inform patient that he could receive cane OTC or see Dr. Larose Kells for visit if prescription needed for cane. Pt declines purchasing walking cane OTC. Advised that he contact Dr. Ethel Rana office for a visit if he prefers a prescription.

## 2020-02-03 ENCOUNTER — Telehealth: Payer: Medicare HMO

## 2020-02-03 NOTE — Chronic Care Management (AMB) (Deleted)
Chronic Care Management Pharmacy  Name: DUB MACLELLAN  MRN: [id number] DOB: 1934-01-17  Chief Complaint/ HPI  JKDTOIZTI Marco [name],  86 y.o. , male presents for their Follow-Up CCM visit with the clinical pharmacist via telephone due to COVID-19 Pandemic.  PCP : Colon Branch, MD  Their chronic conditions include: Diabetes, Hyperlipidemia, Hypothyroidism, Osteoporosis  Office Visits: 11/29/19: Visit w/ Dr. Larose Kells - Started alendronate due to T-Score -1.5 and hx of hip fracture. Pt reports increasing LDL cause is diet. TSH elevated, reports good adherence. Will recheck in a few weeks.   Consult Visit: None since last CCM visit on 11/16/2019.   Medications: Outpatient Encounter Medications as of 02/03/2020  Medication Sig Note  . Alcohol Swabs PADS Use to clean finger prior to testing blood sugars   . alendronate (FOSAMAX) 70 MG tablet Take 1 tablet (70 mg total) by mouth once a week. Take with a full glass of water on an empty stomach.   Marland Kitchen aspirin EC 81 MG EC tablet Take 1 tablet (81 mg total) by mouth daily.   Marland Kitchen atorvastatin (LIPITOR) 40 MG tablet Take 1 tablet (40 mg total) by mouth at bedtime.   . Blood Glucose Calibration (TRUE METRIX LEVEL 1) Low SOLN Use as directed   . Blood Glucose Monitoring Suppl (TRUE METRIX METER) w/Device KIT CHECK BLOOD SUGARS AS DIRECTED   . diclofenac sodium (VOLTAREN) 1 % GEL Apply 2 g topically 4 (four) times daily. (Patient not taking: Reported on 11/16/2019)   . ezetimibe (ZETIA) 10 MG tablet Take 1 tablet (10 mg total) by mouth daily.   Marland Kitchen glucose blood test strip Check blood sugars once daily   . levothyroxine (SYNTHROID) 75 MCG tablet Take 1 tablet (75 mcg total) by mouth daily before breakfast.   . metFORMIN (GLUCOPHAGE) 500 MG tablet Take 2 tablets (1,000 mg total) by mouth 2 (two) times daily with a meal. 11/16/2019: Reports he sometimes takes once daily vs twice daily  . nitroGLYCERIN (NITROSTAT) 0.4 MG SL tablet Place 1 tablet (0.4 mg total) under  the tongue every 5 (five) minutes x 3 doses as needed for chest pain. (Patient not taking: Reported on 06/03/2019)   . sitaGLIPtin (JANUVIA) 100 MG tablet Take 1 tablet (100 mg total) by mouth daily.   . TRUEplus Lancets 33G MISC Use to check blood sugar once a Nyeshia Mysliwiec.  Dx code: E11.9    No facility-administered encounter medications on file as of 02/03/2020.   SDOH Screenings   Alcohol Screen:   . Last Alcohol Screening Score (AUDIT): Not on file  Depression (PHQ2-9): Low Risk   . PHQ-2 Score: 0  Financial Resource Strain: Low Risk   . Difficulty of Paying Living Expenses: Not hard at all  Food Insecurity:   . Worried About Charity fundraiser in the Last Year: Not on file  . Ran Out of Food in the Last Year: Not on file  Housing:   . Last Housing Risk Score: Not on file  Physical Activity:   . Days of Exercise per Week: Not on file  . Minutes of Exercise per Session: Not on file  Social Connections:   . Frequency of Communication with Friends and Family: Not on file  . Frequency of Social Gatherings with Friends and Family: Not on file  . Attends Religious Services: Not on file  . Active Member of Clubs or Organizations: Not on file  . Attends Archivist Meetings: Not on file  . Marital Status:  Not on file  Stress:   . Feeling of Stress : Not on file  Tobacco Use: Low Risk   . Smoking Tobacco Use: Never Smoker  . Smokeless Tobacco Use: Never Used  Transportation Needs:   . Film/video editor (Medical): Not on file  . Lack of Transportation (Non-Medical): Not on file     Current Diagnosis/Assessment:  Goals Addressed   None    Social Hx:  Lives with son. Retired. He has 3 children. 6 grandchildren His daughter in law works as a Education administrator.  Diabetes   A1c goal <7%  Recent Relevant Labs: Lab Results  Component Value Date/Time   HGBA1C 7.6 (H) 11/01/2019 09:46 AM   HGBA1C 7.0 (H) 04/18/2019 08:37 AM   MICROALBUR 2.5 (H) 11/01/2019 09:46 AM     MICROALBUR 2.7 (H) 10/15/2018 09:31 AM     Checking BG: 3-4 times per week  Recent FBG Readings: 120s per patient Patient has failed these meds in past: None noted  Patient is currently uncontrolled on the following medications:   Metformin 544m #2 tabs twice daily  Januvia 108mdaily  Reports he takes metformin sometimes once daily and sometimes twice daily. Admits he does not take his Januvia regularly.  "I don't think I have to take all these things"  Last diabetic Foot exam:  Lab Results  Component Value Date/Time   HMDIABEYEEXA No Retinopathy 01/04/2019 12:00 AM    Last diabetic Eye exam:  Lab Results  Component Value Date/Time   HMDIABFOOTEX Normal/c/o feet pain 03/08/2015 12:00 AM    Emphasized the importance of taking metformin twice daily and januvia daily noting his elevated a1c.    We discussed: A1c goal and importance of med adherence  Plan -Continue current medications (take both medications daily)   Hyperlipidemia   LDL goal <100  Lipid Panel     Component Value Date/Time   CHOL 105 12/16/2019 0824   TRIG 66.0 12/16/2019 0824   HDL 48.90 12/16/2019 0824   LDLCALC 43 12/16/2019 0824    Hepatic Function Latest Ref Rng & Units 11/01/2019 10/15/2018 10/01/2017  Total Protein 6.0 - 8.3 g/dL 7.3 7.1 -  Albumin 3.5 - 5.2 g/dL 4.2 4.2 -  AST 0 - 37 U/L '31 24 24  ' ALT 0 - 53 U/L '22 14 16  ' Alk Phosphatase 39 - 117 U/L 104 67 -  Total Bilirubin 0.2 - 1.2 mg/dL 0.7 0.6 -     The ASCVD Risk score (GMikey BussingC Jr., et al., 2013) failed to calculate for the following reasons:   The 2013 ASCVD risk score is only valid for ages 4064o 7941 Patient has failed these meds in past: pravastatin (efficacy?) Patient is currently controlled on the following medications:  . Atorvastatin 4086maily  . Ezetimibe 70m81mily  We discussed:  LDL goal and importance of med adherence   Update 02/03/20 LDL improved from previous. Congratulated patient on  this.  Plan -Continue current medications (take both medications daily)  Hypothyroidism   Lab Results  Component Value Date/Time   TSH 1.08 12/16/2019 08:24 AM   TSH 10.45 (H) 11/01/2019 09:46 AM   FREET4 0.72 10/01/2017 03:59 PM    Patient has failed these meds in past: None noted  Patient is currently controlled on the following medications:  . Levothyroxine 75mc35mily  Reports he does not skip this medicine.  Admits that [place] since [place]24/19  We discussed:  Importance of med adherence   Update 02/03/2020 TSH improved. Congratulated patient on this  Plan -Continue current medications   Osteoporosis/Hx of Hip Fracture   Last DEXA Scan: 11/17/2019  T-Score femoral neck: -1.5 (L)  T-Score lumbar spine: 0.0  10-year probability of major osteoporotic fracture: 4.4%  10-year probability of hip fracture: 1.8%  No results found for: VD25OH   Patient is a candidate for pharmacologic treatment due to history of hip fracture   Patient has failed these meds in past: *** Patient is currently controlled on the following medications:  . Alendronate 65m weekly (started at 11/29/19 visit w/Paz)  Problem Story FGolden Circlein his room [place] This resulted in him breaking his hip.  Emphasized the importance of making his appt for bone density scan tomorrow. Calcium 504m Vitamin D 500 units (states 500, but wonder if he means 5,000?)  Update 02/03/20 Dr. PaLarose Kellsid start bisphosphonate therapy at 11/29/19 Visit post updated DEXA Scan  We discussed:  Recommend [phone] units of vitamin D daily. Recommend 1200 mg of calcium daily from dietary and supplemental sources. Counseled on oral bisphosphonate administration: take in the morning, 30 minutes prior to food with 6-8 oz of water. Do not lie down for at least 30 minutes after taking.  Plan -Continue current medications  Vaccines   Reviewed and  discussed patient's vaccination history.    Immunization History  Administered Date(s) Administered  . PFIZER SARS-COV-2 Vaccination 06/06/2019, 06/26/2019    Plan  Recommended patient receive *** vaccine in *** office.    Medication Management   Pt uses [facility] for all medications Uses pill box? {Yes or If no, why not?:20788} Pt endorses ***% compliance  Miscellaneous Meds  Diclofenac gel (not using due to not having much pain)  We discussed: Current pharmacy is preferred with insurance plan and patient is satisfied with pharmacy services  Plan  Continue current medication management strategy    Follow up: 3 month phone visit  KaDe BlanchPharmD Clinical Pharmacist [facility]verim[facility] at [phone]

## 2020-02-10 ENCOUNTER — Other Ambulatory Visit: Payer: Self-pay

## 2020-02-10 ENCOUNTER — Telehealth: Payer: Self-pay | Admitting: Internal Medicine

## 2020-02-10 ENCOUNTER — Telehealth: Payer: Self-pay

## 2020-02-10 MED ORDER — LEVOTHYROXINE SODIUM 75 MCG PO TABS: 75.0000 ug | ORAL_TABLET | Freq: Every day | ORAL | 1 refills | Status: DC

## 2020-02-10 NOTE — Telephone Encounter (Signed)
Pt called back- he had several questions regarding dose of levothyroxine- informed he should be taking levothyroxine 49mcg. Refills sent to [facility].

## 2020-02-10 NOTE — Telephone Encounter (Signed)
LMOM informing Pt to return call.  

## 2020-02-10 NOTE — Telephone Encounter (Signed)
Patient is requesting a call back.

## 2020-02-14 NOTE — Telephone Encounter (Signed)
Error

## 2020-02-23 DIAGNOSIS — H40023 Open angle with borderline findings, high risk, bilateral: Secondary | ICD-10-CM | POA: Diagnosis not present

## 2020-02-23 DIAGNOSIS — Z7984 Long term (current) use of oral hypoglycemic drugs: Secondary | ICD-10-CM | POA: Diagnosis not present

## 2020-02-23 DIAGNOSIS — H524 Presbyopia: Secondary | ICD-10-CM | POA: Diagnosis not present

## 2020-02-23 DIAGNOSIS — H40053 Ocular hypertension, bilateral: Secondary | ICD-10-CM | POA: Diagnosis not present

## 2020-02-23 DIAGNOSIS — H5213 Myopia, bilateral: Secondary | ICD-10-CM | POA: Diagnosis not present

## 2020-02-23 DIAGNOSIS — E119 Type 2 diabetes mellitus without complications: Secondary | ICD-10-CM | POA: Diagnosis not present

## 2020-02-23 DIAGNOSIS — H52203 Unspecified astigmatism, bilateral: Secondary | ICD-10-CM | POA: Diagnosis not present

## 2020-02-23 DIAGNOSIS — H43813 Vitreous degeneration, bilateral: Secondary | ICD-10-CM | POA: Diagnosis not present

## 2020-02-23 DIAGNOSIS — H43393 Other vitreous opacities, bilateral: Secondary | ICD-10-CM | POA: Diagnosis not present

## 2020-02-23 LAB — HM DIABETES EYE EXAM

## 2020-03-05 ENCOUNTER — Encounter: Payer: Self-pay | Admitting: Internal Medicine

## 2020-03-05 ENCOUNTER — Other Ambulatory Visit: Payer: Self-pay

## 2020-03-05 ENCOUNTER — Ambulatory Visit (INDEPENDENT_AMBULATORY_CARE_PROVIDER_SITE_OTHER): Payer: Medicare HMO | Admitting: Internal Medicine

## 2020-03-05 VITALS — BP 110/74 | HR 79 | Temp 97.8°F | Resp 18 | Ht 69.0 in | Wt 145.1 lb

## 2020-03-05 DIAGNOSIS — E039 Hypothyroidism, unspecified: Secondary | ICD-10-CM | POA: Diagnosis not present

## 2020-03-05 DIAGNOSIS — I2510 Atherosclerotic heart disease of native coronary artery without angina pectoris: Secondary | ICD-10-CM | POA: Diagnosis not present

## 2020-03-05 DIAGNOSIS — M810 Age-related osteoporosis without current pathological fracture: Secondary | ICD-10-CM | POA: Diagnosis not present

## 2020-03-05 DIAGNOSIS — E1169 Type 2 diabetes mellitus with other specified complication: Secondary | ICD-10-CM

## 2020-03-05 MED ORDER — ALENDRONATE SODIUM 70 MG PO TABS
70.0000 mg | ORAL_TABLET | ORAL | 3 refills | Status: DC
Start: 2020-03-05 — End: 2020-05-11

## 2020-03-05 NOTE — Patient Instructions (Signed)
Continue taking all your medications as recommended.  GO TO THE LAB : Get the blood work     GO TO THE FRONT DESK, PLEASE SCHEDULE YOUR APPOINTMENTS Come back for a physical exam in 6 months

## 2020-03-05 NOTE — Progress Notes (Signed)
Subjective:    Patient ID: Marco [name], male    DOB: 1933/11/27, 86 y.o.   MRN: [id number]  DOS:  03/05/2020 Type of visit - description:  f/u  Since the last office visit he is doing well. We talk about osteoporosis, thyroid disease, vaccinations.   Review of Systems No recent falls  Past Medical History:  Diagnosis Date  . Amaurosis fugax 2017  . Diabetes mellitus   . DJD (degenerative joint disease) 08/25/2013  . Elevated PSA   . Other and unspecified hyperlipidemia 04/25/2013    Past Surgical History:  Procedure Laterality Date  . CATARACT EXTRACTION     Bilaterally  . TYMPANIC MEMBRANE REPAIR     R    Allergies as of 03/05/2020   No Known Allergies     Medication List       Accurate as of March 05, 2020  9:29 AM. If you have any questions, ask your nurse or doctor.        Alcohol Swabs Pads Use to clean finger prior to testing blood sugars   alendronate 70 MG tablet Commonly known as: FOSAMAX Take 1 tablet (70 mg total) by mouth once a week. Take with a full glass of water on an empty stomach.   aspirin 81 MG EC tablet Take 1 tablet (81 mg total) by mouth daily.   atorvastatin 40 MG tablet Commonly known as: LIPITOR Take 1 tablet (40 mg total) by mouth at bedtime.   diclofenac sodium 1 % Gel Commonly known as: VOLTAREN Apply 2 g topically 4 (four) times daily.   ezetimibe 10 MG tablet Commonly known as: ZETIA Take 1 tablet (10 mg total) by mouth daily.   glucose blood test strip Check blood sugars once daily   levothyroxine 75 MCG tablet Commonly known as: SYNTHROID Take 1 tablet (75 mcg total) by mouth daily before breakfast.   metFORMIN 500 MG tablet Commonly known as: GLUCOPHAGE Take 2 tablets (1,000 mg total) by mouth 2 (two) times daily with a meal.   nitroGLYCERIN 0.4 MG SL tablet Commonly known as: NITROSTAT Place 1 tablet (0.4 mg total) under the tongue every 5 (five) minutes x 3 doses as needed for chest pain.    sitaGLIPtin 100 MG tablet Commonly known as: Januvia Take 1 tablet (100 mg total) by mouth daily.   True Metrix Level 1 Low Soln Use as directed   True Metrix Meter w/Device Kit CHECK BLOOD SUGARS AS DIRECTED   TRUEplus Lancets 33G Misc Use to check blood sugar once a day.  Dx code: E11.9          Objective:   Physical Exam BP 110/74 (BP Location: Left Arm, Patient Position: Sitting, Cuff Size: Small)   Pulse 79   Temp 97.8 F (36.6 C) (Oral)   Resp 18   Ht '5\' 9"'  (1.753 m)   Wt 145 lb 2 oz (65.8 kg)   SpO2 94%   BMI 21.43 kg/m  General:   Well developed, NAD, BMI noted. HEENT:  Normocephalic . Face symmetric, atraumatic Lungs:  CTA B Normal respiratory effort, no intercostal retractions, no accessory muscle use. Heart: RRR,  no murmur.  Lower extremities: no pretibial edema bilaterally  DM foot exam: No edema, good pulses, pinprick examination normal Neurologic:  alert & oriented X3.  Speech normal, gait observed, assisted by a cane, seems at baseline.  Transferring is slow and difficult, needed help. Psych--  Cognition and judgment appear intact.  Cooperative with normal attention span  and concentration.  Behavior appropriate. No anxious or depressed appearing.      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  Feet exam normal.  Check a BMP, A1c. Dyslipidemia: On Lipitor and Zetia, last FLP very  good. Thyroid disease: On Synthroid, reports good compliance, checking labs CAD: Had a stress test with indeterminate risk, continue aspirin. Osteoporosis: Reports good compliance with Fosamax without problems swallowing.  Rx sent for a year.  On calcium and vitamin D supplements. Preventive care: States he had a Covid booster plans to do a flu shot in few days. RTC 6 months   This visit occurred during the SARS-CoV-2 public health emergency.  Safety protocols were in place, including screening questions prior to the visit, additional usage of staff PPE, and extensive cleaning of exam room while observing appropriate contact time as indicated for disinfecting solutions.

## 2020-03-05 NOTE — Progress Notes (Signed)
Pre visit review using our clinic review tool, if applicable. No additional management support is needed unless otherwise documented below in the visit note. 

## 2020-03-06 DIAGNOSIS — I2510 Atherosclerotic heart disease of native coronary artery without angina pectoris: Secondary | ICD-10-CM | POA: Insufficient documentation

## 2020-03-06 LAB — BASIC METABOLIC PANEL
BUN/Creatinine Ratio: 13 (calc) (ref 6–22)
BUN: 17 mg/dL (ref 7–25)
CO2: 25 mmol/L (ref 20–32)
Calcium: 10.8 mg/dL — ABNORMAL HIGH (ref 8.6–10.3)
Chloride: 102 mmol/L (ref 98–110)
Creat: 1.36 mg/dL — ABNORMAL HIGH (ref 0.70–1.11)
Glucose, Bld: 126 mg/dL — ABNORMAL HIGH (ref 65–99)
Potassium: 5 mmol/L (ref 3.5–5.3)
Sodium: 139 mmol/L (ref 135–146)

## 2020-03-06 LAB — HEMOGLOBIN A1C
Hgb A1c MFr Bld: 6.9 % of total Hgb — ABNORMAL HIGH (ref <5.7)
Mean Plasma Glucose: 151 (calc)
eAG (mmol/L): 8.4 (calc)

## 2020-03-06 LAB — TSH: TSH: 2.21 mIU/L (ref 0.40–4.50)

## 2020-03-06 NOTE — Assessment & Plan Note (Signed)
DM: On Januvia, Metformin, last A1c 7.6, appropriate for age.  Feet exam normal.  Check a BMP, A1c. Dyslipidemia: On Lipitor and Zetia, last FLP very good. Thyroid disease: On Synthroid, reports good compliance, checking labs CAD: Had a stress test with indeterminate risk, continue aspirin. Osteoporosis: Reports good compliance with Fosamax without problems swallowing.  Rx sent for a year.  On calcium and vitamin D supplements. Preventive care: States he had a Covid booster plans to do a flu shot in few days. RTC 6 months

## 2020-03-14 ENCOUNTER — Telehealth: Payer: Self-pay | Admitting: Pharmacist

## 2020-03-14 NOTE — Progress Notes (Signed)
Chronic Care Management Pharmacy Assistant   Name: LENORRIS KARGER  MRN: [id number] DOB: 25-Sep-1933  Reason for Encounter: Disease State  Patient Questions:  1.  Have you seen any other providers since your last visit?Yes  2.  Any changes in your medicines or health? No     PCP : Colon Branch, MD   Their chronic conditions include: Diabetes, Hyperlipidemia, Hypothyroidism, Osteoporosis.   Office Visits: 03-05-2020 (PCP) Patient presented in the office for a follow up. Patient reported doing well since his last visit. No medication changes. Lab work ordered  Consults: 02-23-2020 (Ophthalmology) Patient presents for Diabetic Eye Exam. Exam results show high glaucoma risk in both eyes. The provider did discuss the importance of good A1C control and to get yearly eye exams    Allergies:  No Known Allergies  Medications: Outpatient Encounter Medications as of 03/14/2020  Medication Sig Note  . Alcohol Swabs PADS Use to clean finger prior to testing blood sugars   . alendronate (FOSAMAX) 70 MG tablet Take 1 tablet (70 mg total) by mouth once a week. Take with a full glass of water on an empty stomach.   Marland Kitchen aspirin EC 81 MG EC tablet Take 1 tablet (81 mg total) by mouth daily.   Marland Kitchen atorvastatin (LIPITOR) 40 MG tablet Take 1 tablet (40 mg total) by mouth at bedtime.   . Blood Glucose Calibration (TRUE METRIX LEVEL 1) Low SOLN Use as directed   . Blood Glucose Monitoring Suppl (TRUE METRIX METER) w/Device KIT CHECK BLOOD SUGARS AS DIRECTED   . diclofenac sodium (VOLTAREN) 1 % GEL Apply 2 g topically 4 (four) times daily. (Patient not taking: Reported on 11/16/2019)   . ezetimibe (ZETIA) 10 MG tablet Take 1 tablet (10 mg total) by mouth daily.   Marland Kitchen glucose blood test strip Check blood sugars once daily   . levothyroxine (SYNTHROID) 75 MCG tablet Take 1 tablet (75 mcg total) by mouth daily before breakfast. 03/05/2020: Wants to switch back to previous dose- he feels better when taking  that  . metFORMIN (GLUCOPHAGE) 500 MG tablet Take 2 tablets (1,000 mg total) by mouth 2 (two) times daily with a meal. 11/16/2019: Reports he sometimes takes once daily vs twice daily  . nitroGLYCERIN (NITROSTAT) 0.4 MG SL tablet Place 1 tablet (0.4 mg total) under the tongue every 5 (five) minutes x 3 doses as needed for chest pain. (Patient not taking: Reported on 06/03/2019) 03/05/2020: PRN  . sitaGLIPtin (JANUVIA) 100 MG tablet Take 1 tablet (100 mg total) by mouth daily.   . TRUEplus Lancets 33G MISC Use to check blood sugar once a day.  Dx code: E11.9    No facility-administered encounter medications on file as of 03/14/2020.    Current Diagnosis: Patient Active Problem List   Diagnosis Date Noted  . CAD (coronary artery disease) 03/06/2020  . Hypothyroidism 12/02/2017  . Chronic pain of right knee 10/13/2017  . Chest pain 11/19/2016  . 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 PCP NOTES >>> 03/08/2015  . Osteoporosis 09/15/2014  . Primary osteoarthritis of both knees 03/16/2014  . Hearing difficulty 10/20/2013  . DJD (degenerative joint disease) 08/25/2013  . Neck mass 04/25/2013  . Hyperlipidemia 04/25/2013  . Erectile dysfunction 12/26/2011  . Annual physical exam 08/22/2011  . Prostate nodule 08/22/2011  . Diabetes mellitus ([place]) 05/07/2011    Goals Addressed   None    Contacted the patient for a General Adherence call. Patient stated overall he was doing well and  did not have any urgent issues he would like to address. Inquired with the patient if he was able to get a cane. The last time we spoke he asked if his insurance would cover the cost of a walking cane for him due to his right knee pain. He stated he had to purchase the walking cane on his own because he did not have a prescription to provide to the insurance for coverage. Advised that he contact [employer] for further resources on reimbursement options.   Follow-Up:  Pharmacist Review and Scheduled Follow-Up With Clinical  Pharmacist Friday December 17th at 11:00 am over the telephone.  Fanny Skates, [place] Pharmacist Assistant [phone]

## 2020-04-03 ENCOUNTER — Telehealth: Payer: Self-pay | Admitting: Pharmacist

## 2020-04-03 NOTE — Progress Notes (Addendum)
error 

## 2020-04-20 ENCOUNTER — Ambulatory Visit: Payer: Medicare HMO | Admitting: Pharmacist

## 2020-04-20 DIAGNOSIS — E785 Hyperlipidemia, unspecified: Secondary | ICD-10-CM

## 2020-04-20 DIAGNOSIS — E1169 Type 2 diabetes mellitus with other specified complication: Secondary | ICD-10-CM

## 2020-04-20 DIAGNOSIS — E039 Hypothyroidism, unspecified: Secondary | ICD-10-CM

## 2020-04-20 DIAGNOSIS — M810 Age-related osteoporosis without current pathological fracture: Secondary | ICD-10-CM

## 2020-04-20 NOTE — Chronic Care Management (AMB) (Signed)
Chronic Care Management Pharmacy  Name: Marco [name]  MRN: [id number] DOB: Sep 07, 1933  Chief Complaint/ HPI  Marco [name],  86 y.o. , male presents for their Follow-Up CCM visit with the clinical pharmacist via telephone due to COVID-19 Pandemic.  PCP : Colon Branch, MD  Their chronic conditions include: Diabetes, Hyperlipidemia, Hypothyroidism, Osteoporosis  Office Visits: 03/05/20: Visit w/ Dr. Larose Kells - No med changes noted.   11/29/19: Visit w/ Dr. Larose Kells - Started alendronate 7m weekly.  Consult Visit: 02/23/20: Ophthalmology visit w/ Dr. TRex Kras- Diabetic Eye Exam: No evidence of retinopathy  Medications: Outpatient Encounter Medications as of 04/20/2020  Medication Sig Note  . Alcohol Swabs PADS Use to clean finger prior to testing blood sugars   . alendronate (FOSAMAX) 70 MG tablet Take 1 tablet (70 mg total) by mouth once a week. Take with a full glass of water on an empty stomach.   .Marland Kitchenaspirin EC 81 MG EC tablet Take 1 tablet (81 mg total) by mouth daily.   .Marland Kitchenatorvastatin (LIPITOR) 40 MG tablet Take 1 tablet (40 mg total) by mouth at bedtime.   . Blood Glucose Calibration (TRUE METRIX LEVEL 1) Low SOLN Use as directed   . Blood Glucose Monitoring Suppl (TRUE METRIX METER) w/Device KIT CHECK BLOOD SUGARS AS DIRECTED   . diclofenac sodium (VOLTAREN) 1 % GEL Apply 2 g topically 4 (four) times daily. (Patient not taking: Reported on 11/16/2019)   . ezetimibe (ZETIA) 10 MG tablet Take 1 tablet (10 mg total) by mouth daily.   .Marland Kitchenglucose blood test strip Check blood sugars once daily   . levothyroxine (SYNTHROID) 75 MCG tablet Take 1 tablet (75 mcg total) by mouth daily before breakfast. 03/05/2020: Wants to switch back to previous dose- he feels better when taking that  . metFORMIN (GLUCOPHAGE) 500 MG tablet Take 2 tablets (1,000 mg total) by mouth 2 (two) times daily with a meal. 11/16/2019: Reports he sometimes takes once daily vs twice daily  . nitroGLYCERIN (NITROSTAT) 0.4  MG SL tablet Place 1 tablet (0.4 mg total) under the tongue every 5 (five) minutes x 3 doses as needed for chest pain. (Patient not taking: Reported on 06/03/2019) 03/05/2020: PRN  . sitaGLIPtin (JANUVIA) 100 MG tablet Take 1 tablet (100 mg total) by mouth daily.   . TRUEplus Lancets 33G MISC Use to check blood sugar once a [place].  Dx code: E11.9    No facility-administered encounter medications on file as of 04/20/2020.   SDOH Screenings   Alcohol Screen: Not on file  Depression (PHQ2-9): Low Risk   . PHQ-2 Score: 0  Financial Resource Strain: Low Risk   . Difficulty of Paying Living Expenses: Not hard at all  Food Insecurity: Not on file  Housing: Not on file  Physical Activity: Not on file  Social Connections: Not on file  Stress: Not on file  Tobacco Use: Low Risk   . Smoking Tobacco Use: Never Smoker  . Smokeless Tobacco Use: Never Used  Transportation Needs: Not on file     Current Diagnosis/Assessment:  Goals Addressed            This Visit's Progress   . Chronic Care Management Pharmacy Care Plan       CARE PLAN ENTRY (see longitudinal plan of care for additional care plan information)  Current Barriers:  . Chronic Disease Management support, education, and care coordination needs related to Diabetes, Hyperlipidemia, Hypothyroidism, Osteoporosis   Hyperlipidemia Lab Results  Component  Value Date/Time   LDLCALC 43 12/16/2019 08:24 AM   . Pharmacist Clinical Goal(s): o Over the next 90 days, patient will work with PharmD and providers to maintain LDL goal < 100 . Current regimen:  . Atorvastatin 58m daily  . Ezetimibe 137mdaily . Interventions: o Emphasized the importance of med adherence o Discussed a1c goal . Patient self care activities - Over the next 90 days, patient will: o Maintain cholesterol medication regimen.   Diabetes Lab Results  Component Value Date/Time   HGBA1C 6.9 (H) 03/05/2020 09:45 AM   HGBA1C 7.6 (H) 11/01/2019 09:46 AM    . Pharmacist Clinical Goal(s): o Over the next 90 days, patient will work with PharmD and providers to maintain A1c goal <7% . Current regimen:   Metformin 50036m2 tabs twice daily  Januvia 100m61mily . Interventions: o Emphasized the importance of med adherence o Discussed LDL goal . Patient self care activities - Over the next 90 days, patient will: o Check blood sugar once daily, document, and provide at future appointments o Contact provider with any episodes of hypoglycemia o Maintain diabetes medication regimen.   Hypothyroidism . Pharmacist Clinical Goal(s) o Over the next 90 days, patient will work with PharmD and providers to maintain TSH within normal limits . Current regimen:  o Levothyroxine 75mc45mily . Interventions: o Emphasized the importance of med adherence . Patient self care activities - Over the next 90 days, patient will: o Maintain hypothyroid medication regimen.   Osteoporosis . Pharmacist Clinical Goal(s) o Over the next 90 days, patient will work with PharmD and providers to reduce risk of fracture due to osteoporosis  . Current regimen:  o Alendronate 70mg 33my o Calcium 500mg d56m/Vitamin D 5000 units daily  . Interventions: o Consider intake of 1200mg of66mcium daily through diet and/or supplementation o Consider intake of [phone] units of vitamin D through supplementation  o Discussed importance of keeping appt for DEXA Scan tomorrow 11/17/19 o Discussed possibility of bisphosphonate therapy pending Dr. Paz apprLarose Kellsl (accepted) . Patient self care activities - Over the next 90 days, patient will: o Consider intake of 1200mg of 73mium daily through diet and/or supplementation o Consider intake of [phone] units of vitamin D through supplementation  o Maintain osteoporosis medication regimen  Medication management . Pharmacist Clinical Goal(s): o Over the next 90 days, patient will work with PharmD and providers to achieve optimal  medication adherence . [facility] o Comprehensive medication review performed. o Continue current medication management strategy . Patient self care activities - Over the next 90 days, patient will: o Focus on medication adherence by filling and taking medications appropriately  o Take medications as prescribed o Report any questions or concerns to PharmD and/or provider(s)  Please see past updates related to this goal by clicking on the "Past Updates" button in the selected goal        Social Hx:  Lives with son. Retired. He has 3 children. 6 grandchildren His daughter in law works as a pharmacy Education administrator 04/20/20 States he is going [facility] [employer] [facility]  Diabetes   A1c goal <7%  Recent Relevant Labs: Lab Results  Component Value Date/Time   HGBA1C 6.9 (H) 03/05/2020 09:45 AM   HGBA1C 7.6 (H) 11/01/2019 09:46 AM   MICROALBUR 2.5 (H) 11/01/2019 09:46 AM   MICROALBUR 2.7 (H) 10/15/2018 09:31 AM     Checking BG: 3-4  times per week  Recent FBG Readings: 120s per patient Patient has failed these meds in past: None noted  Patient is currently controlled on the following medications:   Metformin 556m #2 tabs twice daily  Januvia 1032mdaily  Reports he takes metformin sometimes once daily and sometimes twice daily. Admits he does not take his Januvia regularly.  "I don't think I have to take all these things"  Last diabetic Foot exam:  Lab Results  Component Value Date/Time   HMDIABEYEEXA No Retinopathy 02/23/2020 12:00 AM    Last diabetic Eye exam:  Lab Results  Component Value Date/Time   HMDIABFOOTEX Normal/c/o feet pain 03/08/2015 12:00 AM    Emphasized the importance of taking metformin twice daily and januvia daily noting his elevated a1c.    We discussed: A1c goal and importance of med adherence   Update 04/20/20 A1c improved since initial CCM visit on 11/16/19!  Congratulated patient on this!   Plan -Continue current medications (take both medications daily)   Hyperlipidemia   LDL goal <100  Lipid Panel     Component Value Date/Time   CHOL 105 12/16/2019 0824   TRIG 66.0 12/16/2019 0824   HDL 48.90 12/16/2019 0824   LDLCALC 43 12/16/2019 0824    Hepatic Function Latest Ref Rng & Units 11/01/2019 10/15/2018 10/01/2017  Total Protein 6.0 - 8.3 g/dL 7.3 7.1 -  Albumin 3.5 - 5.2 g/dL 4.2 4.2 -  AST 0 - 37 U/L '31 24 24  ' ALT 0 - 53 U/L '22 14 16  ' Alk Phosphatase 39 - 117 U/L 104 67 -  Total Bilirubin 0.2 - 1.2 mg/dL 0.7 0.6 -     The ASCVD Risk score (GMikey BussingC Jr., et al., 2013) failed to calculate for the following reasons:   The 2013 ASCVD risk score is only valid for ages 4032o 7921 Patient has failed these meds in past: pravastatin (efficacy?) Patient is currently controlled on the following medications:  . Atorvastatin 4061maily  . Ezetimibe 7m58mily  We discussed:  LDL goal and importance of med adherence   Update 04/20/20 LDL improved since initial CCM visit on 11/16/19. Congratulated patient on this!   Plan -Continue current medications (take both medications daily)  Hypothyroidism   Lab Results  Component Value Date/Time   TSH 2.21 03/05/2020 09:45 AM   TSH 1.08 12/16/2019 08:24 AM   FREET4 0.72 10/01/2017 03:59 PM    Patient has failed these meds in past: None noted  Patient is currently controlled on the following medications:  . Levothyroxine 75mc93mily  Reports he does not skip this medicine.  Admits that [place]  We discussed:  Importance of med adherence   Update 04/20/20 TSH now WNL  Plan -Continue current medications  Osteoporosis/Hx of Hip Fracture   Last DEXA Scan: 11/17/19   T-Score femoral neck:  L -1.5  Lumbar Spine (L1-L4): 0  Major Fracture Risk: 4.4%  Hip Fracture Risk: 1.8%   No results found  for: VD25OH   Patient is a candidate for pharmacologic treatment due to history of hip fracture   Patient has failed these meds in past: None noted  Patient is currently uncontrolled on the following medications:  . Alendronate 70mg 52mly - Sunday (started at 11/29/19 visit w/ Dr. Paz)  Larose Kellsl [facility] [employer] [facility] [place] resulted in him breaking his hip.  Emphasized the importance of making his appt for bone density scan tomorrow. Calcium 564m  Vitamin D 5000 units   We discussed:  Recommend [phone] units of vitamin D daily. Recommend 1200 mg of calcium daily from dietary and supplemental sources.   Update 04/20/20 Tolerating alendronate? Yes States he has swelling in the same leg where he broke his hip. Swelling develops at night, but resolves by the morning. States he has a follow up visit to discuss in January.  Recommended patient to elevate his legs, admits he has not been doing this.    Plan -Consider taking calcium twice daily  -Ensure intake of [phone] units of vitamin D daily  Miscellaneous Meds  Diclofenac gel (not using due to not having much pain)   KDe Blanch PharmD, BCACP Clinical Pharmacist [phone]

## 2020-04-20 NOTE — Patient Instructions (Addendum)
Visit Information  Goals Addressed            This Visit's Progress   . Chronic Care Management Pharmacy Care Plan       CARE PLAN ENTRY (see longitudinal plan of care for additional care plan information)  Current Barriers:  . Chronic Disease Management support, education, and care coordination needs related to Diabetes, Hyperlipidemia, Hypothyroidism, Osteoporosis   Hyperlipidemia Lab Results  Component Value Date/Time   LDLCALC 43 12/16/2019 08:24 AM   . Pharmacist Clinical Goal(s): o Over the next 90 days, patient will work with PharmD and providers to maintain LDL goal < 100 . Current regimen:  . Atorvastatin 40mg  daily  . Ezetimibe 10mg  daily . Interventions: o Emphasized the importance of med adherence o Discussed a1c goal . Patient self care activities - Over the next 90 days, patient will: o Maintain cholesterol medication regimen.   Diabetes Lab Results  Component Value Date/Time   HGBA1C 6.9 (H) 03/05/2020 09:45 AM   HGBA1C 7.6 (H) 11/01/2019 09:46 AM   . Pharmacist Clinical Goal(s): o Over the next 90 days, patient will work with PharmD and providers to maintain A1c goal <7% . Current regimen:   Metformin 500mg  #2 tabs twice daily  Januvia 100mg  daily . Interventions: o Emphasized the importance of med adherence o Discussed LDL goal . Patient self care activities - Over the next 90 days, patient will: o Check blood sugar once daily, document, and provide at future appointments o Contact provider with any episodes of hypoglycemia o Maintain diabetes medication regimen.   Hypothyroidism . Pharmacist Clinical Goal(s) o Over the next 90 days, patient will work with PharmD and providers to maintain TSH within normal limits . Current regimen:  o Levothyroxine 59mcg daily . Interventions: o Emphasized the importance of med adherence . Patient self care activities - Over the next 90 days, patient will: o Maintain hypothyroid medication regimen.    Osteoporosis . Pharmacist Clinical Goal(s) o Over the next 90 days, patient will work with PharmD and providers to reduce risk of fracture due to osteoporosis  . Current regimen:  o Alendronate 70mg  weely o Calcium 500mg  daily/Vitamin D 5000 units daily  . Interventions: o Consider intake of 1200mg  of calcium daily through diet and/or supplementation o Consider intake of [phone] units of vitamin D through supplementation  o Discussed importance of keeping appt for DEXA Scan tomorrow 11/17/19 o Discussed possibility of bisphosphonate therapy pending Dr. Larose Kells approval (accepted) . Patient self care activities - Over the next 90 days, patient will: o Consider intake of 1200mg  of calcium daily through diet and/or supplementation o Consider intake of [phone] units of vitamin D through supplementation  o Maintain osteoporosis medication regimen  Medication management . Pharmacist Clinical Goal(s): o Over the next 90 days, patient will work with PharmD and providers to achieve optimal medication adherence . Current pharmacy: [facility] . Interventions o Comprehensive medication review performed. o Continue current medication management strategy . Patient self care activities - Over the next 90 days, patient will: o Focus on medication adherence by filling and taking medications appropriately  o Take medications as prescribed o Report any questions or concerns to PharmD and/or provider(s)  Please see past updates related to this goal by clicking on the "Past Updates" button in the selected goal         The patient verbalized understanding of instructions, educational materials, and care plan provided today and agreed to receive a mailed copy of patient instructions,  educational materials, and care plan.   Telephone follow up appointment with pharmacy team member scheduled for: 10/19/2020  Marco [name], [facility]

## 2020-05-09 ENCOUNTER — Ambulatory Visit: Payer: Medicare HMO | Admitting: Orthopedic Surgery

## 2020-05-11 ENCOUNTER — Telehealth: Payer: Self-pay | Admitting: Internal Medicine

## 2020-05-11 MED ORDER — METFORMIN HCL 500 MG PO TABS
1000.0000 mg | ORAL_TABLET | Freq: Two times a day (BID) | ORAL | 1 refills | Status: DC
Start: 2020-05-11 — End: 2020-08-10

## 2020-05-11 MED ORDER — EZETIMIBE 10 MG PO TABS
10.0000 mg | ORAL_TABLET | Freq: Every day | ORAL | 1 refills | Status: DC
Start: 2020-05-11 — End: 2020-11-22

## 2020-05-11 MED ORDER — LEVOTHYROXINE SODIUM 75 MCG PO TABS
75.0000 ug | ORAL_TABLET | Freq: Every day | ORAL | 1 refills | Status: DC
Start: 2020-05-11 — End: 2020-08-10

## 2020-05-11 MED ORDER — SITAGLIPTIN PHOSPHATE 100 MG PO TABS
100.0000 mg | ORAL_TABLET | Freq: Every day | ORAL | 1 refills | Status: DC
Start: 2020-05-11 — End: 2020-08-13

## 2020-05-11 MED ORDER — ALENDRONATE SODIUM 70 MG PO TABS
70.0000 mg | ORAL_TABLET | ORAL | 3 refills | Status: DC
Start: 2020-05-11 — End: 2021-04-30

## 2020-05-11 MED ORDER — ATORVASTATIN CALCIUM 40 MG PO TABS
40.0000 mg | ORAL_TABLET | Freq: Every day | ORAL | 1 refills | Status: DC
Start: 2020-05-11 — End: 2020-08-13

## 2020-05-11 NOTE — Telephone Encounter (Signed)
Rxs sent

## 2020-05-11 NOTE — Telephone Encounter (Signed)
Patient states he change his insurance and need all his medication to be sent to   [name] [place], [place] - [address] AT [place] Phone:  [phone]  Fax:  [phone]

## 2020-05-23 ENCOUNTER — Telehealth: Payer: Self-pay | Admitting: Pharmacist

## 2020-05-23 NOTE — Progress Notes (Addendum)
Chronic Care Management Pharmacy Assistant   Name: [name]  MRN: [id number] DOB: 07-22-1933  Reason for Encounter: Disease State for DM.  Patient Questions:  1.  Have you seen any other providers since your last visit? No.   2.  Any changes in your medicines or health? No.  PCP : Colon Branch, MD   Their chronic conditions include: Diabetes, Hyperlipidemia, Hypothyroidism, Osteoporosis  Office Visits: None since 04/20/20  Consults: None since 04/20/20  Allergies:  No Known Allergies  Medications: Outpatient Encounter Medications as of 05/23/2020  Medication Sig Note   Alcohol Swabs PADS Use to clean finger prior to testing blood sugars    alendronate (FOSAMAX) 70 MG tablet Take 1 tablet (70 mg total) by mouth once a week. Take with a full glass of water on an empty stomach.    aspirin EC 81 MG EC tablet Take 1 tablet (81 mg total) by mouth daily.    atorvastatin (LIPITOR) 40 MG tablet Take 1 tablet (40 mg total) by mouth at bedtime.    Blood Glucose Calibration (TRUE METRIX LEVEL 1) Low SOLN Use as directed    Blood Glucose Monitoring Suppl (TRUE METRIX METER) w/Device KIT CHECK BLOOD SUGARS AS DIRECTED    diclofenac sodium (VOLTAREN) 1 % GEL Apply 2 g topically 4 (four) times daily. (Patient not taking: Reported on 11/16/2019)    ezetimibe (ZETIA) 10 MG tablet Take 1 tablet (10 mg total) by mouth daily.    glucose blood test strip Check blood sugars once daily    levothyroxine (SYNTHROID) 75 MCG tablet Take 1 tablet (75 mcg total) by mouth daily before breakfast.    metFORMIN (GLUCOPHAGE) 500 MG tablet Take 2 tablets (1,000 mg total) by mouth 2 (two) times daily with a meal.    nitroGLYCERIN (NITROSTAT) 0.4 MG SL tablet Place 1 tablet (0.4 mg total) under the tongue every 5 (five) minutes x 3 doses as needed for chest pain. (Patient not taking: Reported on 06/03/2019) 03/05/2020: PRN   sitaGLIPtin (JANUVIA) 100 MG tablet Take 1 tablet (100 mg total) by mouth daily.     TRUEplus Lancets 33G MISC Use to check blood sugar once a day.  Dx code: E11.9    No facility-administered encounter medications on file as of 05/23/2020.    Current Diagnosis: Patient Active Problem List   Diagnosis Date Noted   CAD (coronary artery disease) 03/06/2020   Hypothyroidism 12/02/2017   Chronic pain of right knee 10/13/2017   Chest pain 11/19/2016   Amaurosis fugax 12/12/2015   PCP NOTES >>> 03/08/2015   Osteoporosis 09/15/2014   Primary osteoarthritis of both knees 03/16/2014   Hearing difficulty 10/20/2013   DJD (degenerative joint disease) 08/25/2013   Neck mass 04/25/2013   Hyperlipidemia 04/25/2013   Erectile dysfunction 12/26/2011   Annual physical exam 08/22/2011   Prostate nodule 08/22/2011   Diabetes mellitus ([place]) 05/07/2011    Goals Addressed   None    Recent Relevant Labs: Lab Results  Component Value Date/Time   HGBA1C 6.9 (H) 03/05/2020 09:45 AM   HGBA1C 7.6 (H) 11/01/2019 09:46 AM   MICROALBUR 2.5 (H) 11/01/2019 09:46 AM   MICROALBUR 2.7 (H) 10/15/2018 09:31 AM    Kidney Function Lab Results  Component Value Date/Time   CREATININE 1.36 (H) 03/05/2020 09:45 AM   CREATININE 1.21 11/01/2019 09:46 AM   CREATININE 1.36 04/18/2019 08:37 AM   GFR 57.07 (L) 11/01/2019 09:46 AM   GFRNONAA >60 11/21/2016 04:07 AM   GFRAA >  60 11/21/2016 04:07 AM    Current antihyperglycemic regimen:  Metformin 500 mg #2 tabs twice daily Januvia 100 mg daily  What recent interventions/DTPs have been made to improve glycemic control:  None.  Have there been any recent hospitalizations or ED visits since last visit with CPP?  No.  Patient reports hypoglycemic symptoms, including None   Patient reports hyperglycemic symptoms, including none   How often are you checking your blood sugar? Patient stated he checks his sugar once daily .   What are your blood sugars ranging? Patient stated his readings are between 110-120. Fasting: N/A Before meals:  N/A After meals: N/A Bedtime: N/A  During the week, how often does your blood glucose drop below 70? Patient stated Never .  Are you checking your feet daily/regularly? Patient stated he checks his feet regularly.    Adherence Review: Is the patient currently on a STATIN medication? Yes, Atorvastatin 40 mg  Is the patient currently on ACE/ARB medication? No.  Does the patient have >5 day gap between last estimated fill dates? No   Follow-Up:  Pharmacist Review   Charlann Lange, RMA Clinical Pharmacist Assistant [phone]  3 minutes spent in review, coordination, and documentation.  Reviewed by: Beverly Milch, PharmD Clinical Pharmacist [facility] [phone]

## 2020-05-31 ENCOUNTER — Ambulatory Visit: Payer: Medicare Other | Admitting: Orthopedic Surgery

## 2020-05-31 DIAGNOSIS — H524 Presbyopia: Secondary | ICD-10-CM | POA: Diagnosis not present

## 2020-06-21 ENCOUNTER — Ambulatory Visit (INDEPENDENT_AMBULATORY_CARE_PROVIDER_SITE_OTHER): Payer: Medicare Other | Admitting: Orthopedic Surgery

## 2020-06-21 ENCOUNTER — Telehealth: Payer: Self-pay

## 2020-06-21 ENCOUNTER — Other Ambulatory Visit: Payer: Self-pay

## 2020-06-21 DIAGNOSIS — M1712 Unilateral primary osteoarthritis, left knee: Secondary | ICD-10-CM | POA: Diagnosis not present

## 2020-06-21 DIAGNOSIS — M170 Bilateral primary osteoarthritis of knee: Secondary | ICD-10-CM

## 2020-06-21 DIAGNOSIS — M1711 Unilateral primary osteoarthritis, right knee: Secondary | ICD-10-CM | POA: Diagnosis not present

## 2020-06-21 NOTE — Telephone Encounter (Signed)
Noted  

## 2020-06-21 NOTE — Telephone Encounter (Signed)
Can we get patient approved for bil gel inejction?

## 2020-06-24 ENCOUNTER — Encounter: Payer: Self-pay | Admitting: Orthopedic Surgery

## 2020-06-24 DIAGNOSIS — M1711 Unilateral primary osteoarthritis, right knee: Secondary | ICD-10-CM

## 2020-06-24 MED ORDER — LIDOCAINE HCL 1 % IJ SOLN
5.0000 mL | INTRAMUSCULAR | Status: AC | PRN
  Administered 2020-06-24: 5 mL

## 2020-06-24 MED ORDER — METHYLPREDNISOLONE ACETATE 40 MG/ML IJ SUSP
40.0000 mg | INTRAMUSCULAR | Status: AC | PRN
  Administered 2020-06-24: 40 mg via INTRA_ARTICULAR

## 2020-06-24 MED ORDER — BUPIVACAINE HCL 0.25 % IJ SOLN
4.0000 mL | INTRAMUSCULAR | Status: AC | PRN
  Administered 2020-06-24: 4 mL via INTRA_ARTICULAR

## 2020-06-24 NOTE — Progress Notes (Signed)
Office Visit Note   Patient: [name]           Date of Birth: 19-Oct-1933           MRN: [id number] Visit Date: 06/21/2020 Requested by: Colon Branch, [address] [place],  [place] [address] PCP: Colon Branch, MD  Subjective: Chief Complaint  Patient presents with  . Left Knee - Pain  . Right Knee - Pain    HPI: Patient presents for evaluation of bilateral knees.  Has prior gel injection 11/02/2019.  States that his knees are getting little bit worse right more than left.  He ambulates with a cane.  He does not really want to get knee replacement.  Overall he is managing fairly well but is having some increasing pain in his knees.              ROS: All systems reviewed are negative as they relate to the chief complaint within the history of present illness.  Patient denies  fevers or chills.   Assessment & Plan: Visit Diagnoses:  1. Bilateral primary osteoarthritis of knee     Plan: Impression is bilateral knee arthritis right worse than left.  Plan is aspiration cortisone injection right knee today preapproved gel shot bilateral knees for 8 weeks.  Come back at that time for those injections.  Continue with nonweightbearing quad strengthening exercises. This patient is diagnosed with osteoarthritis of the knee(s).    Radiographs show evidence of joint space narrowing, osteophytes, subchondral sclerosis and/or subchondral cysts.  This patient has knee pain which interferes with functional and activities of daily living.    This patient has experienced inadequate response, adverse effects and/or intolerance with conservative treatments such as acetaminophen, NSAIDS, topical creams, physical therapy or regular exercise, knee bracing and/or weight loss.   This patient has experienced inadequate response or has a contraindication to intra articular steroid injections for at least 3 months.   This patient is not scheduled to have a total knee replacement within  6 months of starting treatment with viscosupplementation.   Follow-Up Instructions: Return in about 8 weeks (around 08/16/2020).   Orders:  No orders of the defined types were placed in this encounter.  No orders of the defined types were placed in this encounter.     Procedures: Large Joint Inj: R knee on 06/24/2020 12:17 AM Indications: diagnostic evaluation, joint swelling and pain Details: 18 G 1.5 in needle, superolateral approach  Arthrogram: No  Medications: 5 mL lidocaine 1 %; 40 mg methylPREDNISolone acetate 40 MG/ML; 4 mL bupivacaine 0.25 % Outcome: tolerated well, no immediate complications Procedure, treatment alternatives, risks and benefits explained, specific risks discussed. Consent was given by the patient. Immediately prior to procedure a time out was called to verify the correct patient, procedure, equipment, support staff and site/side marked as required. Patient was prepped and draped in the usual sterile fashion.       Clinical Data: No additional findings.  Objective: Vital Signs: There were no vitals taken for this visit.  Physical Exam:   Constitutional: Patient appears well-developed HEENT:  Head: Normocephalic Eyes:EOM are normal Neck: Normal range of motion Cardiovascular: Normal rate Pulmonary/chest: Effort normal Neurologic: Patient is alert Skin: Skin is warm Psychiatric: Patient has normal mood and affect    Ortho Exam: Ortho exam demonstrates slightly antalgic gait to the right.  Has trace right knee effusion no left knee effusion.  Pedal pulses are palpable.  Has a  rash superior lateral aspect of the right knee which appears like contact dermatitis measuring about 3 x 4 cm.  Extensor mechanism is intact collateral cruciate ligaments are stable pedal pulses palpable range of motion is near full extension both knees to about 120 of flexion  Specialty Comments:  No specialty comments available.  Imaging: No results found.   PMFS  History: Patient Active Problem List   Diagnosis Date Noted  . CAD (coronary artery disease) 03/06/2020  . Hypothyroidism 12/02/2017  . Chronic pain of right knee 10/13/2017  . Chest pain 11/19/2016  . 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 PCP NOTES >>> 03/08/2015  . Osteoporosis 09/15/2014  . Primary osteoarthritis of both knees 03/16/2014  . Hearing difficulty 10/20/2013  . DJD (degenerative joint disease) 08/25/2013  . Neck mass 04/25/2013  . Hyperlipidemia 04/25/2013  . Erectile dysfunction 12/26/2011  . Annual physical exam 08/22/2011  . Prostate nodule 08/22/2011  . Diabetes mellitus ([place]) 05/07/2011   Past Medical History:  Diagnosis Date  . Amaurosis fugax 2017  . Diabetes mellitus   . DJD (degenerative joint disease) 08/25/2013  . Elevated PSA   . Other and unspecified hyperlipidemia 04/25/2013    Family History  Problem Relation Age of Onset  . Other Mother        Unknown   . Other Father        Unknown  . Diabetes Neg Hx   . CAD Neg Hx   . Cancer Neg Hx     Past Surgical History:  Procedure Laterality Date  . CATARACT EXTRACTION     Bilaterally  . TYMPANIC MEMBRANE REPAIR     R   Social History   Occupational History  . Occupation: Retired  Tobacco Use  . Smoking status: Never Smoker  . Smokeless tobacco: Never Used  Substance and Sexual Activity  . Alcohol use: No  . Drug use: No  . Sexual activity: Not on file

## 2020-06-26 ENCOUNTER — Encounter: Payer: Self-pay | Admitting: Internal Medicine

## 2020-06-26 DIAGNOSIS — H9201 Otalgia, right ear: Secondary | ICD-10-CM

## 2020-06-28 ENCOUNTER — Encounter: Payer: Self-pay | Admitting: Orthopedic Surgery

## 2020-07-04 ENCOUNTER — Telehealth: Payer: Self-pay

## 2020-07-04 NOTE — Telephone Encounter (Signed)
Submitted VOB for Durolane, bilateral knee. Pending BV. 

## 2020-07-05 DIAGNOSIS — H608X1 Other otitis externa, right ear: Secondary | ICD-10-CM | POA: Diagnosis not present

## 2020-07-20 ENCOUNTER — Other Ambulatory Visit: Payer: Self-pay

## 2020-07-20 ENCOUNTER — Encounter: Payer: Self-pay | Admitting: Orthopedic Surgery

## 2020-07-20 ENCOUNTER — Telehealth: Payer: Self-pay

## 2020-07-20 ENCOUNTER — Ambulatory Visit (INDEPENDENT_AMBULATORY_CARE_PROVIDER_SITE_OTHER): Payer: Medicare Other | Admitting: Orthopedic Surgery

## 2020-07-20 DIAGNOSIS — M1711 Unilateral primary osteoarthritis, right knee: Secondary | ICD-10-CM

## 2020-07-20 DIAGNOSIS — M170 Bilateral primary osteoarthritis of knee: Secondary | ICD-10-CM

## 2020-07-20 MED ORDER — LIDOCAINE HCL 1 % IJ SOLN
5.0000 mL | INTRAMUSCULAR | Status: AC | PRN
  Administered 2020-07-20: 5 mL

## 2020-07-20 MED ORDER — SODIUM HYALURONATE 60 MG/3ML IX PRSY
60.0000 mg | PREFILLED_SYRINGE | INTRA_ARTICULAR | Status: AC | PRN
  Administered 2020-07-20: 60 mg via INTRA_ARTICULAR

## 2020-07-20 NOTE — Progress Notes (Signed)
   Procedure Note  Patient: [name]             Date of Birth: Nov 06, 1933           MRN: [id number]             Visit Date: 07/20/2020  Procedures: Visit Diagnoses:  1. Bilateral primary osteoarthritis of knee     Large Joint Inj: R knee on 07/20/2020 3:59 PM Indications: diagnostic evaluation, joint swelling and pain Details: 18 G 1.5 in needle, superolateral approach  Arthrogram: No  Medications: 5 mL lidocaine 1 %; 60 mg Sodium Hyaluronate 60 MG/3ML Outcome: tolerated well, no immediate complications Procedure, treatment alternatives, risks and benefits explained, specific risks discussed. Consent was given by the patient. Immediately prior to procedure a time out was called to verify the correct patient, procedure, equipment, support staff and site/side marked as required. Patient was prepped and draped in the usual sterile fashion.

## 2020-07-20 NOTE — Telephone Encounter (Signed)
Approved for Durolane, bilateral knee. [place] Patient will be responsible for 20% OOP No Co-pay No PA required  Appt. 07/20/2020 with Dr. Marlou Sa

## 2020-08-09 ENCOUNTER — Encounter: Payer: Self-pay | Admitting: Internal Medicine

## 2020-08-09 MED ORDER — BLOOD GLUCOSE METER KIT: PACK | 0 refills | Status: DC

## 2020-08-10 ENCOUNTER — Other Ambulatory Visit: Payer: Self-pay | Admitting: Internal Medicine

## 2020-08-11 ENCOUNTER — Other Ambulatory Visit: Payer: Self-pay | Admitting: Internal Medicine

## 2020-08-18 ENCOUNTER — Other Ambulatory Visit: Payer: Self-pay | Admitting: Internal Medicine

## 2020-09-06 ENCOUNTER — Other Ambulatory Visit: Payer: Self-pay

## 2020-09-06 ENCOUNTER — Ambulatory Visit (INDEPENDENT_AMBULATORY_CARE_PROVIDER_SITE_OTHER): Payer: Medicare Other | Admitting: Internal Medicine

## 2020-09-06 ENCOUNTER — Encounter: Payer: Self-pay | Admitting: Internal Medicine

## 2020-09-06 VITALS — BP 122/72 | HR 58 | Temp 97.8°F | Resp 18 | Ht 69.0 in | Wt 138.4 lb

## 2020-09-06 DIAGNOSIS — E039 Hypothyroidism, unspecified: Secondary | ICD-10-CM | POA: Diagnosis not present

## 2020-09-06 DIAGNOSIS — Z0000 Encounter for general adult medical examination without abnormal findings: Secondary | ICD-10-CM

## 2020-09-06 DIAGNOSIS — E785 Hyperlipidemia, unspecified: Secondary | ICD-10-CM | POA: Diagnosis not present

## 2020-09-06 DIAGNOSIS — E1169 Type 2 diabetes mellitus with other specified complication: Secondary | ICD-10-CM | POA: Diagnosis not present

## 2020-09-06 LAB — CBC WITH DIFFERENTIAL/PLATELET
Basophils Absolute: 0 10*3/uL (ref 0.0–0.1)
Basophils Relative: 0.9 % (ref 0.0–3.0)
Eosinophils Absolute: 0.4 10*3/uL (ref 0.0–0.7)
Eosinophils Relative: 7.9 % — ABNORMAL HIGH (ref 0.0–5.0)
HCT: 37.3 % — ABNORMAL LOW (ref 39.0–52.0)
Hemoglobin: 12 g/dL — ABNORMAL LOW (ref 13.0–17.0)
Lymphocytes Relative: 25.2 % (ref 12.0–46.0)
Lymphs Abs: 1.3 10*3/uL (ref 0.7–4.0)
MCHC: 32.1 g/dL (ref 30.0–36.0)
MCV: 83.8 fl (ref 78.0–100.0)
Monocytes Absolute: 0.4 10*3/uL (ref 0.1–1.0)
Monocytes Relative: 8.9 % (ref 3.0–12.0)
Neutro Abs: 2.9 10*3/uL (ref 1.4–7.7)
Neutrophils Relative %: 57.1 % (ref 43.0–77.0)
Platelets: 188 10*3/uL (ref 150.0–400.0)
RBC: 4.45 Mil/uL (ref 4.22–5.81)
RDW: 13.7 % (ref 11.5–15.5)
WBC: 5 10*3/uL (ref 4.0–10.5)

## 2020-09-06 LAB — COMPREHENSIVE METABOLIC PANEL
ALT: 15 U/L (ref 0–53)
AST: 23 U/L (ref 0–37)
Albumin: 4.2 g/dL (ref 3.5–5.2)
Alkaline Phosphatase: 86 U/L (ref 39–117)
BUN: 15 mg/dL (ref 6–23)
CO2: 28 mEq/L (ref 19–32)
Calcium: 10.2 mg/dL (ref 8.4–10.5)
Chloride: 104 mEq/L (ref 96–112)
Creatinine, Ser: 1.51 mg/dL — ABNORMAL HIGH (ref 0.40–1.50)
GFR: 41.91 mL/min — ABNORMAL LOW (ref >60.00)
Glucose, Bld: 112 mg/dL — ABNORMAL HIGH (ref 70–99)
Potassium: 5.3 mEq/L — ABNORMAL HIGH (ref 3.5–5.1)
Sodium: 140 mEq/L (ref 135–145)
Total Bilirubin: 0.7 mg/dL (ref 0.2–1.2)
Total Protein: 7.1 g/dL (ref 6.0–8.3)

## 2020-09-06 LAB — LIPID PANEL
Cholesterol: 126 mg/dL (ref 0–200)
HDL: 52.6 mg/dL (ref >39.00)
LDL Cholesterol: 56 mg/dL (ref 0–99)
NonHDL: 73.26
Total CHOL/HDL Ratio: 2
Triglycerides: 88 mg/dL (ref 0.0–149.0)
VLDL: 17.6 mg/dL (ref 0.0–40.0)

## 2020-09-06 LAB — MICROALBUMIN / CREATININE URINE RATIO
Creatinine,U: 93.6 mg/dL
Microalb Creat Ratio: 1.8 mg/g (ref 0.0–30.0)
Microalb, Ur: 1.7 mg/dL (ref 0.0–1.9)

## 2020-09-06 LAB — TSH: TSH: 3.81 u[IU]/mL (ref 0.35–4.50)

## 2020-09-06 LAB — HEMOGLOBIN A1C: Hgb A1c MFr Bld: 6.9 % — ABNORMAL HIGH (ref 4.6–6.5)

## 2020-09-06 NOTE — Progress Notes (Signed)
Subjective:    Patient ID: AUTHOR HATLESTAD, male    DOB: 08/18/1933, 87 y.o.   MRN: [id number]  DOS:  09/06/2020 Type of visit - description: CPX  In general feels well. He has noted weight loss, he is not sure why. He is specifically denies fever, abdominal pain, diarrhea, blood in the stools. No unusual myalgias, does have knee pain, recently saw orthopedics. No recent falls No depression  Wt Readings from Last 3 Encounters:  09/06/20 138 lb 6 oz (62.8 kg)  03/05/20 145 lb 2 oz (65.8 kg)  11/29/19 142 lb 2 oz (64.5 kg)     Review of Systems  Other than above, a 14 point review of systems is negative       Past Medical History:  Diagnosis Date  . Amaurosis fugax 2017  . Diabetes mellitus   . DJD (degenerative joint disease) 08/25/2013  . Elevated PSA   . Other and unspecified hyperlipidemia 04/25/2013    Past Surgical History:  Procedure Laterality Date  . CATARACT EXTRACTION     Bilaterally  . TYMPANIC MEMBRANE REPAIR     R   Social History   Socioeconomic History  . Marital status: Married    Spouse name: Not on file  . Number of children: 3  . Years of education: Not on file  . Highest education level: Not on file  Occupational History  . Occupation: Retired  Tobacco Use  . Smoking status: Never Smoker  . Smokeless tobacco: Never Used  Substance and Sexual Activity  . Alcohol use: No  . Drug use: No  . Sexual activity: Not on file  Other Topics Concern  . Not on file  Social History Narrative   Moved to the [place] area 2012     Original from [place] , Married     Lives w/ wife, son and his family    2 daughters, 1 son     Social Determinants of Health   Financial Resource Strain: Low Risk   . Difficulty of Paying Living Expenses: Not hard at all  Food Insecurity: Not on file  Transportation Needs: Not on file  Physical Activity: Not on file  Stress: Not on file  Social Connections: Not on file  Intimate Partner Violence: Not on file      Allergies as of 09/06/2020   No Known Allergies     Medication List       Accurate as of Sep 06, 2020 11:59 PM. If you have any questions, ask your nurse or doctor.        Accu-Chek Guide w/Device Kit Check blood sugars once daily   Alcohol Swabs Pads Use to clean finger prior to testing blood sugars   alendronate 70 MG tablet Commonly known as: FOSAMAX Take 1 tablet (70 mg total) by mouth once a week. Take with a full glass of water on an empty stomach.   aspirin 81 MG EC tablet Take 1 tablet (81 mg total) by mouth daily.   atorvastatin 40 MG tablet Commonly known as: LIPITOR Take 1 tablet (40 mg total) by mouth at bedtime.   diclofenac sodium 1 % Gel Commonly known as: VOLTAREN Apply 2 g topically 4 (four) times daily.   ezetimibe 10 MG tablet Commonly known as: ZETIA Take 1 tablet (10 mg total) by mouth daily.   glucose blood test strip Check blood sugars once daily   levothyroxine 75 MCG tablet Commonly known as: SYNTHROID Take 1 tablet (75 mcg total) by  mouth daily before breakfast.   metFORMIN 500 MG tablet Commonly known as: GLUCOPHAGE Take 2 tablets (1,000 mg total) by mouth 2 (two) times daily with a meal.   nitroGLYCERIN 0.4 MG SL tablet Commonly known as: NITROSTAT Place 1 tablet (0.4 mg total) under the tongue every 5 (five) minutes x 3 doses as needed for chest pain.   sitaGLIPtin 100 MG tablet Commonly known as: Januvia Take 1 tablet (100 mg total) by mouth daily.          Objective:   Physical Exam BP 122/72 (BP Location: Left Arm, Patient Position: Sitting, Cuff Size: Small)   Pulse (!) 58   Temp 97.8 F (36.6 C) (Oral)   Resp 18   Ht _0  (1.753 m)   Wt 138 lb 6 oz (62.8 kg)   SpO2 97%   BMI 20.43 kg/m  General: Well developed, fragile appearing gentleman, in no physical or emotional distress Neck: No  thyromegaly  HEENT:  Normocephalic . Face symmetric, atraumatic Lungs:  CTA B Normal respiratory effort, no  intercostal retractions, no accessory muscle use. Heart: RRR,  no murmur.  Abdomen:  Not distended, soft, non-tender. No rebound or rigidity.   Lower extremities: no pretibial edema bilaterally.  Good pedal pulses. Skin: Exposed areas without rash. Not pale. Not jaundice Neurologic:  alert & oriented X3.  Speech normal, gait: Assisted by pain.  Needs some help transferring. Strength symmetric and appropriate for age.  Psych: Cognition and judgment appear intact.  Cooperative with normal attention span and concentration.  Behavior appropriate. No anxious or depressed appearing.     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  DEXA 11-2019: Rx Fosamax ED Elevated PSA-prostate nodule:saw urology multiple times, last ~ 2013, declined a bx, declined further eval, aware of risks HOH  PLAN: Here for CPX DM: Continue metformin, Januvia, check A1c and micro Dyslipidemia: On Lipitor and Zetia, labs. Hypothyroidism: On Synthroid, labs DJD: Saw Dr. Marlou Sa regards knee pain. Weight loss: Unclear etiology, he actually feels very well, ROS benign, checking general labs and reassess in 4 months. CAD: On aspirin, controlling CV RF RTC 4 months     This visit occurred during the SARS-CoV-2 public health emergency.  Safety protocols were in place, including screening questions prior to the visit, additional usage of staff PPE, and extensive cleaning of exam room while observing appropriate contact time as indicated for disinfecting solutions.

## 2020-09-06 NOTE — Patient Instructions (Addendum)
GO TO THE LAB : Get the blood work     [place], [place] back for a checkup in 4 to 5 months     "Living will", "[place] of attorney": Advanced care planning  (If you already have a living will or healthcare power of attorney, please bring the copy to be scanned in your chart.)  Advance care planning is a process that supports adults in  understanding and sharing their preferences regarding future medical care.   The patient's preferences are recorded in documents called Advance Directives.    Advanced directives are completed (and can be modified at any time) while the patient is in full mental capacity.   The documentation should be available at all times to the patient, the family and the healthcare providers.  Bring in a copy to be scanned in your chart is an excellent idea and is recommended   This legal documents direct treatment decision making and/or appoint a surrogate to make the decision if the patient is not capable to do so.    Advance directives can be documented in many types of formats,  documents have names such as:  Lliving will  Durable power of attorney for healthcare (healthcare proxy or healthcare power of attorney)  Combined directives  Physician orders for life-sustaining treatment    More information at:  [email or web address]    Fall Prevention in the Home, Adult Falls can cause injuries and can affect people from all age groups. There are many simple things that you can do to make your home safe and to help prevent falls. Ask for help when making these changes, if needed. What actions can I take to prevent falls? General instructions  Use good lighting in all rooms. Replace any light bulbs that burn out.  Turn on lights if it is dark. Use night-lights.  Place frequently used items in easy-to-reach places. Lower the shelves around your home if necessary.  Set up furniture so that  there are clear paths around it. Avoid moving your furniture around.  Remove throw rugs and other tripping hazards from the floor.  Avoid walking on wet floors.  Fix any uneven floor surfaces.  Add color or contrast paint or tape to grab bars and handrails in your home. Place contrasting color strips on the first and last steps of stairways.  When you use a stepladder, make sure that it is completely opened and that the sides are firmly locked. Have someone hold the ladder while you are using it. Do not climb a closed stepladder.  Be aware of any and all pets. What can I do in the bathroom?  Keep the floor dry. Immediately clean up any water that spills onto the floor.  Remove soap buildup in the tub or shower on a regular basis.  Use non-skid mats or decals on the floor of the tub or shower.  Attach bath mats securely with double-sided, non-slip rug tape.  If you need to sit down while you are in the shower, use a plastic, non-slip stool.  Install grab bars by the toilet and in the tub and shower. Do not use towel bars as grab bars.      What can I do in the bedroom?  Make sure that a bedside light is easy to reach.  Do not use oversized bedding that drapes onto the floor.  Have a firm chair that has side arms to use for getting dressed. What can  I do in the kitchen?  Clean up any spills right away.  If you need to reach for something above you, use a sturdy step stool that has a grab bar.  Keep electrical cables out of the way.  Do not use floor polish or wax that makes floors slippery. If you must use wax, make sure that it is non-skid floor wax. What can I do in the stairways?  Do not leave any items on the stairs.  Make sure that you have a light switch at the top of the stairs and the bottom of the stairs. Have them installed if you do not have them.  Make sure that there are handrails on both sides of the stairs. Fix handrails that are broken or loose. Make  sure that handrails are as long as the stairways.  Install non-slip stair treads on all stairs in your home.  Avoid having throw rugs at the top or bottom of stairways, or secure the rugs with carpet tape to prevent them from moving.  Choose a carpet design that does not hide the edge of steps on the stairway.  Check any carpeting to make sure that it is firmly attached to the stairs. Fix any carpet that is loose or worn. What can I do on the outside of my home?  Use bright outdoor lighting.  Regularly repair the edges of walkways and driveways and fix any cracks.  Remove high doorway thresholds.  Trim any shrubbery on the main path into your home.  Regularly check that handrails are securely fastened and in good repair. Both sides of any steps should have handrails.  Install guardrails along the edges of any raised decks or porches.  Clear walkways of debris and clutter, including tools and rocks.  Have leaves, snow, and ice cleared regularly.  Use sand or salt on walkways during winter months.  In the garage, clean up any spills right away, including grease or oil spills. What other actions can I take?  Wear closed-toe shoes that fit well and support your feet. Wear shoes that have rubber soles or low heels.  Use mobility aids as needed, such as canes, walkers, scooters, and crutches.  Review your medicines with your health care provider. Some medicines can cause dizziness or changes in blood pressure, which increase your risk of falling. Talk with your health care provider about other ways that you can decrease your risk of falls. This may include working with a physical therapist or trainer to improve your strength, balance, and endurance. Where to find more information  Centers for Disease Control and Prevention, [facility]: WebmailGuide.co.za  [employer] on Aging: BrainJudge.co.uk Contact a health care provider if:  You are afraid of falling at  home.  You feel weak, drowsy, or dizzy at home.  You fall at home. Summary  There are many simple things that you can do to make your home safe and to help prevent falls.  Ways to make your home safe include removing tripping hazards and installing grab bars in the bathroom.  Ask for help when making these changes in your home. This information is not intended to replace advice given to you by your health care provider. Make sure you discuss any questions you have with your health care provider. Document Revised: 04/03/2017 Document Reviewed: 12/04/2016 Elsevier Patient Education  2021 [employer].

## 2020-09-08 ENCOUNTER — Encounter: Payer: Self-pay | Admitting: Internal Medicine

## 2020-09-08 NOTE — Assessment & Plan Note (Signed)
Here for CPX DM: Continue metformin, Januvia, check A1c and micro Dyslipidemia: On Lipitor and Zetia, labs. Hypothyroidism: On Synthroid, labs DJD: Saw Dr. Marlou Sa regards knee pain. Weight loss: Unclear etiology, he actually feels very well, ROS benign, checking general labs and reassess in 4 months. CAD: On aspirin, controlling CV RF RTC 4 months

## 2020-09-08 NOTE — Assessment & Plan Note (Signed)
--  had covid vax x 3, rec vax #4 Declined any other immunizations again -- CCS: See previous entries, no further screenings -Prostate cancer screening, history of elevated PSA: Desires no further evaluation.  See previous entries. --Diet, exercise discussed   --Labs:CMP, FLP, CBC, A1c, TSH -POA d/w pt  -No history of falls, prevention discussed anyway.

## 2020-10-02 ENCOUNTER — Telehealth: Payer: Self-pay | Admitting: Pharmacist

## 2020-10-02 NOTE — Chronic Care Management (AMB) (Signed)
Chronic Care Management Pharmacy Assistant   Name: Marco [name]  MRN: [id number] DOB: 28-Jan-1934    Reason for Encounter: Disease State Diabetes Mellitus    Recent office visits:  09/06/2020 Marco [name] (PCP) Annual Physical exam. Labs ordered. Follow up in 4 months.   Recent consult visits:  07/20/2020 Marco Duos, MD (Orthopedic surgery) Follow up. Joint injection of right knee 5 mL lidocaine 1 %; 60 mg Sodium Hyaluronate 60 MG/3ML. 07/05/2020 Marco [name] (Otolaryngology) Initial Consult. Start on (CORTISPORIN) 3.5-10,000-1 mg/mL-unit/mL-% otic  06/21/2020 Marco Duos, MD (Orthopedic surgery) Bilateral knee pain. Right knee injection 5 mL lidocaine 1 %; 40 mg methylPREDNISolone acetate 40 MG/ML; 4 mL bupivacaine 0.25. Follow up in 8 weeks.   Hospital visits:  None in previous 6 months  Medications: Outpatient Encounter Medications as of 10/02/2020  Medication Sig Note  . Alcohol Swabs PADS Use to clean finger prior to testing blood sugars   . alendronate (FOSAMAX) 70 MG tablet Take 1 tablet (70 mg total) by mouth once a week. Take with a full glass of water on an empty stomach.   Marland Kitchen aspirin EC 81 MG EC tablet Take 1 tablet (81 mg total) by mouth daily.   Marland Kitchen atorvastatin (LIPITOR) 40 MG tablet Take 1 tablet (40 mg total) by mouth at bedtime.   . Blood Glucose Monitoring Suppl (ACCU-CHEK GUIDE) w/Device KIT Check blood sugars once daily   . diclofenac sodium (VOLTAREN) 1 % GEL Apply 2 g topically 4 (four) times daily. (Patient not taking: No sig reported)   . ezetimibe (ZETIA) 10 MG tablet Take 1 tablet (10 mg total) by mouth daily.   Marland Kitchen glucose blood test strip Check blood sugars once daily   . levothyroxine (SYNTHROID) 75 MCG tablet Take 1 tablet (75 mcg total) by mouth daily before breakfast.   . metFORMIN (GLUCOPHAGE) 500 MG tablet Take 2 tablets (1,000 mg total) by mouth 2 (two) times daily with a meal.   . nitroGLYCERIN (NITROSTAT) 0.4 MG SL tablet Place 1 tablet (0.4 mg  total) under the tongue every 5 (five) minutes x 3 doses as needed for chest pain. (Patient not taking: No sig reported) 03/05/2020: PRN  . sitaGLIPtin (JANUVIA) 100 MG tablet Take 1 tablet (100 mg total) by mouth daily.    No facility-administered encounter medications on file as of 10/02/2020.   Recent Relevant Labs: Lab Results  Component Value Date/Time   HGBA1C 6.9 (H) 09/06/2020 09:35 AM   HGBA1C 6.9 (H) 03/05/2020 09:45 AM   MICROALBUR 1.7 09/06/2020 09:35 AM   MICROALBUR 2.5 (H) 11/01/2019 09:46 AM    Kidney Function Lab Results  Component Value Date/Time   CREATININE 1.51 (H) 09/06/2020 09:35 AM   CREATININE 1.36 (H) 03/05/2020 09:45 AM   CREATININE 1.21 11/01/2019 09:46 AM   GFR 41.91 (L) 09/06/2020 09:35 AM   GFRNONAA >60 11/21/2016 04:07 AM   GFRAA >60 11/21/2016 04:07 AM    . Current antihyperglycemic regimen:  o Metformin 500 mg take 2 tabs twice daily o Januvia 100 mg take 1 tab daily  . What recent interventions/DTPs have been made to improve glycemic control:  o None noted  . Have there been any recent hospitalizations or ED visits since last visit with CPP? No   . Patient hypoglycemic symptoms, including   . Patient hyperglycemic symptoms, including   . How often are you checking your blood sugar?   . What are your blood sugars ranging?  o Fasting:  o Before meals:  o After meals:  o Bedtime:  . During the week, how often does your blood glucose drop below 70?   Marland Kitchen Are you checking your feet daily/regularly?    Adherence Review: Is the patient currently on a STATIN medication? Yes Is the patient currently on ACE/ARB medication? No Does the patient have >5 day gap between last estimated fill dates? No    Star Rating Drugs: Atorvastatin 40 mg  Last filled:08/09/2020 90 DS Metformin 500 mg Last filled:08/09/2020 90 DS Januvia 100 mg Last filled:08/09/2020 90 DS    unable to reach patient.  Lizbeth Bark Clinical Pharmacist  Assistant [phone]

## 2020-10-05 ENCOUNTER — Telehealth: Payer: Self-pay | Admitting: Internal Medicine

## 2020-10-05 NOTE — Telephone Encounter (Signed)
Copied from [place] [id number]. Topic: Medicare AWV >> Oct 05, 2020 10:08 AM Cher Nakai R wrote: Reason for CRM:  No answer unable to leave a message for patient to call back and schedule Medicare Annual Wellness Visit (AWV) in office.   If not able to come in office, please offer to do virtually or by telephone.   Last AWV: 06/03/2019  Please schedule at anytime with [facility] Ad[facility].  If any questions, please contact me at [phone]

## 2020-10-18 NOTE — Progress Notes (Deleted)
Chronic Care Management Pharmacy Note  10/18/2020 Name:  Marco [name] MRN:  [id number] DOB:  1933-11-30  Summary:  Recommendations/Changes made from today's visit:  Plan:  Subjective: Marco [name] is an 87 y.o. year old male who is a primary patient of Paz, Alda Berthold, MD.  The CCM team was consulted for assistance with disease management and care coordination needs.    {CCMTELEPHONEFACETOFACE:21091510} for {CCMINITIALFOLLOWUPCHOICE:21091511} in response to provider referral for pharmacy case management and/or care coordination services.   Consent to Services:  {CCMCONSENTOPTIONS:25074}  Patient Care Team: Colon Branch, MD as PCP - General (Internal Medicine) Roel Cluck, MD as Referring Physician (Ophthalmology) Day, Melvenia Beam, [facility] (Inactive) as Pharmacist (Pharmacist)  Recent office visits:   Recent consult visits:   Hospital visits: {Hospital DC Yes/No:21091515}  Objective:  Lab Results  Component Value Date   CREATININE 1.51 (H) 09/06/2020   CREATININE 1.36 (H) 03/05/2020   CREATININE 1.21 11/01/2019    Lab Results  Component Value Date   HGBA1C 6.9 (H) 09/06/2020   Last diabetic Eye exam:  Lab Results  Component Value Date/Time   HMDIABEYEEXA No Retinopathy 02/23/2020 12:00 AM    Last diabetic Foot exam:  Lab Results  Component Value Date/Time   HMDIABFOOTEX Normal/c/o feet pain 03/08/2015 12:00 AM        Component Value Date/Time   CHOL 126 09/06/2020 0935   TRIG 88.0 09/06/2020 0935   HDL 52.60 09/06/2020 0935   CHOLHDL 2 09/06/2020 0935   VLDL 17.6 09/06/2020 0935   LDLCALC 56 09/06/2020 0935    Hepatic Function Latest Ref Rng & Units 09/06/2020 11/01/2019 10/15/2018  Total Protein 6.0 - 8.3 g/dL 7.1 7.3 7.1  Albumin 3.5 - 5.2 g/dL 4.2 4.2 4.2  AST 0 - 37 U/L '23 31 24  ' ALT 0 - 53 U/L '15 22 14  ' Alk Phosphatase 39 - 117 U/L 86 104 67  Total Bilirubin 0.2 - 1.2 mg/dL 0.7 0.7 0.6    Lab Results  Component Value Date/Time    TSH 3.81 09/06/2020 09:35 AM   TSH 2.21 03/05/2020 09:45 AM   FREET4 0.72 10/01/2017 03:59 PM    CBC Latest Ref Rng & Units 09/06/2020 11/01/2019 04/18/2019  WBC 4.0 - 10.5 K/uL 5.0 5.2 6.3  Hemoglobin 13.0 - 17.0 g/dL 12.0(L) 13.2 13.2  Hematocrit 39.0 - 52.0 % 37.3(L) 40.1 40.6  Platelets 150.0 - 400.0 K/uL 188.0 177.0 209.0    No results found for: VD25OH  Clinical ASCVD: {YES/NO:21197} The ASCVD Risk score Mikey Bussing DC Jr., et al., 2013) failed to calculate for the following reasons:   The 2013 ASCVD risk score is only valid for ages 4 to 48    Other:  DEXA 11/17/2019 History of hip fracture - diagnosed as osteoporosis  Results:   Lumbar spine L1-L4(L3) Femoral neck (FN)  T-score 0.0 LFN: -1.5    Change in BMD from previous DXA test (%) Up 6.2%* Up 1.3%  (*) statistically significant   Assessment: By the [facility] Criteria for diagnosis based on bone density, this patient has Low Bone Density Z Score compares the patients bone density to age, sex, and race matched controls.  Compared to age, sex, and race matched controls, this patient's bone density is Average FRAX 10-year fracture risk calculator: 4.4 % for any major fracture and 1.8 % for hip fracture.   Social History   Tobacco Use  Smoking Status Never  Smokeless Tobacco Never   BP Readings from Last 3 Encounters:  09/06/20 122/72  03/05/20 110/74  11/29/19 109/70   Pulse Readings from Last 3 Encounters:  09/06/20 (!) 58  03/05/20 79  11/29/19 96   Wt Readings from Last 3 Encounters:  09/06/20 138 lb 6 oz (62.8 kg)  03/05/20 145 lb 2 oz (65.8 kg)  11/29/19 142 lb 2 oz (64.5 kg)    Assessment: Review of patient past medical history, allergies, medications, health status, including review of consultants reports, laboratory and other test data, was performed as part of comprehensive evaluation and provision of chronic care management services.   SDOH:  (Social Determinants of Health) assessments and interventions  performed:    CCM Care Plan  No Known Allergies  Medications Reviewed Today     Reviewed by Colon Branch, MD (Physician) on 09/08/20 at [facility] List Status: <None>   Medication Order Taking? Sig Documenting Provider Last Dose Status Informant  Alcohol Swabs PADS [id number] Yes Use to clean finger prior to testing blood sugars Colon Branch, MD Taking Active   alendronate (FOSAMAX) 70 MG tablet [id number] Yes Take 1 tablet (70 mg total) by mouth once a week. Take with a full glass of water on an empty stomach. Colon Branch, MD Taking Active   aspirin EC 81 MG EC tablet [id number] Yes Take 1 tablet (81 mg total) by mouth daily. Murlean Iba, MD Taking Active   atorvastatin (LIPITOR) 40 MG tablet [id number] Yes Take 1 tablet (40 mg total) by mouth at bedtime. Colon Branch, MD Taking Active   Blood Glucose Monitoring Suppl (ACCU-CHEK GUIDE) w/Device Drucie Opitz [id number] Yes Check blood sugars once daily Colon Branch, MD Taking Active   diclofenac sodium (VOLTAREN) 1 % GEL [id number] No Apply 2 g topically 4 (four) times daily.  Patient not taking: No sig reported   Colon Branch, MD Not Taking Active   ezetimibe (ZETIA) 10 MG tablet [id number] Yes Take 1 tablet (10 mg total) by mouth daily. Colon Branch, MD Taking Active   glucose blood test strip [id number] Yes Check blood sugars once daily Colon Branch, MD Taking Active   levothyroxine (SYNTHROID) 75 MCG tablet [id number] Yes Take 1 tablet (75 mcg total) by mouth daily before breakfast. Colon Branch, MD Taking Active   metFORMIN (GLUCOPHAGE) 500 MG tablet [id number] Yes Take 2 tablets (1,000 mg total) by mouth 2 (two) times daily with a meal. Colon Branch, MD Taking Active   nitroGLYCERIN (NITROSTAT) 0.4 MG SL tablet [id number] No Place 1 tablet (0.4 mg total) under the tongue every 5 (five) minutes x 3 doses as needed for chest pain.  Patient not taking: No sig reported   Colon Branch, MD Not Taking Active            Med Note (CANTER, [place] Mar 05, 2020  9:04 AM) PRN  sitaGLIPtin (JANUVIA) 100 MG tablet [id number] Yes Take 1 tablet (100 mg total) by mouth daily. Colon Branch, MD Taking Active             Patient Active Problem List   Diagnosis Date Noted   CAD (coronary artery disease) 03/06/2020   Hypothyroidism 12/02/2017   Chronic pain of right knee 10/13/2017   Chest pain 11/19/2016   Amaurosis fugax 12/12/2015   PCP NOTES >>> 03/08/2015   Osteoporosis 09/15/2014   Primary osteoarthritis of both knees 03/16/2014   Hearing difficulty 10/20/2013   DJD (degenerative joint disease) 08/25/2013   Neck mass  04/25/2013   Hyperlipidemia 04/25/2013   Erectile dysfunction 12/26/2011   Annual physical exam 08/22/2011   Prostate nodule 08/22/2011   Diabetes mellitus ([place]) 05/07/2011    Immunization History  Administered Date(s) Administered   PFIZER(Purple Top)SARS-COV-2 Vaccination 05/20/2019, 06/10/2019, 02/02/2020    Conditions to be addressed/monitored: {CCM ASSESSMENT DISEASE OPTIONS:25047}  There are no care plans that you recently modified to display for this patient.   Medication Assistance: {MEDASSISTANCEINFO:25044}  Patient's preferred pharmacy is:  [id number] - [place], [place] - [address] AT [place] [address] [place] [address] Phone: [phone] Fax: [phone]  [facility]  ([place], [place], [address] [place], [address] [place] [address] Phone: [phone] Fax: [phone]  Uses pill box? {Yes or If no, why not?:20788} Pt endorses ***% compliance  Follow Up:  {FOLLOWUP:24991}  Plan: {CM FOLLOW UP PLAN:25073}  SIG***

## 2020-10-19 ENCOUNTER — Telehealth: Payer: Self-pay | Admitting: Internal Medicine

## 2020-10-19 ENCOUNTER — Telehealth: Payer: Medicare Other

## 2020-10-19 NOTE — Telephone Encounter (Signed)
Patient brought in form for DMV He is insisting on today and is upset it will take a few days...   I placed form into bin for paz to sign

## 2020-10-19 NOTE — Telephone Encounter (Signed)
Patient would like it mailed to him  since its hard for him to get a ride to the office

## 2020-10-19 NOTE — Telephone Encounter (Signed)
Form placed in mail as requested. Copy sent for scanning.

## 2020-10-19 NOTE — Telephone Encounter (Signed)
Form completed, placed in PCP red folder to be signed.  

## 2020-10-22 ENCOUNTER — Telehealth: Payer: Self-pay | Admitting: Pharmacist

## 2020-10-22 NOTE — Telephone Encounter (Signed)
LMOM informing Pt of PCP decision not to restart him on this medication.

## 2020-10-22 NOTE — Telephone Encounter (Addendum)
Sildenafil was prescribed many years ago, since then he had chest pain, indeterminate stress test, he declined cardiac catheterization. It is not an appropriate medication for him.   Decline request

## 2020-10-22 NOTE — Telephone Encounter (Signed)
Please advise 

## 2020-10-22 NOTE — Telephone Encounter (Signed)
Patient is requesting a printed Rx to be mailed to him for #30 sildenafil. Not currently on med list (looks like was removed in past due to patient preference).

## 2020-10-22 NOTE — Telephone Encounter (Signed)
I will discuss reasons for not taking sildenafil mentioned by Dr Larose Kells at upcoming phone visit 10/24/2020.

## 2020-10-22 NOTE — Telephone Encounter (Signed)
Spoke with pt and explained why medication was declined. He would still like a cal back because he says he has not had chest pain in over 5 years.

## 2020-10-24 ENCOUNTER — Ambulatory Visit (INDEPENDENT_AMBULATORY_CARE_PROVIDER_SITE_OTHER): Payer: Medicare Other | Admitting: Pharmacist

## 2020-10-24 DIAGNOSIS — E1169 Type 2 diabetes mellitus with other specified complication: Secondary | ICD-10-CM

## 2020-10-24 DIAGNOSIS — E785 Hyperlipidemia, unspecified: Secondary | ICD-10-CM | POA: Diagnosis not present

## 2020-10-24 DIAGNOSIS — E039 Hypothyroidism, unspecified: Secondary | ICD-10-CM

## 2020-10-24 DIAGNOSIS — I2510 Atherosclerotic heart disease of native coronary artery without angina pectoris: Secondary | ICD-10-CM | POA: Diagnosis not present

## 2020-10-24 DIAGNOSIS — M810 Age-related osteoporosis without current pathological fracture: Secondary | ICD-10-CM

## 2020-10-24 DIAGNOSIS — N1832 Chronic kidney disease, stage 3b: Secondary | ICD-10-CM

## 2020-10-24 NOTE — Telephone Encounter (Signed)
Spoke with patient for CCM today - he reported that he has been taking sildenafil as needed. He states he received sildenafil from a physician when he was in [place] in the Fall of 2021.  I did let him know that at this time Dr Larose Kells does not feel comfortable prescribing silfenafil until further cardiac testing due to his history of chest pain and CAD.

## 2020-10-24 NOTE — Telephone Encounter (Signed)
Noted, thx.

## 2020-10-26 NOTE — Chronic Care Management (AMB) (Signed)
Chronic Care Management Pharmacy Note  10/28/2020 Name:  Marco [name] MRN:  [id number] DOB:  1933/06/27  Summary: Recent decrease in eGFR to 41 mL/min  Recommendations/Changes made from today's visit: Patient is taking lower dose of metformin already - only 555m bid. Consider lower dose of Januvia 553mdaily based on current eGFR or reassess eGFR to see if improved with increase in hydration.   Plan: Message sent to PCP regarding either lowering dose of Januvia or rechecking renal function. Dr PaLarose Kellsecommended lowering dose of Januvia to 504maily. Patient was notified and requested new Rx to be sent to pharmacy.   Subjective: Marco [name] an [age]. year old male who is a primary patient of Paz, JosAlda BertholdD.  The CCM team was consulted for assistance with disease management and care coordination needs.    Engaged with patient by telephone for follow up visit in response to provider referral for pharmacy case management and/or care coordination services.   Consent to Services:  The patient was given information about Chronic Care Management services, agreed to services, and gave verbal consent prior to initiation of services.  Please see initial visit note for detailed documentation.   Patient Care Team: PazColon BranchD as PCP - General (Internal Medicine) TepRoel CluckD as Referring Physician (Ophthalmology) EckCherre RobinsharmD (Pharmacist)  Recent office visits: 09/06/2020 Dr. PazLarose KellsCP) Annual Physical exam. Labs ordered. No medicaiton changes   Recent consult visits:  07/20/2020 Dr DeaMarlou Sarthopedic surgery) Follow up. Joint injection of right knee of lidocaine 1 %/ Hyaluronate 60 MG/3ML. 07/05/2020 Dr. DavHassell Donetolaryngology) Initial Consult. Start CORTISPORIN otic  06/21/2020 Dr. DeaMarlou SaD (Orthopedic surgery) Bilateral knee pain. Right knee injection of  lidocaine 1 %.  methylPREDNISolone/ bupivacaine Follow up in 8 weeks.  Hospital visits: None  in previous 6 months  Objective:  Lab Results  Component Value Date   CREATININE 1.51 (H) 09/06/2020   CREATININE 1.36 (H) 03/05/2020   CREATININE 1.21 11/01/2019    Lab Results  Component Value Date   HGBA1C 6.9 (H) 09/06/2020   Last diabetic Eye exam:  Lab Results  Component Value Date/Time   HMDIABEYEEXA No Retinopathy 02/23/2020 12:00 AM    Last diabetic Foot exam:  Lab Results  Component Value Date/Time   HMDIABFOOTEX Normal/c/o feet pain 03/08/2015 12:00 AM        Component Value Date/Time   CHOL 126 09/06/2020 0935   TRIG 88.0 09/06/2020 0935   HDL 52.60 09/06/2020 0935   CHOLHDL 2 09/06/2020 0935   VLDL 17.6 09/06/2020 0935   LDLCALC 56 09/06/2020 0935    Hepatic Function Latest Ref Rng & Units 09/06/2020 11/01/2019 10/15/2018  Total Protein 6.0 - 8.3 g/dL 7.1 7.3 7.1  Albumin 3.5 - 5.2 g/dL 4.2 4.2 4.2  AST 0 - 37 U/L _0 ALT 0 - 53 U/L _1 Alk Phosphatase 39 - 117 U/L 86 104 67  Total Bilirubin 0.2 - 1.2 mg/dL 0.7 0.7 0.6    Lab Results  Component Value Date/Time   TSH 3.81 09/06/2020 09:35 AM   TSH 2.21 03/05/2020 09:45 AM   FREET4 0.72 10/01/2017 03:59 PM    CBC Latest Ref Rng & Units 09/06/2020 11/01/2019 04/18/2019  WBC 4.0 - 10.5 K/uL 5.0 5.2 6.3  Hemoglobin 13.0 - 17.0 g/dL 12.0(L) 13.2 13.2  Hematocrit 39.0 - 52.0 % 37.3(L) 40.1 40.6  Platelets 150.0 - 400.0 K/uL 188.0 177.0 209.0  No results found for: VD25OH  Clinical ASCVD: Yes  The ASCVD Risk score Mikey Bussing DC Jr., et al., 2013) failed to calculate for the following reasons:   The 2013 ASCVD risk score is only valid for ages 56 to 64    Other:  DEXA 11/17/2019 Lowest T-Score at right femoral neck = -1.5 (Compared to 09/07/2013 score was -1.9) Patient classified as osteoporosis due to h/o of hip fracture.   Social History   Tobacco Use  Smoking Status Never  Smokeless Tobacco Never   BP Readings from Last 3 Encounters:  09/06/20 122/72  03/05/20 110/74  11/29/19  109/70   Pulse Readings from Last 3 Encounters:  09/06/20 (!) 58  03/05/20 79  11/29/19 96   Wt Readings from Last 3 Encounters:  09/06/20 138 lb 6 oz (62.8 kg)  03/05/20 145 lb 2 oz (65.8 kg)  11/29/19 142 lb 2 oz (64.5 kg)    Assessment: Review of patient past medical history, allergies, medications, health status, including review of consultants reports, laboratory and other test data, was performed as part of comprehensive evaluation and provision of chronic care management services.   SDOH:  (Social Determinants of Health) assessments and interventions performed:  SDOH Interventions    Flowsheet Row Most Recent Value  SDOH Interventions   Financial Strain Interventions Intervention Not Indicated  Physical Activity Interventions Patient Refused       CCM Care Plan  No Known Allergies  Medications Reviewed Today     Reviewed by [name], PharmD (Pharmacist) on 10/25/20 at 10  Med List Status: <None>   Medication Order Taking? Sig Documenting Provider Last Dose Status Informant  Alcohol Swabs PADS [id number] Yes Use to clean finger prior to testing blood sugars Marco Branch, MD Taking Active   alendronate (FOSAMAX) 70 MG tablet [id number] Yes Take 1 tablet (70 mg total) by mouth once a week. Take with a full glass of water on an empty stomach. Marco Branch, MD Taking Active   aspirin EC 81 MG EC tablet [id number] Yes Take 1 tablet (81 mg total) by mouth daily. Murlean Iba, MD Taking Active   atorvastatin (LIPITOR) 40 MG tablet [id number] Yes Take 1 tablet (40 mg total) by mouth at bedtime. Marco Branch, MD Taking Active   Blood Glucose Monitoring Suppl (ACCU-CHEK GUIDE) w/Device Drucie Opitz [id number] Yes Check blood sugars once daily Marco Branch, MD Taking Active   Calcium Carb-Cholecalciferol (CALCIUM 500 + D) 500-125 MG-UNIT TABS [id number] Yes Take 1 tablet by mouth daily. [provider] Taking Active   ezetimibe (ZETIA) 10 MG tablet [id number] Yes Take 1 tablet  (10 mg total) by mouth daily. Marco Branch, MD Taking Active   glucose blood test strip [id number] Yes Check blood sugars once daily Marco Branch, MD Taking Active   levothyroxine (SYNTHROID) 75 MCG tablet [id number] Yes Take 1 tablet (75 mcg total) by mouth daily before breakfast. Marco Branch, MD Taking Active   metFORMIN (GLUCOPHAGE) 500 MG tablet [id number] Yes Take 2 tablets (1,000 mg total) by mouth 2 (two) times daily with a meal.  Patient taking differently: Take 500 mg by mouth 2 (two) times daily with a meal.   Marco Branch, MD Taking Active   nitroGLYCERIN (NITROSTAT) 0.4 MG SL tablet [id number] No Place 1 tablet (0.4 mg total) under the tongue every 5 (five) minutes x 3 doses as needed for chest pain.  Patient not taking: No sig reported   Marco Branch, MD  Not Taking Active            Med Note Alinda Money, [place] Mar 05, 2020  9:04 AM) PRN  OVER THE COUNTER MEDICATION [id number] Yes Taking laxative from [place] as needed [provider] Taking Active   sitaGLIPtin (JANUVIA) 100 MG tablet [id number] Yes Take 1 tablet (100 mg total) by mouth daily. Marco Branch, MD Taking Active   vitamin B-12 (CYANOCOBALAMIN) 1000 MCG tablet [id number] Yes Take 1,000 mcg by mouth daily. [provider] Taking Active             Patient Active Problem List   Diagnosis Date Noted   CAD (coronary artery disease) 03/06/2020   Hypothyroidism 12/02/2017   Chronic pain of right knee 10/13/2017   Chest pain 11/19/2016   Amaurosis fugax 12/12/2015   PCP NOTES >>> 03/08/2015   Osteoporosis 09/15/2014   Primary osteoarthritis of both knees 03/16/2014   Hearing difficulty 10/20/2013   DJD (degenerative joint disease) 08/25/2013   Neck mass 04/25/2013   Hyperlipidemia 04/25/2013   Erectile dysfunction 12/26/2011   Annual physical exam 08/22/2011   Prostate nodule 08/22/2011   Diabetes mellitus ([place]) 05/07/2011    Immunization History  Administered Date(s) Administered   PFIZER(Purple  Top)SARS-COV-2 Vaccination 05/20/2019, 06/10/2019, 02/02/2020    Conditions to be addressed/monitored: CAD, HLD, DMII, and hypothyroidism; CKD; constipation; osteoporosis  Care Plan : General Pharmacy (Adult)  Updates made by [name], PHARMD since 10/28/2020 12:00 AM     Problem: type 2 DM; hyperlipidemia; CAD; ED; osteoporosis; hypothyroidism; CKD; OA / DJD; constipation   Priority: High     Long-Range Goal: Medication and Chronic Care Management   Start Date: 10/24/2020  This Visit's Progress: On track  Priority: High  Note:   Current Barriers:  Does not contact provider office for questions/concerns Patient is taking medications from [place] that were not prescribed by PCP Changing renal function with need to adjust medications.  Pharmacist Clinical Goal(s):  Over the next 90 days, patient will achieve adherence to monitoring guidelines and medication adherence to achieve therapeutic efficacy maintain control of hyperlipidemia and type 2 DM as evidenced by maintaining LDL <70 and A1c < 7.0%  adhere to prescribed medication regimen as evidenced by fill history and pill count  through collaboration with PharmD and provider.  Monitor renal function and adjust medication therapy as needed  Interventions: 1:1 collaboration with Marco Branch, MD regarding development and update of comprehensive plan of care as evidenced by provider attestation and co-signature Inter-disciplinary care team collaboration (see longitudinal plan of care) Comprehensive medication review performed; medication list updated in electronic medical record  Hyperlipidemia Controlled;  LDL goal < 70 Tolerating regimen well without side effects however patient was unsure why he needed to take 2 medications for his cholesterol  Current regimen:  Atorvastatin 32m daily  Ezetimibe 142mdaily Interventions: Reviewed LDL goal Emphasized the importance of taking both cholesterol medications daily Discussed  that each of his cholesterol medications work differently but together to help lower LDL cholesterol and reduce heart disease Maintain current cholesterol medication regimen.   Diabetes Controlled; A1c goal <7%  Lab Results  Component Value Date   CREATININE 1.51 (H) 09/06/2020   CREATININE 1.36 (H) 03/05/2020   CREATININE 1.21 11/01/2019  Last eGFR = 41.9 mL/min Recent home BG readings: 120, 140, 114 Current regimen:  Metformin 50074m take 2 tablets twice daily (patient reports he is only taking 1 tablet twice a day)  Januvia 100m30m  daily Interventions: Emphasized the importance of med adherence Discussed A1c goals Coordinated with primary care provider regarding doses of metformin and Januvia - based on last eGFR recommended lowering dose of Januvia to 55m daily and to continue the lower dose of metformin 5024mtake 1 tablet twice a day. Continue to check BG once daily, document, and provide at future appointments Reminded to have yearly eye exam  Hypothyroidism Controlled; Goal: maintain TSH within normal limits  Lab Results  Component Value Date   TSH 3.81 09/06/2020  Current regimen:  Levothyroxine 7564mdaily Interventions: Emphasized the importance of med adherence Maintain hypothyroid medication regimen.   Osteoporosis Goal: reduce risk of fracture due to osteoporosis  History of hip fracture DEXA 11/17/2019: Lowest T-Score was -1.5 at right femoral neck  Compared to previous T-Score from 09/07/2013 of -1.9 has improved.  Current regimen:  Alendronate 27m54mke 1 tablet once a week - take in morning on empty stomach at least 30 minutes before food or medications; Take with at least 4 ounces of water. Do not lie down or recline for 30 minutes after taking alendronate.  Calcium 500mg44mly/Vitamin D 5000 units daily  Interventions: Discussed fall prevention Ensure daily intake of at least 1200mg 74malcium daily through diet and/or supplementation Ensure daily  intake of at least 1000 units of vitamin D through supplementation  Maintain osteoporosis medication regimen  Constipation:  Patient is taking a laxative from [place] unable to tell me the name. Initially he states he was taking daily but then said only took as needed about every other day.  Patient reports daily BM and denied straining.  He did state that sometimes he was not able to control his BM.  Current Regimen:  Patient was unable to provider name of OTC medication he was taking.  Interventions:  Patient will try to get name of medication from [place] taking Recommended he try daily fiber supplement Provided information on how to improve BM with adequate water and fiber intake and on OTC medications to help regulate BM. If not improving in 2 weeks, he should see PCP.   Medication management Pharmacist Clinical Goal(s): Over the next 90 days, patient will work with PharmD and providers to achieve optimal medication adherence Current pharmacy: [facility] Interventions Comprehensive medication review performed. Continue current medication management strategy  Patient Goals/Self-Care Activities Over the next 90 days, patient will:  take medications as prescribed, check glucose daily , document, and provide at future appointments, and report to PCP if BMs not improved.  Follow Up Plan:  CCM team will check in regarding BG in 1 month  F/U with clinical pharmacist in 3 months        Medication Assistance: None required.  Patient affirms current coverage meets needs.  [id number] [place] [id number]: [phone]  ([facility] - [id number]: [phone] Up:  Patient agrees to Care Plan and Follow-up.  Plan: Chronic Care Management team will f/u BG reading in 1 month; Clinical pharmacist will follow up in 3 months, unless needed sooner.   Tammy Cherre RobinsmD Clinical Pharmacist [phone]

## 2020-10-28 NOTE — Patient Instructions (Addendum)
Marco [name],   It was a pleasure speaking with you today. Please feel free to contact me if you have any questions or concerns. Below is information regarding your health goals.   Keep up the good work!  Marco [name], PharmD Clinical Pharmacist [facility] [phone]  PATIENT GOALS:  Goals Addressed             This Visit's Progress    Chronic Care Management Pharmacy Care Plan   On track    CARE PLAN ENTRY (see longitudinal plan of care for additional care plan information)  Current Barriers:  Chronic Disease Management support, education, and care coordination needs related to Diabetes, Hyperlipidemia, Hypothyroidism, Osteoporosis   Hyperlipidemia Lab Results  Component Value Date/Time   LDLCALC 56 09/06/2020 09:35 AM  Pharmacist Clinical Goal(s): Over the next 90 days, patient will work with PharmD and providers to maintain LDL goal < 70 Current regimen:  Atorvastatin 40mg  daily  Ezetimibe 10mg  daily Interventions: Emphasized the importance of taking both cholesterol medications daily Discussed that each of his cholesterol medications work differently but together to help lower LDL cholesterol and prevent heart disease Patient self care activities - Over the next 90 days, patient will: Maintain current cholesterol medication regimen.   Diabetes Lab Results  Component Value Date/Time   HGBA1C 6.9 (H) 09/06/2020 09:35 AM   HGBA1C 6.9 (H) 03/05/2020 09:45 AM  Pharmacist Clinical Goal(s): Over the next 90 days, patient will work with PharmD and providers to maintain A1c goal <7% Current regimen:  Metformin 500mg  - take 2 tablets twice daily (patient reports he is only taking 1 tablet twice a day)  Januvia 100mg  daily Interventions: Emphasized the importance of med adherence Discussed A1c goals Coordinated with primary care provider regarding doses of metformin and Januvia Patient self care activities - Over the next 90 days,  patient will: Check blood sugar once daily, document, and provide at future appointments; Call office if you have readings less than 80 or over 200. Contact provider with any episodes of hypoglycemia Lower dose of Januvia to 50mg  daily (take 1/2 tablet daily for now, we are sending in new prescription for lower dose to [facility])  Continue the lower dose of metformin 500mg  take 1 tablet twice a day. Remember to have yearly eye exam  Hypothyroidism Pharmacist Clinical Goal(s) Over the next 90 days, patient will work with PharmD and providers to maintain TSH within normal limits Current regimen:  Levothyroxine 29mcg daily Interventions: Emphasized the importance of med adherence Patient self care activities - Over the next 90 days, patient will: Maintain hypothyroid medication regimen.   Osteoporosis Pharmacist Clinical Goal(s) Over the next 90 days, patient will work with PharmD and providers to reduce risk of fracture due to osteoporosis  Current regimen:  Alendronate 70mg  take 1 tablet once a week - take in morning on empty stomach at least 30 minutes before food or medications; Take with at least 4 ounces of water. Do not lie down or recline for 30 minutes after taking alendronate.  Calcium 500mg  daily/Vitamin D 5000 units daily  Interventions: Consider intake of 1200mg  of calcium daily through diet and/or supplementation Consider intake of [phone] units of vitamin D through supplementation  Discussed fall prevention Patient self care activities - Over the next 90 days, patient will: Ensure daily intake of at least 1200mg  of calcium daily through diet and/or supplementation Ensure daily intake of at least 1000 units of vitamin D through supplementation  Maintain osteoporosis medication  regimen  Medication management Pharmacist Clinical Goal(s): Over the next 90 days, patient will work with PharmD and providers to achieve optimal medication adherence Current pharmacy: [facility] Order Interventions Comprehensive medication review performed. Continue current medication management strategy Sending patient OTC bowel regimen for to treat and prevent constipation Patient self care activities - Over the next 90 days, patient will: Focus on medication adherence by filling and taking medications appropriately  Take medications as prescribed Report any questions or concerns to PharmD and/or provider(s) Included information about ways to improve bowel movements;  if not helping, please contact Dr Larose Kells for further evaluation  Please see past updates related to this goal by clicking on the "Past Updates" button in the selected goal           Patient verbalizes understanding of instructions provided today and agrees to view in [place].   Follow up: Chronic Care Management team will check in regarding blood glucose in 1 month; Clinical pharmacist will follow up in 3 months, unless needed sooner.    Constipation, Adult Constipation is when a person has trouble pooping (having a bowel movement). When you have this condition, you may poop fewer than 3 times a week. Your poop (stool) may also be dry, hard, or bigger than normal. Follow these instructions at home: Eating and drinking  Eat foods that have a lot of fiber, such as: Fresh fruits and vegetables. Whole grains. Beans. Eat less of foods that are low in fiber and high in fat and sugar, such as: [place] fries. Hamburgers. Cookies. Candy. Soda. Drink enough fluid to keep your pee (urine) pale yellow.  General instructions Exercise regularly or as told by your doctor. Try to do 150 minutes of exercise each week. Go to the restroom when you feel like you need to poop. Do not hold it in. Take over-the-counter and prescription medicines only as told by your doctor. These include any fiber supplements. When you poop: Do deep breathing while relaxing your lower belly (abdomen). Relax your pelvic floor. The  pelvic floor is a group of muscles that support the rectum, bladder, and intestines (as well as the uterus in women). Watch your condition for any changes. Tell your doctor if you notice any. Keep all follow-up visits as told by your doctor. This is important. Contact a doctor if: You have pain that gets worse. You have a fever. You have not pooped for 4 days. You vomit. You are not hungry. You lose weight. You are bleeding from the opening of the butt (anus). You have thin, pencil-like poop. Get help right away if: You have a fever, and your symptoms suddenly get worse. You leak poop or have blood in your poop. Your belly feels hard or bigger than normal (bloated). You have very bad belly pain. You feel dizzy or you faint. Summary Constipation is when a person poops fewer than 3 times a week, has trouble pooping, or has poop that is dry, hard, or bigger than normal. Eat foods that have a lot of fiber. Drink enough fluid to keep your pee (urine) pale yellow. Over counter options to treat constipation:  Miralax - 1 capful = 17 gram mixed with liquid once a day (can use daily to prevent constipation or as needed to treat constipation) If Miralax is not helpful you can try Senakot S (stool softner and vegetable laxitive) take 1 or 2 tablets if needed up to twice a day  If Senkot dose not help you can take Dulcolax /  bisacodyl laxative - 1 or 2 tablet as needed (if still on bowel movement - contact provider)  This information is not intended to replace advice given to you by your health care provider. Make sure you discuss any questions you have with your healthcare provider. Document Revised: 03/09/2019 Document Reviewed: 03/09/2019 Elsevier Patient Education  [place].

## 2020-10-31 ENCOUNTER — Other Ambulatory Visit: Payer: Self-pay

## 2020-10-31 MED ORDER — METFORMIN HCL 500 MG PO TABS: 1000.0000 mg | ORAL_TABLET | Freq: Two times a day (BID) | ORAL | 1 refills | Status: DC

## 2020-10-31 MED ORDER — SITAGLIPTIN PHOSPHATE 100 MG PO TABS: 100.0000 mg | ORAL_TABLET | Freq: Every day | ORAL | 1 refills | Status: DC

## 2020-11-07 ENCOUNTER — Other Ambulatory Visit: Payer: Self-pay

## 2020-11-13 ENCOUNTER — Telehealth: Payer: Medicare Other | Admitting: Internal Medicine

## 2020-11-13 DIAGNOSIS — R269 Unspecified abnormalities of gait and mobility: Secondary | ICD-10-CM

## 2020-11-13 NOTE — Telephone Encounter (Signed)
Caller: Marco [name] Dance movement psychotherapist from [facility]) Call back # [phone]  Marco [name] states patient qualify for a walking cane and would like prescription to be sent to [facility] phone number [phone]

## 2020-11-13 NOTE — Telephone Encounter (Signed)
Pt already walks w/ a cane. I tried calling [facility] back but was on hold for > 10 mins.

## 2020-11-20 NOTE — Telephone Encounter (Signed)
Patient called back stating he never got his rx for walking cane. Patient has a cane however it broke as the reason for the new order.   Please send rx for cane per insurance.

## 2020-11-21 NOTE — Addendum Note (Signed)
Addended byDamita Dunnings D on: 11/21/2020 07:54 AM   Modules accepted: Orders

## 2020-11-21 NOTE — Telephone Encounter (Signed)
[employer] Navigator called and said that original location did not have a cane available. Please send new order to [facility] on [address] phone is [phone] and fax is [phone]. They only have 4 post so new rx will need say its for a four post b/c its the only one they carry.

## 2020-11-21 NOTE — Telephone Encounter (Signed)
Order faxed.

## 2020-11-21 NOTE — Telephone Encounter (Signed)
Script printed, will contact [facility] for fax number once they open.

## 2020-11-21 NOTE — Telephone Encounter (Signed)
Order faxed to [facility] at [phone].

## 2020-11-22 ENCOUNTER — Other Ambulatory Visit: Payer: Self-pay | Admitting: Internal Medicine

## 2020-12-03 NOTE — Addendum Note (Signed)
Addended byDamita Dunnings D on: 12/03/2020 12:51 PM   Modules accepted: Orders

## 2020-12-03 NOTE — Telephone Encounter (Signed)
Order faxed to number provided

## 2020-12-03 NOTE — Telephone Encounter (Signed)
Pt called stating the order needs to be sent again to [facility] on [address] for the the walking cane.  Please send to [facility] on [address] [facility] phone is [phone] and fax is [phone].

## 2020-12-03 NOTE — Telephone Encounter (Signed)
Pt has called back and inquired about the cane referral. I have informed him that the referral/ order has been faxed ob 11/21/20.

## 2020-12-07 ENCOUNTER — Telehealth: Payer: Self-pay | Admitting: Pharmacist

## 2020-12-07 NOTE — Chronic Care Management (AMB) (Signed)
Chronic Care Management Pharmacy Assistant   Name: Marco [name]  MRN: [id number] DOB: 10/21/1933   Reason for Encounter: Disease State Diabetes Mellitus    Recent office visits:  None noted  Recent consult visits:  None noted  Hospital visits:  None in previous 6 months  Medications: Outpatient Encounter Medications as of 12/07/2020  Medication Sig Note   Alcohol Swabs PADS Use to clean finger prior to testing blood sugars    alendronate (FOSAMAX) 70 MG tablet Take 1 tablet (70 mg total) by mouth once a week. Take with a full glass of water on an empty stomach.    aspirin EC 81 MG EC tablet Take 1 tablet (81 mg total) by mouth daily.    atorvastatin (LIPITOR) 40 MG tablet Take 1 tablet (40 mg total) by mouth at bedtime.    Blood Glucose Monitoring Suppl (ACCU-CHEK GUIDE) w/Device KIT Check blood sugars once daily    Calcium Carb-Cholecalciferol (CALCIUM 500 + D) 500-125 MG-UNIT TABS Take 1 tablet by mouth daily.    ezetimibe (ZETIA) 10 MG tablet Take 1 tablet (10 mg total) by mouth daily.    glucose blood test strip Check blood sugars once daily    levothyroxine (SYNTHROID) 75 MCG tablet Take 1 tablet (75 mcg total) by mouth daily before breakfast.    metFORMIN (GLUCOPHAGE) 500 MG tablet Take 1 tablet (500 mg total) by mouth 2 (two) times daily with a meal.    nitroGLYCERIN (NITROSTAT) 0.4 MG SL tablet Place 1 tablet (0.4 mg total) under the tongue every 5 (five) minutes x 3 doses as needed for chest pain. (Patient not taking: No sig reported) 03/05/2020: PRN   OVER THE COUNTER MEDICATION Taking laxative from [place] as needed    sitaGLIPtin (JANUVIA) 100 MG tablet Take 1 tablet (100 mg total) by mouth daily.    vitamin B-12 (CYANOCOBALAMIN) 1000 MCG tablet Take 1,000 mcg by mouth daily.    No facility-administered encounter medications on file as of 12/07/2020.   Current antihyperglycemic regimen:  Metformin 500 mg take 1 tab twice daily Januvia 100 mg take 1 tab  daily  Unsuccessfully reached patient 3x to complete diabetic assessment call. I have left 3 voicemail's to return phone call.   Star Rating Drugs: Metformin 500 mg Last filled:10/31/20 90 DS Januvia 100 mg Last filled:10/31/20 90 DS Atorvastatin 40 mg Last filled:08/09/20 90 DS  Myriam Estrada, RMA Health Concierge    

## 2020-12-12 ENCOUNTER — Other Ambulatory Visit: Payer: Self-pay | Admitting: Internal Medicine

## 2020-12-21 ENCOUNTER — Telehealth: Payer: Self-pay | Admitting: Internal Medicine

## 2020-12-21 NOTE — Telephone Encounter (Signed)
PT called requesting for a controlled solution for his glucose meter. He stated that it was not sent to him with his meter. I could not find on his list what he was referring about. Please advise.

## 2020-12-24 MED ORDER — ACCU-CHEK GUIDE CONTROL VI LIQD: 3 refills | Status: DC

## 2020-12-24 NOTE — Telephone Encounter (Signed)
Control solution sent to [facility].

## 2021-01-28 ENCOUNTER — Ambulatory Visit (INDEPENDENT_AMBULATORY_CARE_PROVIDER_SITE_OTHER): Payer: Medicare Other | Admitting: Pharmacist

## 2021-01-28 DIAGNOSIS — I2510 Atherosclerotic heart disease of native coronary artery without angina pectoris: Secondary | ICD-10-CM

## 2021-01-28 DIAGNOSIS — E785 Hyperlipidemia, unspecified: Secondary | ICD-10-CM

## 2021-01-28 DIAGNOSIS — M810 Age-related osteoporosis without current pathological fracture: Secondary | ICD-10-CM

## 2021-01-28 DIAGNOSIS — E1169 Type 2 diabetes mellitus with other specified complication: Secondary | ICD-10-CM

## 2021-01-28 DIAGNOSIS — N1832 Chronic kidney disease, stage 3b: Secondary | ICD-10-CM

## 2021-01-29 NOTE — Patient Instructions (Signed)
Visit Information  PATIENT GOALS:  Goals Addressed             This Visit's Progress    Chronic Care Management Pharmacy Care Plan   On track    [place] (see longitudinal plan of care for additional care plan information)  Current Barriers:  Chronic Disease Management support, education, and care coordination needs related to Diabetes, Hyperlipidemia, Hypothyroidism, Osteoporosis   Hyperlipidemia Lab Results  Component Value Date/Time   LDLCALC 56 09/06/2020 09:35 AM  Pharmacist Clinical Goal(s): Over the next 90 days, patient will work with PharmD and providers to maintain LDL goal < 70 Current regimen:  Atorvastatin 40mg  daily  Ezetimibe 10mg  daily Interventions: Emphasized the importance of taking both cholesterol medications daily Discussed that each of his cholesterol medications work differently but together to help lower LDL cholesterol and prevent heart disease Patient self care activities - Over the next 90 days, patient will: Maintain current cholesterol medication regimen.   Diabetes Lab Results  Component Value Date/Time   HGBA1C 6.9 (H) 09/06/2020 09:35 AM   HGBA1C 6.9 (H) 03/05/2020 09:45 AM  Pharmacist Clinical Goal(s): Over the next 90 days, patient will work with PharmD and providers to maintain A1c goal <7% Current regimen:  Metformin 500mg  - take 1 tablets twice daily  Januvia 50mg  daily Interventions: Emphasized the importance of med adherence Discussed A1c goalsa Patient self care activities - Over the next 90 days, patient will: Check blood sugar once daily, document, and provide at future appointments; Call office if you have readings less than 80 or over 200. Contact provider with any episodes of hypoglycemia Remember to have yearly eye exam  Hypothyroidism Pharmacist Clinical Goal(s) Over the next 90 days, patient will work with PharmD and providers to maintain TSH within normal limits Current regimen:  Levothyroxine 29mcg  daily Interventions: Emphasized the importance of med adherence Patient self care activities - Over the next 90 days, patient will: Maintain hypothyroid medication regimen.   Osteoporosis Pharmacist Clinical Goal(s) Over the next 90 days, patient will work with PharmD and providers to reduce risk of fracture due to osteoporosis  Current regimen:  Alendronate 70mg  take 1 tablet once a week - take in morning on empty stomach at least 30 minutes before food or medications; Take with at least 4 ounces of water. Do not lie down or recline for 30 minutes after taking alendronate.  Calcium 500mg  daily/Vitamin D 5000 units daily  Interventions: Consider intake of 1200mg  of calcium daily through diet and/or supplementation Consider intake of [phone] units of vitamin D through supplementation  Discussed fall prevention Patient self care activities - Over the next 90 days, patient will: Ensure daily intake of at least 1200mg  of calcium daily through diet and/or supplementation Ensure daily intake of at least 1000 units of vitamin D through supplementation  Maintain osteoporosis medication regimen  Medication management Pharmacist Clinical Goal(s): Over the next 90 days, patient will work with PharmD and providers to achieve optimal medication adherence Current pharmacy: [facility] Order Interventions Comprehensive medication review performed. Continue current medication management strategy Patient self care activities - Over the next 90 days, patient will: Focus on medication adherence by filling and taking medications appropriately  Take medications as prescribed Report any questions or concerns to PharmD and/or provider(s)  Please see past updates related to this goal by clicking on the "Past Updates" button in the selected goal          Patient verbalizes understanding of instructions provided today  and agrees to view in [place].   Telephone follow up appointment with care  management team member scheduled for: 3 months  [name], PharmD Clinical Pharmacist [facility]

## 2021-01-29 NOTE — Chronic Care Management (AMB) (Signed)
Chronic Care Management Pharmacy Note  01/29/2021 Name:  Marco [name] MRN:  [id number] DOB:  21-Feb-1934  Summary: Refill history shows has not filled atorvastatin or ezetimibe in the last 3 months. Patient state he is taking daily and that pharmacy just double filled cholesterol medication this year.  Diabetes meds were lowered due to decrease in renal function at last Chronic Care Management visit. Home blood glucose remains at goal. Patient report has been 100 to 120.   Recommendations/Changes made from today's visit: Recheck lipids (to assess adherence to lipid lowering therapy) and BMP (to assess renal function)    Subjective: Marco [name] is an 87 y.o. year old male who is a primary patient of Paz, Alda Berthold, MD.  The CCM team was consulted for assistance with disease management and care coordination needs.    Engaged with patient by telephone for follow up visit in response to provider referral for pharmacy case management and/or care coordination services.   Consent to Services:  The patient was given information about Chronic Care Management services, agreed to services, and gave verbal consent prior to initiation of services.  Please see initial visit note for detailed documentation.   Patient Care Team: Colon Branch, MD as PCP - General (Internal Medicine) Roel Cluck, MD as Referring Physician (Ophthalmology) [name], PharmD (Pharmacist)  Recent office visits: 09/06/2020 Dr. Larose Kells (PCP) Annual Physical exam. Labs ordered. No medicaiton changes   Recent consult visits:  07/20/2020 Dr Marlou Sa (Orthopedic surgery) Follow up. Joint injection of right knee of lidocaine 1 %/ Hyaluronate 60 MG/3ML. 07/05/2020 Dr. [name] (Otolaryngology) Initial Consult. Start CORTISPORIN otic  06/21/2020 Dr. Marlou Sa, MD (Orthopedic surgery) Bilateral knee pain. Right knee injection of  lidocaine 1 %.  methylPREDNISolone/ bupivacaine Follow up in 8 weeks.  Hospital  visits: None in previous 6 months  Objective:  Lab Results  Component Value Date   CREATININE 1.51 (H) 09/06/2020   CREATININE 1.36 (H) 03/05/2020   CREATININE 1.21 11/01/2019    Lab Results  Component Value Date   HGBA1C 6.9 (H) 09/06/2020   Last diabetic Eye exam:  Lab Results  Component Value Date/Time   HMDIABEYEEXA No Retinopathy 02/23/2020 12:00 AM    Last diabetic Foot exam:  Lab Results  Component Value Date/Time   HMDIABFOOTEX Normal/c/o feet pain 03/08/2015 12:00 AM        Component Value Date/Time   CHOL 126 09/06/2020 0935   TRIG 88.0 09/06/2020 0935   HDL 52.60 09/06/2020 0935   CHOLHDL 2 09/06/2020 0935   VLDL 17.6 09/06/2020 0935   LDLCALC 56 09/06/2020 0935    Hepatic Function Latest Ref Rng & Units 09/06/2020 11/01/2019 10/15/2018  Total Protein 6.0 - 8.3 g/dL 7.1 7.3 7.1  Albumin 3.5 - 5.2 g/dL 4.2 4.2 4.2  AST 0 - 37 U/L _0 ALT 0 - 53 U/L _1 Alk Phosphatase 39 - 117 U/L 86 104 67  Total Bilirubin 0.2 - 1.2 mg/dL 0.7 0.7 0.6    Lab Results  Component Value Date/Time   TSH 3.81 09/06/2020 09:35 AM   TSH 2.21 03/05/2020 09:45 AM   FREET4 0.72 10/01/2017 03:59 PM    CBC Latest Ref Rng & Units 09/06/2020 11/01/2019 04/18/2019  WBC 4.0 - 10.5 K/uL 5.0 5.2 6.3  Hemoglobin 13.0 - 17.0 g/dL 12.0(L) 13.2 13.2  Hematocrit 39.0 - 52.0 % 37.3(L) 40.1 40.6  Platelets 150.0 - 400.0 K/uL 188.0 177.0 209.0  No results found for: VD25OH  Clinical ASCVD: Yes  The ASCVD Risk score (Arnett DK, et al., 2019) failed to calculate for the following reasons:   The 2019 ASCVD risk score is only valid for ages 52 to 34    Other:  DEXA 11/17/2019 Lowest T-Score at right femoral neck = -1.5 (Compared to 09/07/2013 score was -1.9) Patient classified as osteoporosis due to h/o of hip fracture.   Social History   Tobacco Use  Smoking Status Never  Smokeless Tobacco Never   BP Readings from Last 3 Encounters:  09/06/20 122/72  03/05/20 110/74   11/29/19 109/70   Pulse Readings from Last 3 Encounters:  09/06/20 (!) 58  03/05/20 79  11/29/19 96   Wt Readings from Last 3 Encounters:  09/06/20 138 lb 6 oz (62.8 kg)  03/05/20 145 lb 2 oz (65.8 kg)  11/29/19 142 lb 2 oz (64.5 kg)    Assessment: Review of patient past medical history, allergies, medications, health status, including review of consultants reports, laboratory and other test data, was performed as part of comprehensive evaluation and provision of chronic care management services.   SDOH:  (Social Determinants of Health) assessments and interventions performed:     CCM Care Plan  No Known Allergies  Medications Reviewed Today     Reviewed by [name], PharmD (Pharmacist) on 01/28/21 at 1042  Med List Status: <None>   Medication Order Taking? Sig Documenting Provider Last Dose Status Informant  ACCU-CHEK GUIDE test strip [id number] Yes CHECK BLOOD SUGAR ONCE  DAILY. Colon Branch, MD Taking Active   Accu-Chek Softclix Lancets lancets [id number] Yes CHECK BLOOD SUGARS ONCE  DAILY AS DIRECTED Colon Branch, MD Taking Active   Alcohol Swabs PADS [id number] Yes Use to clean finger prior to testing blood sugars Colon Branch, MD Taking Active   alendronate (FOSAMAX) 70 MG tablet [id number] Yes Take 1 tablet (70 mg total) by mouth once a week. Take with a full glass of water on an empty stomach. Colon Branch, MD Taking Active   aspirin EC 81 MG EC tablet [id number] Yes Take 1 tablet (81 mg total) by mouth daily. Murlean Iba, MD Taking Active   atorvastatin (LIPITOR) 40 MG tablet [id number] Yes Take 1 tablet (40 mg total) by mouth at bedtime. Colon Branch, MD Taking Active   Blood Glucose Calibration (ACCU-CHEK GUIDE CONTROL) Yehuda Budd [id number] Yes Use as directed Colon Branch, MD Taking Active   Blood Glucose Monitoring Suppl (ACCU-CHEK GUIDE) w/Device Drucie Opitz [id number] Yes Check blood sugars once daily Colon Branch, MD Taking Active   Calcium Carb-Cholecalciferol (CALCIUM 500 +  D) 500-125 MG-UNIT TABS [id number] Yes Take 1 tablet by mouth daily. [provider] Taking Active   ezetimibe (ZETIA) 10 MG tablet [id number] Yes Take 1 tablet (10 mg total) by mouth daily. Colon Branch, MD Taking Active   levothyroxine (SYNTHROID) 75 MCG tablet [id number] Yes Take 1 tablet (75 mcg total) by mouth daily before breakfast. Colon Branch, MD Taking Active   metFORMIN (GLUCOPHAGE) 500 MG tablet [id number] Yes Take 1 tablet (500 mg total) by mouth 2 (two) times daily with a meal. Colon Branch, MD Taking Active   nitroGLYCERIN (NITROSTAT) 0.4 MG SL tablet [id number] No Place 1 tablet (0.4 mg total) under the tongue every 5 (five) minutes x 3 doses as needed for chest pain.  Patient not taking: No sig reported   Colon Branch, MD Not Taking Active  Med Note (CANTER, KAYLYN D   Mon Mar 05, 2020  9:04 AM) PRN  OVER THE COUNTER MEDICATION [id number] Yes Taking laxative from [place] as needed [provider] Taking Active   sitaGLIPtin (JANUVIA) 100 MG tablet [id number] Yes Take 1 tablet (100 mg total) by mouth daily.  Patient taking differently: Take 50 mg by mouth daily.   Paz, Jose E, MD Taking Active   vitamin B-12 (CYANOCOBALAMIN) 1000 MCG tablet [id number] Yes Take 1,000 mcg by mouth daily. [provider] Taking Active             Patient Active Problem List   Diagnosis Date Noted   CAD (coronary artery disease) 03/06/2020   Hypothyroidism 12/02/2017   Chronic pain of right knee 10/13/2017   Chest pain 11/19/2016   Amaurosis fugax 12/12/2015   PCP NOTES >>> 03/08/2015   Osteoporosis 09/15/2014   Primary osteoarthritis of both knees 03/16/2014   Hearing difficulty 10/20/2013   DJD (degenerative joint disease) 08/25/2013   Neck mass 04/25/2013   Hyperlipidemia 04/25/2013   Erectile dysfunction 12/26/2011   Annual physical exam 08/22/2011   Prostate nodule 08/22/2011   Diabetes mellitus (HCC) 05/07/2011    Immunization History  Administered  Date(s) Administered   PFIZER(Purple Top)SARS-COV-2 Vaccination 05/20/2019, 06/10/2019, 02/02/2020    Conditions to be addressed/monitored: CAD, HLD, DMII, and hypothyroidism; CKD; constipation; osteoporosis  Care Plan : General Pharmacy (Adult)  Updates made by Eckard, Tammy, PHARMD since 01/29/2021 12:00 AM     Problem: type 2 DM; hyperlipidemia; CAD; ED; osteoporosis; hypothyroidism; CKD; OA / DJD; constipation   Priority: High     Long-Range Goal: Medication and Chronic Care Management   Start Date: 10/24/2020  Recent Progress: On track  Priority: High  Note:   Current Barriers:  Does not contact provider office for questions/concerns Patient is taking medications from [place] that were not prescribed by PCP Changing renal function   Pharmacist Clinical Goal(s):  Over the next 90 days, patient will achieve adherence to monitoring guidelines and medication adherence to achieve therapeutic efficacy maintain control of hyperlipidemia and type 2 DM as evidenced by maintaining LDL <70 and A1c < 7.0%  adhere to prescribed medication regimen as evidenced by fill history and pill count  through collaboration with PharmD and provider.  Monitor renal function and adjust medication therapy as needed  Interventions: 1:1 collaboration with Paz, Jose E, MD regarding development and update of comprehensive plan of care as evidenced by provider attestation and co-signature Inter-disciplinary care team collaboration (see longitudinal plan of care) Comprehensive medication review performed; medication list updated in electronic medical record  Hyperlipidemia Controlled;  LDL goal < 70 Current regimen:  Atorvastatin 40mg daily  Ezetimibe 10mg daily Tolerating regimen well without side effects Noted that atorvastatin and ezetimibe had not been filled since April. Patient states both were double filled sometime this year and he currently has plenty. Endorsed that he is taking both every day.   Interventions: Reviewed LDL goal Emphasized the importance of taking both cholesterol medications daily Discussed that each of his cholesterol medications work differently but together to help lower LDL cholesterol and reduce heart disease Maintain current cholesterol medication regimen.   Diabetes Controlled; A1c goal <7%  Lab Results  Component Value Date   CREATININE 1.51 (H) 09/06/2020   CREATININE 1.36 (H) 03/05/2020   CREATININE 1.21 11/01/2019  Last eGFR = 41.9 mL/min Recent home BG readings: 100 to 120  Current regimen:  Metformin 500mg - take 1 tablet twice a day (  lowered 10/24/2020) Januvia 50mg daily (dose lowered due to renal function 10/24/2020) Interventions: Emphasized the importance of medication adherence Discussed A1c goals Continue to check blood glucose once daily, document, and provide at future appointments Reminded to have yearly eye exam  Hypothyroidism Controlled; Goal: maintain TSH within normal limits  Lab Results  Component Value Date   TSH 3.81 09/06/2020  Current regimen:  Levothyroxine 75mcg daily Interventions: Emphasized the importance of medication adherence Maintain hypothyroid medication regimen.   Osteoporosis Goal: reduce risk of fracture due to osteoporosis  History of hip fracture DEXA 11/17/2019: Lowest T-Score was -1.5 at right femoral neck  Compared to previous T-Score from 09/07/2013 of -1.9 has improved.  Current regimen:  Alendronate 70mg take 1 tablet once a week - take in morning on empty stomach at least 30 minutes before food or medications; Take with at least 4 ounces of water. Do not lie down or recline for 30 minutes after taking alendronate.  Calcium 500mg daily/Vitamin D 5000 units daily  Interventions: Discussed fall prevention Ensure daily intake of at least 1200mg of calcium daily through diet and/or supplementation Ensure daily intake of at least 1000 units of vitamin D through supplementation  Maintain  osteoporosis medication regimen  Constipation:  During Chronic Care Management visit 10/2020 patient was taking a laxative from [place] but was unable to tell me the name. Reports today he is no longer taking this medication. Patient reports daily BM and denied straining.  Current Regimen:  none Interventions:  Provided information on how to improve BM with adequate water and fiber intake.   Medication management Pharmacist Clinical Goal(s): Over the next 90 days, patient will work with PharmD and providers to achieve optimal medication adherence Current pharmacy: [facility] Interventions Comprehensive medication review performed. Continue current medication management strategy Reviewed refill history. Noted that atorvastatin and ezetimibe had not been filled since April. Patient states both were double filled sometime this year and he currently has plenty. Endorsed that he is taking both every day. Will continue to follow adherence.   Patient Goals/Self-Care Activities Over the next 90 days, patient will:  take medications as prescribed, check glucose daily , document, and provide at future appointments, and report to PCP if BMs not improved.  Follow Up Plan:  Will review labs after sees PCP in October to see if LDL still at goal.   F/U with clinical pharmacist in 3 months unless needed sooner.         Medication Assistance: None required.  Patient affirms current coverage meets needs.  Patient's preferred pharmacy is:  [id number] - [place], [place] - [address] [place] [address] Phone: [phone] Fax: [phone]  [facility] - [place], [place] - [address] [place] [address] Phone: [phone] Fax: [phone]  Follow Up:  Patient agrees to Care Plan and Follow-up.  Plan: Chronic Care Management team will f/u BG  reading in 1 month; Clinical pharmacist will follow up in 3 months, unless needed sooner.   Tammy Eckard, PharmD Clinical Pharmacist [facility] [place] [facility] [phone]     

## 2021-02-01 DIAGNOSIS — I2510 Atherosclerotic heart disease of native coronary artery without angina pectoris: Secondary | ICD-10-CM

## 2021-02-01 DIAGNOSIS — E785 Hyperlipidemia, unspecified: Secondary | ICD-10-CM | POA: Diagnosis not present

## 2021-02-01 DIAGNOSIS — M810 Age-related osteoporosis without current pathological fracture: Secondary | ICD-10-CM

## 2021-02-01 DIAGNOSIS — E1169 Type 2 diabetes mellitus with other specified complication: Secondary | ICD-10-CM | POA: Diagnosis not present

## 2021-02-07 ENCOUNTER — Other Ambulatory Visit: Payer: Self-pay | Admitting: Internal Medicine

## 2021-02-11 ENCOUNTER — Encounter: Payer: Self-pay | Admitting: Internal Medicine

## 2021-02-11 ENCOUNTER — Ambulatory Visit (INDEPENDENT_AMBULATORY_CARE_PROVIDER_SITE_OTHER): Payer: Medicare Other | Admitting: Internal Medicine

## 2021-02-11 ENCOUNTER — Other Ambulatory Visit: Payer: Self-pay

## 2021-02-11 ENCOUNTER — Ambulatory Visit (HOSPITAL_BASED_OUTPATIENT_CLINIC_OR_DEPARTMENT_OTHER)
Admission: RE | Admit: 2021-02-11 | Discharge: 2021-02-11 | Disposition: A | Discharge status: Home / Self Care | Payer: Medicare Other | Source: Ambulatory Visit | Attending: Internal Medicine | Admitting: Internal Medicine

## 2021-02-11 ENCOUNTER — Telehealth: Payer: Self-pay | Admitting: Internal Medicine

## 2021-02-11 VITALS — BP 132/80 | HR 67 | Temp 97.8°F | Resp 18 | Ht 69.0 in | Wt 146.4 lb

## 2021-02-11 DIAGNOSIS — R600 Localized edema: Secondary | ICD-10-CM | POA: Diagnosis not present

## 2021-02-11 DIAGNOSIS — N1832 Chronic kidney disease, stage 3b: Secondary | ICD-10-CM | POA: Diagnosis not present

## 2021-02-11 DIAGNOSIS — E039 Hypothyroidism, unspecified: Secondary | ICD-10-CM

## 2021-02-11 DIAGNOSIS — D649 Anemia, unspecified: Secondary | ICD-10-CM | POA: Diagnosis not present

## 2021-02-11 DIAGNOSIS — N183 Chronic kidney disease, stage 3 unspecified: Secondary | ICD-10-CM | POA: Insufficient documentation

## 2021-02-11 DIAGNOSIS — N189 Chronic kidney disease, unspecified: Secondary | ICD-10-CM

## 2021-02-11 DIAGNOSIS — M7989 Other specified soft tissue disorders: Secondary | ICD-10-CM

## 2021-02-11 DIAGNOSIS — E785 Hyperlipidemia, unspecified: Secondary | ICD-10-CM

## 2021-02-11 DIAGNOSIS — E1169 Type 2 diabetes mellitus with other specified complication: Secondary | ICD-10-CM | POA: Diagnosis not present

## 2021-02-11 DIAGNOSIS — E875 Hyperkalemia: Secondary | ICD-10-CM

## 2021-02-11 LAB — IBC + FERRITIN
Ferritin: 60.7 ng/mL (ref 22.0–322.0)
Iron: 116 ug/dL (ref 42–165)
Saturation Ratios: 32.5 % (ref 20.0–50.0)
TIBC: 357 ug/dL (ref 250.0–450.0)
Transferrin: 255 mg/dL (ref 212.0–360.0)

## 2021-02-11 LAB — CBC WITH DIFFERENTIAL/PLATELET
Basophils Absolute: 0.1 10*3/uL (ref 0.0–0.1)
Basophils Relative: 1 % (ref 0.0–3.0)
Eosinophils Absolute: 0.5 10*3/uL (ref 0.0–0.7)
Eosinophils Relative: 9.6 % — ABNORMAL HIGH (ref 0.0–5.0)
HCT: 35.6 % — ABNORMAL LOW (ref 39.0–52.0)
Hemoglobin: 11.4 g/dL — ABNORMAL LOW (ref 13.0–17.0)
Lymphocytes Relative: 24.6 % (ref 12.0–46.0)
Lymphs Abs: 1.3 10*3/uL (ref 0.7–4.0)
MCHC: 32 g/dL (ref 30.0–36.0)
MCV: 83.8 fl (ref 78.0–100.0)
Monocytes Absolute: 0.5 10*3/uL (ref 0.1–1.0)
Monocytes Relative: 8.9 % (ref 3.0–12.0)
Neutro Abs: 3 10*3/uL (ref 1.4–7.7)
Neutrophils Relative %: 55.9 % (ref 43.0–77.0)
Platelets: 165 10*3/uL (ref 150.0–400.0)
RBC: 4.25 Mil/uL (ref 4.22–5.81)
RDW: 14.2 % (ref 11.5–15.5)
WBC: 5.3 10*3/uL (ref 4.0–10.5)

## 2021-02-11 LAB — MICROALBUMIN / CREATININE URINE RATIO
Creatinine,U: 143.9 mg/dL
Microalb Creat Ratio: 2.8 mg/g (ref 0.0–30.0)
Microalb, Ur: 4.1 mg/dL — ABNORMAL HIGH (ref 0.0–1.9)

## 2021-02-11 LAB — BASIC METABOLIC PANEL
BUN: 24 mg/dL — ABNORMAL HIGH (ref 6–23)
CO2: 27 mEq/L (ref 19–32)
Calcium: 9.9 mg/dL (ref 8.4–10.5)
Chloride: 104 mEq/L (ref 96–112)
Creatinine, Ser: 1.91 mg/dL — ABNORMAL HIGH (ref 0.40–1.50)
GFR: 31.52 mL/min — ABNORMAL LOW (ref >60.00)
Glucose, Bld: 117 mg/dL — ABNORMAL HIGH (ref 70–99)
Potassium: 5.6 mEq/L — ABNORMAL HIGH (ref 3.5–5.1)
Sodium: 138 mEq/L (ref 135–145)

## 2021-02-11 LAB — TSH: TSH: 7.09 u[IU]/mL — ABNORMAL HIGH (ref 0.35–5.50)

## 2021-02-11 LAB — HEMOGLOBIN A1C: Hgb A1c MFr Bld: 7.1 % — ABNORMAL HIGH (ref 4.6–6.5)

## 2021-02-11 MED ORDER — SITAGLIPTIN PHOSPHATE 100 MG PO TABS: 50.0000 mg | ORAL_TABLET | Freq: Every day | ORAL | Status: DC

## 2021-02-11 MED ORDER — LOKELMA 10 G PO PACK: PACK | ORAL | 0 refills | Status: DC

## 2021-02-11 NOTE — Telephone Encounter (Signed)
Lab results: Increased creatinine to 1.9. K+:   5.6. TSH 7.09. A1c slightly increased to 7.1. Plan: Lokelma 10 g BID daily x2 days, then 10 g daily.  (Prescription sent Nephrology referral Renal ultrasound Increase levothyroxine to 100 mcg BMP 1 week TSH 6 weeks  Left message for the patient with above information, please call patient in the morning, be sure he understood the instructions, place the orders (except Lokelma which I sent already)

## 2021-02-11 NOTE — Assessment & Plan Note (Signed)
DM: On metformin 500 mg twice daily and Januvia (since last visit, dose decreased from 100 mg to 50 mg based on GFR) Check A1c. Hypothyroidism: TSH trending up, check TSH. R leg swelling: Going on for a while, check ultrasound to be sure. CAD: No symptoms, on aspirin CKD: GFR decreased, rechecking today.  Consider SGLT2's. Consider  renal ultrasound.   Anemia: CBC and iron panel.  Call GI symptoms, patient said he had a previous colonoscopy( no documentation); declined further screenings. Osteoporosis: On Fosamax, calcium, vitamin D. Weight loss: See last visit, he has gained some weight. Preventive care: Declined any and all vaccines today Addendum, results: Increased creatinine, to 1.9. GFR :35 K+:   5.6. TSH 7.09. A1c slightly increased to 7.1.   Still okay to use low-dose januvia-metformin on Fosamax Plan: Lokelma 10 g BID daily x2 days, then 10 g daily. Nephrology referral Renal ultrasound Increase levothyroxine to 100 mcg BMP 1 week TSH 6 weeks Left a detailed message for the patient See phone note

## 2021-02-11 NOTE — Progress Notes (Signed)
Subjective:    Patient ID: Marco [name], male    DOB: [birth year], 87 y.o.   MRN: [id number]  DOS:  02/11/2021 Type of visit - description: Routine visit  In general feels well. All chronic medical problems were discussed.  He denies chest pain or difficulty breathing Has chronic (more than a year) swelling at the right leg. Denies dysuria, gross hematuria or difficulty urinating. Ambulatory CBGs are in the low 100s  Wt Readings from Last 3 Encounters:  02/11/21 146 lb 6 oz (66.4 kg)  09/06/20 138 lb 6 oz (62.8 kg)  03/05/20 145 lb 2 oz (65.8 kg)     Review of Systems See above   Past Medical History:  Diagnosis Date   Amaurosis fugax 2017   Diabetes mellitus    DJD (degenerative joint disease) 08/25/2013   Elevated PSA    Other and unspecified hyperlipidemia 04/25/2013    Past Surgical History:  Procedure Laterality Date   CATARACT EXTRACTION     Bilaterally   TYMPANIC MEMBRANE REPAIR     R    Allergies as of 02/11/2021   No Known Allergies      Medication List        Accurate as of February 11, 2021  9:04 PM. If you have any questions, ask your nurse or doctor.          Accu-Chek Guide Control Liqd Use as directed   Accu-Chek Guide test strip Generic drug: glucose blood CHECK BLOOD SUGAR ONCE  DAILY.   Accu-Chek Guide w/Device Kit Check blood sugars once daily   Accu-Chek Softclix Lancets lancets CHECK BLOOD SUGARS ONCE  DAILY AS DIRECTED   Alcohol Swabs Pads Use to clean finger prior to testing blood sugars   alendronate 70 MG tablet Commonly known as: FOSAMAX Take 1 tablet (70 mg total) by mouth once a week. Take with a full glass of water on an empty stomach.   aspirin 81 MG EC tablet Take 1 tablet (81 mg total) by mouth daily.   atorvastatin 40 MG tablet Commonly known as: LIPITOR Take 1 tablet (40 mg total) by mouth at bedtime.   Calcium 500 + D 500-125 MG-UNIT Tabs Generic drug: Calcium Carb-Cholecalciferol Take 1 tablet  by mouth daily.   ezetimibe 10 MG tablet Commonly known as: ZETIA Take 1 tablet (10 mg total) by mouth daily.   levothyroxine 75 MCG tablet Commonly known as: SYNTHROID Take 1 tablet (75 mcg total) by mouth daily before breakfast.   Lokelma 10 g Pack packet Generic drug: sodium zirconium cyclosilicate 10 g twice daily x2 days, then 10 g daily Started by: [name], MD   metFORMIN 500 MG tablet Commonly known as: GLUCOPHAGE Take 1 tablet (500 mg total) by mouth 2 (two) times daily with a meal.   nitroGLYCERIN 0.4 MG SL tablet Commonly known as: NITROSTAT Place 1 tablet (0.4 mg total) under the tongue every 5 (five) minutes x 3 doses as needed for chest pain.   OVER THE COUNTER MEDICATION Taking laxative from [place] as needed   sitaGLIPtin 100 MG tablet Commonly known as: Januvia Take 0.5 tablets (50 mg total) by mouth daily.   vitamin B-12 1000 MCG tablet Commonly known as: CYANOCOBALAMIN Take 1,000 mcg by mouth daily.           Objective:   Physical Exam BP 132/80 (BP Location: Left Arm, Patient Position: Sitting, Cuff Size: Small)   Pulse 67   Temp 97.8 F (36.6 C) (Oral)  Resp 18   Ht 5' 9" (1.753 m)   Wt 146 lb 6 oz (66.4 kg)   SpO2 97%   BMI 21.62 kg/m  General:   Well developed, NAD, BMI noted. HEENT:  Normocephalic . Face symmetric, atraumatic Lungs:  CTA B Normal respiratory effort, no intercostal retractions, no accessory muscle use. Heart: RRR,  no murmur.  Lower extremities: L leg trace edema R leg: 1+ edema from mid calf down, calf is approximately 1 calf larger in circumference.  Not tender. Skin: Not pale. Not jaundice Neurologic:  alert & oriented X3.  Speech normal, gait limited by DJD, uses a cane Psych--  Cognition and judgment appear intact.  Cooperative with normal attention span and concentration.  Behavior appropriate. No anxious or depressed appearing.      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 Osteoporosis: T score -1.5, hip fracture Osteopenia : DEXA 2015, Rx calcium and vitamin D.  DEXA 11-2019: Rx Fosamax ED Elevated PSA-prostate nodule:saw urology multiple times, last ~ 2013, declined a bx, declined further eval, aware of risks HOH  PLAN: DM: On metformin 500 mg twice daily and Januvia (since last visit, dose decreased from 100 mg to 50 mg based on GFR) Check A1c. Hypothyroidism: TSH trending up, check TSH. R leg swelling: Going on for a while, check ultrasound to be sure. CAD: No symptoms, on aspirin CKD: GFR decreased, rechecking today.  Consider SGLT2's. Consider  renal ultrasound.   Anemia: CBC and iron panel.  Call GI symptoms, patient said he had a previous colonoscopy( no documentation); declined further screenings. Osteoporosis: On Fosamax, calcium, vitamin D. Weight loss: See last visit, he has gained some weight. Preventive care: Declined any and all vaccines today Addendum, results: Increased creatinine, to 1.9. GFR :35 K+:   5.6. TSH 7.09. A1c slightly increased to 7.1.   Still okay to use low-dose januvia-metformin on Fosamax Plan: Lokelma 10 g BID daily x2 days, then 10 g daily. Nephrology referral Renal ultrasound Increase levothyroxine to 100 mcg BMP 1 week TSH 6 weeks Left a detailed message for the patient See phone note  Time spent 40 minutes addressing abnormal labs.  This visit occurred during the SARS-CoV-2 public health emergency.  Safety protocols were in place, including screening questions prior to the visit, additional usage of staff PPE, and extensive cleaning of exam room while observing appropriate contact time as indicated for  disinfecting solutions.

## 2021-02-11 NOTE — Patient Instructions (Signed)
   GO TO THE LAB : Get the blood work     [place], [place] Come back for a checkup in 3 months  STOP BY THE FIRST FLOOR: Schedule ultrasound of your right leg.

## 2021-02-12 MED ORDER — LEVOTHYROXINE SODIUM 100 MCG PO TABS: 100.0000 ug | ORAL_TABLET | Freq: Every day | ORAL | 0 refills | Status: DC

## 2021-02-12 NOTE — Telephone Encounter (Signed)
Did either of you try contacting Pt yesterday?

## 2021-02-12 NOTE — Telephone Encounter (Signed)
Plan: Lokelma 10 g BID daily x2 days, then 10 g daily. Nephrology referral Renal ultrasound Increase levothyroxine to 100 mcg BMP 1 week TSH 6 weeks   Spoke w/ Pt- informed of results and recommendations. Pt verbalized understanding, I informed I'd also send via Mychart as this is a lot of information. I gave imaging dept number to him to call and schedule US renal. Levothyroxine 174mcg and Lokelma (already sent by PCP) to [facility]. BMP and TSH ordred.

## 2021-02-12 NOTE — Telephone Encounter (Signed)
I left a message yesterday. Please call patient, discuss plan with him and place order (I did send Lokelma)

## 2021-02-12 NOTE — Telephone Encounter (Signed)
After HOURS call  [place] Phone Number [phone] Call Type Message Only Information Provided Reason for Call Returning a Call from the Office Initial [place] says that somebody just called him but did not say what about. He was seen today

## 2021-02-13 ENCOUNTER — Ambulatory Visit (INDEPENDENT_AMBULATORY_CARE_PROVIDER_SITE_OTHER): Payer: Medicare Other | Admitting: Pharmacist

## 2021-02-13 ENCOUNTER — Ambulatory Visit (HOSPITAL_BASED_OUTPATIENT_CLINIC_OR_DEPARTMENT_OTHER): Payer: Medicare Other

## 2021-02-13 DIAGNOSIS — M810 Age-related osteoporosis without current pathological fracture: Secondary | ICD-10-CM

## 2021-02-13 DIAGNOSIS — N1832 Chronic kidney disease, stage 3b: Secondary | ICD-10-CM

## 2021-02-13 DIAGNOSIS — E1169 Type 2 diabetes mellitus with other specified complication: Secondary | ICD-10-CM

## 2021-02-13 DIAGNOSIS — E875 Hyperkalemia: Secondary | ICD-10-CM

## 2021-02-13 DIAGNOSIS — E039 Hypothyroidism, unspecified: Secondary | ICD-10-CM

## 2021-02-13 DIAGNOSIS — E785 Hyperlipidemia, unspecified: Secondary | ICD-10-CM

## 2021-02-13 NOTE — Patient Instructions (Addendum)
Visit Information  PATIENT GOALS:  Goals Addressed             This Visit's Progress    Chronic Care Management Pharmacy Care Plan       CARE PLAN ENTRY (see longitudinal plan of care for additional care plan information)  Current Barriers:  Chronic Disease Management support, education, and care coordination needs related to Diabetes, Hyperlipidemia, Hypothyroidism, Osteoporosis   Hyperlipidemia Lab Results  Component Value Date/Time   LDLCALC 56 09/06/2020 09:35 AM  Pharmacist Clinical Goal(s): Over the next 90 days, patient will work with PharmD and providers to maintain LDL goal < 70 Current regimen:  Atorvastatin 40mg  daily  Ezetimibe 10mg  daily Interventions: Emphasized the importance of taking both cholesterol medications daily Discussed that each of his cholesterol medications work differently but together to help lower LDL cholesterol and prevent heart disease Patient self care activities - Over the next 90 days, patient will: Maintain current cholesterol medication regimen.   Diabetes Lab Results  Component Value Date/Time   HGBA1C 6.9 (H) 09/06/2020 09:35 AM   HGBA1C 6.9 (H) 03/05/2020 09:45 AM  Pharmacist Clinical Goal(s): Over the next 90 days, patient will work with PharmD and providers to maintain A1c goal <7% Current regimen:  Metformin 500mg  - take 1 tablets twice daily  Januvia 50mg  daily Interventions: Emphasized the importance of med adherence Discussed A1c goals Patient self care activities - Over the next 90 days, patient will: Check blood sugar once daily, document, and provide at future appointments; Call office if you have readings less than 80 or over 200. Contact provider with any episodes of hypoglycemia Remember to have yearly eye exam Home blood glucose goals  Fasting blood glucose goal (before meals) = 80 to 130 Blood glucose goal after a meal = less than 180    Hypothyroidism Pharmacist Clinical Goal(s) Over the next 90 days,  patient will work with PharmD and providers to maintain TSH within normal limits Current regimen:  Levothyroxine 152mcg daily Interventions: Emphasized the importance of med adherence Patient self care activities - Over the next 90 days, patient will: Start new dose of levothyroxine 139mcg daily  Osteoporosis Pharmacist Clinical Goal(s) Over the next 90 days, patient will work with PharmD and providers to reduce risk of fracture due to osteoporosis  Current regimen:  Alendronate 70mg  take 1 tablet once a week - take in morning on empty stomach at least 30 minutes before food or medications; Take with at least 4 ounces of water. Do not lie down or recline for 30 minutes after taking alendronate.  Calcium 500mg  daily/Vitamin D 5000 units daily  Interventions: Consider intake of 1200mg  of calcium daily through diet and/or supplementation Consider intake of [phone] units of vitamin D through supplementation  Discussed fall prevention Patient self care activities - Over the next 90 days, patient will: Ensure daily intake of at least 1200mg  of calcium daily through diet and/or supplementation Ensure daily intake of at least 1000 units of vitamin D through supplementation  Do not take alendronate until further instruction  Medication management Pharmacist Clinical Goal(s): Over the next 90 days, patient will work with PharmD and providers to achieve optimal medication adherence Current pharmacy: [facility] Order Interventions Comprehensive medication review performed. Continue current medication management strategy Patient self care activities - Over the next 90 days, patient will: Focus on medication adherence by filling and taking medications appropriately  Take medications as prescribed Report any questions or concerns to PharmD and/or provider(s)  Please see past updates  related to this goal by clicking on the "Past Updates" button in the selected goal          Patient  verbalizes understanding of instructions provided today and agrees to view in [place].   Telephone follow up appointment with care management team member scheduled for: 1 months  [name], PharmD Clinical Pharmacist [facility]

## 2021-02-14 NOTE — Chronic Care Management (AMB) (Signed)
Chronic Care Management Pharmacy Note  02/14/2021 Name:  Marco [name] MRN:  [id number] DOB:  1934-04-02  Summary: Continued decrease in eGFR. Reviewed medications for appropriate dosing due to decrease.   Recommendations/Changes made from today's visit: Recommended hold alendronate until further instructions due to CrCl <35 mL/min Could consider SGLT2 for CKD however at current renal function will likely have little effect at further lowering blood glucose.   Subjective: Marco [name] is an 87 y.o. year old male who is a primary patient of Paz, Alda Berthold, MD.  The CCM team was consulted for assistance with disease management and care coordination needs.    Engaged with patient by telephone for follow up visit in response to provider referral for pharmacy case management and/or care coordination services.   Consent to Services:  The patient was given information about Chronic Care Management services, agreed to services, and gave verbal consent prior to initiation of services.  Please see initial visit note for detailed documentation.   Patient Care Team: Colon Branch, MD as PCP - General (Internal Medicine) Roel Cluck, MD as Referring Physician (Ophthalmology) [name], PharmD (Pharmacist)  Recent office visits: 02/11/2021 - PCP (Dr Larose Kells) Seen for follow up chronic conditions. Labs drawn and noted to have decreased in eGFR and hyperkalemia. A1c and TSH also increased.  Prescribed Lokelma for hyperkalemia. Levothyroxine increased from 84mg to 1076m daily. Referred to nephrology. 09/06/2020 - PCP (Dr PaLarose Kells Annual Physical exam. Labs ordered. No medicaiton changes   Recent consult visits:  07/20/2020 Dr DeMarlou SaOrthopedic surgery) Follow up. Joint injection of right knee of lidocaine 1 %/ Hyaluronate 60 MG/3ML. 07/05/2020 Dr. DaHassell DoneOtolaryngology) Initial Consult. Start CORTISPORIN otic  06/21/2020 Dr. DeMarlou SaMD (Orthopedic surgery) Bilateral knee pain. Right  knee injection of  lidocaine 1 %.  methylPREDNISolone/ bupivacaine Follow up in 8 weeks.  Hospital visits: None in previous 6 months  Objective:  Lab Results  Component Value Date   CREATININE 1.91 (H) 02/11/2021   CREATININE 1.51 (H) 09/06/2020   CREATININE 1.36 (H) 03/05/2020    Lab Results  Component Value Date   HGBA1C 7.1 (H) 02/11/2021   Last diabetic Eye exam:  Lab Results  Component Value Date/Time   HMDIABEYEEXA No Retinopathy 02/23/2020 12:00 AM    Last diabetic Foot exam:  Lab Results  Component Value Date/Time   HMDIABFOOTEX Normal/c/o feet pain 03/08/2015 12:00 AM        Component Value Date/Time   CHOL 126 09/06/2020 0935   TRIG 88.0 09/06/2020 0935   HDL 52.60 09/06/2020 0935   CHOLHDL 2 09/06/2020 0935   VLDL 17.6 09/06/2020 0935   LDLCALC 56 09/06/2020 0935    Hepatic Function Latest Ref Rng & Units 09/06/2020 11/01/2019 10/15/2018  Total Protein 6.0 - 8.3 g/dL 7.1 7.3 7.1  Albumin 3.5 - 5.2 g/dL 4.2 4.2 4.2  AST 0 - 37 U/L '23 31 24  ' ALT 0 - 53 U/L '15 22 14  ' Alk Phosphatase 39 - 117 U/L 86 104 67  Total Bilirubin 0.2 - 1.2 mg/dL 0.7 0.7 0.6    Lab Results  Component Value Date/Time   TSH 7.09 (H) 02/11/2021 09:47 AM   TSH 3.81 09/06/2020 09:35 AM   FREET4 0.72 10/01/2017 03:59 PM    CBC Latest Ref Rng & Units 02/11/2021 09/06/2020 11/01/2019  WBC 4.0 - 10.5 K/uL 5.3 5.0 5.2  Hemoglobin 13.0 - 17.0 g/dL 11.4(L) 12.0(L) 13.2  Hematocrit 39.0 - 52.0 % 35.6(L) 37.3(L)  40.1  Platelets 150.0 - 400.0 K/uL 165.0 188.0 177.0    No results found for: VD25OH  Clinical ASCVD: Yes  The ASCVD Risk score (Arnett DK, et al., 2019) failed to calculate for the following reasons:   The 2019 ASCVD risk score is only valid for ages 19 to 17    Other:  DEXA 11/17/2019 Lowest T-Score at right femoral neck = -1.5 (Compared to 09/07/2013 score was -1.9) Patient classified as osteoporosis due to h/o of hip fracture.   Social History   Tobacco Use  Smoking  Status Never  Smokeless Tobacco Never   BP Readings from Last 3 Encounters:  02/11/21 132/80  09/06/20 122/72  03/05/20 110/74   Pulse Readings from Last 3 Encounters:  02/11/21 67  09/06/20 (!) 58  03/05/20 79   Wt Readings from Last 3 Encounters:  02/11/21 146 lb 6 oz (66.4 kg)  09/06/20 138 lb 6 oz (62.8 kg)  03/05/20 145 lb 2 oz (65.8 kg)    Assessment: Review of patient past medical history, allergies, medications, health status, including review of consultants reports, laboratory and other test data, was performed as part of comprehensive evaluation and provision of chronic care management services.   SDOH:  (Social Determinants of Health) assessments and interventions performed:     CCM Care Plan  No Known Allergies  Medications Reviewed Today     Reviewed by [name], PharmD (Pharmacist) on 02/13/21 at [facility] List Status: <None>   Medication Order Taking? Sig Documenting Provider Last Dose Status Informant  ACCU-CHEK GUIDE test strip [id number] Yes CHECK BLOOD SUGAR ONCE  DAILY. Colon Branch, MD Taking Active   Accu-Chek Softclix Lancets lancets [id number] Yes CHECK BLOOD SUGARS ONCE  DAILY AS DIRECTED Colon Branch, MD Taking Active   Alcohol Swabs PADS [id number] Yes Use to clean finger prior to testing blood sugars Colon Branch, MD Taking Active   alendronate (FOSAMAX) 70 MG tablet [id number] Yes Take 1 tablet (70 mg total) by mouth once a week. Take with a full glass of water on an empty stomach. Colon Branch, MD Taking Active   aspirin EC 81 MG EC tablet [id number] Yes Take 1 tablet (81 mg total) by mouth daily. Murlean Iba, MD Taking Active   atorvastatin (LIPITOR) 40 MG tablet [id number] Yes Take 1 tablet (40 mg total) by mouth at bedtime. Colon Branch, MD Taking Active   Blood Glucose Calibration (ACCU-CHEK GUIDE CONTROL) Yehuda Budd [id number] Yes Use as directed Colon Branch, MD Taking Active   Blood Glucose Monitoring Suppl (ACCU-CHEK GUIDE) w/Device Drucie Opitz  [id number] Yes Check blood sugars once daily Colon Branch, MD Taking Active   Calcium Carb-Cholecalciferol (CALCIUM 500 + D) 500-125 MG-UNIT TABS [id number] Yes Take 1 tablet by mouth daily. [provider] Taking Active   ezetimibe (ZETIA) 10 MG tablet [id number] Yes Take 1 tablet (10 mg total) by mouth daily. Colon Branch, MD Taking Active   levothyroxine (SYNTHROID) 100 MCG tablet [id number] Yes Take 1 tablet (100 mcg total) by mouth daily before breakfast. Colon Branch, MD Taking Active   metFORMIN (GLUCOPHAGE) 500 MG tablet [id number] Yes Take 1 tablet (500 mg total) by mouth 2 (two) times daily with a meal. Colon Branch, MD Taking Active   nitroGLYCERIN (NITROSTAT) 0.4 MG SL tablet [id number] Yes Place 1 tablet (0.4 mg total) under the tongue every 5 (five) minutes x 3 doses as needed for chest pain. Colon Branch, MD Taking Active  Med Note Berneta Levins Mar 05, 2020  9:04 AM) PRN  OVER THE COUNTER MEDICATION [id number] Yes Taking laxative from [place] as needed [provider] Taking Active   sitaGLIPtin (JANUVIA) 100 MG tablet [id number] Yes Take 0.5 tablets (50 mg total) by mouth daily. Colon Branch, MD Taking Active   sodium zirconium cyclosilicate (LOKELMA) 10 g PACK packet [id number] Yes 10 g twice daily x2 days, then 10 g daily Colon Branch, MD Taking Active   vitamin B-12 (CYANOCOBALAMIN) 1000 MCG tablet [id number] Yes Take 1,000 mcg by mouth daily. [provider] Taking Active             Patient Active Problem List   Diagnosis Date Noted   CKD (chronic kidney disease) stage 3, GFR 30-59 ml/min ([place]) 02/11/2021   CAD (coronary artery disease) 03/06/2020   Hypothyroidism 12/02/2017   Chronic pain of right knee 10/13/2017   Chest pain 11/19/2016   Amaurosis fugax 12/12/2015   PCP NOTES >>> 03/08/2015   Osteoporosis 09/15/2014   Primary osteoarthritis of both knees 03/16/2014   Hearing difficulty 10/20/2013   DJD (degenerative joint disease)  08/25/2013   Neck mass 04/25/2013   Hyperlipidemia 04/25/2013   Erectile dysfunction 12/26/2011   Annual physical exam 08/22/2011   Prostate nodule 08/22/2011   Diabetes mellitus ([place]) 05/07/2011    Immunization History  Administered Date(s) Administered   PFIZER(Purple Top)SARS-COV-2 Vaccination 05/20/2019, 06/10/2019, 02/02/2020    Conditions to be addressed/monitored: CAD, HLD, DMII, and hypothyroidism; CKD; constipation; osteoporosis  Patient Care Plan: General Pharmacy (Adult)     Problem Identified: type 2 DM; hyperlipidemia; CAD; ED; osteoporosis; hypothyroidism; CKD; OA / DJD; constipation   Priority: High     Long-Range Goal: Medication and Chronic Care Management   Start Date: 10/24/2020  Recent Progress: On track  Priority: High  Note:   Current Barriers:  Does not contact provider office for questions/concerns Patient is taking medications from [place] that were not prescribed by PCP Changing renal function   Pharmacist Clinical Goal(s):  Over the next 90 days, patient will achieve adherence to monitoring guidelines and medication adherence to achieve therapeutic efficacy maintain control of hyperlipidemia and type 2 DM as evidenced by maintaining LDL <70 and A1c < 7.0%  adhere to prescribed medication regimen as evidenced by fill history and pill count  through collaboration with PharmD and provider.  Monitor renal function and adjust medication therapy as needed  Interventions: 1:1 collaboration with Colon Branch, MD regarding development and update of comprehensive plan of care as evidenced by provider attestation and co-signature Inter-disciplinary care team collaboration (see longitudinal plan of care) Comprehensive medication review performed; medication list updated in electronic medical record  Hyperlipidemia Controlled;  LDL goal < 70 Current regimen:  Atorvastatin 75m daily  Ezetimibe 147mdaily Tolerating regimen well without side effects Noted  that atorvastatin and ezetimibe had not been filled since April. Patient states both were double filled sometime this year and he currently has plenty. Endorsed that he is taking both every day.  Interventions: Reviewed LDL goal Emphasized the importance of taking both cholesterol medications daily Discussed that each of his cholesterol medications work differently but together to help lower LDL cholesterol and reduce heart disease Maintain current cholesterol medication regimen.   Diabetes Controlled; A1c goal <7%  Lab Results  Component Value Date   CREATININE 1.91 (H) 02/11/2021   CREATININE 1.51 (H) 09/06/2020   CREATININE 1.36 (H) 03/05/2020  Most recent eGFR was 31.52 (  previous eGFR = 41.9 mL/min) Recent home BG readings: 105 to 140 Current regimen:  Metformin 532m - take 1 tablet twice a day (lowered 10/24/2020) Januvia 547mdaily (dose lowered due to renal function 10/24/2020) Interventions: Reviewed dose of Januvia and metformin since renal function has decreased more. No further dose adjustments needed currently.  Recommended to lower dose of Januvia to 2570maily if eGFR is < 44m102mn Recommended to stop metformin if eFGR is < 30 mL/min Emphasized the importance of medication adherence Discussed A1c goals Continue to check blood glucose once daily, document, and provide at future appointments Reminded to have yearly eye exam  Hypothyroidism uncontrolled Goal: maintain TSH within normal limits Last TSH was elevated Lab Results  Component Value Date   TSH 7.09 (H) 02/11/2021  Current regimen:  Levothyroxine 100mc70mily (dose increased 02/11/2021) Interventions: Emphasized the importance of medication adherence Continue to new dose of levothyroxine. Recheck labs in 6 to 8 weeks.   Osteoporosis Goal: reduce risk of fracture due to osteoporosis  History of hip fracture DEXA 11/17/2019: Lowest T-Score was -1.5 at right femoral neck  Compared to previous T-Score  from 09/07/2013 of -1.9 has improved.  Current regimen:  Alendronate 70mg 12m 1 tablet once a week - take in morning on empty stomach at least 30 minutes before food or medications; Take with at least 4 ounces of water. Do not lie down or recline for 30 minutes after taking alendronate.  Calcium 500mg d22m/Vitamin D 5000 units daily  Interventions: Due to decrease in renal function, recommended holding alendronate until recheck / appointment with nephrologist.  Ensure daily intake of at least 1200mg of69mcium daily through diet and/or supplementation Ensure daily intake of at least 1000 units of vitamin D through supplementation   CKD - 3B Renal function worsening Had hyperkalemia with K+ of 5.6 Lokelma started.  Due to recheck labs in 1 week Referred to nephrology  Intervention:  See above for dose adjustments / discontinuations based on renal function.  Sent patient information about low potassium diet    Medication management Pharmacist Clinical Goal(s): Over the next 90 days, patient will work with PharmD and providers to achieve optimal medication adherence Current pharmacy: [facility] Interventions Comprehensive medication review performed. Continue current medication management strategy Reviewed refill history. Noted that atorvastatin and ezetimibe had not been filled since April. Patient states both were double filled sometime this year and he currently has plenty. Endorsed that he is taking both every day. Will continue to follow adherence.   Patient Goals/Self-Care Activities Over the next 90 days, patient will:  take medications as prescribed, check glucose daily , document, and provide at future appointments, stop alendronate  Follow Up Plan: F/U with clinical pharmacist in 1 month to review renal function and medications         Medication Assistance: None required.  Patient affirms current coverage meets needs.  [id number] [place] [address] [phone] Mail Service  ([facility] [phone] Up:  Patient agrees to Care Plan and Follow-up.  Plan: Chronic Care Management team will f/u BG and kidney function in 1 month   Nadiya Pieratt EcCherre Robins Clinical Pharmacist [place]  [place] [phone]

## 2021-02-18 ENCOUNTER — Other Ambulatory Visit (HOSPITAL_BASED_OUTPATIENT_CLINIC_OR_DEPARTMENT_OTHER): Payer: Medicare Other

## 2021-02-19 ENCOUNTER — Other Ambulatory Visit: Payer: Medicare Other

## 2021-02-20 ENCOUNTER — Ambulatory Visit (HOSPITAL_BASED_OUTPATIENT_CLINIC_OR_DEPARTMENT_OTHER)
Admission: RE | Admit: 2021-02-20 | Discharge: 2021-02-20 | Disposition: A | Discharge status: Home / Self Care | Payer: Medicare Other | Source: Ambulatory Visit | Attending: Internal Medicine | Admitting: Internal Medicine

## 2021-02-20 ENCOUNTER — Other Ambulatory Visit (INDEPENDENT_AMBULATORY_CARE_PROVIDER_SITE_OTHER): Payer: Medicare Other

## 2021-02-20 ENCOUNTER — Other Ambulatory Visit: Payer: Self-pay

## 2021-02-20 DIAGNOSIS — E875 Hyperkalemia: Secondary | ICD-10-CM

## 2021-02-20 DIAGNOSIS — N1330 Unspecified hydronephrosis: Secondary | ICD-10-CM | POA: Diagnosis not present

## 2021-02-20 DIAGNOSIS — E039 Hypothyroidism, unspecified: Secondary | ICD-10-CM | POA: Diagnosis not present

## 2021-02-20 DIAGNOSIS — N189 Chronic kidney disease, unspecified: Secondary | ICD-10-CM | POA: Diagnosis not present

## 2021-02-20 LAB — BASIC METABOLIC PANEL
BUN: 20 mg/dL (ref 6–23)
CO2: 25 mEq/L (ref 19–32)
Calcium: 9.7 mg/dL (ref 8.4–10.5)
Chloride: 104 mEq/L (ref 96–112)
Creatinine, Ser: 1.9 mg/dL — ABNORMAL HIGH (ref 0.40–1.50)
GFR: 31.71 mL/min — ABNORMAL LOW (ref >60.00)
Glucose, Bld: 136 mg/dL — ABNORMAL HIGH (ref 70–99)
Potassium: 4.5 mEq/L (ref 3.5–5.1)
Sodium: 140 mEq/L (ref 135–145)

## 2021-02-20 LAB — TSH: TSH: 3.05 u[IU]/mL (ref 0.35–5.50)

## 2021-02-20 MED ORDER — LOKELMA 10 G PO PACK: 10.0000 g | PACK | Freq: Every day | ORAL | 0 refills | Status: DC

## 2021-02-20 NOTE — Addendum Note (Signed)
Addended byDamita Dunnings D on: 02/20/2021 02:28 PM   Modules accepted: Orders

## 2021-02-21 ENCOUNTER — Telehealth: Payer: Self-pay | Admitting: Internal Medicine

## 2021-02-21 DIAGNOSIS — N1330 Unspecified hydronephrosis: Secondary | ICD-10-CM

## 2021-02-21 DIAGNOSIS — N289 Disorder of kidney and ureter, unspecified: Secondary | ICD-10-CM

## 2021-02-21 NOTE — Telephone Encounter (Signed)
Urology referral placed. Pt made aware of results.

## 2021-02-21 NOTE — Telephone Encounter (Signed)
PCP aware

## 2021-02-21 NOTE — Telephone Encounter (Signed)
Renal ultrasound: Severe R hydronephrosis Complex cyst left kidney.  Please arrange a urology referral ASAP: Dx hydronephrosis, decreased renal function. Notify patient of above

## 2021-02-21 NOTE — Telephone Encounter (Signed)
[facility]: [phone]  Results: severe right sided hydronephrosis. Complex cystic area in left kidney indeterminate recommend further eval with MRI

## 2021-02-21 NOTE — Telephone Encounter (Signed)
Last read by Dimas Millin at 11:49 AM on 02/21/2021

## 2021-03-04 DIAGNOSIS — M810 Age-related osteoporosis without current pathological fracture: Secondary | ICD-10-CM

## 2021-03-04 DIAGNOSIS — E785 Hyperlipidemia, unspecified: Secondary | ICD-10-CM | POA: Diagnosis not present

## 2021-03-04 DIAGNOSIS — H5213 Myopia, bilateral: Secondary | ICD-10-CM | POA: Diagnosis not present

## 2021-03-04 DIAGNOSIS — H04123 Dry eye syndrome of bilateral lacrimal glands: Secondary | ICD-10-CM | POA: Diagnosis not present

## 2021-03-04 DIAGNOSIS — E119 Type 2 diabetes mellitus without complications: Secondary | ICD-10-CM | POA: Diagnosis not present

## 2021-03-04 DIAGNOSIS — H18413 Arcus senilis, bilateral: Secondary | ICD-10-CM | POA: Diagnosis not present

## 2021-03-04 DIAGNOSIS — Z7984 Long term (current) use of oral hypoglycemic drugs: Secondary | ICD-10-CM | POA: Diagnosis not present

## 2021-03-04 DIAGNOSIS — H40023 Open angle with borderline findings, high risk, bilateral: Secondary | ICD-10-CM | POA: Diagnosis not present

## 2021-03-04 DIAGNOSIS — H43813 Vitreous degeneration, bilateral: Secondary | ICD-10-CM | POA: Diagnosis not present

## 2021-03-04 DIAGNOSIS — H52203 Unspecified astigmatism, bilateral: Secondary | ICD-10-CM | POA: Diagnosis not present

## 2021-03-04 DIAGNOSIS — H40053 Ocular hypertension, bilateral: Secondary | ICD-10-CM | POA: Diagnosis not present

## 2021-03-04 DIAGNOSIS — H43393 Other vitreous opacities, bilateral: Secondary | ICD-10-CM | POA: Diagnosis not present

## 2021-03-04 DIAGNOSIS — E1169 Type 2 diabetes mellitus with other specified complication: Secondary | ICD-10-CM

## 2021-03-04 DIAGNOSIS — E039 Hypothyroidism, unspecified: Secondary | ICD-10-CM

## 2021-03-04 DIAGNOSIS — H524 Presbyopia: Secondary | ICD-10-CM | POA: Diagnosis not present

## 2021-03-04 LAB — HM DIABETES EYE EXAM

## 2021-03-05 DIAGNOSIS — N281 Cyst of kidney, acquired: Secondary | ICD-10-CM | POA: Diagnosis not present

## 2021-03-05 DIAGNOSIS — D631 Anemia in chronic kidney disease: Secondary | ICD-10-CM | POA: Diagnosis not present

## 2021-03-05 DIAGNOSIS — N179 Acute kidney failure, unspecified: Secondary | ICD-10-CM | POA: Diagnosis not present

## 2021-03-05 DIAGNOSIS — N1832 Chronic kidney disease, stage 3b: Secondary | ICD-10-CM | POA: Diagnosis not present

## 2021-03-05 DIAGNOSIS — Z23 Encounter for immunization: Secondary | ICD-10-CM | POA: Diagnosis not present

## 2021-03-05 DIAGNOSIS — E875 Hyperkalemia: Secondary | ICD-10-CM | POA: Diagnosis not present

## 2021-03-05 DIAGNOSIS — N1330 Unspecified hydronephrosis: Secondary | ICD-10-CM | POA: Diagnosis not present

## 2021-03-05 DIAGNOSIS — N189 Chronic kidney disease, unspecified: Secondary | ICD-10-CM | POA: Diagnosis not present

## 2021-03-05 LAB — COMPREHENSIVE METABOLIC PANEL
Albumin: 4.5 (ref 3.5–5.0)
Calcium: 9.7 (ref 8.7–10.7)
GFR calc non Af Amer: 39

## 2021-03-05 LAB — BASIC METABOLIC PANEL
BUN: 21 (ref 4–21)
CO2: 25 — AB (ref 13–22)
Chloride: 103 (ref 99–108)
Creatinine: 1.7 — AB (ref 0.6–1.3)
Glucose: 121
Potassium: 4.8 (ref 3.4–5.3)
Sodium: 140 (ref 137–147)

## 2021-03-05 LAB — CBC AND DIFFERENTIAL
HCT: 34 — AB (ref 41–53)
Hemoglobin: 11.2 — AB (ref 13.5–17.5)
Neutrophils Absolute: 2.9
Platelets: 194 (ref 150–399)
WBC: 5.8

## 2021-03-05 LAB — CBC: RBC: 4.16 (ref 3.87–5.11)

## 2021-03-13 ENCOUNTER — Other Ambulatory Visit: Payer: Self-pay | Admitting: *Deleted

## 2021-03-13 MED ORDER — ALCOHOL SWABS PADS: MEDICATED_PAD | 1 refills | Status: DC

## 2021-03-13 MED ORDER — ACCU-CHEK GUIDE W/DEVICE KIT: PACK | 0 refills | Status: DC

## 2021-04-01 ENCOUNTER — Encounter: Payer: Self-pay | Admitting: Internal Medicine

## 2021-04-03 ENCOUNTER — Other Ambulatory Visit (INDEPENDENT_AMBULATORY_CARE_PROVIDER_SITE_OTHER): Payer: Medicare Other

## 2021-04-03 ENCOUNTER — Other Ambulatory Visit: Payer: Self-pay

## 2021-04-03 DIAGNOSIS — E039 Hypothyroidism, unspecified: Secondary | ICD-10-CM

## 2021-04-03 LAB — TSH: TSH: 1.15 u[IU]/mL (ref 0.35–5.50)

## 2021-04-05 ENCOUNTER — Ambulatory Visit (INDEPENDENT_AMBULATORY_CARE_PROVIDER_SITE_OTHER): Payer: Medicare Other | Admitting: Pharmacist

## 2021-04-05 DIAGNOSIS — N289 Disorder of kidney and ureter, unspecified: Secondary | ICD-10-CM

## 2021-04-05 DIAGNOSIS — I2510 Atherosclerotic heart disease of native coronary artery without angina pectoris: Secondary | ICD-10-CM

## 2021-04-05 DIAGNOSIS — E785 Hyperlipidemia, unspecified: Secondary | ICD-10-CM

## 2021-04-05 DIAGNOSIS — E039 Hypothyroidism, unspecified: Secondary | ICD-10-CM

## 2021-04-05 DIAGNOSIS — E1169 Type 2 diabetes mellitus with other specified complication: Secondary | ICD-10-CM

## 2021-04-05 DIAGNOSIS — M810 Age-related osteoporosis without current pathological fracture: Secondary | ICD-10-CM

## 2021-04-05 MED ORDER — SITAGLIPTIN PHOSPHATE 50 MG PO TABS: 50.0000 mg | ORAL_TABLET | Freq: Every day | ORAL | 1 refills | Status: DC

## 2021-04-05 NOTE — Patient Instructions (Signed)
[name],  It was a pleasure speaking with you today.  I have attached a summary of our visit today and information about your health goals.   Our next appointment is by telephone on June 06, 2021  at 1:45pm  Please call the care guide team at [phone] if you need to cancel or reschedule your appointment.    If you have any questions or concerns, please feel free to contact me either at the phone number below or with a MyChart message.   Keep up the good work!  [name], PharmD Clinical Pharmacist [place] [phone] (direct line)  [phone] (main office number)   Patient verbalizes understanding of instructions provided today and agrees to view in [place].     CARE PLAN ENTRY (see longitudinal plan of care for additional care plan information)  Current Barriers:  Chronic Disease Management support, education, and care coordination needs related to Diabetes, Hyperlipidemia, Hypothyroidism, Osteoporosis   Hyperlipidemia Lab Results  Component Value Date/Time   LDLCALC 56 09/06/2020 09:35 AM   Pharmacist Clinical Goal(s): Over the next 90 days, patient will work with PharmD and providers to maintain LDL goal < 70 Current regimen:  Atorvastatin 40mg  daily  Ezetimibe 10mg  daily Interventions: Emphasized the importance of taking both cholesterol medications daily Discussed that each of his cholesterol medications work differently but together to help lower LDL cholesterol and prevent heart disease Patient self care activities - Over the next 90 days, patient will: Restart Ezetimibe 10mg  daily - refill was requested from [facility] to take atorvastatin 40mg  daily  Diabetes Lab Results  Component Value Date/Time   HGBA1C 7.1 (H) 02/11/2021 09:47 AM   HGBA1C 6.9 (H) 09/06/2020 09:35 AM   Pharmacist Clinical Goal(s): Over the next 90 days, patient will work with PharmD and providers to maintain A1c goal  <7% Current regimen:  Metformin 500mg  - take 1 tablets twice daily  Januvia 50mg  daily Interventions: Emphasized the importance of med adherence Discussed A1c goals Updated prescription at [facility] for Januvia 50mg  strength (patient will no longer have to halve 100mg  tablets) Patient self care activities - Over the next 90 days, patient will: Check blood sugar once daily, document, and provide at future appointments; Call office if you have readings less than 80 or over 200. Contact provider with any episodes of hypoglycemia Remember to have yearly eye exam Home blood glucose goals  Fasting blood glucose goal (before meals) = 80 to 130 Blood glucose goal after a meal = less than 180    Hypothyroidism Pharmacist Clinical Goal(s) Over the next 90 days, patient will work with PharmD and providers to maintain TSH within normal limits Current regimen:  Levothyroxine 154mcg daily Interventions: Emphasized the importance of med adherence Patient self care activities - Over the next 90 days, patient will: Start new dose of levothyroxine 150mcg daily  Osteoporosis Pharmacist Clinical Goal(s) Over the next 90 days, patient will work with PharmD and providers to reduce risk of fracture due to osteoporosis  Current regimen:  Alendronate 70mg  (continue to not take this medication for now)  Calcium 500mg  daily/Vitamin D 5000 units daily  Interventions: Consider intake of 1200mg  of calcium daily through diet and/or supplementation Consider intake of [phone] units of vitamin D through supplementation  Discussed fall prevention Patient self care activities - Over the next 90 days, patient will: Ensure daily intake of at least 1200mg  of calcium daily through diet and/or supplementation Ensure daily intake of at least 1000 units  of vitamin D through supplementation  Do not take alendronate until further instruction  Medication management Pharmacist Clinical Goal(s): Over the next 90 days,  patient will work with PharmD and providers to achieve optimal medication adherence Current pharmacy: [facility] Order Interventions Comprehensive medication review performed. Continue current medication management strategy Patient self care activities - Over the next 90 days, patient will: Focus on medication adherence by filling and taking medications appropriately  Take medications as prescribed Report any questions or concerns to PharmD and/or provider(s)  Please see past updates related to this goal by clicking on the "Past Updates" button in the selected goal

## 2021-04-05 NOTE — Chronic Care Management (AMB) (Signed)
Chronic Care Management Pharmacy Note  04/05/2021 Name:  Marco [name] MRN:  [id number] DOB:  May 08, 1933  Summary: eGFR low but stabilizing. Patient has seen nephrologist Patient is taking atorvastatin daily but has not filled ezetimibe. He did not realize he needed to take both   Home blood glucose was been in range.  Recommendations/Changes made from today's visit: Continue to hold alendronate until further instructions due to CrCl <35 mL/min Could consider SGLT2 or Kerendia in future for CKD (at current renal function will likely have little effect at further lowering blood glucose) Updated prescription for Januvia 34m daily    Subjective: Marco [name] an 87y.o. year old male who is a primary patient of Paz, JAlda Berthold MD.  The CCM team was consulted for assistance with disease management and care coordination needs.    Engaged with patient by telephone for follow up visit in response to provider referral for pharmacy case management and/or care coordination services.   Consent to Services:  The patient was given information about Chronic Care Management services, agreed to services, and gave verbal consent prior to initiation of services.  Please see initial visit note for detailed documentation.   Patient Care Team: Marco Branch MD as PCP - General (Internal Medicine) TRoel Cluck MD as Referring Physician (Ophthalmology) ECherre Robins RPH-CPP (Pharmacist)  Recent office visits: 02/11/2021 - PCP (Dr PLarose Kells Seen for follow up chronic conditions. Labs drawn and noted to have decreased in eGFR and hyperkalemia. A1c and TSH also increased.  Prescribed Lokelma for hyperkalemia. Levothyroxine increased from 775m to 10029mdaily. Referred to nephrology. 09/06/2020 - PCP (Dr PazLarose KellsAnnual Physical exam. Labs ordered. No medicaiton changes   Recent consult visits:  03/2021 - [facility] Decreased Creatinine. Noted not available at this  time but some labs have been entered from visit with Dr SinCandiss Norse0/31/2022 - Ophthamology ([facility]) Diabetic Eye Exam. No retinopathy noted in either eye. Recommended OTC artificial tears for dry eye syndrome 07/20/2020 Dr DeaMarlou Sarthopedic surgery) Follow up. Joint injection of right knee of lidocaine 1 %/ Hyaluronate 60 MG/3ML. 07/05/2020 Dr. DavHassell Donetolaryngology) Initial Consult. Start CORTISPORIN otic  06/21/2020 Dr. DeaMarlou SaD (Orthopedic surgery) Bilateral knee pain. Right knee injection of  lidocaine 1 %.  methylPREDNISolone/ bupivacaine Follow up in 8 weeks.  Hospital visits: None in previous 6 months  Objective:  Lab Results  Component Value Date   CREATININE 1.7 (A) 03/05/2021   CREATININE 1.90 (H) 02/20/2021   CREATININE 1.91 (H) 02/11/2021    Lab Results  Component Value Date   HGBA1C 7.1 (H) 02/11/2021   Last diabetic Eye exam:  Lab Results  Component Value Date/Time   HMDIABEYEEXA No Retinopathy 02/23/2020 12:00 AM    Last diabetic Foot exam:  Lab Results  Component Value Date/Time   HMDIABFOOTEX Normal/c/o feet pain 03/08/2015 12:00 AM        Component Value Date/Time   CHOL 126 09/06/2020 0935   TRIG 88.0 09/06/2020 0935   HDL 52.60 09/06/2020 0935   CHOLHDL 2 09/06/2020 0935   VLDL 17.6 09/06/2020 0935   LDLCALC 56 09/06/2020 0935    Hepatic Function Latest Ref Rng & Units 03/05/2021 09/06/2020 11/01/2019  Total Protein 6.0 - 8.3 g/dL - 7.1 7.3  Albumin 3.5 - 5.0 4.5 4.2 4.2  AST 0 - 37 U/L - 23 31  ALT 0 - 53 U/L - 15 22  Alk Phosphatase 39 -  117 U/L - 86 104  Total Bilirubin 0.2 - 1.2 mg/dL - 0.7 0.7    Lab Results  Component Value Date/Time   TSH 1.15 04/03/2021 09:27 AM   TSH 3.05 02/20/2021 09:31 AM   FREET4 0.72 10/01/2017 03:59 PM    CBC Latest Ref Rng & Units 03/05/2021 02/11/2021 09/06/2020  WBC - 5.8 5.3 5.0  Hemoglobin 13.5 - 17.5 11.2(A) 11.4(L) 12.0(L)  Hematocrit 41 - 53 34(A) 35.6(L) 37.3(L)  Platelets 150 -  399 194 165.0 188.0    No results found for: VD25OH  Clinical ASCVD: Yes  The ASCVD Risk score (Arnett DK, et al., 2019) failed to calculate for the following reasons:   The 2019 ASCVD risk score is only valid for ages 23 to 69    Other:  DEXA 11/17/2019 Lowest T-Score at right femoral neck = -1.5 (Compared to 09/07/2013 score was -1.9) Patient classified as osteoporosis due to h/o of hip fracture.   Social History   Tobacco Use  Smoking Status Never  Smokeless Tobacco Never   BP Readings from Last 3 Encounters:  02/11/21 132/80  09/06/20 122/72  03/05/20 110/74   Pulse Readings from Last 3 Encounters:  02/11/21 67  09/06/20 (!) 58  03/05/20 79   Wt Readings from Last 3 Encounters:  02/11/21 146 lb 6 oz (66.4 kg)  09/06/20 138 lb 6 oz (62.8 kg)  03/05/20 145 lb 2 oz (65.8 kg)    Assessment: Review of patient past medical history, allergies, medications, health status, including review of consultants reports, laboratory and other test data, was performed as part of comprehensive evaluation and provision of chronic care management services.   SDOH:  (Social Determinants of Health) assessments and interventions performed:  SDOH Interventions    Flowsheet Row Most Recent Value  SDOH Interventions   Financial Strain Interventions Intervention Not Indicated        CCM Care Plan  No Known Allergies  Medications Reviewed Today     Reviewed by [name], RPH-CPP (Pharmacist) on 04/05/21 at 1402  Med List Status: <None>   Medication Order Taking? Sig Documenting Provider Last Dose Status Informant  ACCU-CHEK GUIDE test strip [id number] Yes CHECK BLOOD SUGAR ONCE  DAILY. Colon Branch, MD Taking Active   Accu-Chek Softclix Lancets lancets [id number] Yes CHECK BLOOD SUGARS ONCE  DAILY AS DIRECTED Colon Branch, MD Taking Active   Alcohol Swabs PADS [id number] Yes Use to clean finger prior to testing blood sugars Colon Branch, MD Taking Active   alendronate (FOSAMAX) 70 MG  tablet [id number] No Take 1 tablet (70 mg total) by mouth once a week. Take with a full glass of water on an empty stomach.  Patient not taking: Reported on 04/05/2021   Colon Branch, MD Not Taking Active            Med Note Antony Contras, Adria Dill Feb 14, 2021  8:27 AM) Recommended hold starting 02/14/2021 due to decrease in kidney function  aspirin EC 81 MG EC tablet [id number] No Take 1 tablet (81 mg total) by mouth daily.  Patient not taking: Reported on 04/05/2021   Murlean Iba, MD Not Taking Active   atorvastatin (LIPITOR) 40 MG tablet [id number] Yes Take 1 tablet (40 mg total) by mouth at bedtime. Colon Branch, MD Taking Active   Blood Glucose Calibration (ACCU-CHEK GUIDE CONTROL) Yehuda Budd [id number] Yes Use as directed Colon Branch, MD Taking Active   Blood Glucose Monitoring Suppl (ACCU-CHEK GUIDE)  w/Device KIT [id number] Yes Check blood sugars once daily Colon Branch, MD Taking Active   Calcium Carb-Cholecalciferol (CALCIUM 500 + D) 500-125 MG-UNIT TABS [id number] Yes Take 1 tablet by mouth daily. [provider] Taking Active   ezetimibe (ZETIA) 10 MG tablet [id number] No Take 1 tablet (10 mg total) by mouth daily.  Patient not taking: Reported on 04/05/2021   Colon Branch, MD Not Taking Active   levothyroxine (SYNTHROID) 100 MCG tablet [id number] Yes Take 1 tablet (100 mcg total) by mouth daily before breakfast. Colon Branch, MD Taking Active   metFORMIN (GLUCOPHAGE) 500 MG tablet [id number] Yes Take 1 tablet (500 mg total) by mouth 2 (two) times daily with a meal. Colon Branch, MD Taking Active   nitroGLYCERIN (NITROSTAT) 0.4 MG SL tablet [id number] Yes Place 1 tablet (0.4 mg total) under the tongue every 5 (five) minutes x 3 doses as needed for chest pain. Colon Branch, MD Taking Active            Med Note Alinda Money, [place] Mar 05, 2020  9:04 AM) PRN  OVER THE COUNTER MEDICATION [id number] Yes Taking laxative from [place] as needed [provider] Taking Active   sitaGLIPtin  (JANUVIA) 100 MG tablet [id number] Yes Take 0.5 tablets (50 mg total) by mouth daily. Colon Branch, MD Taking Active   sodium zirconium cyclosilicate (LOKELMA) 10 g PACK packet [id number] Yes Take 10 g by mouth daily.  Patient taking differently: Take 10 g by mouth daily as needed.   Colon Branch, MD Taking Active            Med Note Antony Contras, [place] B   Fri Apr 05, 2021  2:01 PM) Patient taking 1 or 2 times per week  vitamin B-12 (CYANOCOBALAMIN) 1000 MCG tablet [id number] Yes Take 1,000 mcg by mouth daily. [provider] Taking Active             Patient Active Problem List   Diagnosis Date Noted   CKD (chronic kidney disease) stage 3, GFR 30-59 ml/min ([place]) 02/11/2021   CAD (coronary artery disease) 03/06/2020   Hypothyroidism 12/02/2017   Chronic pain of right knee 10/13/2017   Chest pain 11/19/2016   Amaurosis fugax 12/12/2015   PCP NOTES >>> 03/08/2015   Osteoporosis 09/15/2014   Primary osteoarthritis of both knees 03/16/2014   Hearing difficulty 10/20/2013   DJD (degenerative joint disease) 08/25/2013   Neck mass 04/25/2013   Hyperlipidemia 04/25/2013   Erectile dysfunction 12/26/2011   Annual physical exam 08/22/2011   Prostate nodule 08/22/2011   Diabetes mellitus ([place]) 05/07/2011    Immunization History  Administered Date(s) Administered   Influenza-Unspecified 03/05/2021   PFIZER(Purple Top)SARS-COV-2 Vaccination 05/20/2019, 06/10/2019, 02/02/2020    Conditions to be addressed/monitored: CAD, HLD, DMII, and hypothyroidism; CKD; constipation; osteoporosis  Patient Care Plan: General Pharmacy (Adult)     Problem Identified: type 2 DM; hyperlipidemia; CAD; ED; osteoporosis; hypothyroidism; CKD; OA / DJD; constipation   Priority: High     Long-Range Goal: Medication and Chronic Care Management   Start Date: 10/24/2020  Recent Progress: On track  Priority: High  Note:   Current Barriers:  Does not adhere to prescribed medication regimen Does not  contact provider office for questions/concerns Changing renal function   Pharmacist Clinical Goal(s):  Over the next 90 days, patient will achieve adherence to monitoring guidelines and medication adherence to achieve therapeutic efficacy maintain control of hyperlipidemia and type 2 DM as evidenced  by maintaining LDL <70 and A1c < 7.0%  adhere to prescribed medication regimen as evidenced by fill history and pill count  through collaboration with PharmD and provider.  Monitor renal function and adjust medication therapy as needed  Interventions: 1:1 collaboration with Colon Branch, MD regarding development and update of comprehensive plan of care as evidenced by provider attestation and co-signature Inter-disciplinary care team collaboration (see longitudinal plan of care) Comprehensive medication review performed; medication list updated in electronic medical record  Hyperlipidemia Controlled;  LDL goal < 70 Current regimen:  Atorvastatin 65m daily  Ezetimibe 173mdaily Tolerating regimen well without side effects Noted that ezetimibe had not been filled since April. Patient states he was not sure if he was supposed to take both atorvastatin and ezetimibe.  Interventions: Emphasized the importance of taking both cholesterol medications daily. Education provided on the different mechanisms by which atorvastatin and ezetimibe lower cholesterol Restart ezetimibe 1052maily - coordinated refill at pharmacy Reviewed LDL goal  Diabetes Controlled; A1c goal <7%  Lab Results  Component Value Date   CREATININE 1.7 (A) 03/05/2021   CREATININE 1.90 (H) 02/20/2021   CREATININE 1.91 (H) 02/11/2021  Most recent eGFR was 39 (previous eGFR = 31 mL/min) - improving Recent home BG readings: 107 to 120 Current regimen:  Metformin 500m55mtake 1 tablet twice a day (lowered 10/24/2020) Januvia 50mg51mly (dose lowered due to renal function 10/24/2020) Interventions: Reviewed dose of Januvia and  metformin since renal function has decreased more. No further dose adjustments needed currently.  Recommended to lower dose of Januvia to 25mg 61my if eGFR is < 30ml/m19mecommended to stop metformin if eFGR is < 30 mL/min Emphasized the importance of medication adherence Sent in updated prescription for Januvia 50mg da64mDiscussed A1c goals Continue to check blood glucose once daily, document, and provide at future appointments Reminded to have yearly eye exam  Hypothyroidism uncontrolled Goal: maintain TSH within normal limits Last TSH was in therapeutic range Lab Results  Component Value Date   TSH 1.15 04/03/2021  Current regimen:  Levothyroxine 100mcg da85m Interventions: Emphasized the importance of medication adherence  Osteoporosis Goal: reduce risk of fracture due to osteoporosis  History of hip fracture DEXA 11/17/2019: Lowest T-Score was -1.5 at right femoral neck  Compared to previous T-Score from 09/07/2013 of -1.9 has improved.  Current regimen:  Alendronate 70mg - ho91mg due to CrCL < 35 mL/min  Calcium 500mg daily43mamin D 5000 units daily  Interventions: Continue to hold alendronate Ensure daily intake of at least 1200mg of cal21m daily through diet and/or supplementation Ensure daily intake of at least 1000 units of vitamin D through supplementation   CKD - 3B Renal function improved a little Had hyperkalemia with K+ of 5.6 but has been normal the last 2 times checked Taking Lokelma - 1 packet 1 or 2 times per week (per nephrologist) Patient is following low potassium diet Managed by Dr Singh IntervCandiss Norsen:  See above for dose adjustments / discontinuations based on renal function.  Future options in future for CKD - SGLT2 (for blood glucose and CKD) or Kerendia (woCarrington Clamp to monitor potassium closely)   Medication management Pharmacist Clinical Goal(s): Over the next 90 days, patient will work with PharmD and providers to achieve optimal  medication adherence Current pharmacy: [facility] Interventions Comprehensive medication review performed. Continue current medication management strategy Reviewed refill history. Noted that ezetimibe had not been filled since April. Coordinated refill at [facility].  [facility] Over  the next 90 days, patient will:  take medications as prescribed, check glucose daily , document, and provide at future appointments, stop alendronate  Follow Up Plan: F/U with clinical pharmacist in 1 month to review renal function and medications         Medication Assistance: None required.  Patient affirms current coverage meets needs.  Patient's preferred pharmacy is:  [id number] - [place], [place] - [address] AT [place] [address] [place] [address] Phone: [phone] Fax: [phone]  [facility] ([place], [place] [facility] [address] [place] [address] [place] [address] Phone: [phone] Fax: [phone]  Follow Up:  Patient agrees to Care Plan and Follow-up.  Plan: Chronic Care Management pharmacist will check back with patient in 1 to 2 months  [name], PharmD Clinical Pharmacist [place] [phone]

## 2021-04-09 DIAGNOSIS — N281 Cyst of kidney, acquired: Secondary | ICD-10-CM | POA: Diagnosis not present

## 2021-04-09 DIAGNOSIS — N130 Hydronephrosis with ureteropelvic junction obstruction: Secondary | ICD-10-CM | POA: Diagnosis not present

## 2021-04-10 ENCOUNTER — Other Ambulatory Visit: Payer: Self-pay | Admitting: Urology

## 2021-04-10 DIAGNOSIS — N1330 Unspecified hydronephrosis: Secondary | ICD-10-CM

## 2021-04-10 DIAGNOSIS — N281 Cyst of kidney, acquired: Secondary | ICD-10-CM

## 2021-04-12 ENCOUNTER — Encounter: Payer: Self-pay | Admitting: Internal Medicine

## 2021-04-17 ENCOUNTER — Telehealth: Payer: Self-pay | Admitting: Internal Medicine

## 2021-04-17 DIAGNOSIS — R972 Elevated prostate specific antigen [PSA]: Secondary | ICD-10-CM

## 2021-04-17 NOTE — Telephone Encounter (Signed)
(  He has well-known increased PSA, has refused further evaluation for years).  Advise patient: We can check if so desired when he comes back At the last visit, he was referred to urology and nephrology.  Was he able to see the specialists?

## 2021-04-17 NOTE — Telephone Encounter (Signed)
Pt saw urology on 04/09/21 (note is scanned in media)- I believe he has seen nephrology as well, (note not in media yet)

## 2021-04-17 NOTE — Telephone Encounter (Signed)
Please advise 

## 2021-04-17 NOTE — Telephone Encounter (Signed)
Pt called and stated he would like his PSA rechecked. He stated it was checked at a previous provider of his and they stated it was high.

## 2021-04-18 NOTE — Telephone Encounter (Signed)
Noted.  Again we can obtain a PSA at the next visit

## 2021-04-18 NOTE — Telephone Encounter (Signed)
Spoke w/ Pt- he is scheduled w/ urology to do a prostate biopsy in the next few weeks, he stated that nephrology is wanting to an MRI and recommended he have another PSA checked prior to proceeding with biopsy, he is requesting to go ahead and check PSA now instead of on 05/14/21.

## 2021-04-18 NOTE — Telephone Encounter (Signed)
Spoke w/ Pt- lab appt scheduled for tomorrow at 10:15am. PSA ordered.

## 2021-04-18 NOTE — Telephone Encounter (Signed)
Ok to arrange.

## 2021-04-19 ENCOUNTER — Other Ambulatory Visit (INDEPENDENT_AMBULATORY_CARE_PROVIDER_SITE_OTHER): Payer: Medicare Other

## 2021-04-19 DIAGNOSIS — R972 Elevated prostate specific antigen [PSA]: Secondary | ICD-10-CM

## 2021-04-19 LAB — PSA: PSA: 970 ng/mL — ABNORMAL HIGH (ref 0.10–4.00)

## 2021-04-26 ENCOUNTER — Other Ambulatory Visit: Payer: Self-pay | Admitting: Internal Medicine

## 2021-05-01 ENCOUNTER — Other Ambulatory Visit: Payer: Medicare Other

## 2021-05-03 ENCOUNTER — Other Ambulatory Visit: Payer: Self-pay

## 2021-05-03 ENCOUNTER — Ambulatory Visit
Admission: EM | Admit: 2021-05-03 | Discharge: 2021-05-03 | Disposition: A | Discharge status: Home / Self Care | Payer: Medicare Other | Attending: Emergency Medicine | Admitting: Emergency Medicine

## 2021-05-03 ENCOUNTER — Encounter: Payer: Self-pay | Admitting: Emergency Medicine

## 2021-05-03 DIAGNOSIS — J069 Acute upper respiratory infection, unspecified: Secondary | ICD-10-CM | POA: Diagnosis not present

## 2021-05-03 DIAGNOSIS — J22 Unspecified acute lower respiratory infection: Secondary | ICD-10-CM | POA: Diagnosis not present

## 2021-05-03 MED ORDER — IPRATROPIUM BROMIDE 0.06 % NA SOLN
2.0000 | Freq: Four times a day (QID) | NASAL | 0 refills | Status: DC
Start: 2021-05-03 — End: 2021-10-30

## 2021-05-03 MED ORDER — ALBUTEROL SULFATE HFA 108 (90 BASE) MCG/ACT IN AERS: 2.0000 | INHALATION_SPRAY | Freq: Four times a day (QID) | RESPIRATORY_TRACT | 0 refills | Status: DC | PRN

## 2021-05-03 MED ORDER — AMOXICILLIN-POT CLAVULANATE 875-125 MG PO TABS
1.0000 | ORAL_TABLET | Freq: Two times a day (BID) | ORAL | 0 refills | Status: AC
Start: 2021-05-03 — End: 2021-05-08

## 2021-05-03 NOTE — Discharge Instructions (Addendum)
Your symptoms and physical exam findings are concerning for a lower respiratory tract infection that may be due to bacteria.  For this reason, please begin Augmentin 1 tablet twice daily for the next 5 days.  This medication is safe to take prior to your biopsy procedure.   You were tested for both COVID and influenza today because here in the urgent care setting, we do not have an available option for an individual influenza test.  I apologize for the redundancy if you have already taken a COVID test at home.  The result of your viral testing will be posted to your MyChart once it is complete, this typically takes 24 to 48 hours.  If there is a positive result, you will be contacted by phone with further recommendations, if any.    Conservative care is recommended at this time.  This includes rest, pushing clear fluids and activity as tolerated.  Warm beverages such as teas and broths versus cold beverages/popsicles and frozen sherbet/sorbet are personal choice, both warm and cold are beneficial.  You may also notice that your appetite is reduced; this is okay as long as you are drinking plenty of clear fluids.    Please see the list below for recommended medications, dosages and frequencies to provide relief of your current symptoms:     Ipratropium (Atrovent): This is an excellent nasal decongestant spray that does not cause rebound congestion, can be used up to 4 times daily as needed, instill 2 sprays into each nare with each use.  I have provided you with a prescription for this medication.   This medication is safe to use prior to your biopsy procedure.   Albuterol HFA: This is a bronchodilator, it relaxes the smooth muscles that constrict your airway in your lungs when you are feeling sick or having inflammation secondary to allergies.  Please inhale 2 puffs twice daily every day using the spacer provided.  You can also inhale 2 more puffs as often as needed throughout the day for aggravating  cough, chest tightness, feeling short of breath, wheezing.   This medication is safe to use prior to your biopsy procedure.   Please follow-up within the next 3 to 5 days either with your primary care provider or urgent care if your symptoms do not resolve.  If you do not have a primary care provider, we will assist you in finding one.

## 2021-05-03 NOTE — ED Triage Notes (Signed)
Patient presents to [facility] for evaluation of 3-4 days of cough, nasal congestion and sore throat.  Negative at home [place] last night

## 2021-05-03 NOTE — ED Provider Notes (Signed)
[facility]    CSN: [id number] Arrival date & time: 05/03/21  4166    HISTORY   Chief Complaint  Patient presents with   URI   HPI Marco [name] is a 87 y.o. male. Patient presents to [facility] for evaluation of 3-4 days of nonproductive cough, nasal congestion and sore throat.  Negative at home [place] last night.  Patient has a history of type 2 diabetes, renal failure and hypothyroidism.  Patient denies nausea, vomiting, diarrhea, fever, aches, chills.  Patient has not tried any home remedies for this.  The history is provided by the patient.  Past Medical History:  Diagnosis Date   Amaurosis fugax 2017   Diabetes mellitus    DJD (degenerative joint disease) 08/25/2013   Elevated PSA    Other and unspecified hyperlipidemia 04/25/2013   Patient Active Problem List   Diagnosis Date Noted   CKD (chronic kidney disease) stage 3, GFR 30-59 ml/min ([place]) 02/11/2021   CAD (coronary artery disease) 03/06/2020   Hypothyroidism 12/02/2017   Chronic pain of right knee 10/13/2017   Chest pain 11/19/2016   Amaurosis fugax 12/12/2015   PCP NOTES >>> 03/08/2015   Osteoporosis 09/15/2014   Primary osteoarthritis of both knees 03/16/2014   Hearing difficulty 10/20/2013   DJD (degenerative joint disease) 08/25/2013   Neck mass 04/25/2013   Hyperlipidemia 04/25/2013   Erectile dysfunction 12/26/2011   Annual physical exam 08/22/2011   Prostate nodule 08/22/2011   Diabetes mellitus ([place]) 05/07/2011   Past Surgical History:  Procedure Laterality Date   CATARACT EXTRACTION     Bilaterally   TYMPANIC MEMBRANE REPAIR     R    Home Medications    Prior to Admission medications   Medication Sig Start Date End Date Taking? Authorizing Provider  ACCU-CHEK GUIDE test strip CHECK BLOOD SUGAR ONCE  DAILY. 12/12/20   Colon Branch, MD  Accu-Chek Softclix Lancets lancets CHECK BLOOD SUGARS ONCE  DAILY AS DIRECTED 12/12/20   Colon Branch, MD  Alcohol Swabs PADS Use to clean finger  prior to testing blood sugars 03/13/21   Colon Branch, MD  alendronate (FOSAMAX) 70 MG tablet TAKE 1 TABLET(70 MG) BY MOUTH 1 TIME A WEEK WITH A FULL GLASS OF WATER AND ON AN EMPTY STOMACH 04/30/21   Colon Branch, MD  aspirin EC 81 MG EC tablet Take 1 tablet (81 mg total) by mouth daily. Patient not taking: Reported on 04/05/2021 11/22/16   Murlean Iba, MD  atorvastatin (LIPITOR) 40 MG tablet Take 1 tablet (40 mg total) by mouth at bedtime. 08/13/20   Colon Branch, MD  Blood Glucose Calibration (ACCU-CHEK GUIDE CONTROL) LIQD Use as directed 12/24/20   Colon Branch, MD  Blood Glucose Monitoring Suppl (ACCU-CHEK GUIDE) w/Device KIT Check blood sugars once daily 03/13/21   Colon Branch, MD  Calcium Carb-Cholecalciferol (CALCIUM 500 + D) 500-125 MG-UNIT TABS Take 1 tablet by mouth daily.    [provider]  ezetimibe (ZETIA) 10 MG tablet Take 1 tablet (10 mg total) by mouth daily. Patient not taking: Reported on 04/05/2021 11/22/20   Colon Branch, MD  levothyroxine (SYNTHROID) 100 MCG tablet Take 1 tablet (100 mcg total) by mouth daily before breakfast. 02/12/21   Colon Branch, MD  metFORMIN (GLUCOPHAGE) 500 MG tablet Take 1 tablet (500 mg total) by mouth 2 (two) times daily with a meal. 02/07/21   Colon Branch, MD  nitroGLYCERIN (NITROSTAT) 0.4 MG SL tablet Place  1 tablet (0.4 mg total) under the tongue every 5 (five) minutes x 3 doses as needed for chest pain. 04/18/19   Colon Branch, MD  OVER THE COUNTER MEDICATION Taking laxative from [place] as needed    [provider]  sitaGLIPtin (JANUVIA) 50 MG tablet Take 1 tablet (50 mg total) by mouth daily. 04/05/21   Colon Branch, MD  sodium zirconium cyclosilicate (LOKELMA) 10 g PACK packet Take 10 g by mouth daily. Patient taking differently: Take 10 g by mouth daily as needed. 02/20/21   Colon Branch, MD  vitamin B-12 (CYANOCOBALAMIN) 1000 MCG tablet Take 1,000 mcg by mouth daily.    [provider]   Family History Family History   Problem Relation Age of Onset   Other Mother        Unknown    Other Father        Unknown   Diabetes Neg Hx    CAD Neg Hx    Cancer Neg Hx    Social History Social History   Tobacco Use   Smoking status: Never   Smokeless tobacco: Never  Substance Use Topics   Alcohol use: No   Drug use: No   Allergies   Patient has no known allergies.  Review of Systems Review of Systems Pertinent findings noted in history of present illness.   Physical Exam Triage Vital Signs ED Triage Vitals  Enc Vitals Group     BP 03/01/21 0827 (!) 147/82     Pulse Rate 03/01/21 0827 72     Resp 03/01/21 0827 18     Temp 03/01/21 0827 98.3 F (36.8 C)     Temp Source 03/01/21 0827 Oral     SpO2 03/01/21 0827 98 %     Weight --      Height --      Head Circumference --      Peak Flow --      Pain Score 03/01/21 0826 5     Pain Loc --      Pain Edu? --      Excl. in [place]? --   No data found.  Updated Vital Signs BP (!) 146/73 (BP Location: Left Arm)    Pulse 72    Temp 98.1 F (36.7 C) (Oral)    Resp 18    SpO2 96%   Physical Exam Vitals and nursing note reviewed.  Constitutional:      General: He is not in acute distress.    Appearance: Normal appearance. He is not ill-appearing.  HENT:     Head: Normocephalic and atraumatic.     Salivary Glands: Right salivary gland is not diffusely enlarged or tender. Left salivary gland is not diffusely enlarged or tender.     Right Ear: Tympanic membrane, ear canal and external ear normal. No drainage. No middle ear effusion. There is no impacted cerumen. Tympanic membrane is not erythematous or bulging.     Left Ear: Tympanic membrane, ear canal and external ear normal. No drainage.  No middle ear effusion. There is no impacted cerumen. Tympanic membrane is not erythematous or bulging.     Nose: Nose normal. No nasal deformity, septal deviation, mucosal edema, congestion or rhinorrhea.     Right Turbinates: Not enlarged, swollen or pale.      Left Turbinates: Not enlarged, swollen or pale.     Right Sinus: No maxillary sinus tenderness or frontal sinus tenderness.     Left Sinus: No maxillary sinus  tenderness or frontal sinus tenderness.     Mouth/Throat:     Lips: Pink. No lesions.     Mouth: Mucous membranes are moist. No oral lesions.     Pharynx: Oropharynx is clear. Uvula midline. No posterior oropharyngeal erythema or uvula swelling.     Tonsils: No tonsillar exudate. 0 on the right. 0 on the left.  Eyes:     General: Lids are normal.        Right eye: No discharge.        Left eye: No discharge.     Extraocular Movements: Extraocular movements intact.     Conjunctiva/sclera: Conjunctivae normal.     Right eye: Right conjunctiva is not injected.     Left eye: Left conjunctiva is not injected.  Neck:     Trachea: Trachea and phonation normal.  Cardiovascular:     Rate and Rhythm: Normal rate and regular rhythm.     Pulses: Normal pulses.     Heart sounds: Normal heart sounds. No murmur heard.   No friction rub. No gallop.  Pulmonary:     Effort: Pulmonary effort is normal. No accessory muscle usage, prolonged expiration or respiratory distress.     Breath sounds: No stridor, decreased air movement or transmitted upper airway sounds. Examination of the right-middle field reveals rales. Examination of the right-lower field reveals rales. Rales present. No decreased breath sounds, wheezing or rhonchi.  Chest:     Chest wall: No tenderness.  Musculoskeletal:        General: Normal range of motion.     Cervical back: Normal range of motion and neck supple. Normal range of motion.  Lymphadenopathy:     Cervical: No cervical adenopathy.  Skin:    General: Skin is warm and dry.     Findings: No erythema or rash.  Neurological:     General: No focal deficit present.     Mental Status: He is alert and oriented to person, place, and time.  Psychiatric:        Mood and Affect: Mood normal.        Behavior: Behavior  normal.    Visual Acuity Right Eye Distance:   Left Eye Distance:   Bilateral Distance:    Right Eye Near:   Left Eye Near:    Bilateral Near:     UC Couse / Diagnostics / Procedures:    EKG  Radiology No results found.  Procedures Procedures (including critical care time)  UC Diagnoses / Final Clinical Impressions(s)   I have reviewed the triage vital signs and the nursing notes.  Pertinent labs & imaging results that were available during my care of the patient were reviewed by me and considered in my medical decision making (see chart for details).   Final diagnoses:  Viral upper respiratory illness  Lower respiratory infection (e.g., bronchitis, pneumonia, pneumonitis, pulmonitis)   Physical exam findings concerning for possible pneumonia.  Patient to begin Augmentin, albuterol and Atrovent nasal spray.  Return precautions advised  ED Prescriptions     Medication Sig Dispense Auth. Provider   amoxicillin-clavulanate (AUGMENTIN) 875-125 MG tablet Take 1 tablet by mouth every 12 (twelve) hours for 5 days. 10 tablet Lynden Oxford Scales, PA-C   albuterol (VENTOLIN HFA) 108 (90 Base) MCG/ACT inhaler Inhale 2 puffs into the lungs every 6 (six) hours as needed for wheezing or shortness of breath (Cough). 18 g Lynden Oxford Scales, PA-C   ipratropium (ATROVENT) 0.06 % nasal spray Place 2 sprays into  both nostrils 4 (four) times daily. As needed for nasal congestion, runny nose 15 mL Lynden Oxford Scales, PA-C      PDMP not reviewed this encounter.  Pending results:  Labs Reviewed  COVID-19, FLU A+B NAA    Medications Ordered in UC: Medications - No data to display  Disposition Upon Discharge:  Condition: stable for discharge home Home: take medications as prescribed; routine discharge instructions as discussed; follow up as advised.  Patient presented with an acute illness with associated systemic symptoms and significant discomfort requiring urgent  management. In my opinion, this is a condition that a prudent lay person (someone who possesses an average knowledge of health and medicine) may potentially expect to result in complications if not addressed urgently such as respiratory distress, impairment of bodily function or dysfunction of bodily organs.   Routine symptom specific, illness specific and/or disease specific instructions were discussed with the patient and/or caregiver at length.   As such, the patient has been evaluated and assessed, work-up was performed and treatment was provided in alignment with urgent care protocols and evidence based medicine.  Patient/parent/caregiver has been advised that the patient may require follow up for further testing and treatment if the symptoms continue in spite of treatment, as clinically indicated and appropriate.  If the patient was tested for COVID-19, Influenza and/or RSV, then the patient/parent/guardian was advised to isolate at home pending the results of his/her diagnostic coronavirus test and potentially longer if theyre positive. I have also advised pt that if his/her COVID-19 test returns positive, it's recommended to self-isolate for at least 10 days after symptoms first appeared AND until fever-free for 24 hours without fever reducer AND other symptoms have improved or resolved. Discussed self-isolation recommendations as well as instructions for household member/close contacts as per the [facility] and [place] DHHS, and also gave patient the [place] packet with this information.  Patient/parent/caregiver has been advised to return to the [facility] or PCP in 3-5 days if no better; to PCP or the Emergency Department if new signs and symptoms develop, or if the current signs or symptoms continue to change or worsen for further workup, evaluation and treatment as clinically indicated and appropriate  The patient will follow up with their current PCP if and as advised. If the patient does not currently have a  PCP we will assist them in obtaining one.   The patient may need specialty follow up if the symptoms continue, in spite of conservative treatment and management, for further workup, evaluation, consultation and treatment as clinically indicated and appropriate.  Patient/parent/caregiver verbalized understanding and agreement of plan as discussed.  All questions were addressed during visit.  Please see discharge instructions below for further details of plan.  Discharge Instructions:   Discharge Instructions      Your symptoms and physical exam findings are concerning for a lower respiratory tract infection that may be due to bacteria.  For this reason, please begin Augmentin 1 tablet twice daily for the next 5 days.  This medication is safe to take prior to your biopsy procedure.   You were tested for both COVID and influenza today because here in the urgent care setting, we do not have an available option for an individual influenza test.  I apologize for the redundancy if you have already taken a COVID test at home.  The result of your viral testing will be posted to your MyChart once it is complete, this typically takes 24 to 48 hours.  If there is  a positive result, you will be contacted by phone with further recommendations, if any.    Conservative care is recommended at this time.  This includes rest, pushing clear fluids and activity as tolerated.  Warm beverages such as teas and broths versus cold beverages/popsicles and frozen sherbet/sorbet are personal choice, both warm and cold are beneficial.  You may also notice that your appetite is reduced; this is okay as long as you are drinking plenty of clear fluids.    Please see the list below for recommended medications, dosages and frequencies to provide relief of your current symptoms:     Ipratropium (Atrovent): This is an excellent nasal decongestant spray that does not cause rebound congestion, can be used up to 4 times daily as  needed, instill 2 sprays into each nare with each use.  I have provided you with a prescription for this medication.   This medication is safe to use prior to your biopsy procedure.   Albuterol HFA: This is a bronchodilator, it relaxes the smooth muscles that constrict your airway in your lungs when you are feeling sick or having inflammation secondary to allergies.  Please inhale 2 puffs twice daily every day using the spacer provided.  You can also inhale 2 more puffs as often as needed throughout the day for aggravating cough, chest tightness, feeling short of breath, wheezing.   This medication is safe to use prior to your biopsy procedure.   Please follow-up within the next 3 to 5 days either with your primary care provider or urgent care if your symptoms do not resolve.  If you do not have a primary care provider, we will assist you in finding one.      This office note has been dictated using Museum/gallery curator.  Unfortunately, and despite my best efforts, this method of dictation can sometimes lead to occasional typographical or grammatical errors.  I apologize in advance if this occurs.       Lynden Oxford Scales, [place] 05/06/21 [id number]

## 2021-05-04 DIAGNOSIS — E039 Hypothyroidism, unspecified: Secondary | ICD-10-CM | POA: Diagnosis not present

## 2021-05-04 DIAGNOSIS — I2510 Atherosclerotic heart disease of native coronary artery without angina pectoris: Secondary | ICD-10-CM

## 2021-05-04 DIAGNOSIS — M810 Age-related osteoporosis without current pathological fracture: Secondary | ICD-10-CM

## 2021-05-04 DIAGNOSIS — E785 Hyperlipidemia, unspecified: Secondary | ICD-10-CM | POA: Diagnosis not present

## 2021-05-04 DIAGNOSIS — E1169 Type 2 diabetes mellitus with other specified complication: Secondary | ICD-10-CM | POA: Diagnosis not present

## 2021-05-04 LAB — COVID-19, FLU A+B NAA
Influenza A, NAA: NOT DETECTED
Influenza B, NAA: NOT DETECTED
SARS-CoV-2, NAA: NOT DETECTED

## 2021-05-07 ENCOUNTER — Other Ambulatory Visit: Payer: Medicare Other

## 2021-05-14 ENCOUNTER — Ambulatory Visit (INDEPENDENT_AMBULATORY_CARE_PROVIDER_SITE_OTHER): Payer: Medicare Other | Admitting: Internal Medicine

## 2021-05-14 ENCOUNTER — Telehealth: Payer: Self-pay

## 2021-05-14 ENCOUNTER — Encounter: Payer: Self-pay | Admitting: Internal Medicine

## 2021-05-14 VITALS — BP 122/74 | HR 68 | Temp 98.0°F | Resp 18 | Ht 69.0 in | Wt 143.2 lb

## 2021-05-14 DIAGNOSIS — E039 Hypothyroidism, unspecified: Secondary | ICD-10-CM

## 2021-05-14 DIAGNOSIS — E1169 Type 2 diabetes mellitus with other specified complication: Secondary | ICD-10-CM

## 2021-05-14 DIAGNOSIS — N1330 Unspecified hydronephrosis: Secondary | ICD-10-CM | POA: Diagnosis not present

## 2021-05-14 DIAGNOSIS — E875 Hyperkalemia: Secondary | ICD-10-CM

## 2021-05-14 DIAGNOSIS — N179 Acute kidney failure, unspecified: Secondary | ICD-10-CM | POA: Diagnosis not present

## 2021-05-14 LAB — COMPREHENSIVE METABOLIC PANEL
ALT: 22 U/L (ref 0–53)
AST: 29 U/L (ref 0–37)
Albumin: 4.3 g/dL (ref 3.5–5.2)
Alkaline Phosphatase: 113 U/L (ref 39–117)
BUN: 26 mg/dL — ABNORMAL HIGH (ref 6–23)
CO2: 24 mEq/L (ref 19–32)
Calcium: 10.3 mg/dL (ref 8.4–10.5)
Chloride: 106 mEq/L (ref 96–112)
Creatinine, Ser: 1.84 mg/dL — ABNORMAL HIGH (ref 0.40–1.50)
GFR: 32.9 mL/min — ABNORMAL LOW (ref >60.00)
Glucose, Bld: 120 mg/dL — ABNORMAL HIGH (ref 70–99)
Potassium: 6.1 mEq/L (ref 3.5–5.1)
Sodium: 137 mEq/L (ref 135–145)
Total Bilirubin: 0.6 mg/dL (ref 0.2–1.2)
Total Protein: 7.5 g/dL (ref 6.0–8.3)

## 2021-05-14 LAB — CBC WITH DIFFERENTIAL/PLATELET
Basophils Absolute: 0 10*3/uL (ref 0.0–0.1)
Basophils Relative: 0.8 % (ref 0.0–3.0)
Eosinophils Absolute: 0.5 10*3/uL (ref 0.0–0.7)
Eosinophils Relative: 7.7 % — ABNORMAL HIGH (ref 0.0–5.0)
HCT: 37.8 % — ABNORMAL LOW (ref 39.0–52.0)
Hemoglobin: 11.9 g/dL — ABNORMAL LOW (ref 13.0–17.0)
Lymphocytes Relative: 23.7 % (ref 12.0–46.0)
Lymphs Abs: 1.5 10*3/uL (ref 0.7–4.0)
MCHC: 31.5 g/dL (ref 30.0–36.0)
MCV: 83.9 fl (ref 78.0–100.0)
Monocytes Absolute: 0.5 10*3/uL (ref 0.1–1.0)
Monocytes Relative: 7.7 % (ref 3.0–12.0)
Neutro Abs: 3.7 10*3/uL (ref 1.4–7.7)
Neutrophils Relative %: 60.1 % (ref 43.0–77.0)
Platelets: 223 10*3/uL (ref 150.0–400.0)
RBC: 4.51 Mil/uL (ref 4.22–5.81)
RDW: 14.4 % (ref 11.5–15.5)
WBC: 6.2 10*3/uL (ref 4.0–10.5)

## 2021-05-14 LAB — TSH: TSH: 4.62 u[IU]/mL (ref 0.35–5.50)

## 2021-05-14 LAB — HEMOGLOBIN A1C: Hgb A1c MFr Bld: 6.9 % — ABNORMAL HIGH (ref 4.6–6.5)

## 2021-05-14 MED ORDER — LOKELMA 10 G PO PACK: PACK | ORAL | 0 refills | Status: DC

## 2021-05-14 MED ORDER — NITROGLYCERIN 0.4 MG SL SUBL: 0.4000 mg | SUBLINGUAL_TABLET | SUBLINGUAL | 3 refills | Status: DC | PRN

## 2021-05-14 NOTE — Telephone Encounter (Signed)
Spoke w/ Pt- made him aware of results and recommendations. Informed I'd also send directions via mychart. He states he has some Lokelma packets at home- he is pretty sure they are 10g but will check. Lokelma 10g packets also sent to [facility]. Lab appt scheduled 05/24/2021 at 9:30am to recheck BMP. BMP ordered. Will fax results and OV tomorrow once OV note has been closed.

## 2021-05-14 NOTE — Addendum Note (Signed)
Addended byDamita Dunnings D on: 05/14/2021 03:41 PM   Modules accepted: Orders

## 2021-05-14 NOTE — Assessment & Plan Note (Signed)
Increased creatinine, hyperkalemia, right hydronephrosis: Since last visit, saw nephrology 03/05/2021, AKI felt to be due to R hydronephrosis. Saw urology 04/09/2021 for elevated PSA, right hydronephrosis, complex left renal cyst. At the time he only accepted to check a PSA (690) and a MRI of the abdomen and pelvis ( pending). Patient tells me that he talked to urology yesterday, no notes are available. Labs 03/05/2021, potassium 4.9, creatinine 1.7 Plan:  Recommend to reach out to nephrology to get a follow-up.  See AVS CMP, CBC.  Further advised with results DM: Currently on Januvia and metformin.  Check A1c.  Consider adjust medication doses based on creatinine. Hypothyroidism: On Synthroid, check TSH Osteoporosis: Holding Fosamax under the advice of the clinical pharmacist but is unclear is he is following her instructions. CAD: Denies symptoms, NTG refill sent, encouraged to refresh nitroglycerin every few months. Compliance: Recommend to set up a follow-up with our clinical pharmacist Preventive care: Recommend the COVID-vaccine, had a flu shot, declines PNM 20. Addendum: Potassium very elevated, see phone note.  Restarting Lokelma RTC 3 months

## 2021-05-14 NOTE — Telephone Encounter (Addendum)
Lokelma 10 g  TID daily x2 days, then 10  g daily until he sees nephrology or me. BMP in 10 days Other labs okay. Please fax results to patient's nephrologist along with this phone note

## 2021-05-14 NOTE — Telephone Encounter (Signed)
CRITICAL VALUE STICKER  CRITICAL VALUE: Potassium 6.1  RECEIVER (on-site recipient of call): Marwah Disbro, RMA  DATE & TIME NOTIFIED: 05/14/21 at 2:25 pm  MESSENGER (representative from lab): Deborra Medina  MD NOTIFIED: Larose Kells   TIME OF NOTIFICATION: 2:31 pm  RESPONSE:

## 2021-05-14 NOTE — Telephone Encounter (Signed)
OV note, labs, and this telephone note has been faxed to [facility].

## 2021-05-14 NOTE — Progress Notes (Signed)
Subjective:    Patient ID: Marco [name], male    DOB: 06-26-1933, 88 y.o.   MRN: [id number]  DOS:  05/14/2021 Type of visit - description: Follow-up  On the last visit, he was diagnosed with AKI, hyperkalemia, right hydronephrosis. Has seen nephrology once, saw urology 03/05/2021. He tells me he talked with urology yesterday but no notes are available.  Had a respiratory infection last week, went to urgent care, feeling better. Currently with no fever chills No chest pain or difficulty breathing.  Denies nausea, vomiting, diarrhea.  No abdominal pain No dysuria, gross hematuria or difficulty urinating  Wt Readings from Last 3 Encounters:  05/14/21 143 lb 4 oz (65 kg)  02/11/21 146 lb 6 oz (66.4 kg)  09/06/20 138 lb 6 oz (62.8 kg)     Review of Systems See above   Past Medical History:  Diagnosis Date   Amaurosis fugax 2017   Diabetes mellitus    DJD (degenerative joint disease) 08/25/2013   Elevated PSA    Other and unspecified hyperlipidemia 04/25/2013    Past Surgical History:  Procedure Laterality Date   CATARACT EXTRACTION     Bilaterally   TYMPANIC MEMBRANE REPAIR     R    Current Outpatient Medications  Medication Instructions   ACCU-CHEK GUIDE test strip CHECK BLOOD SUGAR ONCE  DAILY.   Accu-Chek Softclix Lancets lancets CHECK BLOOD SUGARS ONCE  DAILY AS DIRECTED   albuterol (VENTOLIN HFA) 108 (90 Base) MCG/ACT inhaler 2 puffs, Inhalation, Every 6 hours PRN   Alcohol Swabs PADS Use to clean finger prior to testing blood sugars   alendronate (FOSAMAX) 70 MG tablet TAKE 1 TABLET(70 MG) BY MOUTH 1 TIME A WEEK WITH A FULL GLASS OF WATER AND ON AN EMPTY STOMACH   aspirin 81 mg, Oral, Daily   atorvastatin (LIPITOR) 40 mg, Oral, Daily at bedtime   Blood Glucose Calibration (ACCU-CHEK GUIDE CONTROL) LIQD Use as directed   Blood Glucose Monitoring Suppl (ACCU-CHEK GUIDE) w/Device KIT Check blood sugars once daily   Calcium Carb-Cholecalciferol (CALCIUM 500 +  D) 500-125 MG-UNIT TABS 1 tablet, Oral, Daily   ezetimibe (ZETIA) 10 mg, Oral, Daily   ipratropium (ATROVENT) 0.06 % nasal spray 2 sprays, Each Nare, 4 times daily, As needed for nasal congestion, runny nose   levothyroxine (SYNTHROID) 100 mcg, Oral, Daily before breakfast   Lokelma 10 g, Oral, Daily   metFORMIN (GLUCOPHAGE) 500 mg, Oral, 2 times daily with meals   nitroGLYCERIN (NITROSTAT) 0.4 mg, Sublingual, Every 5 min x3 PRN   OVER THE COUNTER MEDICATION Taking laxative from [place] as needed   sitaGLIPtin (JANUVIA) 50 mg, Oral, Daily   vitamin B-12 (CYANOCOBALAMIN) 1,000 mcg, Oral, Daily       Objective:   Physical Exam BP 122/74 (BP Location: Left Arm, Patient Position: Sitting, Cuff Size: Small)    Pulse 68    Temp 98 F (36.7 C) (Oral)    Resp 18    Ht '5\' 9"'  (1.753 m)    Wt 143 lb 4 oz (65 kg)    SpO2 96%    BMI 21.15 kg/m  General:   Well developed, NAD, BMI noted.  HEENT:  Normocephalic . Face symmetric, atraumatic Lungs:  CTA B Normal respiratory effort, no intercostal retractions, no accessory muscle use. Heart: RRR,  no murmur.  Abdomen:  Not distended, soft, non-tender. No rebound or rigidity.  Specifically no R sided mass or tenderness Skin: Not pale. Not jaundice Lower extremities: Essentially no  edema today. Neurologic:  alert & oriented X3.  Speech normal, gait assisted by a cane, transfer is very limited due to DJD. Psych--  Cognition and judgment appear intact.  Cooperative with normal attention span and concentration.  Behavior appropriate. No anxious or depressed appearing.     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 Osteoporosis: T  score -1.5, hip fracture Osteopenia : DEXA 2015, Rx calcium and vitamin D.  DEXA 11-2019: Rx Fosamax ED Elevated PSA-prostate nodule:saw urology multiple times, last ~ 2013, declined a bx, declined further eval, aware of risks HOH  PLAN: Increased creatinine, hyperkalemia, right hydronephrosis: Since last visit, saw nephrology 03/05/2021, AKI felt to be due to R hydronephrosis. Saw urology 04/09/2021 for elevated PSA, right hydronephrosis, complex left renal cyst. At the time he only accepted to check a PSA (690) and a MRI of the abdomen and pelvis ( pending). Patient tells me that he talked to urology yesterday, no notes are available. Labs 03/05/2021, potassium 4.9, creatinine 1.7 Plan:  Recommend to reach out to nephrology to get a follow-up.  See AVS CMP, CBC.  Further advised with results DM: Currently on Januvia and metformin.  Check A1c.  Consider adjust medication doses based on creatinine. Hypothyroidism: On Synthroid, check TSH Osteoporosis: Holding Fosamax under the advice of the clinical pharmacist but is unclear is he is following her instructions. CAD: Denies symptoms, NTG refill sent, encouraged to refresh nitroglycerin every few months. Compliance: Recommend to set up a follow-up with our clinical pharmacist Preventive care: Recommend the COVID-vaccine, had a flu shot, declines PNM 20. Addendum: Potassium very elevated, see phone note.  Restarting Lokelma RTC 3 months  Total time spent with the patient today 45 minutes.  In addition to the visit, I addressed his critical labs today.  This visit occurred during the SARS-CoV-2 public health emergency.  Safety protocols were in place, including screening questions prior to the visit, additional usage of staff PPE, and extensive cleaning of exam room while observing appropriate contact time as indicated for disinfecting solutions.

## 2021-05-14 NOTE — Patient Instructions (Addendum)
Please call the nephrologist office and arrange for a follow-up.  The name of the doctor is Dr. Candiss Norse 336 [phone]  GO TO THE LAB : Get the blood work     [place], [place] back for   a checkup with me in 3 months   Arrange another visit with Vernona Rieger, or clinical pharmacist to help you with your medicines.

## 2021-05-17 ENCOUNTER — Ambulatory Visit
Admission: RE | Admit: 2021-05-17 | Discharge: 2021-05-17 | Disposition: A | Discharge status: Home / Self Care | Payer: Medicare Other | Source: Ambulatory Visit | Attending: Urology | Admitting: Urology

## 2021-05-17 DIAGNOSIS — R69 Illness, unspecified: Secondary | ICD-10-CM | POA: Diagnosis not present

## 2021-05-17 DIAGNOSIS — N3289 Other specified disorders of bladder: Secondary | ICD-10-CM | POA: Diagnosis not present

## 2021-05-17 DIAGNOSIS — N261 Atrophy of kidney (terminal): Secondary | ICD-10-CM | POA: Diagnosis not present

## 2021-05-17 DIAGNOSIS — N281 Cyst of kidney, acquired: Secondary | ICD-10-CM

## 2021-05-17 DIAGNOSIS — N1330 Unspecified hydronephrosis: Secondary | ICD-10-CM

## 2021-05-17 DIAGNOSIS — G589 Mononeuropathy, unspecified: Secondary | ICD-10-CM | POA: Diagnosis not present

## 2021-05-17 MED ORDER — GADOBENATE DIMEGLUMINE 529 MG/ML IV SOLN
13.0000 mL | Freq: Once | INTRAVENOUS | Status: AC | PRN
  Administered 2021-05-17: 13 mL via INTRAVENOUS

## 2021-05-20 ENCOUNTER — Other Ambulatory Visit: Payer: Self-pay | Admitting: Internal Medicine

## 2021-05-22 ENCOUNTER — Other Ambulatory Visit: Payer: Self-pay

## 2021-05-22 DIAGNOSIS — E875 Hyperkalemia: Secondary | ICD-10-CM

## 2021-05-24 ENCOUNTER — Other Ambulatory Visit (INDEPENDENT_AMBULATORY_CARE_PROVIDER_SITE_OTHER): Payer: Medicare Other

## 2021-05-24 DIAGNOSIS — E875 Hyperkalemia: Secondary | ICD-10-CM

## 2021-05-24 LAB — BASIC METABOLIC PANEL
BUN: 19 mg/dL (ref 6–23)
CO2: 28 mEq/L (ref 19–32)
Calcium: 9.6 mg/dL (ref 8.4–10.5)
Chloride: 104 mEq/L (ref 96–112)
Creatinine, Ser: 1.83 mg/dL — ABNORMAL HIGH (ref 0.40–1.50)
GFR: 33.11 mL/min — ABNORMAL LOW (ref >60.00)
Glucose, Bld: 128 mg/dL — ABNORMAL HIGH (ref 70–99)
Potassium: 3.8 mEq/L (ref 3.5–5.1)
Sodium: 140 mEq/L (ref 135–145)

## 2021-05-27 MED ORDER — LOKELMA 10 G PO PACK: 10.0000 g | PACK | Freq: Every day | ORAL | 3 refills | Status: DC

## 2021-05-27 NOTE — Addendum Note (Signed)
Addended byDamita Dunnings D on: 05/27/2021 08:23 AM   Modules accepted: Orders

## 2021-05-28 DIAGNOSIS — R69 Illness, unspecified: Secondary | ICD-10-CM | POA: Diagnosis not present

## 2021-05-28 DIAGNOSIS — C778 Secondary and unspecified malignant neoplasm of lymph nodes of multiple regions: Secondary | ICD-10-CM | POA: Diagnosis not present

## 2021-05-28 DIAGNOSIS — C7951 Secondary malignant neoplasm of bone: Secondary | ICD-10-CM | POA: Diagnosis not present

## 2021-05-28 DIAGNOSIS — N281 Cyst of kidney, acquired: Secondary | ICD-10-CM | POA: Diagnosis not present

## 2021-05-29 DIAGNOSIS — C7951 Secondary malignant neoplasm of bone: Secondary | ICD-10-CM | POA: Insufficient documentation

## 2021-05-29 DIAGNOSIS — C61 Malignant neoplasm of prostate: Secondary | ICD-10-CM | POA: Insufficient documentation

## 2021-06-04 ENCOUNTER — Other Ambulatory Visit: Payer: Self-pay | Admitting: Urology

## 2021-06-04 ENCOUNTER — Other Ambulatory Visit (HOSPITAL_COMMUNITY): Payer: Self-pay | Admitting: Urology

## 2021-06-04 DIAGNOSIS — C61 Malignant neoplasm of prostate: Secondary | ICD-10-CM

## 2021-06-06 ENCOUNTER — Telehealth: Payer: Medicare Other

## 2021-06-06 DIAGNOSIS — N179 Acute kidney failure, unspecified: Secondary | ICD-10-CM | POA: Diagnosis not present

## 2021-06-06 DIAGNOSIS — N2581 Secondary hyperparathyroidism of renal origin: Secondary | ICD-10-CM | POA: Diagnosis not present

## 2021-06-06 DIAGNOSIS — D631 Anemia in chronic kidney disease: Secondary | ICD-10-CM | POA: Diagnosis not present

## 2021-06-06 DIAGNOSIS — E875 Hyperkalemia: Secondary | ICD-10-CM | POA: Diagnosis not present

## 2021-06-06 DIAGNOSIS — N183 Chronic kidney disease, stage 3 unspecified: Secondary | ICD-10-CM | POA: Diagnosis not present

## 2021-06-06 DIAGNOSIS — N1330 Unspecified hydronephrosis: Secondary | ICD-10-CM | POA: Diagnosis not present

## 2021-06-06 LAB — BASIC METABOLIC PANEL
BUN: 25 — AB (ref 4–21)
CO2: 24 — AB (ref 13–22)
Chloride: 100 (ref 99–108)
Creatinine: 1.9 — AB (ref 0.6–1.3)
Glucose: 114
Potassium: 5.6 mEq/L — AB (ref 3.5–5.1)
Sodium: 139 (ref 137–147)

## 2021-06-06 LAB — COMPREHENSIVE METABOLIC PANEL
Albumin: 4.5 (ref 3.5–5.0)
Calcium: 10.6 (ref 8.7–10.7)
eGFR: 34

## 2021-06-06 LAB — CBC AND DIFFERENTIAL
HCT: 39 — AB (ref 41–53)
Hemoglobin: 12.5 — AB (ref 13.5–17.5)
Neutrophils Absolute: 4.4
Platelets: 292 10*3/uL (ref 150–400)
WBC: 6.9

## 2021-06-06 LAB — CBC: RBC: 4.65 (ref 3.87–5.11)

## 2021-06-18 ENCOUNTER — Encounter (HOSPITAL_COMMUNITY)
Admission: RE | Admit: 2021-06-18 | Discharge: 2021-06-18 | Disposition: A | Discharge status: Home / Self Care | Payer: Medicare Other | Source: Ambulatory Visit | Attending: Urology | Admitting: Urology

## 2021-06-18 ENCOUNTER — Other Ambulatory Visit: Payer: Self-pay

## 2021-06-18 DIAGNOSIS — C61 Malignant neoplasm of prostate: Secondary | ICD-10-CM | POA: Diagnosis not present

## 2021-06-18 MED ORDER — TECHNETIUM TC 99M MEDRONATE IV KIT
20.0000 | PACK | Freq: Once | INTRAVENOUS | Status: AC | PRN
  Administered 2021-06-18: 21.7 via INTRAVENOUS

## 2021-07-02 DIAGNOSIS — N130 Hydronephrosis with ureteropelvic junction obstruction: Secondary | ICD-10-CM | POA: Diagnosis not present

## 2021-07-02 DIAGNOSIS — C7919 Secondary malignant neoplasm of other urinary organs: Secondary | ICD-10-CM | POA: Diagnosis not present

## 2021-07-02 DIAGNOSIS — N281 Cyst of kidney, acquired: Secondary | ICD-10-CM | POA: Diagnosis not present

## 2021-07-02 DIAGNOSIS — C7951 Secondary malignant neoplasm of bone: Secondary | ICD-10-CM | POA: Diagnosis not present

## 2021-07-19 ENCOUNTER — Ambulatory Visit (INDEPENDENT_AMBULATORY_CARE_PROVIDER_SITE_OTHER): Payer: Medicare Other | Admitting: Pharmacist

## 2021-07-19 DIAGNOSIS — M810 Age-related osteoporosis without current pathological fracture: Secondary | ICD-10-CM

## 2021-07-19 DIAGNOSIS — E785 Hyperlipidemia, unspecified: Secondary | ICD-10-CM

## 2021-07-19 DIAGNOSIS — E1169 Type 2 diabetes mellitus with other specified complication: Secondary | ICD-10-CM

## 2021-07-19 DIAGNOSIS — N1832 Chronic kidney disease, stage 3b: Secondary | ICD-10-CM

## 2021-07-19 DIAGNOSIS — E039 Hypothyroidism, unspecified: Secondary | ICD-10-CM

## 2021-07-19 DIAGNOSIS — I2510 Atherosclerotic heart disease of native coronary artery without angina pectoris: Secondary | ICD-10-CM

## 2021-07-19 DIAGNOSIS — E875 Hyperkalemia: Secondary | ICD-10-CM

## 2021-07-19 MED ORDER — METFORMIN HCL 500 MG PO TABS: 500.0000 mg | ORAL_TABLET | Freq: Every day | ORAL | 1 refills | Status: DC

## 2021-07-19 MED ORDER — ATORVASTATIN CALCIUM 40 MG PO TABS: 40.0000 mg | ORAL_TABLET | Freq: Every day | ORAL | 1 refills | Status: DC

## 2021-07-19 MED ORDER — LOKELMA 10 G PO PACK: 10.0000 g | PACK | Freq: Every day | ORAL | 0 refills | Status: DC

## 2021-07-19 MED ORDER — EZETIMIBE 10 MG PO TABS: 10.0000 mg | ORAL_TABLET | Freq: Every day | ORAL | 1 refills | Status: DC

## 2021-07-19 NOTE — Chronic Care Management (AMB) (Signed)
? ? ?Chronic Care Management ?Pharmacy Note ? ?07/19/2021 ?Name:  Marco [name] MRN:  [id number] DOB:  [birth year] ? ?Summary: ?eGFR low but stabilizing. Patient has seen nephrologist. He has started Xtandi 168m daily for metastatic prostate cancer. Seeing Dr GAbner Greenspanwith [facility].  ?Home blood glucose was been in range. Patient reports he only takes metformin twice daily about 3 or 4 days per week, other days he is only taking once a day. He also take Januvia 524mdaily for diabetes. His last A1c was 6.9 (05/14/2021)  ?Patient asks to get any medications possible thru mail order instead of [facility] ? ?Recommendations/Changes made from today's visit: ?Continue to hold alendronate until further instructions due to CrCl <35 mL/min ?Will send in mail order Rx to [facility] at patient request for atorvastatin and exetimibe. ?Checked with Dr PaLarose Kellsbout sending [place] and also about possibly lowering dose of metformin to 50052maily due to change in renal function and patient is mostly taking only once daily. Lower dose metformin approved and Lokelma OK for 90 days - Rx sent to mail order. Recommend A1c goal < 7.5% due to other chronic conditions - Ok by Dr PazLarose Kells ? ?Subjective: ?Marco Z PatMolyneux an 88 104o. year old male who is a primary patient of Paz, JosAlda BertholdD.  The CCM team was consulted for assistance with disease management and care coordination needs.   ? ?Engaged with patient by telephone for follow up visit in response to provider referral for pharmacy case management and/or care coordination services.  ? ?Consent to Services:  ?The patient was given information about Chronic Care Management services, agreed to services, and gave verbal consent prior to initiation of services.  Please see initial visit note for detailed documentation.  ? ?Patient Care Team: ?PazColon BranchD as PCP - General (Internal Medicine) ?TepRoel CluckD as Referring Physician (Ophthalmology) ?EckCherre RobinsRPH-CPP (Pharmacist) ?GayJanith LimaD as Consulting Physician (Urology) ?SinGean QuintD as Consulting Physician (Nephrology) ? ?Recent office visits: ?05/14/2021- Int Med (Dr PazLarose Kellsollow up. Potassium noted to be very elevated. Restarted Lokelma 10grams daily. ?[facility] resp[facility] infection. Prescribed albuterol inhaler, amoxicillin -clavulanate 875 bid for 5 days and ipratropium nasal spray. ?02/11/2021 - PCP (Dr PazLarose Kellseen for follow up chronic conditions. Labs drawn and noted to have decreased in eGFR and hyperkalemia. A1c and TSH also increased.  Prescribed Lokelma for hyperkalemia. Levothyroxine increased from 29m41mo 100mc39mily. Referred to nephrology. ?  ?Recent consult visits:  ?07/02/2021 - urology (Dr Gay) Abner Greenspan prostate cancer. Given 45mg 36mard today. Considering adding antiandrogen therapy. Recommended weight bearing exercise, calcium and vitamin D supplement. Considering Xgeva if renal function allows.  ?05/29/2021 - Urology (Dr Gay) nAbner Greenspany diagnoses hormone naive metastatic prostate cancer. Given Firmagon 240mg 167m2023. Discussed therapy. Patient will come in for Eligard in 1 months. Patinet declined referral to oncology. Ordered bone scan and discussed Xgeva. ?05/21/2021 - Urology (Dr Gay) F/Abner Greenspanight hydronephrosis and left renal cyst with elevated PSA. TRUS biopsy performed for presumed metastatic prostate cancer. Elected continue surveillance.  ?04/09/2022 - Urology (Dr Gay) SeAbner Greenspanfor hydronephrosis with ureteropelvic junction obstruction.  ?[facility]20[facility] Decreased Creatinine. Noted not available at this time but some labs have been entered from visit with Dr Singh. Candiss Norse1/2022 - Ophthamology (Dr [facility]epedinRex Krasum [facility] No retinopathy noted in either eye. Recommended OTC  artificial tears for dry eye syndrome ? ? ?Hospital visits: ?None in previous 6 months ? ?Objective: ? ?Lab Results  ?Component  Value Date  ? CREATININE 1.83 (H) 05/24/2021  ? CREATININE 1.84 (H) 05/14/2021  ? CREATININE 1.7 (A) 03/05/2021  ? ? ?Lab Results  ?Component Value Date  ? HGBA1C 6.9 (H) 05/14/2021  ? ?Last diabetic Eye exam:  ?Lab Results  ?Component Value Date/Time  ? HMDIABEYEEXA No Retinopathy 03/04/2021 12:00 AM  ?  ?Last diabetic Foot exam:  ?Lab Results  ?Component Value Date/Time  ? HMDIABFOOTEX Normal/c/o feet pain 03/08/2015 12:00 AM  ?  ? ?   ?Component Value Date/Time  ? CHOL 126 09/06/2020 0935  ? TRIG 88.0 09/06/2020 0935  ? HDL 52.60 09/06/2020 0935  ? CHOLHDL 2 09/06/2020 0935  ? VLDL 17.6 09/06/2020 0935  ? [place] 56 09/06/2020 0935  ? ? ?Hepatic Function Latest Ref Rng & Units 05/14/2021 03/05/2021 09/06/2020  ?Total Protein 6.0 - 8.3 g/dL 7.5 - 7.1  ?Albumin 3.5 - 5.2 g/dL 4.3 4.5 4.2  ?AST 0 - 37 U/L 29 - 23  ?ALT 0 - 53 U/L 22 - 15  ?Alk Phosphatase 39 - 117 U/L 113 - 86  ?Total Bilirubin 0.2 - 1.2 mg/dL 0.6 - 0.7  ? ? ?Lab Results  ?Component Value Date/Time  ? TSH 4.62 05/14/2021 09:23 AM  ? TSH 1.15 04/03/2021 09:27 AM  ? FREET4 0.72 10/01/2017 03:59 PM  ? ? ?CBC Latest Ref Rng & Units 05/14/2021 03/05/2021 02/11/2021  ?WBC 4.0 - 10.5 K/uL 6.2 5.8 5.3  ?Hemoglobin 13.0 - 17.0 g/dL 11.9(L) 11.2(A) 11.4(L)  ?Hematocrit 39.0 - 52.0 % 37.8(L) 34(A) 35.6(L)  ?Platelets 150.0 - 400.0 K/uL 223.0 194 165.0  ? ? ?No results found for: VD25OH ? ?Clinical ASCVD: Yes  ?The ASCVD Risk score (Arnett DK, et al., 2019) failed to calculate for the following reasons: ?  The 2019 ASCVD risk score is only valid for ages 67 to 11   ? ?Other:  ?DEXA 11/17/2019 ?Lowest T-Score at right femoral neck = -1.5 (Compared to 09/07/2013 score was -1.9) ?Patient classified as osteoporosis due to h/o of hip fracture.  ? ?Social History  ? ?Tobacco Use  ?Smoking Status Never  ?Smokeless Tobacco Never  ? ?BP Readings from Last 3 Encounters:  ?05/14/21 122/74  ?05/03/21 (!) 146/73  ?02/11/21 132/80  ? ?Pulse Readings from Last 3 Encounters:   ?05/14/21 68  ?05/03/21 72  ?02/11/21 67  ? ?Wt Readings from Last 3 Encounters:  ?05/14/21 143 lb 4 oz (65 kg)  ?02/11/21 146 lb 6 oz (66.4 kg)  ?09/06/20 138 lb 6 oz (62.8 kg)  ? ? ?Assessment: Review of patient past medical history, allergies, medications, health status, including review of consultants reports, laboratory and other test data, was performed as part of comprehensive evaluation and provision of chronic care management services.  ? ?SDOH:  (Social Determinants of Health) assessments and interventions performed:  ? ? ? ? ?[place] ? ?No Known Allergies ? ?Medications Reviewed Today   ? ? Reviewed by [name], RPH-CPP (Pharmacist) on 07/19/21 at 1206  Med List Status: <None>  ? ?Medication Order Taking? Sig Documenting Provider Last Dose Status Informant  ?ACCU-CHEK GUIDE test strip [id number] Yes CHECK BLOOD SUGAR ONCE  DAILY. Colon Branch, MD Taking Active   ?Accu-Chek Softclix Lancets lancets [id number] Yes CHECK BLOOD SUGARS ONCE  DAILY AS DIRECTED Colon Branch, MD Taking Active   ?Alcohol Swabs PADS [id number] Yes  Use to clean finger prior to testing blood sugars Colon Branch, MD Taking Active   ?alendronate (FOSAMAX) 70 MG tablet [id number] No TAKE 1 TABLET(70 MG) BY MOUTH 1 TIME A WEEK WITH A FULL GLASS OF WATER AND ON AN EMPTY STOMACH  ?Patient not taking: Reported on 05/14/2021  ? Colon Branch, MD Not Taking Active   ?         ?Med Note Antony Contras, Jeanna Giuffre B   Fri Jul 19, 2021 12:02 PM) On hold due to low eGFR / creatinine clearance.   ?aspirin EC 81 MG EC tablet [id number] Yes Take 1 tablet (81 mg total) by mouth daily. Murlean Iba, MD Taking Active   ?atorvastatin (LIPITOR) 40 MG tablet [id number] Yes Take 1 tablet (40 mg total) by mouth at bedtime. Colon Branch, MD Taking Active   ?Blood Glucose Calibration (ACCU-CHEK GUIDE CONTROL) Yehuda Budd [id number] Yes Use as directed Colon Branch, MD Taking Active   ?Blood Glucose Monitoring Suppl (ACCU-CHEK GUIDE) w/Device KIT [id number] Yes Check blood  sugars once daily Colon Branch, MD Taking Active   ?Calcium Carb-Cholecalciferol (CALCIUM 500 + D) 500-125 MG-UNIT TABS [id number] Yes Take 1 tablet by mouth daily. [provider] Taking Active   ?ezet

## 2021-07-19 NOTE — Patient Instructions (Signed)
Mr. Crace ?It was a pleasure speaking with you today.  ?I have attached a summary of our visit today and information about your health goals.  ? ?Patient Goals/Self-Care Activities ?Over the next 90 days, patient will:  ?take medications as prescribed,  ?check glucose daily , document, and provide at future appointments,  ?stop alendronate ? ? ?If you have any questions or concerns, please feel free to contact me either at the phone number below or with a MyChart message.  ? ?Keep up the good work! ? ?[name], PharmD ?Clinical Pharmacist ?[facility] ?[facility] ?[phone] (direct line)  ?[phone] (main office number) ? ? ? ?Chronic Care Management Care Plan  ?Hyperlipidemia ?Lab Results  ?Component Value Date/Time  ? [place] 56 09/06/2020 09:35 AM  ? ?Pharmacist Clinical Goal(s): ?Over the next 90 days, patient will work with PharmD and providers to maintain LDL goal < 70 ?Current regimen:  ?Atorvastatin '40mg'$  daily  ?Ezetimibe '10mg'$  daily ?Interventions: ?Emphasized the importance of taking both cholesterol medications daily ?Discussed that each of his cholesterol medications work differently but together to help lower LDL cholesterol and prevent heart disease ?Patient self care activities - Over the next 90 days, patient will: ?Continue to take atorvastatin '40mg'$  daily and ezetimibe '10mg'$  daily ? ?Diabetes ?Lab Results  ?Component Value Date/Time  ? HGBA1C 6.9 (H) 05/14/2021 09:23 AM  ? HGBA1C 7.1 (H) 02/11/2021 09:47 AM  ? ?Pharmacist Clinical Goal(s): ?Over the next 90 days, patient will work with PharmD and providers to maintain A1c goal < 7.5% ?Current regimen:  ?Metformin '500mg'$  - take 1 tablets twice daily  ?Januvia '50mg'$  daily ?Interventions: ?Emphasized the importance of med adherence ?Discussed A1c goals ?Updated prescription at [facility] for Januvia '50mg'$  strength (patient will no longer have to halve '100mg'$  tablets) ?Patient self care activities - Over the next 90 days,  patient will: ?Check blood sugar once daily, document, and provide at future appointments; Call office if you have readings less than 80 or over 200. ?Contact provider with any episodes of hypoglycemia ?Remember to have yearly eye exam ?Home blood glucose goals  ?Fasting blood glucose goal (before meals) = 80 to 130 ?Blood glucose goal after a meal = less than 180  ? ? ?Hypothyroidism ?Pharmacist Clinical Goal(s) ?Over the next 90 days, patient will work with PharmD and providers to maintain TSH within normal limits ?Current regimen:  ?Levothyroxine 171mg daily ?Interventions: ?Emphasized the importance of med adherence ?Patient self care activities - Over the next 90 days, patient will: ?Start new dose of levothyroxine 1044m daily ? ?Osteoporosis ?Pharmacist Clinical Goal(s) ?Over the next 90 days, patient will work with PharmD and providers to reduce risk of fracture due to osteoporosis  ?Current regimen:  ?Alendronate '70mg'$  (continue to not take this medication for now)  ?Calcium '500mg'$  daily/Vitamin D 5000 units daily  ?Interventions: ?Consider intake of '1200mg'$  of calcium daily through diet and/or supplementation ?Consider intake of [phone] units of vitamin D through supplementation  ?Discussed fall prevention ?Patient self care activities - Over the next 90 days, patient will: ?Ensure daily intake of at least '1200mg'$  of calcium daily through diet and/or supplementation ?Ensure daily intake of at least 1000 units of vitamin D through supplementation  ?Do not take alendronate until further instruction ? ?Medication management ?Pharmacist Clinical Goal(s): ?Over the next 90 days, patient will work with PharmD and providers to achieve optimal medication adherence ?[facility] ?Interventions ?Comprehensive medication review performed. ?Continue current medication management strategy ?Patient self  care activities - Over the next 90 days, patient will: ?Focus on medication adherence by filling  and taking medications appropriately  ?Take medications as prescribed ?Report any questions or concerns to PharmD and/or provider(s) ? ?Patient verbalizes understanding of instructions and care plan provided today and agrees to view in [place]. Active MyChart status confirmed with patient.    ?

## 2021-08-02 DIAGNOSIS — M810 Age-related osteoporosis without current pathological fracture: Secondary | ICD-10-CM

## 2021-08-02 DIAGNOSIS — I2510 Atherosclerotic heart disease of native coronary artery without angina pectoris: Secondary | ICD-10-CM | POA: Diagnosis not present

## 2021-08-02 DIAGNOSIS — E039 Hypothyroidism, unspecified: Secondary | ICD-10-CM

## 2021-08-02 DIAGNOSIS — E785 Hyperlipidemia, unspecified: Secondary | ICD-10-CM | POA: Diagnosis not present

## 2021-08-02 DIAGNOSIS — E1169 Type 2 diabetes mellitus with other specified complication: Secondary | ICD-10-CM | POA: Diagnosis not present

## 2021-08-13 ENCOUNTER — Ambulatory Visit (INDEPENDENT_AMBULATORY_CARE_PROVIDER_SITE_OTHER): Payer: Medicare Other | Admitting: Internal Medicine

## 2021-08-13 ENCOUNTER — Encounter: Payer: Self-pay | Admitting: Internal Medicine

## 2021-08-13 VITALS — BP 122/68 | HR 71 | Temp 97.9°F | Resp 18 | Ht 69.0 in | Wt 143.2 lb

## 2021-08-13 DIAGNOSIS — N1832 Chronic kidney disease, stage 3b: Secondary | ICD-10-CM

## 2021-08-13 DIAGNOSIS — C7951 Secondary malignant neoplasm of bone: Secondary | ICD-10-CM | POA: Diagnosis not present

## 2021-08-13 DIAGNOSIS — E785 Hyperlipidemia, unspecified: Secondary | ICD-10-CM

## 2021-08-13 DIAGNOSIS — C61 Malignant neoplasm of prostate: Secondary | ICD-10-CM

## 2021-08-13 DIAGNOSIS — E1169 Type 2 diabetes mellitus with other specified complication: Secondary | ICD-10-CM

## 2021-08-13 DIAGNOSIS — E039 Hypothyroidism, unspecified: Secondary | ICD-10-CM | POA: Diagnosis not present

## 2021-08-13 LAB — BASIC METABOLIC PANEL
BUN: 27 mg/dL — ABNORMAL HIGH (ref 6–23)
CO2: 25 mEq/L (ref 19–32)
Calcium: 9.9 mg/dL (ref 8.4–10.5)
Chloride: 103 mEq/L (ref 96–112)
Creatinine, Ser: 1.65 mg/dL — ABNORMAL HIGH (ref 0.40–1.50)
GFR: 37.43 mL/min — ABNORMAL LOW (ref >60.00)
Glucose, Bld: 137 mg/dL — ABNORMAL HIGH (ref 70–99)
Potassium: 5.3 mEq/L — ABNORMAL HIGH (ref 3.5–5.1)
Sodium: 137 mEq/L (ref 135–145)

## 2021-08-13 LAB — LIPID PANEL
Cholesterol: 132 mg/dL (ref 0–200)
HDL: 61 mg/dL (ref >39.00)
LDL Cholesterol: 53 mg/dL (ref 0–99)
NonHDL: 71
Total CHOL/HDL Ratio: 2
Triglycerides: 91 mg/dL (ref 0.0–149.0)
VLDL: 18.2 mg/dL (ref 0.0–40.0)

## 2021-08-13 LAB — TSH: TSH: 5.46 u[IU]/mL (ref 0.35–5.50)

## 2021-08-13 LAB — HEMOGLOBIN A1C: Hgb A1c MFr Bld: 7.7 % — ABNORMAL HIGH (ref 4.6–6.5)

## 2021-08-13 NOTE — Assessment & Plan Note (Signed)
DM: Currently on metformin 5oo mg only due to CKD (although his GFR has recuperated). ?Dyslipidemia: On atorvastatin and Zetia, checking labs ?Hypothyroidism: On levothyroxine, check TSH. ?CAD: Controlling CBR F, no symptoms ?CKD: Last creatinine was 1.3 on 07/02/2021 (K PN), GFR calculated at 54.  Checking a BMP.  I am not sure if he is saw nephrology in the last few months, will request records. ?Hyperkalemia: On Lokelma, checking labs today, ?Osteoporosis: Were holding Fosamax due to CKD noting that his GFR has recuperated. ?Urology: ?Since the last visit saw urology several times, they had left the following issues: ?- Metastatic prostate cancer, with metastasis.  + Bone scan over the thoracic and lumbar spine.  (Denies pain) ?- R hydronephrosis, asymptomatic, creatinine at the time is 1.6.  No intervention. ?- Simple bilateral renal cysts.  Bosniak 49F. ?RTC 4 months ?

## 2021-08-13 NOTE — Patient Instructions (Addendum)
Continue holding Fosamax ? ?Other medications the same ? ?Check the  blood pressure regularly ?BP GOAL is between 110/65 and  135/85. ?If it is consistently higher or lower, let me know ? ?  ? ?GO TO THE LAB : Get the blood work   ? ? ?[place], [place] ?Come back for a checkup in 4 months    ?

## 2021-08-13 NOTE — Progress Notes (Signed)
? ?Subjective:  ? ? Patient ID: Marco [name], male    DOB: [birth year], 88 y.o.   MRN: [id number] ? ?DOS:  08/13/2021 ?Type of visit - description: f/u ? ?Since the last office visit he feels well. ?Has seen urology, note reviewed and summarized in the assessment and plan. ?I  also talked to our clinical pharmacists and we adjusted his medication. ? ? ?Review of Systems ? Denies chest pain or difficulty breathing ?No lower extremity edema ?Denies major problems with MSK pain ? ?Past Medical History:  ?Diagnosis Date  ? Amaurosis fugax 2017  ? Diabetes mellitus   ? DJD (degenerative joint disease) 08/25/2013  ? Elevated PSA   ? Other and unspecified hyperlipidemia 04/25/2013  ? Prostate cancer ([place])   ? ? ?Past Surgical History:  ?Procedure Laterality Date  ? CATARACT EXTRACTION    ? Bilaterally  ? TYMPANIC MEMBRANE REPAIR    ? R  ? ? ?Current Outpatient Medications  ?Medication Instructions  ? ACCU-CHEK GUIDE test strip CHECK BLOOD SUGAR ONCE  DAILY.  ? Accu-Chek Softclix Lancets lancets CHECK BLOOD SUGARS ONCE  DAILY AS DIRECTED  ? Alcohol Swabs PADS Use to clean finger prior to testing blood sugars  ? alendronate (FOSAMAX) 70 MG tablet TAKE 1 TABLET(70 MG) BY MOUTH 1 TIME A WEEK WITH A FULL GLASS OF WATER AND ON AN EMPTY STOMACH  ? aspirin 81 mg, Oral, Daily  ? atorvastatin (LIPITOR) 40 mg, Oral, Daily at bedtime  ? Blood Glucose Calibration (ACCU-CHEK GUIDE CONTROL) LIQD Use as directed  ? Blood Glucose Monitoring Suppl (ACCU-CHEK GUIDE) w/Device KIT Check blood sugars once daily  ? Calcium Carb-Cholecalciferol (CALCIUM 500 + D) 500-125 MG-UNIT TABS 1 tablet, Oral, Daily  ? ezetimibe (ZETIA) 10 mg, Oral, Daily  ? ipratropium (ATROVENT) 0.06 % nasal spray 2 sprays, Each Nare, 4 times daily, As needed for nasal congestion, runny nose  ? levothyroxine (SYNTHROID) 100 mcg, Oral, Daily before breakfast  ? Lokelma 10 g, Oral, Daily, Or as directed by physician  ? metFORMIN (GLUCOPHAGE) 500 mg, Oral, Daily, *take with  a meal*  ? Multiple Vitamin (MULTI-VITAMIN DAILY PO) 1 tablet, Oral, Daily  ? nitroGLYCERIN (NITROSTAT) 0.4 mg, Sublingual, Every 5 min x3 PRN  ? sitaGLIPtin (JANUVIA) 50 mg, Oral, Daily  ? vitamin B-12 (CYANOCOBALAMIN) 1,000 mcg, Oral, Daily  ? Xtandi 120 mg, Oral, Daily  ? ? ?   ?Objective:  ? Physical Exam ?BP 122/68 (BP Location: Left Arm, Patient Position: Sitting, Cuff Size: Small)   Pulse 71   Temp 97.9 ?F (36.6 ?C) (Oral)   Resp 18   Ht 5' 9" (1.753 m)   Wt 143 lb 4 oz (65 kg)   SpO2 96%   BMI 21.15 kg/m?  ?General:   ?Well developed, frail, slt underweight appearing. ?HEENT:  ?Normocephalic . Face symmetric, atraumatic ?Lungs:  ?CTA B ?Normal respiratory effort, no intercostal retractions, no accessory muscle use. ?Heart: RRR,  no murmur.  ?Lower extremities: no pretibial edema bilaterally  ?Skin: Not pale. Not jaundice ?Neurologic:  ?alert & oriented X3.  ?Speech normal, gait  assisted by a cane ?Psych--  ?Cognition and judgment appear intact.  ?Cooperative with normal attention span and concentration.  ?Behavior appropriate. ?No anxious or depressed appearing.  ? ?   ?Assessment   ? ? 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 ?Osteoporosis: T score -1.5, hip fracture ?Osteopenia : DEXA 2015, Rx calcium and vitamin D.  DEXA 11-2019: Rx Fosamax ?ED ?Elevated PSA-prostate nodule:saw urology multiple times, last ~ 2013, declined a bx, declined further eval, aware of risks ?HOH ? ?PLAN: ?DM: Currently on metformin 5oo mg only due to CKD (although his GFR has recuperated). ?Dyslipidemia: On atorvastatin and Zetia, checking labs ?Hypothyroidism: On levothyroxine, check TSH. ?CAD: Controlling CBR F, no  symptoms ?CKD: Last creatinine was 1.3 on 07/02/2021 (K PN), GFR calculated at 54.  Checking a BMP.  I am not sure if he is saw nephrology in the last few months, will request records. ?Hyperkalemia: On Lokelma, checking labs today, ?Osteoporosis: Were holding Fosamax due to CKD noting that his GFR has recuperated. ?Urology: ?Since the last visit saw urology several times, they had left the following issues: ?- Metastatic prostate cancer, with metastasis.  + Bone scan over the thoracic and lumbar spine.  (Denies pain) ?- R hydronephrosis, asymptomatic, creatinine at the time is 1.6.  No intervention. ?- Simple bilateral renal cysts.  Bosniak 49F. ?RTC 4 months ? ? ? ?This visit occurred during the SARS-CoV-2 public health emergency.  Safety protocols were in place, including screening questions prior to the visit, additional usage of staff PPE, and extensive cleaning of exam room while observing appropriate contact time as indicated for disinfecting solutions.  ? ?

## 2021-08-15 ENCOUNTER — Encounter: Payer: Self-pay | Admitting: Internal Medicine

## 2021-08-15 LAB — PTH, INTACT: PTH, Intact: 23

## 2021-08-29 ENCOUNTER — Telehealth: Payer: Self-pay

## 2021-08-30 NOTE — Telephone Encounter (Signed)
Appt was made

## 2021-09-04 ENCOUNTER — Ambulatory Visit (INDEPENDENT_AMBULATORY_CARE_PROVIDER_SITE_OTHER): Payer: Medicare Other

## 2021-09-04 ENCOUNTER — Telehealth: Payer: Self-pay

## 2021-09-04 DIAGNOSIS — Z0000 Encounter for general adult medical examination without abnormal findings: Secondary | ICD-10-CM

## 2021-09-04 NOTE — Patient Instructions (Signed)
Marco [name] , ?Thank you for taking time to come for your Medicare Wellness Visit. I appreciate your ongoing commitment to your health goals. Please review the following plan we discussed and let me know if I can assist you in the future.  ? ?Screening recommendations/referrals: ?Colonoscopy: no longer needed ?Recommended yearly ophthalmology/optometry visit for glaucoma screening and checkup ?Recommended yearly dental visit for hygiene and checkup ? ?Vaccinations: ?Influenza vaccine: declined ?Pneumococcal vaccine: declined ?Tdap vaccine: declined ?Shingles vaccine: declined   ?Covid-19: declined ? ?Advanced directives: no ? ?Conditions/risks identified: see problem list ? ?Next appointment: Follow up in one year for your annual wellness visit. 09/06/22 ? ?Preventive Care 44 Years and Older, Male ?Preventive care refers to lifestyle choices and visits with your health care provider that can promote health and wellness. ?What does preventive care include? ?A yearly physical exam. This is also called an annual well check. ?Dental exams once or twice a year. ?Routine eye exams. Ask your health care provider how often you should have your eyes checked. ?Personal lifestyle choices, including: ?Daily care of your teeth and gums. ?Regular physical activity. ?Eating a healthy diet. ?Avoiding tobacco and drug use. ?Limiting alcohol use. ?Practicing safe sex. ?Taking low doses of aspirin every day. ?Taking vitamin and mineral supplements as recommended by your health care provider. ?What happens during an annual well check? ?The services and screenings done by your health care provider during your annual well check will depend on your age, overall health, lifestyle risk factors, and family history of disease. ?Counseling  ?Your health care provider may ask you questions about your: ?Alcohol use. ?Tobacco use. ?Drug use. ?Emotional well-being. ?Home and relationship well-being. ?Sexual activity. ?Eating habits. ?History of  falls. ?Memory and ability to understand (cognition). ?Work and work Statistician. ?Screening  ?You may have the following tests or measurements: ?Height, weight, and BMI. ?Blood pressure. ?Lipid and cholesterol levels. These may be checked every 5 years, or more frequently if you are over 48 years old. ?Skin check. ?Lung cancer screening. You may have this screening every year starting at age 70 if you have a 30-pack-year history of smoking and currently smoke or have quit within the past 15 years. ?Fecal occult blood test (FOBT) of the stool. You may have this test every year starting at age 52. ?Flexible sigmoidoscopy or colonoscopy. You may have a sigmoidoscopy every 5 years or a colonoscopy every 10 years starting at age 14. ?Prostate cancer screening. Recommendations will vary depending on your family history and other risks. ?Hepatitis C blood test. ?Hepatitis B blood test. ?Sexually transmitted disease (STD) testing. ?Diabetes screening. This is done by checking your blood sugar (glucose) after you have not eaten for a while (fasting). You may have this done every 1-3 years. ?Abdominal aortic aneurysm (AAA) screening. You may need this if you are a current or former smoker. ?Osteoporosis. You may be screened starting at age 24 if you are at high risk. ?Talk with your health care provider about your test results, treatment options, and if necessary, the need for more tests. ?Vaccines  ?Your health care provider may recommend certain vaccines, such as: ?Influenza vaccine. This is recommended every year. ?Tetanus, diphtheria, and acellular pertussis (Tdap, Td) vaccine. You may need a Td booster every 10 years. ?Zoster vaccine. You may need this after age 18. ?Pneumococcal 13-valent conjugate (PCV13) vaccine. One dose is recommended after age 41. ?Pneumococcal polysaccharide (PPSV23) vaccine. One dose is recommended after age 32. ?Talk to your health care provider about which  screenings and vaccines you need and  how often you need them. ?This information is not intended to replace advice given to you by your health care provider. Make sure you discuss any questions you have with your health care provider. ?Document Released: 05/18/2015 Document Revised: 01/09/2016 Document Reviewed: 02/20/2015 ?Elsevier Interactive Patient Education ? 2017 [place]. ? ?Fall Prevention in the Home ?Falls can cause injuries. They can happen to people of all ages. There are many things you can do to make your home safe and to help prevent falls. ?What can I do on the outside of my home? ?Regularly fix the edges of walkways and driveways and fix any cracks. ?Remove anything that might make you trip as you walk through a door, such as a raised step or threshold. ?Trim any bushes or trees on the path to your home. ?Use bright outdoor lighting. ?Clear any walking paths of anything that might make someone trip, such as rocks or tools. ?Regularly check to see if handrails are loose or broken. Make sure that both sides of any steps have handrails. ?Any raised decks and porches should have guardrails on the edges. ?Have any leaves, snow, or ice cleared regularly. ?Use sand or salt on walking paths during winter. ?Clean up any spills in your garage right away. This includes oil or grease spills. ?What can I do in the bathroom? ?Use night lights. ?Install grab bars by the toilet and in the tub and shower. Do not use towel bars as grab bars. ?Use non-skid mats or decals in the tub or shower. ?If you need to sit down in the shower, use a plastic, non-slip stool. ?Keep the floor dry. Clean up any water that spills on the floor as soon as it happens. ?Remove soap buildup in the tub or shower regularly. ?Attach bath mats securely with double-sided non-slip rug tape. ?Do not have throw rugs and other things on the floor that can make you trip. ?What can I do in the bedroom? ?Use night lights. ?Make sure that you have a light by your bed that is easy to  reach. ?Do not use any sheets or blankets that are too big for your bed. They should not hang down onto the floor. ?Have a firm chair that has side arms. You can use this for support while you get dressed. ?Do not have throw rugs and other things on the floor that can make you trip. ?What can I do in the kitchen? ?Clean up any spills right away. ?Avoid walking on wet floors. ?Keep items that you use a lot in easy-to-reach places. ?If you need to reach something above you, use a strong step stool that has a grab bar. ?Keep electrical cords out of the way. ?Do not use floor polish or wax that makes floors slippery. If you must use wax, use non-skid floor wax. ?Do not have throw rugs and other things on the floor that can make you trip. ?What can I do with my stairs? ?Do not leave any items on the stairs. ?Make sure that there are handrails on both sides of the stairs and use them. Fix handrails that are broken or loose. Make sure that handrails are as long as the stairways. ?Check any carpeting to make sure that it is firmly attached to the stairs. Fix any carpet that is loose or worn. ?Avoid having throw rugs at the top or bottom of the stairs. If you do have throw rugs, attach them to the floor with carpet  tape. ?Make sure that you have a light switch at the top of the stairs and the bottom of the stairs. If you do not have them, ask someone to add them for you. ?What else can I do to help prevent falls? ?Wear shoes that: ?Do not have high heels. ?Have rubber bottoms. ?Are comfortable and fit you well. ?Are closed at the toe. Do not wear sandals. ?If you use a stepladder: ?Make sure that it is fully opened. Do not climb a closed stepladder. ?Make sure that both sides of the stepladder are locked into place. ?Ask someone to hold it for you, if possible. ?Clearly mark and make sure that you can see: ?Any grab bars or handrails. ?First and last steps. ?Where the edge of each step is. ?Use tools that help you move  around (mobility aids) if they are needed. These include: ?Canes. ?Walkers. ?Scooters. ?Crutches. ?Turn on the lights when you go into a dark area. Replace any light bulbs as soon as they burn out. ?Set up your fu

## 2021-09-04 NOTE — Progress Notes (Addendum)
? ?Subjective:  ? Marco [name] is a 88 y.o. male who presents for Medicare Annual/Subsequent preventive examination. ? ?I connected with  Marco [name] on 09/04/21 by a audio enabled telemedicine application and verified that I am speaking with the correct person using two identifiers. ? ?Patient Location: Home ? ?Provider Location: Office/Clinic ? ?I discussed the limitations of evaluation and management by telemedicine. The patient expressed understanding and agreed to proceed.  ? ?Review of Systems    ? ?Cardiac Risk Factors include: advanced age (>45mn, >>28women);diabetes mellitus;dyslipidemia ? ?   ?Objective:  ?  ?There were no vitals filed for this visit. ?There is no height or weight on file to calculate BMI. ? ? ?  09/04/2021  ?  1:10 PM 06/03/2019  ?  1:09 PM 11/19/2016  ? 11:00 PM 11/19/2016  ?  7:12 PM 07/24/2016  ?  8:26 AM  ?Advanced Directives  ?Does Patient Have a Medical Advance Directive? No No No Yes No  ?Would patient like information on creating a medical advance directive? No - Patient declined No - Patient declined No - Patient declined  No - Patient declined  ? ? ?Current Medications (verified) ?Outpatient Encounter Medications as of 09/04/2021  ?Medication Sig  ? ACCU-CHEK GUIDE test strip CHECK BLOOD SUGAR ONCE  DAILY.  ? Accu-Chek Softclix Lancets lancets CHECK BLOOD SUGARS ONCE  DAILY AS DIRECTED  ? Alcohol Swabs PADS Use to clean finger prior to testing blood sugars  ? alendronate (FOSAMAX) 70 MG tablet TAKE 1 TABLET(70 MG) BY MOUTH 1 TIME A WEEK WITH A FULL GLASS OF WATER AND ON AN EMPTY STOMACH  ? aspirin EC 81 MG EC tablet Take 1 tablet (81 mg total) by mouth daily.  ? atorvastatin (LIPITOR) 40 MG tablet Take 1 tablet (40 mg total) by mouth at bedtime.  ? Blood Glucose Calibration (ACCU-CHEK GUIDE CONTROL) LIQD Use as directed  ? Blood Glucose Monitoring Suppl (ACCU-CHEK GUIDE) w/Device KIT Check blood sugars once daily  ? Calcium Carb-Cholecalciferol (CALCIUM 500 + D) 500-125  MG-UNIT TABS Take 1 tablet by mouth daily.  ? ezetimibe (ZETIA) 10 MG tablet Take 1 tablet (10 mg total) by mouth daily.  ? ipratropium (ATROVENT) 0.06 % nasal spray Place 2 sprays into both nostrils 4 (four) times daily. As needed for nasal congestion, runny nose  ? levothyroxine (SYNTHROID) 100 MCG tablet Take 1 tablet (100 mcg total) by mouth daily before breakfast.  ? metFORMIN (GLUCOPHAGE) 500 MG tablet Take 1 tablet (500 mg total) by mouth daily. *take with a meal*  ? Multiple Vitamin (MULTI-VITAMIN DAILY PO) Take 1 tablet by mouth daily.  ? nitroGLYCERIN (NITROSTAT) 0.4 MG SL tablet Place 1 tablet (0.4 mg total) under the tongue every 5 (five) minutes x 3 doses as needed for chest pain.  ? sitaGLIPtin (JANUVIA) 50 MG tablet Take 1 tablet (50 mg total) by mouth daily.  ? sodium zirconium cyclosilicate (LOKELMA) 10 g PACK packet Take 10 g by mouth daily. Or as directed by physician  ? vitamin B-12 (CYANOCOBALAMIN) 1000 MCG tablet Take 1,000 mcg by mouth daily.  ? XTANDI 40 MG tablet Take 120 mg by mouth daily.  ? ?No facility-administered encounter medications on file as of 09/04/2021.  ? ? ?Allergies (verified) ?Patient has no known allergies.  ? ?History: ?Past Medical History:  ?Diagnosis Date  ? Amaurosis fugax 2017  ? Diabetes mellitus   ? DJD (degenerative joint disease) 08/25/2013  ? Elevated PSA   ? Other and  unspecified hyperlipidemia 04/25/2013  ? Prostate cancer ([place])   ? ?Past Surgical History:  ?Procedure Laterality Date  ? CATARACT EXTRACTION    ? Bilaterally  ? TYMPANIC MEMBRANE REPAIR    ? R  ? ?Family History  ?Problem Relation Age of Onset  ? Other Mother   ?     Unknown   ? Other Father   ?     Unknown  ? Diabetes Neg Hx   ? CAD Neg Hx   ? Cancer Neg Hx   ? ?Social History  ? ?Socioeconomic History  ? Marital status: Legally Separated  ?  Spouse name: Not on file  ? Number of children: 3  ? Years of education: Not on file  ? Highest education level: Not on file  ?Occupational History  ?  Occupation: Retired  ?Tobacco Use  ? Smoking status: Never  ? Smokeless tobacco: Never  ?Substance and Sexual Activity  ? Alcohol use: No  ? Drug use: No  ? Sexual activity: Not on file  ?Other Topics Concern  ? Not on file  ?Social History Narrative  ? Moved to the [place] area 2012    ? Original from [place] , Married    ? Lives w/ wife, son and his family   ? 2 daughters, 1 son    ? ?Social Determinants of Health  ? ?Financial Resource Strain: Low Risk   ? Difficulty of Paying Living Expenses: Not very hard  ?Food Insecurity: Not on file  ?Transportation Needs: Not on file  ?Physical Activity: Inactive  ? Days of Exercise per Week: 0 days  ? Minutes of Exercise per Session: 0 min  ?Stress: Not on file  ?Social Connections: Not on file  ? ? ?Tobacco Counseling ?Counseling given: Not Answered ? ? ?Clinical Intake: ? ?Pre-visit preparation completed: Yes ? ?Pain : No/denies pain ? ?  ? ?Nutritional Risks: None ?Diabetes: Yes ?CBG done?: No ?Did pt. bring in CBG monitor from home?: No ? ?How often do you need to have someone help you when you read instructions, pamphlets, or other written materials from your doctor or pharmacy?: 1 - Never ? ?Diabetic?Yes ?Nutrition Risk Assessment: ? ?Has the patient had any N/V/D within the last 2 months?  No  ?Does the patient have any non-healing wounds?  No  ?Has the patient had any unintentional weight loss or weight gain?  No  ? ?Diabetes: ? ?Is the patient diabetic?  Yes  ?If diabetic, was a CBG obtained today?  No  ?Did the patient bring in their glucometer from home?  No  ?How often do you monitor your CBG's? Once daily.  ? ?Financial Strains and Diabetes Management: ? ?Are you having any financial strains with the device, your supplies or your medication? No .  ?Does the patient want to be seen by Chronic Care Management for management of their diabetes?  No  ?Would the patient like to be referred to a Nutritionist or for Diabetic Management?  No  ? ?Diabetic Exams: ? ?Diabetic  Eye Exam: Completed 03/04/21 ?Diabetic Foot Exam: Overdue, Pt has been advised about the importance in completing this exam. Pt is scheduled for diabetic foot exam on N/a.  ? ?Interpreter Needed?: No ? ?Information entered by :: Shaakira Chism ? ? ?Activities of Daily Living ? ?  09/04/2021  ?  1:13 PM 09/06/2020  ?  9:05 AM  ?In your present state of health, do you have any difficulty performing the following activities:  ?Hearing? 0  0  ?Vision? 0 0  ?Difficulty concentrating or making decisions? 0 0  ?Walking or climbing stairs? 0 0  ?Dressing or bathing? 0 0  ?Doing errands, shopping? 0 0  ?Preparing Food and eating ? N   ?Using the Toilet? N   ?In the past six months, have you accidently leaked urine? N   ?Do you have problems with loss of bowel control? N   ?Managing your Medications? N   ?Managing your Finances? N   ?Housekeeping or managing your Housekeeping? N   ? ? ?Patient Care Team: ?Colon Branch, MD as PCP - General (Internal Medicine) ?Roel Cluck, MD as Referring Physician (Ophthalmology) ?[name], RPH-CPP (Pharmacist) ?Janith Lima, MD as Consulting Physician (Urology) ?Gean Quint, MD as Consulting Physician (Nephrology) ? ?Indicate any recent Medical Services you may have received from other than [facility] providers in the past year (date may be approximate). ? ?   ?Assessment:  ? This is a routine wellness examination for Josip. ? ?Hearing/Vision screen ?No results found. ? ?Dietary issues and exercise activities discussed: ?Current Exercise Habits: The patient does not participate in regular exercise at present, Exercise limited by: None identified ? ? Goals Addressed   ?None ?  ? ?Depression Screen ? ?  09/04/2021  ?  1:11 PM 08/13/2021  ?  9:10 AM 05/14/2021  ?  8:49 AM 09/06/2020  ?  9:05 AM 06/03/2019  ?  1:12 PM 10/15/2018  ?  9:31 AM 10/01/2017  ?  8:27 AM  ?PHQ 2/9 Scores  ?PHQ - 2 Score 0 0 0 0 0 0 0  ?  ?Fall Risk ? ?  09/04/2021  ?  1:11 PM 08/13/2021  ?  9:10 AM 05/14/2021  ?  8:49 AM  09/06/2020  ?  9:05 AM 06/03/2019  ?  1:11 PM  ?Fall Risk   ?Falls in the past year? 1 0 0 0 0  ?Number falls in past yr: 0 0 0 0 0  ?Injury with Fall? 1 0 0 0 0  ?Risk for fall due to : History of fall(s)      ?[place]

## 2021-09-04 NOTE — Telephone Encounter (Signed)
Advise patient, he is taking an appropriate amount of Synthroid.  Recommend no change. ?If he is having severe headaches or new onset of headaches he needs to be seen. ?

## 2021-09-04 NOTE — Telephone Encounter (Signed)
Pt states that he is getting headaches from the levothyroxine pt would like to decrease the dosage.  ?

## 2021-09-09 DIAGNOSIS — N2581 Secondary hyperparathyroidism of renal origin: Secondary | ICD-10-CM | POA: Diagnosis not present

## 2021-09-09 DIAGNOSIS — N281 Cyst of kidney, acquired: Secondary | ICD-10-CM | POA: Diagnosis not present

## 2021-09-09 DIAGNOSIS — E875 Hyperkalemia: Secondary | ICD-10-CM | POA: Diagnosis not present

## 2021-09-09 DIAGNOSIS — D631 Anemia in chronic kidney disease: Secondary | ICD-10-CM | POA: Diagnosis not present

## 2021-09-09 DIAGNOSIS — N1330 Unspecified hydronephrosis: Secondary | ICD-10-CM | POA: Diagnosis not present

## 2021-09-09 DIAGNOSIS — N1832 Chronic kidney disease, stage 3b: Secondary | ICD-10-CM | POA: Diagnosis not present

## 2021-09-09 DIAGNOSIS — N183 Chronic kidney disease, stage 3 unspecified: Secondary | ICD-10-CM | POA: Diagnosis not present

## 2021-09-09 LAB — BASIC METABOLIC PANEL
BUN: 34 — AB (ref 4–21)
CO2: 27 — AB (ref 13–22)
Chloride: 104 (ref 99–108)
Creatinine: 1.7 — AB (ref 0.6–1.3)
Glucose: 156
Potassium: 5.7 mEq/L — AB (ref 3.5–5.1)
Sodium: 139 (ref 137–147)

## 2021-09-09 LAB — COMPREHENSIVE METABOLIC PANEL
Albumin: 4.4 (ref 3.5–5.0)
Calcium: 10.5 (ref 8.7–10.7)
eGFR: 40

## 2021-09-10 NOTE — Telephone Encounter (Signed)
Mailbox full

## 2021-09-10 NOTE — Telephone Encounter (Signed)
Spoke w/ Pt- informed of PCP recommendations. Offered to schedule an appt- Pt declined, states headaches are better.  ?

## 2021-09-19 ENCOUNTER — Encounter: Payer: Self-pay | Admitting: Internal Medicine

## 2021-09-19 ENCOUNTER — Other Ambulatory Visit: Payer: Self-pay | Admitting: Internal Medicine

## 2021-09-26 ENCOUNTER — Telehealth: Payer: Medicare Other

## 2021-09-26 ENCOUNTER — Other Ambulatory Visit: Payer: Self-pay | Admitting: Internal Medicine

## 2021-10-01 DIAGNOSIS — N130 Hydronephrosis with ureteropelvic junction obstruction: Secondary | ICD-10-CM | POA: Diagnosis not present

## 2021-10-01 DIAGNOSIS — C7951 Secondary malignant neoplasm of bone: Secondary | ICD-10-CM | POA: Diagnosis not present

## 2021-10-01 DIAGNOSIS — N281 Cyst of kidney, acquired: Secondary | ICD-10-CM | POA: Diagnosis not present

## 2021-10-24 ENCOUNTER — Telehealth: Payer: Self-pay | Admitting: *Deleted

## 2021-10-24 NOTE — Chronic Care Management (AMB) (Signed)
  Chronic Care Management Note  10/24/2021 Name: Marco [name] MRN: [id number] DOB: [birth year]  Marco [name] is a 88 y.o. year old male who is a primary care patient of Colon Branch, MD and is actively engaged with the care management team. I reached out to Dimas Millin by phone today to assist with re-scheduling a follow up visit with the Pharmacist  Follow up plan: Telephone appointment with care management team member scheduled for: 10/30/2021  Julian Hy, [place], [place] Management  Direct Dial: [phone]

## 2021-10-24 NOTE — Chronic Care Management (AMB) (Signed)
  Chronic Care Management Note  10/24/2021 Name: Marco [name] MRN: [id number] DOB: [birth year]  Marco [name] is a 88 y.o. year old male who is a primary care patient of Colon Branch, MD and is actively engaged with the care management team. I reached out to Reid by phone today to assist with re-scheduling a follow up visit with the Pharmacist  Follow up plan: Unsuccessful telephone outreach attempt made. A HIPAA compliant phone message was left for the patient providing contact information and requesting a return call.   Julian Hy, [place] Management  Direct Dial: [phone]

## 2021-10-28 ENCOUNTER — Other Ambulatory Visit: Payer: Self-pay | Admitting: Internal Medicine

## 2021-10-30 ENCOUNTER — Ambulatory Visit (INDEPENDENT_AMBULATORY_CARE_PROVIDER_SITE_OTHER): Payer: Medicare Other | Admitting: Pharmacist

## 2021-10-30 DIAGNOSIS — E039 Hypothyroidism, unspecified: Secondary | ICD-10-CM

## 2021-10-30 DIAGNOSIS — I2510 Atherosclerotic heart disease of native coronary artery without angina pectoris: Secondary | ICD-10-CM

## 2021-10-30 DIAGNOSIS — N1832 Chronic kidney disease, stage 3b: Secondary | ICD-10-CM

## 2021-10-30 DIAGNOSIS — E1169 Type 2 diabetes mellitus with other specified complication: Secondary | ICD-10-CM

## 2021-10-30 DIAGNOSIS — E785 Hyperlipidemia, unspecified: Secondary | ICD-10-CM

## 2021-10-30 MED ORDER — LEVOTHYROXINE SODIUM 100 MCG PO TABS: 100.0000 ug | ORAL_TABLET | Freq: Every day | ORAL | 1 refills | Status: DC

## 2021-10-30 MED ORDER — EZETIMIBE 10 MG PO TABS: 10.0000 mg | ORAL_TABLET | Freq: Every day | ORAL | 1 refills | Status: DC

## 2021-10-30 MED ORDER — METFORMIN HCL 500 MG PO TABS: 500.0000 mg | ORAL_TABLET | Freq: Every day | ORAL | 1 refills | Status: DC

## 2021-10-30 MED ORDER — SITAGLIPTIN PHOSPHATE 50 MG PO TABS: 50.0000 mg | ORAL_TABLET | Freq: Every day | ORAL | 1 refills | Status: DC

## 2021-10-30 MED ORDER — ATORVASTATIN CALCIUM 40 MG PO TABS: 40.0000 mg | ORAL_TABLET | Freq: Every day | ORAL | 1 refills | Status: DC

## 2021-10-30 NOTE — Patient Instructions (Signed)
Mr. Dec[place] It was a pleasure speaking with you  Below is a summary of your health goals over the next 3 months. You can also view your complete Chronic Care Management care plan in [place].   Patient Goals/Self-Care Activities take medications as prescribed,  check glucose daily - recommend checking at different times of day - sometimes in morning, evening, before meals and after meals. Document, and provide at future appointments Home blood glucose goals  Fasting blood glucose goal (before meals) = 80 to 130 Blood glucose goal after a meal = less than 180  Continue to hold alendronate We have updated prescriptions at your mail order pharmacy. You should receive a delivery in 3 to 5 days.     If you have any questions or concerns, please feel free to contact me either at the phone number below or with a MyChart message.   Keep up the good work!  [name], PharmD Clinical Pharmacist [place] [phone] (direct line)  [phone] (main office number)   Patient verbalizes understanding of instructions and care plan provided today and agrees to view in [place]. Active MyChart status and patient understanding of how to access instructions and care plan via MyChart confirmed with patient.

## 2021-10-30 NOTE — Chronic Care Management (AMB) (Signed)
Chronic Care Management Pharmacy Note  10/30/2021 Name:  ASLAN HIMES MRN:  [id number] DOB:  [birth year]  Summary: eGFR / renal function continues to improve. Estimate CrCl is still < 35 mL per min so recommend he continue to hold alendronate.  Patient was last seen by nephrologist May 2023.  He had follow up with urlogist 10/01/2021. Still taking Xtandi 1102m daily for metastatic prostate cancer.  Home blood glucose has been in range though A1c increased.  Patient requested assistance getting levothyroxine and Januvia thru [facility] mail order and to request refill for other medications.   Recommendations/Changes made from today's visit: Continue to hold alendronate until further instructions due to CrCl <35 mL/min Sent in updated prescription to mail order Rx to [facility] at patient request for atorvastatin, ezetimibe, levothyroxine, Januvia and metformin.   Has follow up with PCP in August 2023; Clinical Pharmacist Practitioner will check back with patient in 3 months.     Subjective: Bellamy Z PMcenroeis an 88y.o. year old male who is a primary patient of Paz, JAlda Berthold MD.  The CCM team was consulted for assistance with disease management and care coordination needs.    Engaged with patient by telephone for follow up visit in response to provider referral for pharmacy case management and/or care coordination services.   Consent to Services:  The patient was given information about Chronic Care Management services, agreed to services, and gave verbal consent prior to initiation of services.  Please see initial visit note for detailed documentation.   Patient Care Team: PColon Branch MD as PCP - General (Internal Medicine) TRoel Cluck MD as Referring Physician (Ophthalmology) ECherre Robins RPH-CPP (Pharmacist) GJanith Lima MD as Consulting Physician (Urology) SGean Quint MD as Consulting Physician (Nephrology)  Recent office visits: 09/04/2021 - PCP office - phone  call. patient called and reported headaches that he thought was related to levothyroxine dose and wanted to decrease. Last TSH was WNL - Dr PLarose Kellsrecommended no change and for patient to make appointment for evaluation. Patient declined appt on 09/10/2021 stating headaches were better.  08/13/2021 - Int Med (Dr PLarose Kells Follow up chronic conditions. Labs checked. Scr stable and improved. Potassium was elevated and patient was instructed to take Lokelma twice a day for 3 days, then resume daily thereafter.  05/14/2021- Int Med (Dr PLarose Kells Follow up. Potassium noted to be very elevated. Restarted Lokelma 10grams daily. [facility] Seen for lower respiritory infection. Prescribed albuterol inhaler, amoxicillin -clavulanate 875 bid for 5 days and ipratropium nasal spray.   Recent consult visits:  10/01/2021 - Urology (Dr GAbner Greenspan F/U prostate cancer. Continue Xtandi. Check PSA.  09/09/2021 - Nephrology (Dr SCandiss Norse F/U CKD, renal cyst, hydronephrosis and metastatic prostate cancer. No med changes noted. Checked labs.  07/02/2021 - urology (Dr GAbner Greenspan f/u prostate cancer. Given 457mEligard today. Considering adding antiandrogen therapy. Recommended weight bearing exercise, calcium and vitamin D supplement. Considering Xgeva if renal function allows.  05/29/2021 - Urology (Dr GaAbner Greenspannewly diagnoses hormone naive metastatic prostate cancer. Given Firmagon 24019m/24/2023. Discussed therapy. Patient will come in for Eligard in 1 months. Patinet declined referral to oncology. Ordered bone scan and discussed Xgeva. 05/21/2021 - Urology (Dr GayAbner Greenspan/U right hydronephrosis and left renal cyst with elevated PSA. TRUS biopsy performed for presumed metastatic prostate cancer. Elected continue surveillance.    Hospital visits: None in previous 6 months  Objective:  Lab Results  Component Value Date   CREATININE 1.7 (  A) 09/09/2021   CREATININE 1.65 (H) 08/13/2021   CREATININE 1.9 (A) 06/06/2021    Lab  Results  Component Value Date   HGBA1C 7.7 (H) 08/13/2021   Last diabetic Eye exam:  Lab Results  Component Value Date/Time   HMDIABEYEEXA No Retinopathy 03/04/2021 12:00 AM    Last diabetic Foot exam:  Lab Results  Component Value Date/Time   HMDIABFOOTEX Normal/c/o feet pain 03/08/2015 12:00 AM        Component Value Date/Time   CHOL 132 08/13/2021 0956   TRIG 91.0 08/13/2021 0956   HDL 61.00 08/13/2021 0956   CHOLHDL 2 08/13/2021 0956   VLDL 18.2 08/13/2021 0956   LDLCALC 53 08/13/2021 0956       Latest Ref Rng & Units 09/09/2021   12:00 AM 06/06/2021   12:00 AM 05/14/2021    9:23 AM  Hepatic Function  Total Protein 6.0 - 8.3 g/dL   7.5   Albumin 3.5 - 5.0 4.4     4.5     4.3   AST 0 - 37 U/L   29   ALT 0 - 53 U/L   22   Alk Phosphatase 39 - 117 U/L   113   Total Bilirubin 0.2 - 1.2 mg/dL   0.6      This result is from an external source.    Lab Results  Component Value Date/Time   TSH 5.46 08/13/2021 09:56 AM   TSH 4.62 05/14/2021 09:23 AM   FREET4 0.72 10/01/2017 03:59 PM       Latest Ref Rng & Units 06/06/2021   12:00 AM 05/14/2021    9:23 AM 03/05/2021   12:00 AM  CBC  WBC  6.9     6.2  5.8      Hemoglobin 13.5 - 17.5 12.5     11.9  11.2      Hematocrit 41 - 53 39     37.8  34      Platelets 150 - 400 K/uL 292     223.0  194         This result is from an external source.    No results found for: "VD25OH"  Clinical ASCVD: Yes  The ASCVD Risk score (Arnett DK, et al., 2019) failed to calculate for the following reasons:   The 2019 ASCVD risk score is only valid for ages 56 to 62    Other:  DEXA 11/17/2019 Lowest T-Score at right femoral neck = -1.5 (Compared to 09/07/2013 score was -1.9) Patient classified as osteoporosis due to h/o of hip fracture.   Social History   Tobacco Use  Smoking Status Never  Smokeless Tobacco Never   BP Readings from Last 3 Encounters:  08/13/21 122/68  05/14/21 122/74  05/03/21 (!) 146/73   Pulse Readings  from Last 3 Encounters:  08/13/21 71  05/14/21 68  05/03/21 72   Wt Readings from Last 3 Encounters:  08/13/21 143 lb 4 oz (65 kg)  05/14/21 143 lb 4 oz (65 kg)  02/11/21 146 lb 6 oz (66.4 kg)    Assessment: Review of patient past medical history, allergies, medications, health status, including review of consultants reports, laboratory and other test data, was performed as part of comprehensive evaluation and provision of chronic care management services.   SDOH:  (Social Determinants of Health) assessments and interventions performed:  SDOH Interventions    Flowsheet Row Most Recent Value  SDOH Interventions   Financial Strain Interventions Intervention Not Indicated  CCM Care Plan  No Known Allergies  Medications Reviewed Today     Reviewed by [name], RPH-CPP (Pharmacist) on 10/30/21 at 1024  Med List Status: <None>   Medication Order Taking? Sig Documenting Provider Last Dose Status Informant  Accu-Chek Softclix Lancets lancets [id number] Yes CHECK BLOOD SUGAR ONCE  DAILY AS DIRECTED Colon Branch, MD Taking Active   Alcohol Swabs PADS [id number] Yes Use to clean finger prior to testing blood sugars Colon Branch, MD Taking Active   alendronate (FOSAMAX) 70 MG tablet [id number] No TAKE 1 TABLET(70 MG) BY MOUTH 1 TIME A WEEK WITH A FULL GLASS OF WATER AND ON AN EMPTY STOMACH  Patient not taking: Reported on 10/30/2021   Colon Branch, MD Not Taking Active            Med Note Antony Contras, [facility] B   Fri Jul 19, 2021 12:02 PM) On hold due to low eGFR / creatinine clearance.   aspirin EC 81 MG EC tablet [id number] Yes Take 1 tablet (81 mg total) by mouth daily. Murlean Iba, MD Taking Active   atorvastatin (LIPITOR) 40 MG tablet [id number] Yes Take 1 tablet (40 mg total) by mouth at bedtime. Colon Branch, MD Taking Active   Blood Glucose Calibration (ACCU-CHEK GUIDE CONTROL) Yehuda Budd [id number] Yes Use as directed Colon Branch, MD Taking Active   Blood Glucose Monitoring  Suppl (ACCU-CHEK GUIDE) w/Device Drucie Opitz [id number] Yes Check blood sugars once daily Colon Branch, MD Taking Active   Calcium Carb-Cholecalciferol (CALCIUM 500 + D) 500-125 MG-UNIT TABS [id number] Yes Take 1 tablet by mouth daily. [provider] Taking Active   Cyanocobalamin (VITAMIN B-12 PO) [id number] Yes Take 2,500 mcg by mouth daily. [provider] Taking Active   ezetimibe (ZETIA) 10 MG tablet [id number] Yes Take 1 tablet (10 mg total) by mouth daily. Colon Branch, MD Taking Active   glucose blood (ACCU-CHEK GUIDE) test strip [id number] Yes USE TO CHECK BLOOD SUGAR  ONCE DAILY Colon Branch, MD Taking Active   levothyroxine (SYNTHROID) 100 MCG tablet [id number] Yes Take 1 tablet (100 mcg total) by mouth daily before breakfast. Colon Branch, MD Taking Active   metFORMIN (GLUCOPHAGE) 500 MG tablet [id number] Yes Take 1 tablet (500 mg total) by mouth daily. *take with a mealLarose Kells, Alda Berthold, MD Taking Active   Multiple Vitamin (MULTI-VITAMIN DAILY PO) [id number] Yes Take 1 tablet by mouth daily. [provider] Taking Active   nitroGLYCERIN (NITROSTAT) 0.4 MG SL tablet [id number]  Place 1 tablet (0.4 mg total) under the tongue every 5 (five) minutes x 3 doses as needed for chest pain. Colon Branch, MD  Active            Med Note (CANTER, [facility] D   Tue Aug 13, 2021  9:05 AM) PRN  sitaGLIPtin (JANUVIA) 50 MG tablet [id number] Yes Take 1 tablet (50 mg total) by mouth daily. Colon Branch, MD Taking Active   sodium zirconium cyclosilicate (LOKELMA) 10 g PACK packet [id number] Yes Take 10 g by mouth daily. Or as directed by physician Colon Branch, MD Taking Active   XTANDI 40 MG tablet [id number] Yes Take 120 mg by mouth daily. [provider] Taking Active             Patient Active Problem List   Diagnosis Date Noted   Prostate cancer ([place]) 05/29/2021   CKD (chronic kidney disease) stage 3, GFR 30-59 ml/min ([place]) 02/11/2021   CAD (  coronary artery disease) 03/06/2020    Hypothyroidism 12/02/2017   Chronic pain of right knee 10/13/2017   Chest pain 11/19/2016   Amaurosis fugax 12/12/2015   PCP NOTES >>> 03/08/2015   Osteoporosis 09/15/2014   Primary osteoarthritis of both knees 03/16/2014   Hearing difficulty 10/20/2013   DJD (degenerative joint disease) 08/25/2013   Neck mass 04/25/2013   Hyperlipidemia 04/25/2013   Erectile dysfunction 12/26/2011   Annual physical exam 08/22/2011   Prostate nodule 08/22/2011   Diabetes mellitus ([place]) 05/07/2011    Immunization History  Administered Date(s) Administered   Influenza-Unspecified 03/05/2021   PFIZER(Purple Top)SARS-COV-2 Vaccination 05/20/2019, 06/10/2019, 02/02/2020    Conditions to be addressed/monitored: CAD, HLD, DMII, and hypothyroidism; CKD; constipation; osteoporosis  Patient Care Plan: General Pharmacy (Adult)     Problem Identified: type 2 DM; hyperlipidemia; CAD; ED; osteoporosis; hypothyroidism; CKD; OA / DJD; constipation   Priority: High     Long-Range Goal: Medication and Chronic Care Management   Start Date: 10/24/2020  Recent Progress: On track  Priority: High  Note:   Current Barriers:  Does not adhere to prescribed medication regimen Does not contact provider office for questions/concerns Changing renal function Newly diagnosed metastatic prostate cancer - started therapy  Pharmacist Clinical Goal(s):  Over the next 90 days, patient will achieve adherence to monitoring guidelines and medication adherence to achieve therapeutic efficacy maintain control of hyperlipidemia and type 2 DM as evidenced by maintaining LDL <70 and A1c < 7.0%  adhere to prescribed medication regimen as evidenced by fill history and pill count  through collaboration with PharmD and provider.  Monitor renal function and adjust medication therapy as needed  Interventions: 1:1 collaboration with Colon Branch, MD regarding development and update of comprehensive plan of care as evidenced by provider  attestation and co-signature Inter-disciplinary care team collaboration (see longitudinal plan of care) Comprehensive medication review performed; medication list updated in electronic medical record  Hyperlipidemia Controlled;  LDL goal < 70 Current regimen:  Atorvastatin 61m daily  Ezetimibe 141mdaily Tolerating regimen well without side effects Noted that ezetimibe and atorvastatin last fill for #100 day supply 07/19/2021. Patient reports he will run out soon. Called [facility] mail order and these are not on auto refill (patient has to request)  Interventions: Emphasized the importance of taking both cholesterol medications daily. Education provided on the different mechanisms by which atorvastatin and ezetimibe lower cholesterol Coordinated refill at pharmacy - sent in updated prescriptions for #100 days / 1 RF for both ezetimibe and atorvastatin Reviewed LDL goal  Diabetes Controlled; A1c goal <7.5% (given recently changes in chronic conditions) Lab Results  Component Value Date   HGBA1C 7.7 (H) 08/13/2021   Lab Results  Component Value Date   CREATININE 1.7 (A) 09/09/2021   CREATININE 1.65 (H) 08/13/2021   CREATININE 1.9 (A) 06/06/2021  Most recent eGFR shows improvement.  Recent home BG readings: 115 to 130 per patient report Current regimen:  Metformin 50029m take 1 tablet once a day Januvia 33m34mily (dose lowered due to renal function 10/24/2020) Interventions: Reviewed dose of Januvia and metformin based on renal function. No further dose adjustments needed currently.  [facility] and Januvia Emphasized the importance of medication adherence Continue to check blood glucose once daily - recommended vary time of day of blood glucose check (mornings, evenings, before and after meals) , document, and provide at future appointments Reminded to have yearly eye exam  Hypothyroidism Controlled Goal: maintain TSH  within  normal limits Last TSH was in therapeutic range Lab Results  Component Value Date   TSH 5.46 08/13/2021  Current regimen:  Levothyroxine 140mg daily  Interventions: Emphasized the importance of medication adherence [facility]  Osteoporosis Goal: reduce risk of fracture due to osteoporosis  History of hip fracture Estimated Creatinine clearance = 29 mL/min (based on Scr of 1.7, age [age]and weight of 143lbs) DEXA 11/17/2019: Lowest T-Score was -1.5 at right femoral neck  Compared to previous T-Score from 09/07/2013 of -1.9 has improved.  Current regimen:  Alendronate 746m- holding due to CrCL < 35 mL/min  Calcium 50058maily/Vitamin D 5000 units daily  Interventions: Continue to hold alendronate - patient endorsed that he is not taking alendronate Ensure daily intake of at least 1200m27m calcium daily through diet and/or supplementation Ensure daily intake of at least 1000 units of vitamin D through supplementation   CKD - 3B Renal function low but stable Had hyperkalemia - Lokelma restarted - 1 packet every other day.  Patient is following low potassium diet Managed by Dr SingCandiss Norseervention:  See above for dose adjustments / discontinuations based on renal function.  Continue to follow up with Dr SingCandiss Norseedication management Pharmacist Clinical Goal(s): Over the next 90 days, patient will work with PharmD and providers to achieve optimal medication adherence Current pharmacy: [facility]; [facility] Interventions Comprehensive medication review performed. Continue current medication management strategy Reviewed refill history.  Coordinated refills to Mail order pharmacy at patient request  Patient Goals/Self-Care Activities Over the next 90 days, patient will:  take medications as prescribed,  check glucose daily - recommend checking at different times of day - sometimes in morning, evening, before meals and after  meals. Document, and provide at future appointments Home blood glucose goals  Fasting blood glucose goal (before meals) = 80 to 130 Blood glucose goal after a meal = less than 180  Continue to hold alendronate We have updated prescriptions at your mail order pharmacy. You should receive a delivery in 3 to 5 days.   Follow Up Plan: F/U with clinical pharmacist in 2  to 3 months to check medication adherence / blood glucose          Medication Assistance: None required.  Patient affirms current coverage meets needs.  [place] [facility] [id number] [address]: [phone]: [phone] ([facility]: [phone]: [phone] ([facility] ) - [address]: [phone]: [phone] Up:  Patient agrees to Care Plan and Follow-up.  Plan: Chronic Care Management pharmacist will check back with patient in 2 to 3 months  TammCherre RobinsarmD Clinical Pharmacist [place]-[phone]

## 2021-11-01 DIAGNOSIS — N1832 Chronic kidney disease, stage 3b: Secondary | ICD-10-CM | POA: Diagnosis not present

## 2021-11-01 DIAGNOSIS — E1159 Type 2 diabetes mellitus with other circulatory complications: Secondary | ICD-10-CM | POA: Diagnosis not present

## 2021-11-01 DIAGNOSIS — E785 Hyperlipidemia, unspecified: Secondary | ICD-10-CM

## 2021-11-01 DIAGNOSIS — E1122 Type 2 diabetes mellitus with diabetic chronic kidney disease: Secondary | ICD-10-CM | POA: Diagnosis not present

## 2021-11-01 DIAGNOSIS — I2510 Atherosclerotic heart disease of native coronary artery without angina pectoris: Secondary | ICD-10-CM

## 2021-11-01 DIAGNOSIS — Z7984 Long term (current) use of oral hypoglycemic drugs: Secondary | ICD-10-CM

## 2021-11-01 DIAGNOSIS — E039 Hypothyroidism, unspecified: Secondary | ICD-10-CM

## 2021-12-06 DIAGNOSIS — E875 Hyperkalemia: Secondary | ICD-10-CM | POA: Diagnosis not present

## 2021-12-06 DIAGNOSIS — D631 Anemia in chronic kidney disease: Secondary | ICD-10-CM | POA: Diagnosis not present

## 2021-12-06 DIAGNOSIS — N183 Chronic kidney disease, stage 3 unspecified: Secondary | ICD-10-CM | POA: Diagnosis not present

## 2021-12-06 DIAGNOSIS — N281 Cyst of kidney, acquired: Secondary | ICD-10-CM | POA: Diagnosis not present

## 2021-12-06 DIAGNOSIS — N1330 Unspecified hydronephrosis: Secondary | ICD-10-CM | POA: Diagnosis not present

## 2021-12-06 DIAGNOSIS — N2581 Secondary hyperparathyroidism of renal origin: Secondary | ICD-10-CM | POA: Diagnosis not present

## 2021-12-06 LAB — CBC AND DIFFERENTIAL
HCT: 36 — AB (ref 41–53)
Hemoglobin: 11.9 — AB (ref 13.5–17.5)
Neutrophils Absolute: 3.3
Platelets: 230 10*3/uL (ref 150–400)
WBC: 6.1

## 2021-12-06 LAB — BASIC METABOLIC PANEL
BUN: 32 — AB (ref 4–21)
CO2: 25 — AB (ref 13–22)
Chloride: 105 (ref 99–108)
Creatinine: 1.6 — AB (ref 0.6–1.3)
Glucose: 124
Potassium: 5.7 mEq/L — AB (ref 3.5–5.1)
Sodium: 139 (ref 137–147)

## 2021-12-06 LAB — COMPREHENSIVE METABOLIC PANEL
Albumin: 4.5 (ref 3.5–5.0)
Calcium: 10.5 (ref 8.7–10.7)
eGFR: 42

## 2021-12-06 LAB — CBC: RBC: 4.33 (ref 3.87–5.11)

## 2021-12-13 ENCOUNTER — Ambulatory Visit (INDEPENDENT_AMBULATORY_CARE_PROVIDER_SITE_OTHER): Payer: Medicare Other | Admitting: Internal Medicine

## 2021-12-13 VITALS — BP 140/62 | HR 61 | Temp 97.8°F | Resp 18 | Ht 69.0 in | Wt 142.2 lb

## 2021-12-13 DIAGNOSIS — E1121 Type 2 diabetes mellitus with diabetic nephropathy: Secondary | ICD-10-CM

## 2021-12-13 DIAGNOSIS — E039 Hypothyroidism, unspecified: Secondary | ICD-10-CM | POA: Diagnosis not present

## 2021-12-13 DIAGNOSIS — N1832 Chronic kidney disease, stage 3b: Secondary | ICD-10-CM | POA: Diagnosis not present

## 2021-12-13 DIAGNOSIS — M810 Age-related osteoporosis without current pathological fracture: Secondary | ICD-10-CM

## 2021-12-13 LAB — BASIC METABOLIC PANEL
BUN: 26 mg/dL — ABNORMAL HIGH (ref 6–23)
CO2: 28 mEq/L (ref 19–32)
Calcium: 10 mg/dL (ref 8.4–10.5)
Chloride: 103 mEq/L (ref 96–112)
Creatinine, Ser: 1.59 mg/dL — ABNORMAL HIGH (ref 0.40–1.50)
GFR: 39.04 mL/min — ABNORMAL LOW (ref >60.00)
Glucose, Bld: 122 mg/dL — ABNORMAL HIGH (ref 70–99)
Potassium: 4.7 mEq/L (ref 3.5–5.1)
Sodium: 140 mEq/L (ref 135–145)

## 2021-12-13 LAB — TSH: TSH: 4.03 u[IU]/mL (ref 0.35–5.50)

## 2021-12-13 LAB — HEMOGLOBIN A1C: Hgb A1c MFr Bld: 7.2 % — ABNORMAL HIGH (ref 4.6–6.5)

## 2021-12-13 NOTE — Progress Notes (Signed)
Subjective:    Patient ID: Marco [name], male    DOB: Jul 08, 1933, 88 y.o.   MRN: [id number]  DOS:  12/13/2021 Type of visit - description: f/u  Since the last office visit is doing well. Reportedly saw nephrology blood work was done few days ago. BP today is elevated, at home is in the 120s.  Denies chest pain or difficulty breathing. No edema.  Review of Systems See above   Past Medical History:  Diagnosis Date   Amaurosis fugax 2017   Diabetes mellitus    DJD (degenerative joint disease) 08/25/2013   Elevated PSA    Other and unspecified hyperlipidemia 04/25/2013   Prostate cancer [facility])     Past Surgical History:  Procedure Laterality Date   CATARACT EXTRACTION     Bilaterally   TYMPANIC MEMBRANE REPAIR     R    Current Outpatient Medications  Medication Instructions   Accu-Chek Softclix Lancets lancets CHECK BLOOD SUGAR ONCE  DAILY AS DIRECTED   Alcohol Swabs PADS Use to clean finger prior to testing blood sugars   alendronate (FOSAMAX) 70 MG tablet TAKE 1 TABLET(70 MG) BY MOUTH 1 TIME A WEEK WITH A FULL GLASS OF WATER AND ON AN EMPTY STOMACH   aspirin EC 81 mg, Oral, Daily   atorvastatin (LIPITOR) 40 mg, Oral, Daily at bedtime   Blood Glucose Calibration (ACCU-CHEK GUIDE CONTROL) LIQD Use as directed   Blood Glucose Monitoring Suppl (ACCU-CHEK GUIDE) w/Device KIT Check blood sugars once daily   Calcium Carb-Cholecalciferol (CALCIUM 500 + D) 500-125 MG-UNIT TABS 1 tablet, Oral, Daily   Cyanocobalamin (VITAMIN B-12 PO) 2,500 mcg, Oral, Daily   ezetimibe (ZETIA) 10 mg, Oral, Daily   glucose blood (ACCU-CHEK GUIDE) test strip USE TO CHECK BLOOD SUGAR  ONCE DAILY   levothyroxine (SYNTHROID) 100 mcg, Oral, Daily before breakfast   Lokelma 10 g, Oral, Daily, Or as directed by physician   metFORMIN (GLUCOPHAGE) 500 mg, Oral, Daily, *take with a meal*   Multiple Vitamin (MULTI-VITAMIN DAILY PO) 1 tablet, Oral, Daily   nitroGLYCERIN (NITROSTAT) 0.4 mg, Sublingual,  Every 5 min x3 PRN   sitaGLIPtin (JANUVIA) 50 mg, Oral, Daily   Xtandi 120 mg, Oral, Daily       Objective:   Physical Exam BP (!) 140/62 (BP Location: Right Arm, Patient Position: Sitting, Cuff Size: Normal)   Pulse 61   Temp 97.8 F (36.6 C) (Temporal)   Resp 18   Ht '5\' 9"'  (1.753 m)   Wt 142 lb 3.2 oz (64.5 kg)   SpO2 98%   BMI 21.00 kg/m  General:   Well developed, NAD, BMI noted. HEENT:  Normocephalic . Face symmetric, atraumatic Lungs:  CTA B Normal respiratory effort, no intercostal retractions, no accessory muscle use. Heart: RRR,  no murmur.  Lower extremities: no pretibial edema bilaterally  Skin: Not pale. Not jaundice Neurologic:  alert & oriented X3.  Speech normal . Elderly gentleman, walks very slow, uses a cane. Psych--  Cognition and judgment appear intact.  Cooperative with normal attention span and concentration.  Behavior appropriate. No anxious or depressed appearing.      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 Osteoporosis: T score -1.5, hip  fracture Osteopenia : DEXA 2015, Rx calcium and vitamin D.  DEXA 11-2019: Rx Fosamax ED Elevated PSA-prostate nodule:saw urology multiple times, last ~ 2013, declined a bx, declined further eval, aware of risks HOH  PLAN: DM: Last A1c 7.7, acceptable in this 88 year old gentleman.  Continue metformin (creatinine pending).  Check A1c.  Further advised with results CKD, Hyperkalemia:  At the last visit, hyperkalemia noted, blood work redrawn nephrology 09/09/2021: Sodium 139, potassium 5.7, calcium 10.5.  Hemoglobin 11.8. He is on Lokelma, BMP was rechecked 10 days ago @ nephrology but patient  is not aware of results and requests recheck.  Will do.  Encouraged to call nephrology whenever labs are done. Hypothyroidism: Check TSH High cholesterol: Last LDL 53 at, well-controlled Osteoporosis: Holding Fosamax due to CKD, if GFR is more than 35 we will be able to restart it. Preventive care: Recommend COVID booster and flu shot this fall RTC 4 months

## 2021-12-13 NOTE — Patient Instructions (Addendum)
Vaccines I recommend: COVID booster Flu shot this fall   Check the  blood pressure regularly BP GOAL is between 110/65 and  135/85. If it is consistently higher or lower, let me know  If you do not hear from your kidney doctor (nephrologist) when they draw blood work please call his office.   GO TO THE LAB : Get the blood work     [place] back for   a checkup in 4 months

## 2021-12-13 NOTE — Assessment & Plan Note (Signed)
DM: Last A1c 7.7, acceptable in this 88 year old gentleman.  Continue metformin (creatinine pending).  Check A1c.  Further advised with results CKD, Hyperkalemia:  At the last visit, hyperkalemia noted, blood work redrawn nephrology 09/09/2021: Sodium 139, potassium 5.7, calcium 10.5.  Hemoglobin 11.8. He is on Lokelma, BMP was rechecked 10 days ago @ nephrology but patient is not aware of results and requests recheck.  Will do.  Encouraged to call nephrology whenever labs are done. Hypothyroidism: Check TSH High cholesterol: Last LDL 53 at, well-controlled Osteoporosis: Holding Fosamax due to CKD, if GFR is more than 35 we will be able to restart it. Preventive care: Recommend COVID booster and flu shot this fall RTC 4 months

## 2021-12-17 ENCOUNTER — Encounter: Payer: Self-pay | Admitting: Internal Medicine

## 2021-12-25 LAB — PSA: PSA: 18.9

## 2021-12-26 DIAGNOSIS — N1832 Chronic kidney disease, stage 3b: Secondary | ICD-10-CM | POA: Diagnosis not present

## 2021-12-27 ENCOUNTER — Encounter (HOSPITAL_BASED_OUTPATIENT_CLINIC_OR_DEPARTMENT_OTHER): Payer: Self-pay | Admitting: Emergency Medicine

## 2021-12-27 ENCOUNTER — Emergency Department (HOSPITAL_BASED_OUTPATIENT_CLINIC_OR_DEPARTMENT_OTHER)
Admission: EM | Admit: 2021-12-27 | Discharge: 2021-12-27 | Disposition: A | Discharge status: Home / Self Care | Payer: Medicare Other | Attending: Emergency Medicine | Admitting: Emergency Medicine

## 2021-12-27 ENCOUNTER — Other Ambulatory Visit: Payer: Self-pay

## 2021-12-27 DIAGNOSIS — I2510 Atherosclerotic heart disease of native coronary artery without angina pectoris: Secondary | ICD-10-CM | POA: Diagnosis not present

## 2021-12-27 DIAGNOSIS — E876 Hypokalemia: Secondary | ICD-10-CM | POA: Diagnosis not present

## 2021-12-27 DIAGNOSIS — R9431 Abnormal electrocardiogram [ECG] [EKG]: Secondary | ICD-10-CM | POA: Diagnosis not present

## 2021-12-27 DIAGNOSIS — R519 Headache, unspecified: Secondary | ICD-10-CM | POA: Diagnosis not present

## 2021-12-27 DIAGNOSIS — Z7984 Long term (current) use of oral hypoglycemic drugs: Secondary | ICD-10-CM | POA: Diagnosis not present

## 2021-12-27 DIAGNOSIS — B029 Zoster without complications: Secondary | ICD-10-CM

## 2021-12-27 DIAGNOSIS — Z7982 Long term (current) use of aspirin: Secondary | ICD-10-CM | POA: Insufficient documentation

## 2021-12-27 DIAGNOSIS — E875 Hyperkalemia: Secondary | ICD-10-CM

## 2021-12-27 DIAGNOSIS — Z8546 Personal history of malignant neoplasm of prostate: Secondary | ICD-10-CM | POA: Diagnosis not present

## 2021-12-27 DIAGNOSIS — E1122 Type 2 diabetes mellitus with diabetic chronic kidney disease: Secondary | ICD-10-CM | POA: Insufficient documentation

## 2021-12-27 DIAGNOSIS — N189 Chronic kidney disease, unspecified: Secondary | ICD-10-CM | POA: Insufficient documentation

## 2021-12-27 DIAGNOSIS — R21 Rash and other nonspecific skin eruption: Secondary | ICD-10-CM | POA: Diagnosis present

## 2021-12-27 LAB — CBC WITH DIFFERENTIAL/PLATELET
Abs Immature Granulocytes: 0.02 10*3/uL (ref 0.00–0.07)
Basophils Absolute: 0.1 10*3/uL (ref 0.0–0.1)
Basophils Relative: 1 %
Eosinophils Absolute: 0.6 10*3/uL — ABNORMAL HIGH (ref 0.0–0.5)
Eosinophils Relative: 11 %
HCT: 35.3 % — ABNORMAL LOW (ref 39.0–52.0)
Hemoglobin: 11.4 g/dL — ABNORMAL LOW (ref 13.0–17.0)
Immature Granulocytes: 0 %
Lymphocytes Relative: 20 %
Lymphs Abs: 1.1 10*3/uL (ref 0.7–4.0)
MCH: 27.9 pg (ref 26.0–34.0)
MCHC: 32.3 g/dL (ref 30.0–36.0)
MCV: 86.3 fL (ref 80.0–100.0)
Monocytes Absolute: 0.6 10*3/uL (ref 0.1–1.0)
Monocytes Relative: 12 %
Neutro Abs: 2.9 10*3/uL (ref 1.7–7.7)
Neutrophils Relative %: 56 %
Platelets: 192 10*3/uL (ref 150–400)
RBC: 4.09 MIL/uL — ABNORMAL LOW (ref 4.22–5.81)
RDW: 13.3 % (ref 11.5–15.5)
WBC: 5.3 10*3/uL (ref 4.0–10.5)
nRBC: 0 % (ref 0.0–0.2)

## 2021-12-27 LAB — COMPREHENSIVE METABOLIC PANEL
ALT: 15 U/L (ref 0–44)
AST: 29 U/L (ref 15–41)
Albumin: 3.8 g/dL (ref 3.5–5.0)
Alkaline Phosphatase: 67 U/L (ref 38–126)
Anion gap: 8 (ref 5–15)
BUN: 29 mg/dL — ABNORMAL HIGH (ref 8–23)
CO2: 25 mmol/L (ref 22–32)
Calcium: 9.5 mg/dL (ref 8.9–10.3)
Chloride: 105 mmol/L (ref 98–111)
Creatinine, Ser: 1.58 mg/dL — ABNORMAL HIGH (ref 0.61–1.24)
GFR, Estimated: 42 mL/min — ABNORMAL LOW (ref >60)
Glucose, Bld: 113 mg/dL — ABNORMAL HIGH (ref 70–99)
Potassium: 5.3 mmol/L — ABNORMAL HIGH (ref 3.5–5.1)
Sodium: 138 mmol/L (ref 135–145)
Total Bilirubin: 0.7 mg/dL (ref 0.3–1.2)
Total Protein: 7.6 g/dL (ref 6.5–8.1)

## 2021-12-27 LAB — POTASSIUM: Potassium: 5 mmol/L (ref 3.5–5.1)

## 2021-12-27 MED ORDER — VALACYCLOVIR HCL 500 MG PO TABS
1000.0000 mg | ORAL_TABLET | Freq: Once | ORAL | Status: AC
  Administered 2021-12-27: 1000 mg via ORAL
  Filled 2021-12-27: qty 2

## 2021-12-27 MED ORDER — PREDNISONE 50 MG PO TABS: ORAL_TABLET | ORAL | 0 refills | Status: DC

## 2021-12-27 MED ORDER — VALACYCLOVIR HCL 1 G PO TABS: 1000.0000 mg | ORAL_TABLET | Freq: Three times a day (TID) | ORAL | 0 refills | Status: DC

## 2021-12-27 MED ORDER — PREDNISONE 50 MG PO TABS
60.0000 mg | ORAL_TABLET | Freq: Once | ORAL | Status: AC
  Administered 2021-12-27: 60 mg via ORAL
  Filled 2021-12-27: qty 1

## 2021-12-27 MED ORDER — VALACYCLOVIR HCL 1 G PO TABS: 1000.0000 mg | ORAL_TABLET | Freq: Two times a day (BID) | ORAL | 0 refills | Status: DC

## 2021-12-27 MED ORDER — SODIUM ZIRCONIUM CYCLOSILICATE 10 G PO PACK
10.0000 g | PACK | Freq: Once | ORAL | Status: AC
  Administered 2021-12-27: 10 g via ORAL
  Filled 2021-12-27: qty 1

## 2021-12-27 NOTE — ED Triage Notes (Signed)
Patient arrived via POV c/o painful rash to left ear and neck x 2 days. Patient states itching and burning sensation. Patient states 8/10 pain. Patient is AO x 4, VS WDL, normal gait.

## 2021-12-27 NOTE — Discharge Instructions (Addendum)
Take the antiviral medication and steroids as prescribed.  Your potassium was slightly high again today and you should take your [facility] as prescribed.  Watch your blood sugars closely while taking the steroids as they may be increased.  Follow-up for recheck with your doctor in 2 days.  You may also follow-up with ear nose and throat doctor for recheck of the ear.  Return to the ED with worsening symptoms including facial droop, difficulty speaking, difficulty swallowing, fever, visual changes, weakness in your facial muscles or any other concerns.

## 2021-12-27 NOTE — ED Provider Notes (Signed)
[facility] EMERGENCY DEPARTMENT Provider Note   CSN: [id number] Arrival date & time: 12/27/21  1951     History  Chief Complaint  Patient presents with   Rash    Marco [name] is a 88 y.o. male.  Patient with a history of diabetes, CKD, CAD, prostate cancer presenting with rash to his left neck and ear for the past 2 days.  They state it started as some red blisters behind his left ear and have since spread down his neck to his shoulder.  Denies any change in hearing or ringing in the ear.  Does have some headache on that side.  No fever, chills, nausea or vomiting.  No change in vision.  No focal weakness, numbness or tingling.  He has been using Neosporin at home without relief.  Denies any new contacts or new exposures.  He describes burning and itching.  He is concerned for shingles.  Has never had shingles before.  The history is provided by the patient and a relative.  Rash      Home Medications Prior to Admission medications   Medication Sig Start Date End Date Taking? Authorizing Provider  Accu-Chek Softclix Lancets lancets CHECK BLOOD SUGAR ONCE  DAILY AS DIRECTED 10/29/21   Colon Branch, MD  Alcohol Swabs PADS Use to clean finger prior to testing blood sugars 03/13/21   Colon Branch, MD  alendronate (FOSAMAX) 70 MG tablet TAKE 1 TABLET(70 MG) BY MOUTH 1 TIME A WEEK WITH A FULL GLASS OF WATER AND ON AN EMPTY STOMACH Patient not taking: Reported on 10/30/2021 04/30/21   Colon Branch, MD  aspirin EC 81 MG EC tablet Take 1 tablet (81 mg total) by mouth daily. 11/22/16   Johnson, Clanford L, MD  atorvastatin (LIPITOR) 40 MG tablet Take 1 tablet (40 mg total) by mouth at bedtime. 10/30/21   Colon Branch, MD  Blood Glucose Calibration (ACCU-CHEK GUIDE CONTROL) LIQD Use as directed 12/24/20   Colon Branch, MD  Blood Glucose Monitoring Suppl (ACCU-CHEK GUIDE) w/Device KIT Check blood sugars once daily 03/13/21   Colon Branch, MD  Calcium Carb-Cholecalciferol (CALCIUM 500 + D)  500-125 MG-UNIT TABS Take 1 tablet by mouth daily.    [provider]  Cyanocobalamin (VITAMIN B-12 PO) Take 2,500 mcg by mouth daily.    [provider]  ezetimibe (ZETIA) 10 MG tablet Take 1 tablet (10 mg total) by mouth daily. 10/30/21   Colon Branch, MD  glucose blood (ACCU-CHEK GUIDE) test strip USE TO CHECK BLOOD SUGAR  ONCE DAILY 10/29/21   Colon Branch, MD  levothyroxine (SYNTHROID) 100 MCG tablet Take 1 tablet (100 mcg total) by mouth daily before breakfast. 10/30/21   Colon Branch, MD  metFORMIN (GLUCOPHAGE) 500 MG tablet Take 1 tablet (500 mg total) by mouth daily. *take with a meal* 10/30/21   Colon Branch, MD  Multiple Vitamin (MULTI-VITAMIN DAILY PO) Take 1 tablet by mouth daily.    [provider]  nitroGLYCERIN (NITROSTAT) 0.4 MG SL tablet Place 1 tablet (0.4 mg total) under the tongue every 5 (five) minutes x 3 doses as needed for chest pain. 05/14/21   Colon Branch, MD  sitaGLIPtin (JANUVIA) 50 MG tablet Take 1 tablet (50 mg total) by mouth daily. 10/30/21   Colon Branch, MD  sodium zirconium cyclosilicate (LOKELMA) 10 g PACK packet Take 10 g by mouth daily. Or as directed by physician 07/19/21   [name]  E, MD  XTANDI 40 MG tablet Take 120 mg by mouth daily. 07/12/21   [provider]      Allergies    Patient has no known allergies.    Review of Systems   Review of Systems  Skin:  Positive for rash.    Physical Exam Updated Vital Signs BP (!) 163/88   Pulse 69   Temp (!) 97.5 F (36.4 C) (Oral)   Resp 18   Ht '5\' 8"'  (1.727 m)   Wt 63.5 kg   SpO2 97%   BMI 21.29 kg/m  Physical Exam Vitals and nursing note reviewed.  Constitutional:      General: He is not in acute distress.    Appearance: He is well-developed. He is not ill-appearing.  HENT:     Head: Normocephalic and atraumatic.     Right Ear: Tympanic membrane normal.     Left Ear: Tympanic membrane normal.     Ears:     Comments: Tympanic membranes normal bilaterally.  No tragus  or mastoid pain.     Mouth/Throat:     Pharynx: No oropharyngeal exudate.  Eyes:     Conjunctiva/sclera: Conjunctivae normal.     Pupils: Pupils are equal, round, and reactive to light.  Neck:     Comments: No meningismus.  Vesicular rash to left neck posterior to left ear as depicted. Cardiovascular:     Rate and Rhythm: Normal rate and regular rhythm.     Heart sounds: Normal heart sounds. No murmur heard. Pulmonary:     Effort: Pulmonary effort is normal. No respiratory distress.     Breath sounds: Normal breath sounds.  Abdominal:     Palpations: Abdomen is soft.     Tenderness: There is no abdominal tenderness. There is no guarding or rebound.  Musculoskeletal:        General: No tenderness. Normal range of motion.     Cervical back: Normal range of motion and neck supple.  Skin:    General: Skin is warm.  Neurological:     Mental Status: He is alert and oriented to person, place, and time.     Cranial Nerves: No cranial nerve deficit.     Motor: No abnormal muscle tone.     Coordination: Coordination normal.     Comments: No ataxia on finger to nose bilaterally. No pronator drift. 5/5 strength throughout. CN 2-12 intact.Equal grip strength. Sensation intact.  No facial droop or facial asymmetry  Psychiatric:        Behavior: Behavior normal.        ED Results / Procedures / Treatments   Labs (all labs ordered are listed, but only abnormal results are displayed) Labs Reviewed  CBC WITH DIFFERENTIAL/PLATELET - Abnormal; Notable for the following components:      Result Value   RBC 4.09 (*)    Hemoglobin 11.4 (*)    HCT 35.3 (*)    Eosinophils Absolute 0.6 (*)    All other components within normal limits  COMPREHENSIVE METABOLIC PANEL - Abnormal; Notable for the following components:   Potassium 5.3 (*)    Glucose, Bld 113 (*)    BUN 29 (*)    Creatinine, Ser 1.58 (*)    GFR, Estimated 42 (*)    All other components within normal limits  POTASSIUM     EKG EKG Interpretation  Date/Time:  Friday December 27 2021 22:34:11 EDT Ventricular Rate:  71 PR Interval:  189 QRS Duration: 83 QT Interval:  402 QTC Calculation: 437 R Axis:   27 Text Interpretation: Sinus rhythm Abnormal R-wave progression, early transition Baseline wander in lead(s) V1 No significant change was found Confirmed by Ezequiel Essex [phone]) on 12/27/2021 10:43:30 PM  Radiology No results found.  Procedures Procedures    Medications Ordered in ED Medications - No data to display  ED Course/ Medical Decision Making/ A&P                           Medical Decision Making Amount and/or Complexity of Data Reviewed Labs: ordered. ECG/medicine tests: ordered.  Risk Prescription drug management.   Rash to left neck behind left ear concerning for shingles.  Tympanic membrane is normal.  No facial droop.  Discussed with Dr. Janace Hoard of ENT.  Recommends treatment for suspected shingles if no cranial nerve involvement to suggest [place] syndrome currently.  Labs reassuring with mild hyperkalemia 5.3.  Chart review shows patient does have a history of this and takes Lokelma every other day as prescribed by PCP.  He follows with nephrology as well.  He did not take his Lokelma for the past several days due to this rash.  EKG shows no acute hyperkalemic changes.  Dose of Lokelma given here.  Creatinine is at baseline.  Potassium improved to 5.0. Patient to be discharged on prednisone as well as Valtrex.  Follow-up with PCP for recheck next week of his potassium and kidney function.  Monitor blood sugars closely while taking prednisone. Return to the ED with new or worsening symptoms.        Final Clinical Impression(s) / ED Diagnoses Final diagnoses:  Herpes zoster without complication  Hyperkalemia    Rx / DC Orders ED Discharge Orders     None         Alesandra Smart, Annie Main, MD 12/27/21 2313

## 2021-12-31 ENCOUNTER — Encounter: Payer: Self-pay | Admitting: Family

## 2021-12-31 ENCOUNTER — Ambulatory Visit (INDEPENDENT_AMBULATORY_CARE_PROVIDER_SITE_OTHER): Payer: Medicare Other | Admitting: Family

## 2021-12-31 VITALS — BP 120/72 | HR 87 | Temp 98.1°F | Resp 18 | Ht 69.0 in | Wt 145.6 lb

## 2021-12-31 DIAGNOSIS — B029 Zoster without complications: Secondary | ICD-10-CM | POA: Diagnosis not present

## 2021-12-31 NOTE — Progress Notes (Signed)
Marco [name] is a 88 y.o. male with the following history as recorded in Epic[employer]:  Patient Active Problem List   Diagnosis Date Noted   Prostate cancer ([place]) 05/29/2021   CKD (chronic kidney disease) stage 3, GFR 30-59 ml/min ([place]) 02/11/2021   CAD (coronary artery disease) 03/06/2020   Hypothyroidism 12/02/2017   Chronic pain of right knee 10/13/2017   Chest pain 11/19/2016   Amaurosis fugax 12/12/2015   PCP NOTES >>> 03/08/2015   Osteoporosis 09/15/2014   Primary osteoarthritis of both knees 03/16/2014   Hearing difficulty 10/20/2013   DJD (degenerative joint disease) 08/25/2013   Neck mass 04/25/2013   Hyperlipidemia 04/25/2013   Erectile dysfunction 12/26/2011   Annual physical exam 08/22/2011   Prostate nodule 08/22/2011   Diabetes mellitus ([place]) 05/07/2011    Current Outpatient Medications  Medication Sig Dispense Refill   Accu-Chek Softclix Lancets lancets CHECK BLOOD SUGAR ONCE  DAILY AS DIRECTED 100 each 12   Alcohol Swabs PADS Use to clean finger prior to testing blood sugars 100 each 1   alendronate (FOSAMAX) 70 MG tablet TAKE 1 TABLET(70 MG) BY MOUTH 1 TIME A WEEK WITH A FULL GLASS OF WATER AND ON AN EMPTY STOMACH 12 tablet 3   aspirin EC 81 MG EC tablet Take 1 tablet (81 mg total) by mouth daily.     atorvastatin (LIPITOR) 40 MG tablet Take 1 tablet (40 mg total) by mouth at bedtime. 100 tablet 1   Blood Glucose Calibration (ACCU-CHEK GUIDE CONTROL) LIQD Use as directed 1 each 3   Blood Glucose Monitoring Suppl (ACCU-CHEK GUIDE) w/Device KIT Check blood sugars once daily 1 kit 0   Calcium Carb-Cholecalciferol (CALCIUM 500 + D) 500-125 MG-UNIT TABS Take 1 tablet by mouth daily.     Cyanocobalamin (VITAMIN B-12 PO) Take 2,500 mcg by mouth daily.     ezetimibe (ZETIA) 10 MG tablet Take 1 tablet (10 mg total) by mouth daily. 100 tablet 1   glucose blood (ACCU-CHEK GUIDE) test strip USE TO CHECK BLOOD SUGAR  ONCE DAILY 100 strip 12   levothyroxine (SYNTHROID) 100  MCG tablet Take 1 tablet (100 mcg total) by mouth daily before breakfast. 100 tablet 1   metFORMIN (GLUCOPHAGE) 500 MG tablet Take 1 tablet (500 mg total) by mouth daily. *take with a meal* 100 tablet 1   Multiple Vitamin (MULTI-VITAMIN DAILY PO) Take 1 tablet by mouth daily.     nitroGLYCERIN (NITROSTAT) 0.4 MG SL tablet Place 1 tablet (0.4 mg total) under the tongue every 5 (five) minutes x 3 doses as needed for chest pain. 30 tablet 3   predniSONE (DELTASONE) 50 MG tablet 1 tablet PO daily 7 tablet 0   sitaGLIPtin (JANUVIA) 50 MG tablet Take 1 tablet (50 mg total) by mouth daily. 100 tablet 1   sodium zirconium cyclosilicate (LOKELMA) 10 g PACK packet Take 10 g by mouth daily. Or as directed by physician 90 each 0   valACYclovir (VALTREX) 1000 MG tablet Take 1 tablet (1,000 mg total) by mouth 2 (two) times daily. 14 tablet 0   XTANDI 40 MG tablet Take 120 mg by mouth daily.     No current facility-administered medications for this visit.    Allergies: Patient has no known allergies.  Past Medical History:  Diagnosis Date   Amaurosis fugax 2017   Diabetes mellitus    DJD (degenerative joint disease) 08/25/2013   Elevated PSA    Other and unspecified hyperlipidemia 04/25/2013   Prostate cancer ([place])  Past Surgical History:  Procedure Laterality Date   CATARACT EXTRACTION     Bilaterally   TYMPANIC MEMBRANE REPAIR     R    Family History  Problem Relation Age of Onset   Other Mother        Unknown    Other Father        Unknown   Diabetes Neg Hx    CAD Neg Hx    Cancer Neg Hx     Social History   Tobacco Use   Smoking status: Never   Smokeless tobacco: Never  Substance Use Topics   Alcohol use: No    Subjective:  ER follow up on 12/27/21; accompanied by son; was diagnosed with Herpes Zoster and responding well to treatment with Valtrex and Prednisone; denies any pain or itching at site of rash;     Objective:  Vitals:   12/31/21 1055  BP: 120/72  Pulse: 87   Resp: 18  Temp: 98.1 F (36.7 C)  TempSrc: Oral  SpO2: 97%  Weight: 145 lb 9.6 oz (66 kg)  Height: '5\' 9"'  (1.753 m)    General: Well developed, well nourished, in no acute distress  Skin : Warm and dry. Scabbed lesions c/w resolving zoster Head: Normocephalic and atraumatic  Eyes: Sclera and conjunctiva clear; pupils round and reactive to light; extraocular movements intact  Lungs: Respirations unlabored;  Vessels: Symmetric bilaterally  Neurologic: Alert and oriented; speech intact; face symmetrical; moves all extremities well; CNII-XII intact without focal deficit   Assessment:  1. Herpes zoster without complication     Plan:  Resolving with no concerns at this time; recommend to complete treatment; did encourage patient and son to notify urology about being on prednisone to see if planned urology treatment for tomorrow needs to be postponed; Follow up with PCP as needed otherwise.  No follow-ups on file.  No orders of the defined types were placed in this encounter.   Requested Prescriptions    No prescriptions requested or ordered in this encounter

## 2022-01-01 DIAGNOSIS — C7919 Secondary malignant neoplasm of other urinary organs: Secondary | ICD-10-CM | POA: Diagnosis not present

## 2022-01-01 DIAGNOSIS — N130 Hydronephrosis with ureteropelvic junction obstruction: Secondary | ICD-10-CM | POA: Diagnosis not present

## 2022-01-07 ENCOUNTER — Encounter: Payer: Self-pay | Admitting: Internal Medicine

## 2022-01-14 ENCOUNTER — Telehealth: Payer: Self-pay | Admitting: Internal Medicine

## 2022-01-14 NOTE — Telephone Encounter (Signed)
He had valacyclovir already.  No other antivirals are needed. He needs to let us know what issues is he having from shingles.  Is it pain?  Persistent rash?  Please let me know.  Schedule a virtual visit if needed.

## 2022-01-14 NOTE — Telephone Encounter (Signed)
Please advise 

## 2022-01-14 NOTE — Telephone Encounter (Signed)
Pt called stating he had finished his treatment for shingles but he is still having issues with them. Pt was wondering if there was any other meds Dr. Larose Kells could send in to help with this. Please Advise.

## 2022-01-15 MED ORDER — GABAPENTIN 100 MG PO CAPS: 100.0000 mg | ORAL_CAPSULE | Freq: Three times a day (TID) | ORAL | 0 refills | Status: DC

## 2022-01-15 NOTE — Telephone Encounter (Signed)
For the rash, no further treatment is needed. For pain, recommend Tylenol 500 mg 2 tablets twice daily. If he reports the pain is severe, then start gabapentin 100 mg 1 tablet 3 times a day.  #90 no refills.  Watch for somnolence.

## 2022-01-15 NOTE — Telephone Encounter (Signed)
Pt is very difficult to understand via telephone, mychart message sent for further information.

## 2022-01-15 NOTE — Telephone Encounter (Signed)
Pt called stating that the shingles are still present with various levels of pain. Pt stated that other than those two symptoms, he is not experiencing any other symptoms.

## 2022-01-15 NOTE — Telephone Encounter (Signed)
Spoke w/ Pt- informed of recommendations. He admits to severe pain at times. He agreed to try Tylenol and gabapentin, I made him aware that it can make him sleepy. Pt verbalized understanding.

## 2022-01-21 ENCOUNTER — Emergency Department (HOSPITAL_BASED_OUTPATIENT_CLINIC_OR_DEPARTMENT_OTHER)
Admission: EM | Admit: 2022-01-21 | Discharge: 2022-01-21 | Disposition: A | Discharge status: Home / Self Care | Payer: Medicare Other | Attending: Emergency Medicine | Admitting: Emergency Medicine

## 2022-01-21 ENCOUNTER — Other Ambulatory Visit (HOSPITAL_BASED_OUTPATIENT_CLINIC_OR_DEPARTMENT_OTHER): Payer: Self-pay

## 2022-01-21 ENCOUNTER — Other Ambulatory Visit: Payer: Self-pay

## 2022-01-21 ENCOUNTER — Encounter (HOSPITAL_BASED_OUTPATIENT_CLINIC_OR_DEPARTMENT_OTHER): Payer: Self-pay | Admitting: Emergency Medicine

## 2022-01-21 DIAGNOSIS — U071 COVID-19: Secondary | ICD-10-CM | POA: Diagnosis not present

## 2022-01-21 DIAGNOSIS — R9431 Abnormal electrocardiogram [ECG] [EKG]: Secondary | ICD-10-CM | POA: Diagnosis not present

## 2022-01-21 DIAGNOSIS — R531 Weakness: Secondary | ICD-10-CM | POA: Diagnosis present

## 2022-01-21 DIAGNOSIS — Z7982 Long term (current) use of aspirin: Secondary | ICD-10-CM | POA: Diagnosis not present

## 2022-01-21 LAB — RESP PANEL BY RT-PCR (FLU A&B, COVID) ARPGX2
Influenza A by PCR: NEGATIVE
Influenza B by PCR: NEGATIVE
SARS Coronavirus 2 by RT PCR: POSITIVE — AB

## 2022-01-21 MED ORDER — NIRMATRELVIR/RITONAVIR (PAXLOVID)TABLET
3.0000 | ORAL_TABLET | Freq: Two times a day (BID) | ORAL | 0 refills | Status: AC
  Filled 2022-01-21: qty 30, 5d supply, fill #0

## 2022-01-21 NOTE — ED Provider Notes (Signed)
[facility] EMERGENCY DEPARTMENT Provider Note   CSN: [id number] Arrival date & time: 01/21/22  1034     History  Chief Complaint  Patient presents with   Weakness   Covid Exposure    Marco [name] is a 88 y.o. male.  Patient is an 88 year old male presenting for nausea, generalized weakness, after COVID-19 exposure.  Patient states his family member had has been sick lately and was recently diagnosed with COVID-19 virus.  Denies any difficulty eating, abdominal pain, nausea, vomiting, or diarrhea.  Denies chest pain, shortness of breath, or coughing.  Denies fevers or chills.  The history is provided by the patient. No language interpreter was used.  Weakness Associated symptoms: no abdominal pain, no arthralgias, no chest pain, no cough, no dysuria, no fever, no seizures, no shortness of breath and no vomiting        Home Medications Prior to Admission medications   Medication Sig Start Date End Date Taking? Authorizing Provider  nirmatrelvir/ritonavir EUA (PAXLOVID) 20 x 150 MG & 10 x 100MG TABS Take 3 tablets by mouth 2 (two) times daily for 5 days. Patient GFR is 60. Take nirmatrelvir (150 mg) two tablets twice daily for 5 days and ritonavir (100 mg) one tablet twice daily for 5 days. 01/21/22 08/22/35 Yes Campbell Stall P, DO  Accu-Chek Softclix Lancets lancets CHECK BLOOD SUGAR ONCE  DAILY AS DIRECTED 10/29/21   Colon Branch, MD  Alcohol Swabs PADS Use to clean finger prior to testing blood sugars 03/13/21   Colon Branch, MD  alendronate (FOSAMAX) 70 MG tablet TAKE 1 TABLET(70 MG) BY MOUTH 1 TIME A WEEK WITH A FULL GLASS OF WATER AND ON AN EMPTY STOMACH Patient not taking: Reported on 01/22/2022 04/30/21   Colon Branch, MD  aspirin EC 81 MG EC tablet Take 1 tablet (81 mg total) by mouth daily. 11/22/16   Johnson, Clanford L, MD  atorvastatin (LIPITOR) 40 MG tablet Take 1 tablet (40 mg total) by mouth at bedtime. 10/30/21   Colon Branch, MD  Blood Glucose Calibration  (ACCU-CHEK GUIDE CONTROL) LIQD Use as directed 12/24/20   Colon Branch, MD  Blood Glucose Monitoring Suppl (ACCU-CHEK GUIDE) w/Device KIT Check blood sugars once daily 03/13/21   Colon Branch, MD  Calcium Carb-Cholecalciferol (CALCIUM 500 + D) 500-125 MG-UNIT TABS Take 1 tablet by mouth daily.    [provider]  Cyanocobalamin (VITAMIN B-12 PO) Take 2,500 mcg by mouth daily.    [provider]  ezetimibe (ZETIA) 10 MG tablet Take 1 tablet (10 mg total) by mouth daily. 10/30/21   Colon Branch, MD  gabapentin (NEURONTIN) 100 MG capsule Take 1 capsule (100 mg total) by mouth 3 (three) times daily. 01/15/22   Colon Branch, MD  glucose blood (ACCU-CHEK GUIDE) test strip USE TO CHECK BLOOD SUGAR  ONCE DAILY 10/29/21   Colon Branch, MD  levothyroxine (SYNTHROID) 100 MCG tablet Take 1 tablet (100 mcg total) by mouth daily before breakfast. 10/30/21   Colon Branch, MD  metFORMIN (GLUCOPHAGE) 500 MG tablet Take 1 tablet (500 mg total) by mouth daily. *take with a meal* 10/30/21   Colon Branch, MD  Multiple Vitamin (MULTI-VITAMIN DAILY PO) Take 1 tablet by mouth daily.    [provider]  nitroGLYCERIN (NITROSTAT) 0.4 MG SL tablet Place 1 tablet (0.4 mg total) under the tongue every 5 (five) minutes x 3 doses as needed for chest pain. Patient not taking:  Reported on 01/22/2022 05/14/21   Colon Branch, MD  sitaGLIPtin (JANUVIA) 50 MG tablet Take 1 tablet (50 mg total) by mouth daily. 10/30/21   Colon Branch, MD  sodium zirconium cyclosilicate (LOKELMA) 10 g PACK packet Take 10 g by mouth daily. Or as directed by physician Patient taking differently: Take 10 g by mouth every other day. Or as directed by physician 07/19/21   Colon Branch, MD  XTANDI 40 MG tablet Take 120 mg by mouth daily. 07/12/21   [provider]      Allergies    Patient has no known allergies.    Review of Systems   Review of Systems  Constitutional:  Negative for chills and fever.  HENT:  Negative for ear pain and sore  throat.   Eyes:  Negative for pain and visual disturbance.  Respiratory:  Negative for cough and shortness of breath.   Cardiovascular:  Negative for chest pain and palpitations.  Gastrointestinal:  Negative for abdominal pain and vomiting.  Genitourinary:  Negative for dysuria and hematuria.  Musculoskeletal:  Negative for arthralgias and back pain.  Skin:  Negative for color change and rash.  Neurological:  Positive for weakness and light-headedness. Negative for seizures and syncope.  All other systems reviewed and are negative.   Physical Exam Updated Vital Signs BP 119/70   Pulse 79   Temp 98.1 F (36.7 C)   Resp 17   Ht '5\' 9"'  (1.753 m)   Wt 66.2 kg   SpO2 100%   BMI 21.56 kg/m  Physical Exam Vitals and nursing note reviewed.  Constitutional:      General: He is not in acute distress.    Appearance: He is well-developed.  HENT:     Head: Normocephalic and atraumatic.     Nose: Nose normal.     Mouth/Throat:     Lips: Pink.     Mouth: Mucous membranes are moist.     Pharynx: Oropharynx is clear.  Eyes:     Conjunctiva/sclera: Conjunctivae normal.  Cardiovascular:     Rate and Rhythm: Normal rate and regular rhythm.     Heart sounds: No murmur heard. Pulmonary:     Effort: Pulmonary effort is normal. No respiratory distress.     Breath sounds: Normal breath sounds.  Abdominal:     Palpations: Abdomen is soft.     Tenderness: There is no abdominal tenderness.  Musculoskeletal:        General: No swelling.     Cervical back: Neck supple.  Skin:    General: Skin is warm and dry.     Capillary Refill: Capillary refill takes less than 2 seconds.  Neurological:     Mental Status: He is alert and oriented to person, place, and time.     GCS: GCS eye subscore is 4. GCS verbal subscore is 5. GCS motor subscore is 6.  Psychiatric:        Mood and Affect: Mood normal.     ED Results / Procedures / Treatments   Labs (all labs ordered are listed, but only abnormal  results are displayed) Labs Reviewed  RESP PANEL BY RT-PCR (FLU A&B, COVID) ARPGX2 - Abnormal; Notable for the following components:      Result Value   SARS Coronavirus 2 by RT PCR POSITIVE (*)    All other components within normal limits    EKG EKG Interpretation  Date/Time:  Tuesday January 21 2022 13:24:49 EDT Ventricular Rate:  76 PR  Interval:  169 QRS Duration: 89 QT Interval:  375 QTC Calculation: 422 R Axis:   5 Text Interpretation: Sinus rhythm Abnormal R-wave progression, early transition Left ventricular hypertrophy Confirmed by Noemi Chapel [phone]) on 01/22/2022 2:28:50 PM  Radiology No results found.  Procedures Procedures    Medications Ordered in ED Medications - No data to display  ED Course/ Medical Decision Making/ A&P                           Medical Decision Making  67:45 PM 88 year old male presenting for nausea, generalized weakness, after COVID-19 exposure.  Is alert and oriented x3, no acute distress, afebrile, stable vital signs.  Physical exam demonstrates equal bilateral breath sounds with no adventitious lung sounds.  No respiratory distress.  No hypoxia.  Abdomen is soft and nontender.  Patient states he is tolerating p.o. without any difficulty.  Patient is positive for COVID-19 virus. No s/s sepsis. Paxlovid sent to pharmacy.   Patient in no distress and overall condition improved here in the ED. Detailed discussions were had with the patient regarding current findings, and need for close f/u with PCP or on call doctor. The patient has been instructed to return immediately if the symptoms worsen in any way for re-evaluation. Patient verbalized understanding and is in agreement with current care plan. All questions answered prior to discharge.         Final Clinical Impression(s) / ED Diagnoses Final diagnoses:  COVID    Rx / DC Orders ED Discharge Orders          Ordered    nirmatrelvir/ritonavir EUA (PAXLOVID) 20 x 150 MG & 10  x 100MG TABS  2 times daily        01/21/22 1311              Lianne Cure, DO 93/96/88 1546

## 2022-01-21 NOTE — ED Triage Notes (Signed)
Nausea, weakness, dizziness.  Son has covid.

## 2022-01-21 NOTE — Discharge Instructions (Addendum)
REST, increase oral hydration, take antivirals sent to pharmacy. Return for worsening symptoms.

## 2022-01-22 ENCOUNTER — Ambulatory Visit: Payer: Medicare Other | Admitting: Pharmacist

## 2022-01-22 DIAGNOSIS — B029 Zoster without complications: Secondary | ICD-10-CM

## 2022-01-22 DIAGNOSIS — N1832 Chronic kidney disease, stage 3b: Secondary | ICD-10-CM

## 2022-01-22 IMAGING — US US EXTREM LOW VENOUS*R*
1 series · 14 of 24 positions shown · non-contrast
Comparison: None.

CLINICAL DATA: Right lower extremity edema

EXAM:
RIGHT LOWER EXTREMITY VENOUS DOPPLER ULTRASOUND
TECHNIQUE: Gray-scale sonography with compression, as well as color and duplex
ultrasound, were performed to evaluate the deep venous system(s)
from the level of the common femoral vein through the popliteal and
proximal calf veins.

[Series 1: us extrem low venous*right* · 14 of 30 slices shown]
[im 1/30]
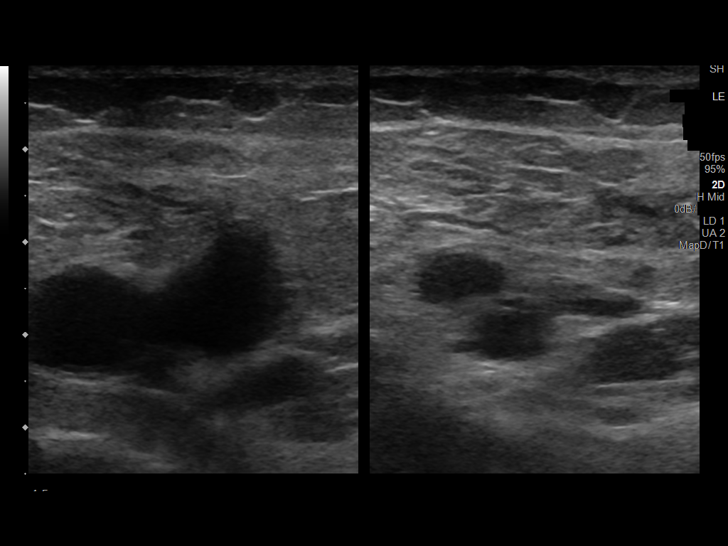
[im 3/30]
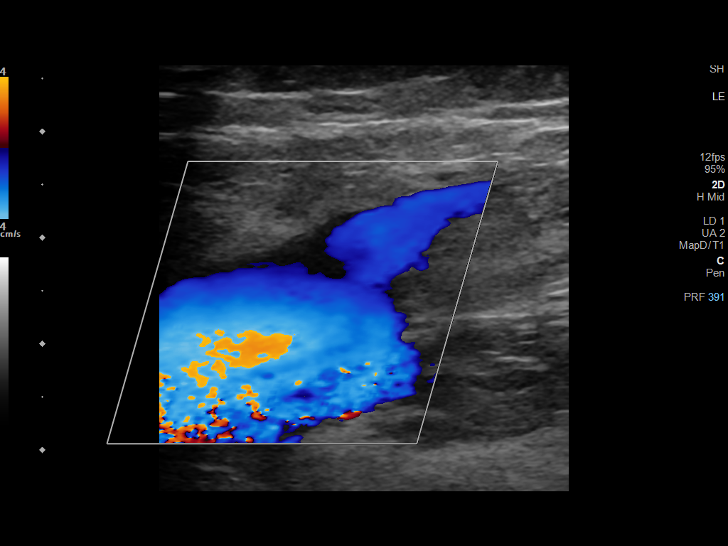
[im 6/30]
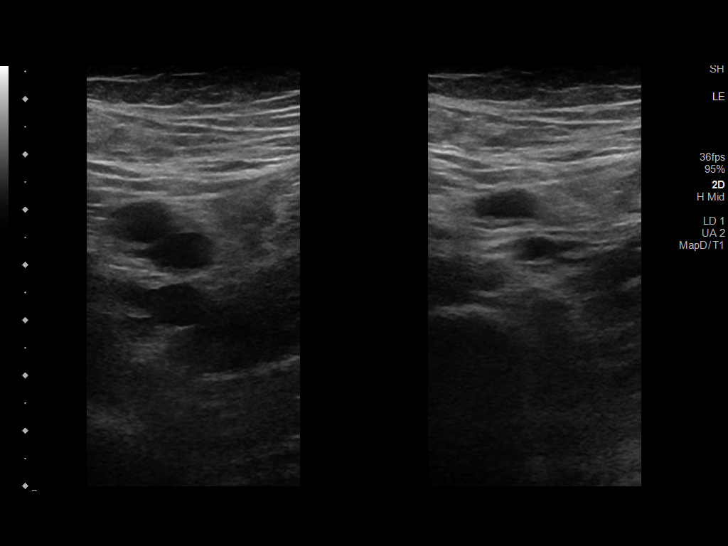
[im 8/30]
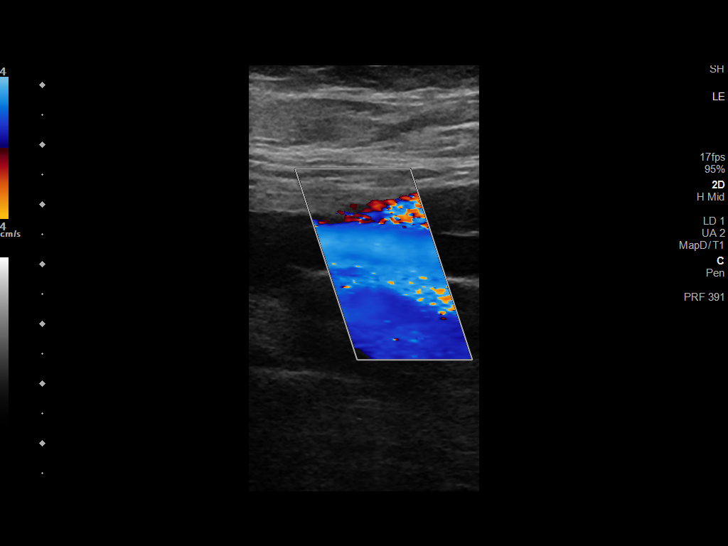
[im 9/30]
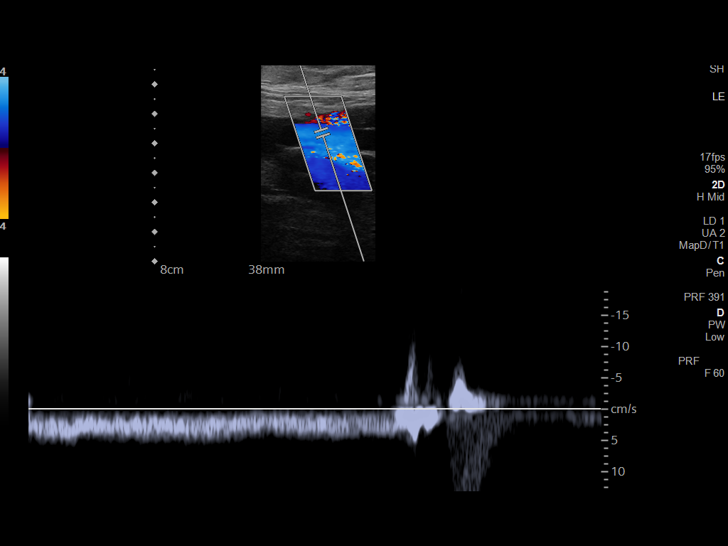
[im 12/30]
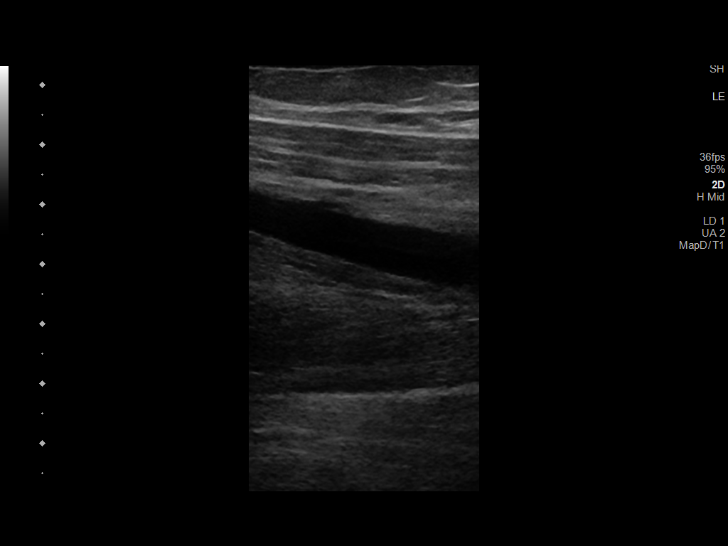
[im 14/30]
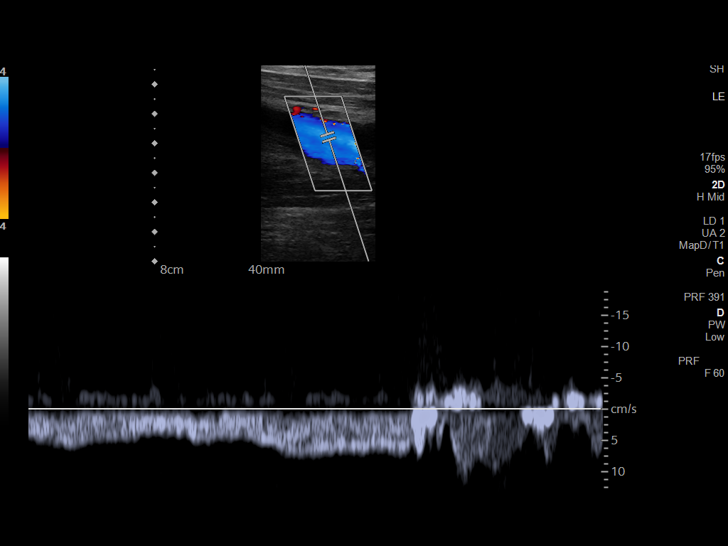
[im 16/30]
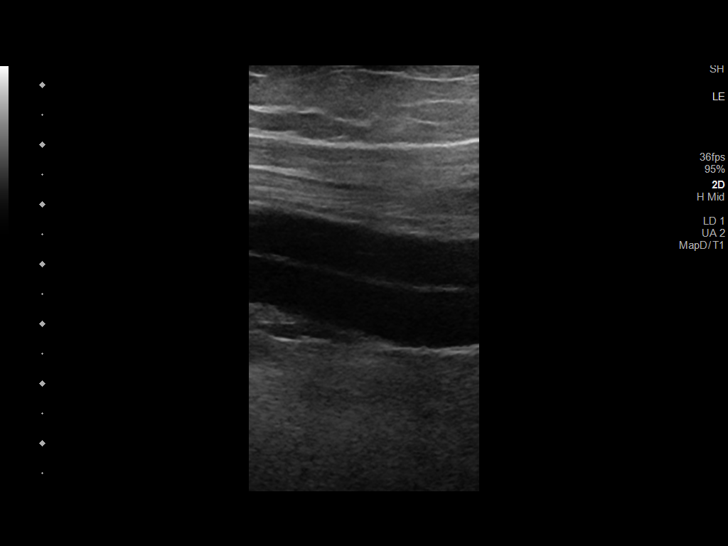
[im 18/30]
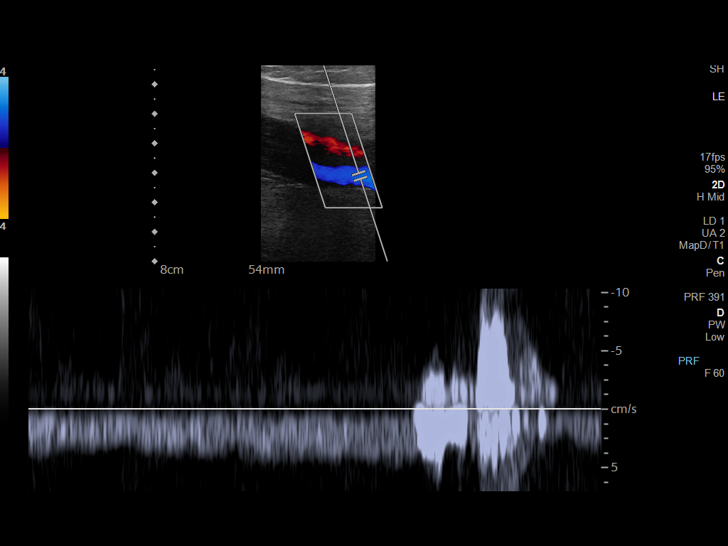
[im 21/30]
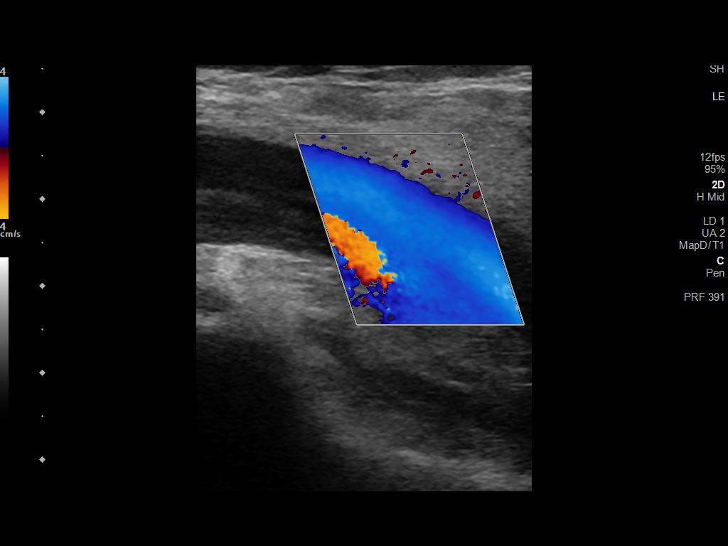
[im 23/30]
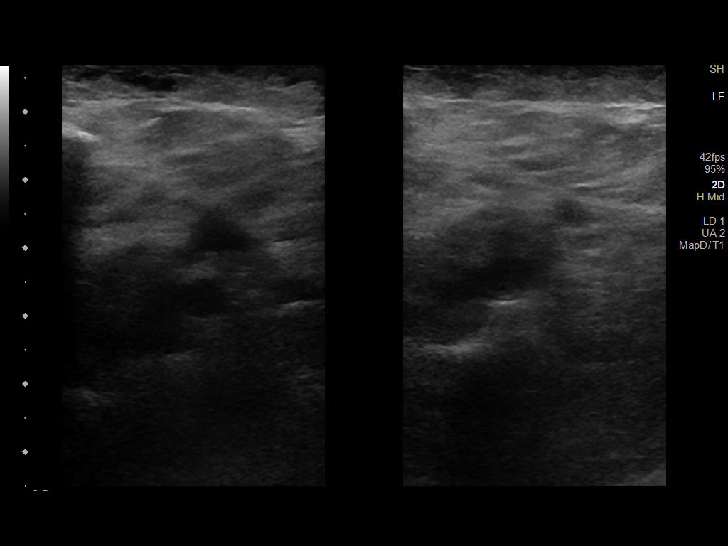
[im 24/30]
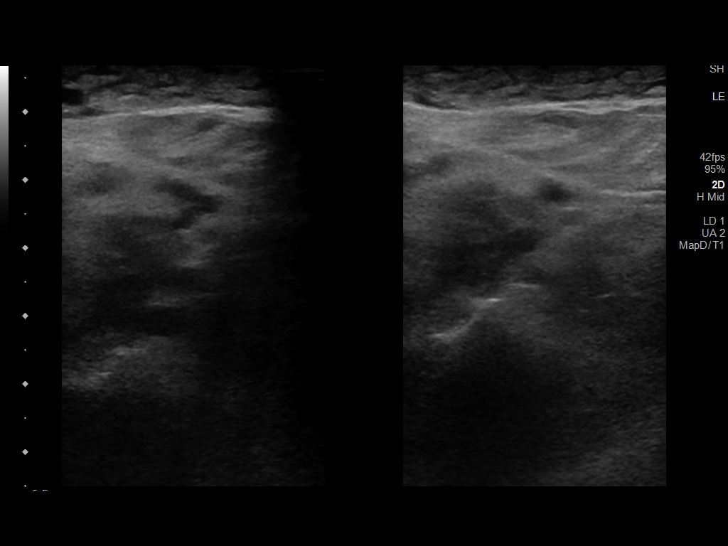
[im 27/30]
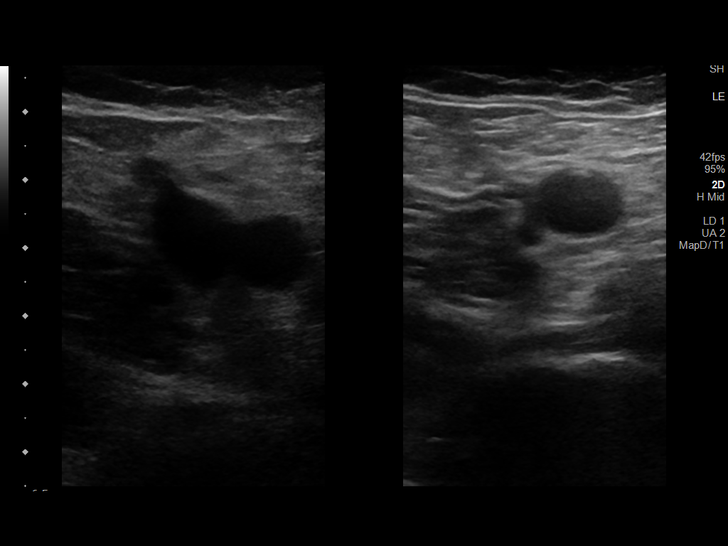
[im 30/30]
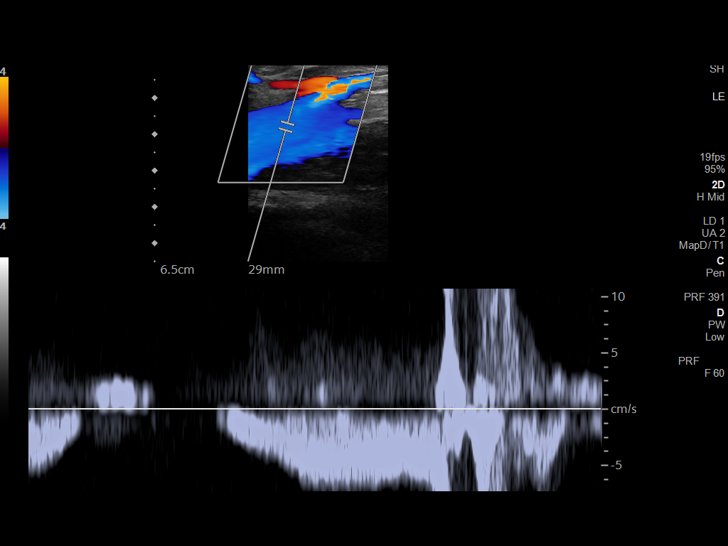

[14 of 24 positions shown; findings below may reference images not displayed]

FINDINGS: VENOUS

Normal compressibility of the common femoral, superficial femoral,
and popliteal veins, as well as the visualized calf veins.
Visualized portions of profunda femoral vein and great saphenous
vein unremarkable. No filling defects to suggest DVT on grayscale or
color Doppler imaging. Doppler waveforms show normal direction of
venous flow, normal respiratory plasticity and response to
augmentation.

Limited views of the contralateral common femoral vein are
unremarkable.

OTHER

None.

Limitations: none
IMPRESSION: Negative.

## 2022-01-22 NOTE — Patient Instructions (Signed)
Low potassium diet  What is potassium and why is it important to you? Potassium is a mineral found in many of the foods you eat. It plays a role in keeping your heartbeat regular and your muscles working right. It is the job of healthy kidneys to keep the right amount of potassium in your body. However, when your kidneys are not healthy, you often need to limit certain foods that can increase the potassium in your blood to a dangerous level. You may feel some weakness, numbness and tingling if your potassium is at a high level. If your potassium becomes too high, it can cause an irregular heartbeat or a heart attack. What is a safe level of potassium in my blood? Ask your doctor or dietitian about your monthly blood potassium level and enter it here: If it is 3.5-5.0........Marland KitchenYou are in the SAFE zone If it is 5.1-6.0........Marland KitchenYou are in the CAUTION zone If it is higher than 6.0.....Marland KitchenMarland KitchenYou are in the DANGER zone How can I keep my potassium level from getting too high? You should limit foods that are high in potassium. Your renal dietitian will help you plan your diet so you are getting the right amount of potassium. Eat a variety of foods but in moderation. If you want to include some high potassium vegetable in your diet, leach them before using. Leaching is a process by which some potassium can be pulled out of the vegetable. Instructions for leaching selected high potassium vegetables can be found at the end of this fact sheet. Check with your dietitian on the amount of leached high potassium vegetables that can be safely included in your diet. Do not drink or use the liquid from canned fruits and vegetables, or the juices from cooked meat. Remember that almost all foods have some potassium. The size of the serving is very important. A large amount of a low potassium food can turn into a high- potassium food. If you are on dialysis, be sure to get all the treatment or exchanges prescribed to  you.  What is a normal amount of potassium intake per day for the average healthy individual? A normal amount of potassium in a typical diet of a healthy American is about 3500 to 4500 milligrams per day. A potassium restricted diet is typically about 2000 milligrams per day. Your physician or dietitian will advise you as to the specific level of restriction you need based on your individual health. A kidney dietitian is trained to help you make modifications to you diet in order to prevent complications for kidney disease. High-Potassium Foods  Fruits Vegetables Other Foods  Apricot, raw (2 medium) dried (5 halves) Acorn Squash Bran/Bran products  Avocado ( whole) Artichoke Chocolate (1.5-2 ounces)  Banana ( whole) Bamboo Shoots Granola  Cantaloupe Baked Beans Milk, all types (1 cup)  Dates (5 whole) Butternut Squash Molasses (1 Tablespoon)  Dried fruits Refried Beans Nutritional Supplements: Use only under the direction of your doctor or dietitian.  Figs, dried Beets, fresh then boiled   Grapefruit Juice Black Beans   Honeydew Broccoli, cooked Nuts and Seeds (1 ounce)  Kiwi (1 medium) Brussels Sprouts Peanut Butter (2 tbs.)  Mango(1 medium) Chinese Cabbage Salt Substitutes/Lite Salt  Nectarine(1 medium) Carrots, raw Salt Free Broth  Orange(1 medium) Dried Beans and Peas Yogurt  Orange Juice Greens, except Kale Snuff/Chewing Tobacco  Papaya ( whole) Hubbard Squash    Pomegranate (1 whole) Kohlrabi    Pomegranate Juice Lentils    Prunes Legumes  Prune Juice White Mushrooms, cooked ( cup)    Raisins Okra      Parsnips      Potatoes, white and sweet      Pumpkin      Rutabagas      Spinach, cooked      Tomatoes/Tomato products      Vegetable Juices    What foods are low in potassium? The following table list foods which are low in potassium. A portion is  cup unless otherwise noted. Eating more than 1 portion can make a lower potassium food into a higher potassium  food. Low-Potassium Foods  Fruits Vegetables Other Foods  Apple (1 medium) Alfalfa sprouts Rice  Apple Juice Asparagus (6 spears raw) Noodles  Applesauce Beans, green or wax Broccoli (raw or cooked from frozen) Pasta  Apricots, canned in juice Cabbage, green and red Carrots, cooked Bread and bread products (Not Whole Grains)  Blackberries Cauliflower Cake: angel, yellow  Blueberries Celery (1 stalk) Coffee: limit to 8 ounces  Cherries Corn, fresh ( ear) frozen ( cup) Pies without chocolate or high potassium fruit  Cranberries Cucumber Cookies without nuts or chocolate  Fruit Cocktail Eggplant Tea: limit to 16 ounces  Grapes Kale    Grape Juice Lettuce    Grapefruit ( whole) Mixed Vegetables    Mandarin Oranges White Mushrooms, raw ( cup)    Peaches, fresh (1 small) canned ( cup) Onions    Pears, fresh (1 small) canned ( cup) Parsley    Pineapple Peas, green    Pineapple Juice Peppers    Plums (1 whole) Radish    Raspberries Rhubarb    Strawberries Water Chestnuts, canned     Tangerine (1 whole) Watercress    Watermelon (limit to 1 cup) Yellow Squash      Zucchini Squash     How do I get some of the potassium out of my favorite high-potassium vegetables? The process of leaching will help pull potassium out of some high-potassium vegetables. It is important to remember that leaching will not pull all of the potassium out of the vegetable. You must still limit the amount of leached high-potassium vegetables you eat. Ask your dietitian about the amount of leached vegetables that you can safely have in your diet. How to leach vegetables. For Potatoes, Sweet Potatoes, Carrots, Beets, Winter Squash, and [place]: [place] and place the vegetable in cold water so they won't darken. Slice vegetable 1/8 inch thick. Rinse in warm water for a few seconds. Soak for a minimum of two hours in warm water. Use ten times the amount of water to the amount of vegetables. If soaking longer,  change the water every four hours. Rinse under warm water again for a few seconds. Cook vegetable with five times the amount of water to the amount of vegetable.

## 2022-01-23 NOTE — Progress Notes (Signed)
Pharmacy Note  01/23/2022 Name: Marco [name] MRN: [id number] DOB: 28-Jun-1933  Subjective: Marco [name] is a 88 y.o. year old male who is a primary care patient of Colon Branch, [place] with patient by telephone for follow up visit in response to provider referral for medication management and adherence.  Patient will travel to [place] in November 2023 for about 25 days for his grandson's marriage.   CKD-3: eGFR / renal function continues to improve. Last eGFR was improved at 39 mL/min. Last visit with nephrologist was 12/06/2021 COVID infection: diagnosed 01/21/2022 - was started on Paxlovid 362m/100mg twice daily for 5 days. Patient report he is still weak but feeling better Shingles outbreak / rash on neck and behind ear - patient reports he completed Valtrex treatment. He is taking gabapentin 1046m3 times a day. Rash has clear but he is still experiencing itcing on neck where shingles rash was present. Prostate cancer: followed by urologist. Last visit 01/03/2022. Still taking Xtandi 12086maily for metastatic prostate cancer.  Medication management: reviewed refill history and med list with patient. Updated med list.     Patient Care Team: PazColon BranchD as PCP - General (Internal Medicine) TepRoel CluckD as Referring Physician (Ophthalmology) EckCherre RobinsPH-CPP (Pharmacist) GayJanith LimaD as Consulting Physician (Urology) SinGean QuintD as Consulting Physician (Nephrology)   Recent office visits: 01/14/2022 - Int Med - Phone called. Still having pain related to shingles. Prescribed gababpentin 100m59mtimes a day. Also recommended Tylenol 500mg53make 2 tablets twice a day.  12/31/2021 - Int Med (MurrValere Dross F/U herpes zoster / ED visit. Noted rash resolving. Recommended complete treatment started in ED. No med changes noted.  12/13/2021 - Int Med (Dr Paz) Larose Kellslow up chronic conditions. Labs checked. eGFR was 39 mL/min. No med changes noted.   09/04/2021 - PCP office - phone call. patient called and reported headaches that he thought was related to levothyroxine dose and wanted to decrease. Last TSH was WNL - Dr Paz rLarose Kellsmmended no change and for patient to make appointment for evaluation. Patient declined appt on 09/10/2021 stating headaches were better.  08/13/2021 - Int Med (Dr Paz) Larose Kellslow up chronic conditions. Labs checked. Scr stable and improved. Potassium was elevated and patient was instructed to take Lokelma twice a day for 3 days, then resume daily thereafter.     Recent consult visits:  01/03/2022 - Urology- (Dr Gay) Abner Greenspan metastatic prostate cancer. Current therapy Xtandi daily. Will get next Eligard dose 06/2019. F/U 6 months.  12/06/2021 - Nephrology (Dr SinghCandiss Norse CKD3. Labs checked. Noted Scr was stable. Potassium elevated. Patient was only taking Lokelma QOD - low potassium diet discussed. Unclear if Lokelma dose was adjusted.  10/01/2021 - Urology (Dr Gay) Abner Greenspan prostate cancer. Continue Xtandi. Check PSA.  09/09/2021 - Nephrology (Dr SinghCandiss Norse CKD, renal cyst, hydronephrosis and metastatic prostate cancer. No med changes noted. Checked labs.     Hospital visits: 01/21/2022 - ED Visit. COVID test positive. Prescribed Paxlovid - take 3 tablets twice a day for 5 days.  12/27/2021 - ED VIsit for herpes zoster on left neck and ear and hyperkalemia. [facility] last few days. Prescribed prednisone and Valtrex.   Objective:  Lab Results  Component Value Date   CREATININE 1.58 (H) 12/27/2021   CREATININE 1.59 (H) 12/13/2021   CREATININE 1.6 (A) 12/06/2021    Lab Results  Component Value Date  HGBA1C 7.2 (H) 12/13/2021       Component Value Date/Time   CHOL 132 08/13/2021 0956   TRIG 91.0 08/13/2021 0956   HDL 61.00 08/13/2021 0956   CHOLHDL 2 08/13/2021 0956   VLDL 18.2 08/13/2021 0956   LDLCALC 53 08/13/2021 0956     BP Readings from Last 3 Encounters:   01/21/22 119/70  12/31/21 120/72  12/27/21 (!) 164/97    No Known Allergies  Medications Reviewed Today     Reviewed by [name], RPH-CPP (Pharmacist) on 01/22/22 at 1057  Med List Status: <None>   Medication Order Taking? Sig Documenting Provider Last Dose Status Informant  Accu-Chek Softclix Lancets lancets [id number]  CHECK BLOOD SUGAR ONCE  DAILY AS DIRECTED Colon Branch, MD  Active   Alcohol Swabs PADS [id number]  Use to clean finger prior to testing blood sugars Colon Branch, MD  Active   alendronate (FOSAMAX) 70 MG tablet [id number]  TAKE 1 TABLET(70 MG) BY MOUTH 1 TIME A WEEK WITH A FULL GLASS OF WATER AND ON AN EMPTY STOMACH Colon Branch, MD  Active            Med Note [facility] [place] B   Fri Jul 19, 2021 12:02 PM) On hold due to low eGFR / creatinine clearance.   aspirin EC 81 MG EC tablet [id number] Yes Take 1 tablet (81 mg total) by mouth daily. Murlean Iba, MD Taking Active   atorvastatin (LIPITOR) 40 MG tablet [id number] Yes Take 1 tablet (40 mg total) by mouth at bedtime. Colon Branch, MD Taking Active   Blood Glucose Calibration (ACCU-CHEK GUIDE CONTROL) Yehuda Budd [id number] Yes Use as directed Colon Branch, MD Taking Active   Blood Glucose Monitoring Suppl (ACCU-CHEK GUIDE) w/Device Drucie Opitz [id number] Yes Check blood sugars once daily Colon Branch, MD Taking Active   Calcium Carb-Cholecalciferol (CALCIUM 500 + D) 500-125 MG-UNIT TABS [id number] Yes Take 1 tablet by mouth daily. [provider] Taking Active   Cyanocobalamin (VITAMIN B-12 PO) [id number] Yes Take 2,500 mcg by mouth daily. [provider] Taking Active   ezetimibe (ZETIA) 10 MG tablet [id number] Yes Take 1 tablet (10 mg total) by mouth daily. Colon Branch, MD Taking Active   gabapentin (NEURONTIN) 100 MG capsule [id number] Yes Take 1 capsule (100 mg total) by mouth 3 (three) times daily. Colon Branch, MD Taking Active   glucose blood (ACCU-CHEK GUIDE) test strip [id number] Yes USE TO CHECK BLOOD SUGAR  ONCE  DAILY Colon Branch, MD Taking Active   levothyroxine (SYNTHROID) 100 MCG tablet [id number] Yes Take 1 tablet (100 mcg total) by mouth daily before breakfast. Colon Branch, MD Taking Active   metFORMIN (GLUCOPHAGE) 500 MG tablet [id number] Yes Take 1 tablet (500 mg total) by mouth daily. *take with a mealLarose Kells, Alda Berthold, MD Taking Active   Multiple Vitamin (MULTI-VITAMIN DAILY PO) [id number]  Take 1 tablet by mouth daily. [provider]  Active   nirmatrelvir/ritonavir EUA (PAXLOVID) 20 x 150 MG & 10 x 100MG TABS [id number] Yes Take 3 tablets by mouth 2 (two) times daily for 5 days. Patient GFR is 60. Take nirmatrelvir (150 mg) two tablets twice daily for 5 days and ritonavir (100 mg) one tablet twice daily for 5 days. Lianne Cure, DO Taking Active   nitroGLYCERIN (NITROSTAT) 0.4 MG SL tablet [id number] No Place 1 tablet (0.4 mg total) under the tongue every 5 (five) minutes x 3 doses as needed for chest pain.  Patient not taking: Reported on 01/22/2022   Colon Branch, MD Not Taking Active            Med Note (CANTER, [facility] D   Tue Aug 13, 2021  9:05 AM) PRN  sitaGLIPtin (JANUVIA) 50 MG tablet [id number] Yes Take 1 tablet (50 mg total) by mouth daily. Colon Branch, MD Taking Active   sodium zirconium cyclosilicate (LOKELMA) 10 g PACK packet [id number] Yes Take 10 g by mouth daily. Or as directed by physician  Patient taking differently: Take 10 g by mouth every other day. Or as directed by physician   Colon Branch, MD Taking Active   XTANDI 40 MG tablet [id number] Yes Take 120 mg by mouth daily. [provider] Taking Active             Patient Active Problem List   Diagnosis Date Noted   Prostate cancer ([place]) 05/29/2021   CKD (chronic kidney disease) stage 3, GFR 30-59 ml/min ([place]) 02/11/2021   CAD (coronary artery disease) 03/06/2020   Hypothyroidism 12/02/2017   Chronic pain of right knee 10/13/2017   Chest pain 11/19/2016   Amaurosis fugax 12/12/2015   PCP NOTES >>>  03/08/2015   Osteoporosis 09/15/2014   Primary osteoarthritis of both knees 03/16/2014   Hearing difficulty 10/20/2013   DJD (degenerative joint disease) 08/25/2013   Neck mass 04/25/2013   Hyperlipidemia 04/25/2013   Erectile dysfunction 12/26/2011   Annual physical exam 08/22/2011   Prostate nodule 08/22/2011   Diabetes mellitus ([place]) 05/07/2011    Medication Assistance:  None required.  Patient affirms current coverage meets needs.  Assessment:  CKD stable Shingles - continues to have itching at site but no rash or pain COVID infection - symptoms improving  Plan:  Adjusted dose of Paxlovid to 147m (1 tablet of nirmatrelvir) twice a day based on eGFR of 39. Patient is instructed to remove 1 nirmetrelvir tablet from package with each dose.  Patient repeated back instruction and understands.  Will check with PCP about recommendations for itching at site of shinlges rash Coordinated with mail order pharmacy [facility] at patient request for refills. Patient will need enough to last thru his trip in November.     F/U planned in 3 months unless needed sooner.    TCherre Robins PharmD Clinical Pharmacist [facility]New [facility]

## 2022-01-31 IMAGING — US US RENAL
1 series · 14 of 25 positions shown · non-contrast
Comparison: None.

CLINICAL DATA: Chronic kidney disease, hyperkalemia.

EXAM:
RENAL / URINARY TRACT ULTRASOUND COMPLETE

[Series 1: us renal · 14 of 46 slices shown]
[im 1/46]
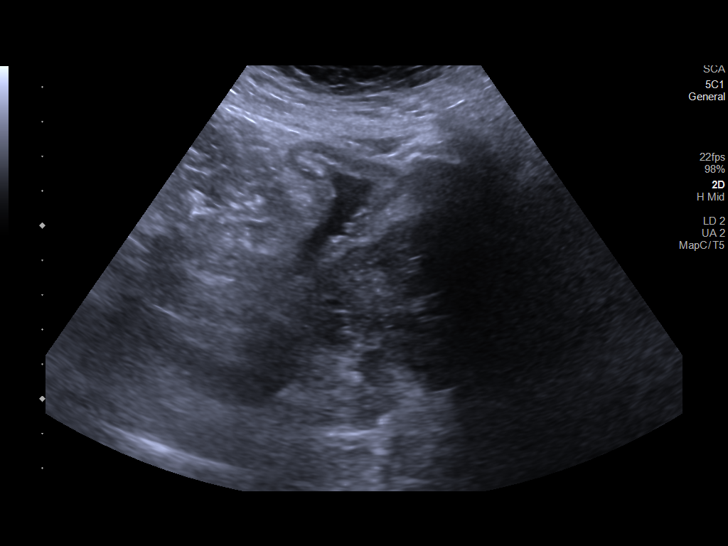
[im 4/46]
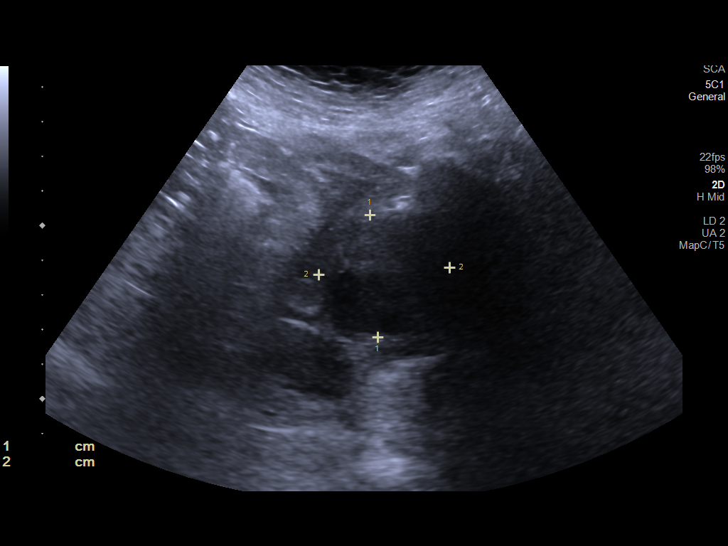
[im 8/46]
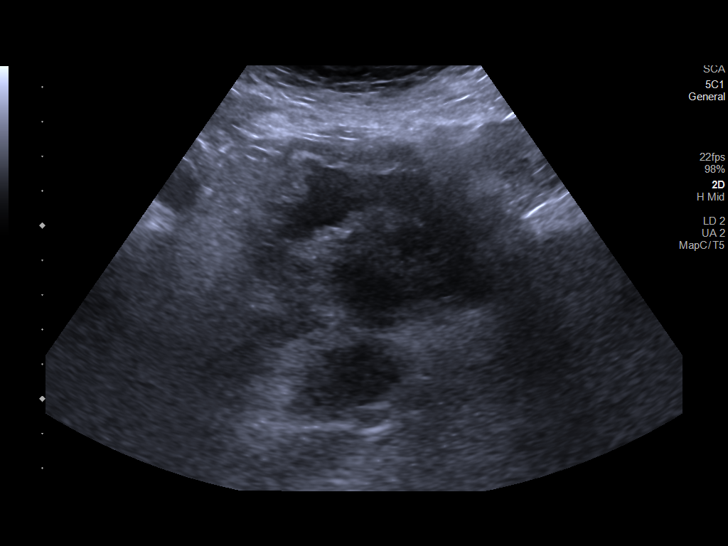
[im 12/46]
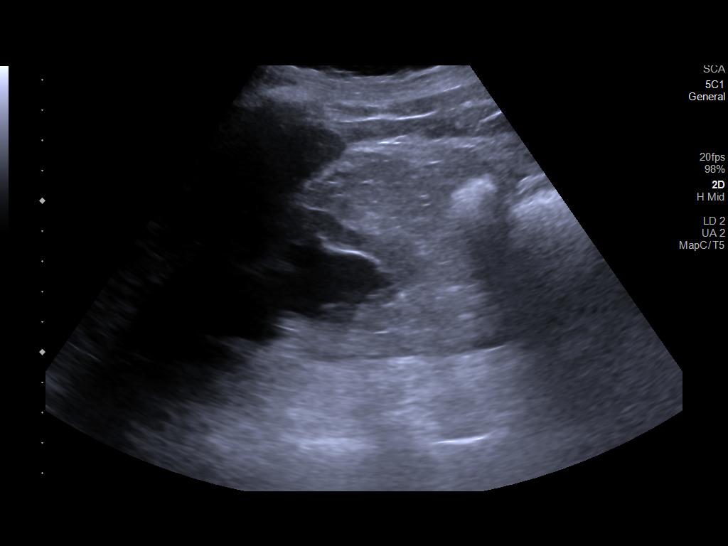
[im 16/46]
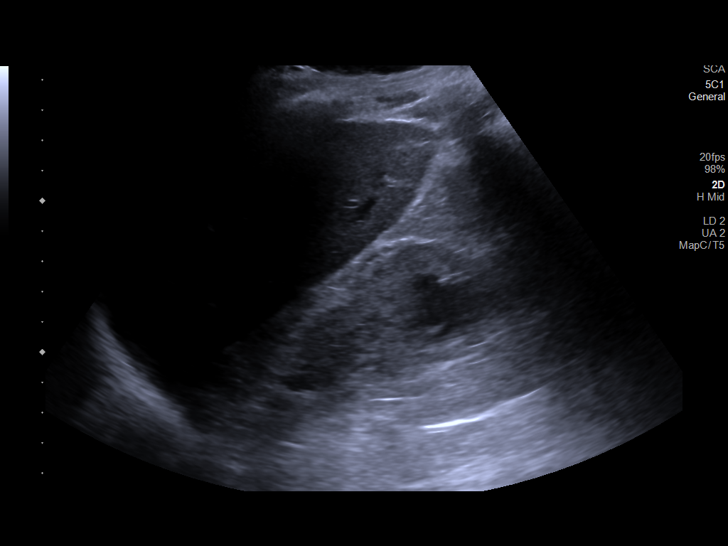
[im 17/46]
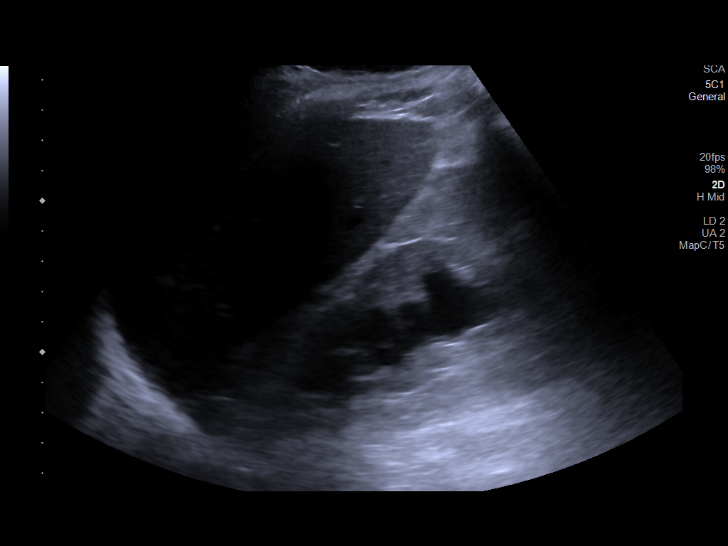
[im 21/46]
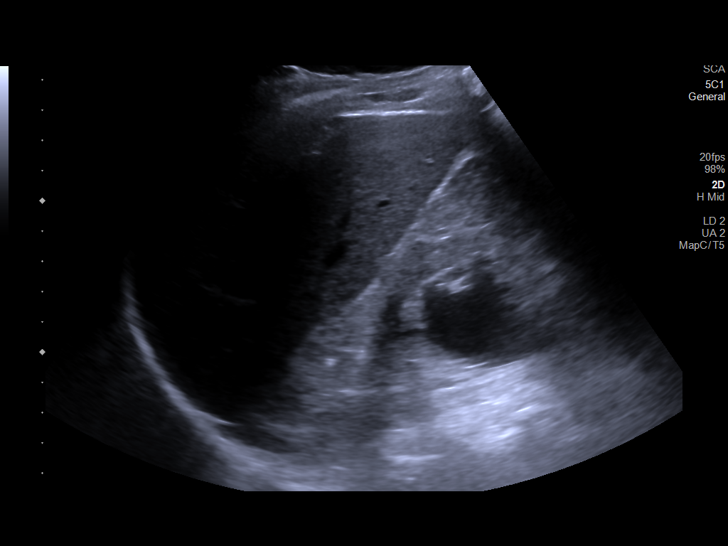
[im 25/46]
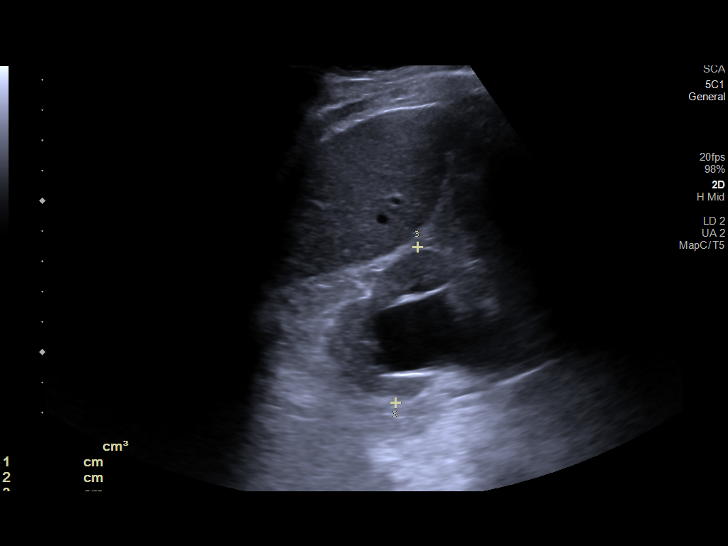
[im 29/46]
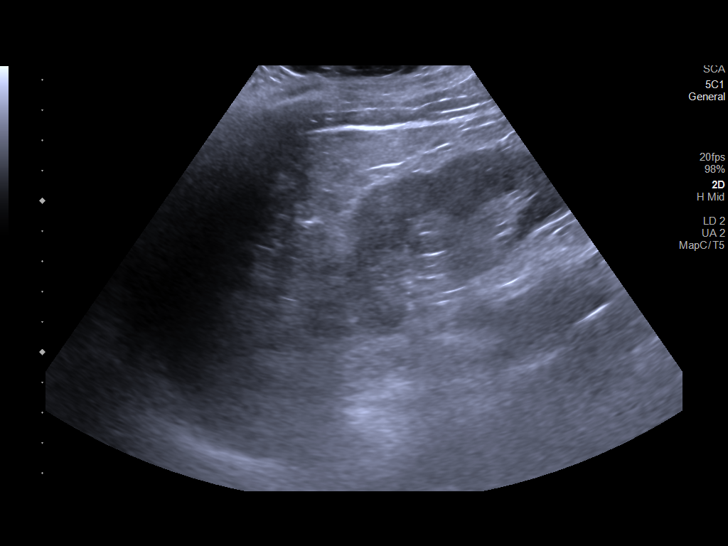
[im 31/46]
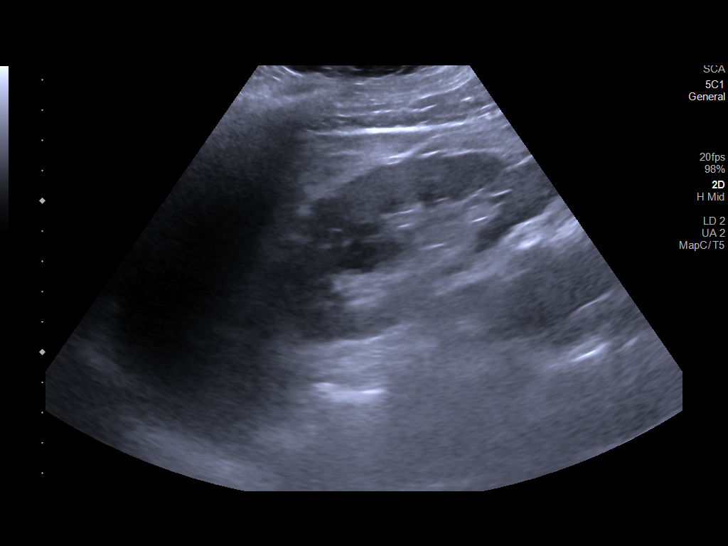
[im 34/46]
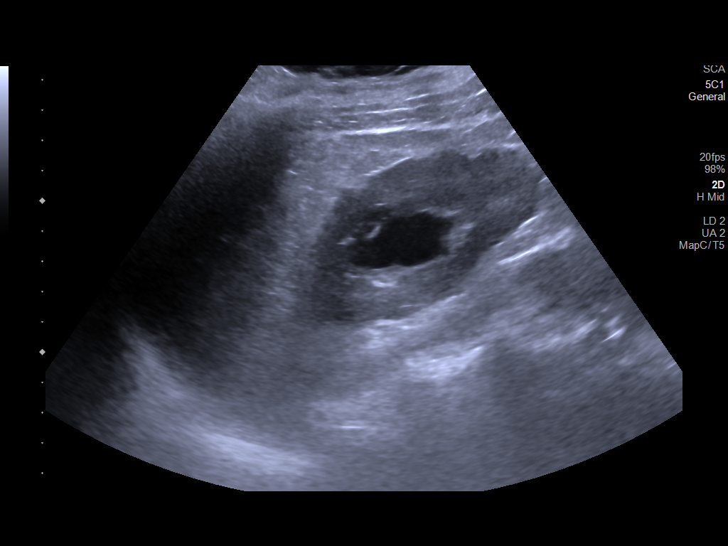
[im 38/46]
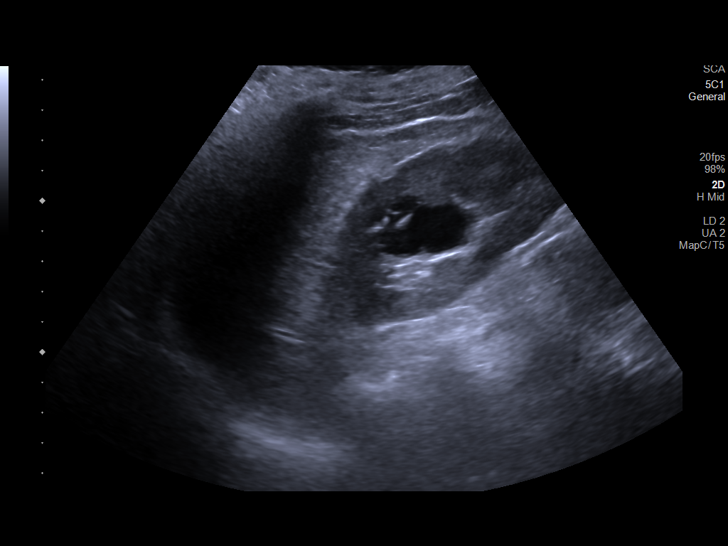
[im 42/46]
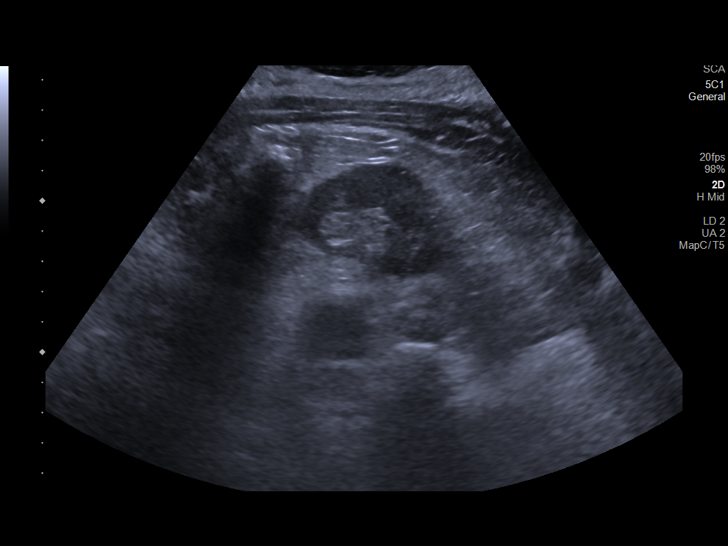
[im 46/46]
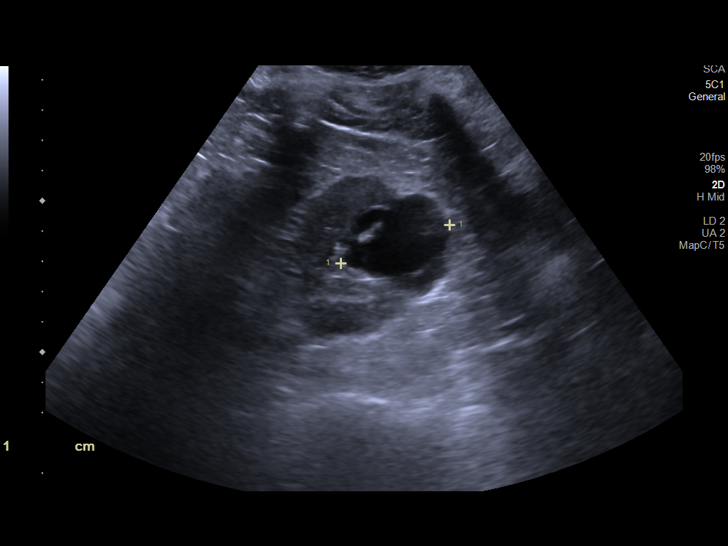

[14 of 25 positions shown; findings below may reference images not displayed]

FINDINGS: Right Kidney:

Renal measurements: 9.5 x 4.6 x 5.2 cm = volume: 120 mL.
Echogenicity within normal limits. There is severe right-sided
hydronephrosis. No focal mass identified.

Left Kidney:

Renal measurements: 10.3 x 4.5 x 5.4 cm = volume: 130 mL.
Echogenicity within normal limits. There is no hydronephrosis. There
is a 3.5 x 3.8 x 3.8 cm cystic area containing echogenic septations.

Bladder:

Under distended and not well evaluated.

Other:

Prostate mildly prominent size.
IMPRESSION: 1. Severe right-sided hydronephrosis.
2. Complex cystic area in the left kidney, indeterminate. Recommend
further evaluation with MRI.

## 2022-02-02 ENCOUNTER — Emergency Department (HOSPITAL_COMMUNITY): Payer: Medicare Other

## 2022-02-02 ENCOUNTER — Encounter (HOSPITAL_COMMUNITY): Payer: Self-pay | Admitting: Radiology

## 2022-02-02 ENCOUNTER — Emergency Department (HOSPITAL_COMMUNITY)
Admission: EM | Admit: 2022-02-02 | Discharge: 2022-02-02 | Disposition: A | Discharge status: Home / Self Care | Payer: Medicare Other | Attending: Emergency Medicine | Admitting: Emergency Medicine

## 2022-02-02 ENCOUNTER — Other Ambulatory Visit: Payer: Self-pay

## 2022-02-02 DIAGNOSIS — Z8616 Personal history of COVID-19: Secondary | ICD-10-CM | POA: Insufficient documentation

## 2022-02-02 DIAGNOSIS — R0689 Other abnormalities of breathing: Secondary | ICD-10-CM | POA: Insufficient documentation

## 2022-02-02 DIAGNOSIS — Z743 Need for continuous supervision: Secondary | ICD-10-CM | POA: Diagnosis not present

## 2022-02-02 DIAGNOSIS — R509 Fever, unspecified: Secondary | ICD-10-CM | POA: Diagnosis not present

## 2022-02-02 DIAGNOSIS — N179 Acute kidney failure, unspecified: Secondary | ICD-10-CM

## 2022-02-02 DIAGNOSIS — R5383 Other fatigue: Secondary | ICD-10-CM | POA: Diagnosis not present

## 2022-02-02 DIAGNOSIS — R6889 Other general symptoms and signs: Secondary | ICD-10-CM | POA: Diagnosis not present

## 2022-02-02 DIAGNOSIS — G9389 Other specified disorders of brain: Secondary | ICD-10-CM | POA: Diagnosis not present

## 2022-02-02 DIAGNOSIS — R404 Transient alteration of awareness: Secondary | ICD-10-CM | POA: Diagnosis not present

## 2022-02-02 DIAGNOSIS — S0990XA Unspecified injury of head, initial encounter: Secondary | ICD-10-CM | POA: Diagnosis not present

## 2022-02-02 DIAGNOSIS — R55 Syncope and collapse: Secondary | ICD-10-CM | POA: Diagnosis not present

## 2022-02-02 DIAGNOSIS — W19XXXA Unspecified fall, initial encounter: Secondary | ICD-10-CM | POA: Diagnosis not present

## 2022-02-02 LAB — BASIC METABOLIC PANEL
Anion gap: 11 (ref 5–15)
BUN: 40 mg/dL — ABNORMAL HIGH (ref 8–23)
CO2: 24 mmol/L (ref 22–32)
Calcium: 9.6 mg/dL (ref 8.9–10.3)
Chloride: 101 mmol/L (ref 98–111)
Creatinine, Ser: 1.77 mg/dL — ABNORMAL HIGH (ref 0.61–1.24)
GFR, Estimated: 37 mL/min — ABNORMAL LOW (ref >60)
Glucose, Bld: 128 mg/dL — ABNORMAL HIGH (ref 70–99)
Potassium: 5.4 mmol/L — ABNORMAL HIGH (ref 3.5–5.1)
Sodium: 136 mmol/L (ref 135–145)

## 2022-02-02 LAB — CBC WITH DIFFERENTIAL/PLATELET
Abs Immature Granulocytes: 0.11 10*3/uL — ABNORMAL HIGH (ref 0.00–0.07)
Basophils Absolute: 0 10*3/uL (ref 0.0–0.1)
Basophils Relative: 0 %
Eosinophils Absolute: 0.3 10*3/uL (ref 0.0–0.5)
Eosinophils Relative: 4 %
HCT: 33 % — ABNORMAL LOW (ref 39.0–52.0)
Hemoglobin: 10.5 g/dL — ABNORMAL LOW (ref 13.0–17.0)
Immature Granulocytes: 2 %
Lymphocytes Relative: 16 %
Lymphs Abs: 1.2 10*3/uL (ref 0.7–4.0)
MCH: 28.5 pg (ref 26.0–34.0)
MCHC: 31.8 g/dL (ref 30.0–36.0)
MCV: 89.4 fL (ref 80.0–100.0)
Monocytes Absolute: 0.5 10*3/uL (ref 0.1–1.0)
Monocytes Relative: 7 %
Neutro Abs: 5.2 10*3/uL (ref 1.7–7.7)
Neutrophils Relative %: 71 %
Platelets: 253 10*3/uL (ref 150–400)
RBC: 3.69 MIL/uL — ABNORMAL LOW (ref 4.22–5.81)
RDW: 15.3 % (ref 11.5–15.5)
WBC: 7.3 10*3/uL (ref 4.0–10.5)
nRBC: 0 % (ref 0.0–0.2)

## 2022-02-02 LAB — BRAIN NATRIURETIC PEPTIDE: B Natriuretic Peptide: 24.8 pg/mL (ref 0.0–100.0)

## 2022-02-02 LAB — TROPONIN I (HIGH SENSITIVITY): Troponin I (High Sensitivity): 7 ng/L (ref <18)

## 2022-02-02 MED ORDER — LACTATED RINGERS IV BOLUS
1000.0000 mL | Freq: Once | INTRAVENOUS | Status: AC
  Administered 2022-02-02: 1000 mL via INTRAVENOUS

## 2022-02-02 NOTE — Discharge Instructions (Addendum)
Marco [name] was seen today for an episode of syncope.  It appears to be likely from dehydration in the setting of his chronic kidney disease and dehydration.  We have can look for cardiac etiology and he has no evidence for cardiac etiology of his syncope at this time.  However he does have significant risk for cardiac disease at baseline.  Patient follows with his primary care provider closely for reassessment, plenty of fluids, rest and likely will need to use some kind of ambulatory support device including a walker.  Most importantly, be sure to follow-up with primary care within 48 hours for reassessment to ensure ongoing improvement.  Thank you for the opportunity to participate in your care, Tretha Sciara MD

## 2022-02-02 NOTE — ED Triage Notes (Signed)
Pt diagnosed with covid last week as well as shingles. Unsure when that was diagnosed. Pt daughter said she heard a "thud" and went and found the patient on the floor. Pt didn't hit his head he thinks. Pt denies pain. Pt states that he sat down that he did not fall.

## 2022-02-02 NOTE — ED Provider Notes (Signed)
[facility] DEPT Provider Note   CSN: [id number] Arrival date & time: 02/02/22  1512     History Chief Complaint  Patient presents with   Loss of Consciousness    HPI Marco [name] is a 88 y.o. male presenting for chief complaint of syncopal episode.  Patient was at home with his family member when he was walking from the fridge to the table and family heard a thump.  He was lying flat on his back.  Patient does not remember what happened.  Per family he was confused.  He was hypotensive when EMS arrived however reposted responsive to IV fluids.  He was initially bradycardic but this also spontaneously improved.  Patient now has no complaints.  He denies fevers or chills, nausea or vomiting, syncope shortness of breath.  Per patient's daughter, he did have an episode of subjective fevers this morning.  Otherwise no known sick contacts.  Patient was diagnosed with COVID 11 days ago but has completely returned to baseline per family.   Patient's recorded medical, surgical, social, medication list and allergies were reviewed in the Snapshot window as part of the initial history.   Review of Systems   Review of Systems  Constitutional:  Negative for chills and fever.  HENT:  Negative for ear pain and sore throat.   Eyes:  Negative for pain and visual disturbance.  Respiratory:  Negative for cough and shortness of breath.   Cardiovascular:  Negative for chest pain and palpitations.  Gastrointestinal:  Negative for abdominal pain and vomiting.  Genitourinary:  Negative for dysuria and hematuria.  Musculoskeletal:  Negative for arthralgias and back pain.  Skin:  Negative for color change and rash.  Neurological:  Positive for syncope. Negative for seizures.  All other systems reviewed and are negative.   Physical Exam Updated Vital Signs BP 126/72 (BP Location: Left Arm)   Pulse 87   Temp (!) 96.6 F (35.9 C) (Rectal)   Resp 16   Ht '5\' 8"'$  (1.727 m)    Wt 67.1 kg   SpO2 100%   BMI 22.50 kg/m  Physical Exam Vitals and nursing note reviewed.  Constitutional:      General: He is not in acute distress.    Appearance: He is well-developed.  HENT:     Head: Normocephalic and atraumatic.  Eyes:     Conjunctiva/sclera: Conjunctivae normal.  Cardiovascular:     Rate and Rhythm: Normal rate and regular rhythm.     Heart sounds: No murmur heard. Pulmonary:     Effort: Pulmonary effort is normal. No respiratory distress.     Breath sounds: Normal breath sounds.  Abdominal:     Palpations: Abdomen is soft.     Tenderness: There is no abdominal tenderness.  Musculoskeletal:        General: No swelling.     Cervical back: Neck supple.  Skin:    General: Skin is warm and dry.     Capillary Refill: Capillary refill takes less than 2 seconds.  Neurological:     Mental Status: He is alert.  Psychiatric:        Mood and Affect: Mood normal.      ED Course/ Medical Decision Making/ A&P    Procedures Procedures   Medications Ordered in ED Medications  lactated ringers bolus 1,000 mL (1,000 mLs Intravenous New Bag/Given 02/02/22 1723)    Medical Decision Making:    Marco [name] is a 88 y.o. male who presented to  the ED today with syncopal episode detailed above.     Patient's presentation is complicated by their history of advanced age.  Patient placed on continuous vitals and telemetry monitoring while in ED which was reviewed periodically.   Complete initial physical exam performed, notably the patient  was hemodynamically stable in no acute distress.  Patient with no complaints at this time.  Family has more concerns including multiple mechanical falls over the past few years.      Reviewed and confirmed nursing documentation for past medical history, family history, social history.    Initial Assessment:   With the patient's presentation of syncope, most likely diagnosis is orthostatic versus vasovagal syncope  especially in the setting of recent upper respiratory illness and poor p.o. intake over the past few days. Other diagnoses were considered including (but not limited to) pulmonary embolism, ACS, cardiogenic syncope. These are considered less likely due to history of present illness and physical exam findings.   This is most consistent with an acute life/limb threatening illness complicated by underlying chronic conditions. FAINT score will be utilized for risk for cardiac etiology.   Initial Plan:  Screening labs including CBC and Metabolic panel to evaluate for infectious or metabolic etiology of disease.  Urinalysis with reflex culture ordered to evaluate for UTI or relevant urologic/nephrologic pathology.  CXR to evaluate for structural/infectious intrathoracic pathology.  Troponin, BNP, EKG to evaluate for cardiac pathology.  In particular, BNP utilized to evaluate for heart failure as etiology of patient's presenting hypotension. CT head without acute pathology Objective evaluation as below reviewed with plan for close reassessment  Initial Study Results:   Laboratory  All laboratory results reviewed without evidence of clinically relevant pathology.   Exceptions include ongoing CKD with BUN and creatinine greater than 20.  EKG EKG was reviewed independently. Rate, rhythm, axis, intervals all examined and without medically relevant abnormality. ST segments without concerns for elevations.  Particular no U waves.  Radiology  All images reviewed independently. Agree with radiology report at this time.   CT HEAD WO CONTRAST (5MM)  Result Date: 02/02/2022 CLINICAL DATA:  Fall.  Head trauma. EXAM: CT HEAD WITHOUT CONTRAST TECHNIQUE: Contiguous axial images were obtained from the base of the skull through the vertex without intravenous contrast. RADIATION DOSE REDUCTION: This exam was performed according to the departmental dose-optimization program which includes automated exposure control,  adjustment of the mA and/or kV according to patient size and/or use of iterative reconstruction technique. COMPARISON:  None Available. FINDINGS: Brain: No evidence of acute infarction, hemorrhage, hydrocephalus, extra-axial collection or mass lesion/mass effect. Mild ventricular sulcal enlargement reflecting age-appropriate volume loss. Vascular: No hyperdense vessel or unexpected calcification. Skull: Normal. Negative for fracture or focal lesion. Sinuses/Orbits: Globes and orbits are unremarkable. Visualized sinuses are clear. Other: None. IMPRESSION: 1. No acute intracranial abnormalities. Electronically Signed   By: Lajean Manes M.D.   On: 02/02/2022 16:59   DG Chest Portable 1 View  Result Date: 02/02/2022 CLINICAL DATA:  Syncope.  Fatigue.  Fall today. EXAM: PORTABLE CHEST 1 VIEW COMPARISON:  11/19/2016. FINDINGS: Cardiac silhouette is normal in size. No mediastinal or hilar masses. Clear lungs.  No pleural effusion or pneumothorax. Skeletal structures are grossly intact. IMPRESSION: No active disease. Electronically Signed   By: Lajean Manes M.D.   On: 02/02/2022 16:48      Final Assessment and Plan:   FAI NT score 0 reassuring for low risk for serious cardiac event in the next 30 days.  However patient  does have an elevated age and should follow-up with primary care provider for reassessment in the next 48 hours. Favor the patient's case was orthostatic as he was hypotensive and lightheaded when EMS first arrived and has had complete symptomatic resolution after IV fluids.  Further IV fluids provided in the emergency department with ongoing symptomatic resolution.  Patient requesting discharge.  Family feels comfortable with discharge.  Ongoing renal disease appreciated and family informed of findings of chronic kidney disease and mild renal insufficiency.  Otherwise well-appearing with no indication for intervention at this time.  Patient discharged with no further acute events.   Clinical  Impression:  1. AKI (acute kidney injury) ([facility])      Data Unavailable   Final Clinical Impression(s) / ED Diagnoses Final diagnoses:  AKI (acute kidney injury) [facility])    Rx / [facility] Orders ED Discharge Orders     None         Tretha Sciara, MD 02/02/22 1805

## 2022-02-03 ENCOUNTER — Telehealth: Payer: Self-pay | Admitting: Pharmacist

## 2022-02-03 ENCOUNTER — Telehealth: Payer: Self-pay | Admitting: Internal Medicine

## 2022-02-03 NOTE — Telephone Encounter (Signed)
Called to check to see if patient was still having itching behind his ear and on neck (area that he had Shingles outbreak) patient reports today that it is better.

## 2022-02-03 NOTE — Telephone Encounter (Signed)
Please call patient, arrange a ER follow-up within the next 2 to 3 days.

## 2022-02-04 ENCOUNTER — Encounter: Payer: Self-pay | Admitting: Internal Medicine

## 2022-02-04 ENCOUNTER — Other Ambulatory Visit (HOSPITAL_BASED_OUTPATIENT_CLINIC_OR_DEPARTMENT_OTHER): Payer: Self-pay

## 2022-02-04 ENCOUNTER — Ambulatory Visit (INDEPENDENT_AMBULATORY_CARE_PROVIDER_SITE_OTHER): Payer: Medicare Other | Admitting: Internal Medicine

## 2022-02-04 VITALS — BP 132/70 | HR 78 | Temp 98.2°F | Resp 16 | Ht 69.0 in | Wt 140.0 lb

## 2022-02-04 DIAGNOSIS — R296 Repeated falls: Secondary | ICD-10-CM | POA: Diagnosis not present

## 2022-02-04 DIAGNOSIS — R269 Unspecified abnormalities of gait and mobility: Secondary | ICD-10-CM | POA: Diagnosis not present

## 2022-02-04 DIAGNOSIS — R55 Syncope and collapse: Secondary | ICD-10-CM | POA: Diagnosis not present

## 2022-02-04 DIAGNOSIS — Z23 Encounter for immunization: Secondary | ICD-10-CM

## 2022-02-04 DIAGNOSIS — R531 Weakness: Secondary | ICD-10-CM

## 2022-02-04 LAB — BASIC METABOLIC PANEL
BUN: 33 mg/dL — ABNORMAL HIGH (ref 6–23)
CO2: 26 mEq/L (ref 19–32)
Calcium: 10.2 mg/dL (ref 8.4–10.5)
Chloride: 100 mEq/L (ref 96–112)
Creatinine, Ser: 1.48 mg/dL (ref 0.40–1.50)
GFR: 42.51 mL/min — ABNORMAL LOW (ref >60.00)
Glucose, Bld: 124 mg/dL — ABNORMAL HIGH (ref 70–99)
Potassium: 5.3 mEq/L — ABNORMAL HIGH (ref 3.5–5.1)
Sodium: 135 mEq/L (ref 135–145)

## 2022-02-04 LAB — CBC WITH DIFFERENTIAL/PLATELET
Basophils Absolute: 0.1 10*3/uL (ref 0.0–0.1)
Basophils Relative: 0.7 % (ref 0.0–3.0)
Eosinophils Absolute: 0.4 10*3/uL (ref 0.0–0.7)
Eosinophils Relative: 5.8 % — ABNORMAL HIGH (ref 0.0–5.0)
HCT: 33.2 % — ABNORMAL LOW (ref 39.0–52.0)
Hemoglobin: 10.8 g/dL — ABNORMAL LOW (ref 13.0–17.0)
Lymphocytes Relative: 18.2 % (ref 12.0–46.0)
Lymphs Abs: 1.3 10*3/uL (ref 0.7–4.0)
MCHC: 32.4 g/dL (ref 30.0–36.0)
MCV: 86.6 fl (ref 78.0–100.0)
Monocytes Absolute: 0.6 10*3/uL (ref 0.1–1.0)
Monocytes Relative: 8.4 % (ref 3.0–12.0)
Neutro Abs: 4.7 10*3/uL (ref 1.4–7.7)
Neutrophils Relative %: 66.9 % (ref 43.0–77.0)
Platelets: 270 10*3/uL (ref 150.0–400.0)
RBC: 3.84 Mil/uL — ABNORMAL LOW (ref 4.22–5.81)
RDW: 15.9 % — ABNORMAL HIGH (ref 11.5–15.5)
WBC: 7.1 10*3/uL (ref 4.0–10.5)

## 2022-02-04 NOTE — Telephone Encounter (Signed)
Patient was in office to see Dr Larose Kells today and asked for cream to help with Shingles related itching. Yesterday we has disucced trying over-the-counter lidocein 1% cream. Patient thought it was a prescription medication and had ask Dr Larose Kells about it. I called patient to let him know he can purchase over-the-counter.

## 2022-02-04 NOTE — Progress Notes (Unsigned)
Subjective:    Patient ID: Marco [name], male    DOB: 1933/09/03, 87 y.o.   MRN: [id number]  DOS:  02/04/2022 Type of visit - description: ED follow-up, here w/ his son  Martin Majestic to the ER 02/02/2022: The patient had a syncope, today he reports he lost consciousness. EMS arrived, he was hypotensive and initially bradycardic. Responded to IV fluids. At the ER work-up included: Potassium 5.4, creatinine 1.7. BNP normal. Hemoglobin 10.5.  Troponin negative. Chest x-ray no acute. CT head no acute Receive IV fluids  He was felt to be dehydrated, he was not feeling too well prior to the ER visit and possibly not drinking enough fluids.  Was released home. At this point he is back home, feeling better, no further syncope.  When asked, admits that she feels weak when he stands up. No chest pain, no headache. No nausea vomiting.  No diarrhea. Reports that appetite is okay and he is drinking plenty of fluids.  Review of Systems See above   Past Medical History:  Diagnosis Date   Amaurosis fugax 2017   Diabetes mellitus    DJD (degenerative joint disease) 08/25/2013   Elevated PSA    Other and unspecified hyperlipidemia 04/25/2013   Prostate cancer [facility])     Past Surgical History:  Procedure Laterality Date   CATARACT EXTRACTION     Bilaterally   TYMPANIC MEMBRANE REPAIR     R    Current Outpatient Medications  Medication Instructions   Accu-Chek Softclix Lancets lancets CHECK BLOOD SUGAR ONCE  DAILY AS DIRECTED   Alcohol Swabs PADS Use to clean finger prior to testing blood sugars   alendronate (FOSAMAX) 70 MG tablet TAKE 1 TABLET(70 MG) BY MOUTH 1 TIME A WEEK WITH A FULL GLASS OF WATER AND ON AN EMPTY STOMACH   aspirin EC 81 mg, Oral, Daily   atorvastatin (LIPITOR) 40 mg, Oral, Daily at bedtime   Blood Glucose Calibration (ACCU-CHEK GUIDE CONTROL) LIQD Use as directed   Blood Glucose Monitoring Suppl (ACCU-CHEK GUIDE) w/Device KIT Check blood sugars once daily   Calcium  Carb-Cholecalciferol (CALCIUM 500 + D) 500-125 MG-UNIT TABS 1 tablet, Oral, Daily   Cyanocobalamin (VITAMIN B-12 PO) 2,500 mcg, Oral, Daily   ezetimibe (ZETIA) 10 mg, Oral, Daily   gabapentin (NEURONTIN) 100 mg, Oral, 3 times daily   glucose blood (ACCU-CHEK GUIDE) test strip USE TO CHECK BLOOD SUGAR  ONCE DAILY   levothyroxine (SYNTHROID) 100 mcg, Oral, Daily before breakfast   Lokelma 10 g, Oral, Daily, Or as directed by physician   metFORMIN (GLUCOPHAGE) 500 mg, Oral, Daily, *take with a meal*   Multiple Vitamin (MULTI-VITAMIN DAILY PO) 1 tablet, Oral, Daily   nitroGLYCERIN (NITROSTAT) 0.4 mg, Sublingual, Every 5 min x3 PRN   sitaGLIPtin (JANUVIA) 50 mg, Oral, Daily   Xtandi 120 mg, Oral, Daily       Objective:   Physical Exam BP 132/70   Pulse 78   Temp 98.2 F (36.8 C) (Oral)   Resp 16   Ht '5\' 9"'  (1.753 m)   Wt 140 lb (63.5 kg)   SpO2 97%   BMI 20.67 kg/m  General:   Well developed, elderly gentleman, looks chronically ill. HEENT:  Normocephalic . Face symmetric, atraumatic Lungs:  CTA B Normal respiratory effort, no intercostal retractions, no accessory muscle use. Heart: RRR,  no murmur.  Lower extremities: no pretibial edema bilaterally  Skin: Not pale. Not jaundice Neurologic:  alert & oriented X3.  Speech normal, gait  assisted by a rolling walker. Psych--  Cognition and judgment appear intact.  Cooperative with normal attention span and concentration.  Behavior appropriate. No anxious or depressed appearing.      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 Osteoporosis: T score -1.5, hip fracture Osteopenia : DEXA 2015, Rx calcium and vitamin  D.  DEXA 11-2019: Rx Fosamax ED Elevated PSA-prostate nodule:saw urology multiple times, last ~ 2013, declined a bx, declined further eval, aware of risks HOH  PLAN:  Syncope: Work-up at the ER showed a increase potassium, creatinine at baseline, hemoglobin slightly lower than usual.  Imaging negative. Today he feels better, orthostatic vital signs: Significantly drop in BP when he stood up, he however did not feel particularly dizzy. The family wonders if gabapentin (Rx 01/15/2022 for postherpetic neuralgia) has to do with it because orthostatic symptoms are of recent onset. She is not taking any BP meds or diuretics. Plan:  BMP, CBC.   D/c gabapentin-restart if needed, good hydration,  watch for falls, Rx a a walker with a seat at the family request. Refer to PT, fall prevention. Postherpetic neuralgia: Recently dx w/ shingles, Rx gabapentin, seems to help but he is now orthostatic, see above.  We will d/c  gabapentin and consider restart if needed.Marland Kitchen RTC 10 days to 2 weeks   DM: Last A1c 7.7, acceptable in this 88 year old gentleman.  Continue metformin (creatinine pending).  Check A1c.  Further advised with results CKD, Hyperkalemia:  At the last visit, hyperkalemia noted, blood work redrawn nephrology 09/09/2021: Sodium 139, potassium 5.7, calcium 10.5.  Hemoglobin 11.8. He is on Lokelma, BMP was rechecked 10 days ago @ nephrology but patient is not aware of results and requests recheck.  Will do.  Encouraged to call nephrology whenever labs are done. Hypothyroidism: Check TSH High cholesterol: Last LDL 53 at, well-controlled Osteoporosis: Holding Fosamax due to CKD, if GFR is more than 35 we will be able to restart it. Preventive care: Recommend COVID booster and flu shot this fall RTC 4 months

## 2022-02-04 NOTE — Patient Instructions (Addendum)
Drink plenty of fluids  Stop gabapentin  Check the  blood pressure regularly BP GOAL is between 110/65 and  135/85. If it is consistently higher or lower, let me know  Watch for falls   GO TO THE LAB : Get the blood work     [place], Marco [name] back for a checkup in 10 days to 2 weeks

## 2022-02-05 NOTE — Assessment & Plan Note (Signed)
Syncope: Work-up at the ER showed a increase potassium, creatinine at baseline, hemoglobin slightly lower than usual.  Imaging negative. Today he feels better, orthostatic vital signs: Significantly drop in BP when he stood up, he however did not feel particularly dizzy. The family wonders if gabapentin (Rx 01/15/2022 for postherpetic neuralgia) has to do with it because orthostatic symptoms are of recent onset. he is not taking any BP meds or diuretics. Plan:  BMP, CBC.   D/c gabapentin-restart if needed, good hydration,  watch for falls, Rx a a walker with a seat at the family request. Refer to PT, fall prevention. Postherpetic neuralgia: Recently dx w/ shingles, Rx gabapentin, seems to help but he is now orthostatic, see above.  We will d/c  gabapentin and consider restart if needed.Marland Kitchen RTC 10 days to 2 weeks

## 2022-02-07 ENCOUNTER — Telehealth: Payer: Self-pay | Admitting: Internal Medicine

## 2022-02-07 ENCOUNTER — Telehealth: Payer: Self-pay

## 2022-02-07 NOTE — Telephone Encounter (Signed)
Caller/Agency: Keith Rake [facility] [place] Number: [phone] Requesting OT/PT/Skilled Nursing/Social Work/Speech Therapy: Home health Frequency: 2x4 , 1x1  Also patient is requesting a stair lift. Is this something an order can be written for? Please call to provide verbal orders.

## 2022-02-07 NOTE — Telephone Encounter (Signed)
      Patient  visited [facility] on 02/02/2022  for Fainting  Telephone encounter attempt :  1st Attempt  A HIPAA compliant voice message was left requesting a return call.  Instructed patient to call back at [phone] at their earliest convenience.  [facility] management  [place], [place]  Main Phone: [phone]  E-mail: [email or web address]  Website: [email or web address]

## 2022-02-07 NOTE — Telephone Encounter (Signed)
Physical therapist would like to make Dr. Larose Kells aware that she has been trying to reach the patient for PT but has not been able too. She left a vm on his and his son's numbers.   Cleatrice Burke from [facility]  [phone]

## 2022-02-07 NOTE — Telephone Encounter (Signed)
[place] message sent to Pt.

## 2022-02-10 ENCOUNTER — Other Ambulatory Visit: Payer: Self-pay | Admitting: Internal Medicine

## 2022-02-10 ENCOUNTER — Telehealth: Payer: Self-pay

## 2022-02-10 NOTE — Telephone Encounter (Signed)
     Patient  visited [facility] on 02/02/2022  for fainting   Telephone encounter attempt :  2nd Attempt  A HIPAA compliant voice message was left requesting a return call.  Instructed patient to call back at [phone] at their earliest convenience.  [facility] management  [place], [place]  Main Phone: [phone]  E-mail: [email or web address]  Website: [email or web address]

## 2022-02-10 NOTE — Telephone Encounter (Signed)
LMOM giving verbal orders for [facility]. Informed I'm sure we can give rx for stair lift- however unsure who can supply it. Informed I'd ask around.

## 2022-02-11 ENCOUNTER — Encounter (HOSPITAL_COMMUNITY): Payer: Self-pay | Admitting: Emergency Medicine

## 2022-02-11 ENCOUNTER — Emergency Department (HOSPITAL_COMMUNITY): Payer: Medicare Other

## 2022-02-11 ENCOUNTER — Inpatient Hospital Stay (HOSPITAL_COMMUNITY)
Admission: EM | Admit: 2022-02-11 | Discharge: 2022-02-15 | DRG: 535 | Disposition: A | Discharge status: Skilled Nursing Facility | Payer: Medicare Other | Attending: Family Medicine | Admitting: Family Medicine

## 2022-02-11 ENCOUNTER — Telehealth: Payer: Self-pay

## 2022-02-11 ENCOUNTER — Other Ambulatory Visit: Payer: Self-pay

## 2022-02-11 DIAGNOSIS — M79604 Pain in right leg: Secondary | ICD-10-CM | POA: Diagnosis not present

## 2022-02-11 DIAGNOSIS — G319 Degenerative disease of nervous system, unspecified: Secondary | ICD-10-CM | POA: Diagnosis not present

## 2022-02-11 DIAGNOSIS — M810 Age-related osteoporosis without current pathological fracture: Secondary | ICD-10-CM | POA: Diagnosis not present

## 2022-02-11 DIAGNOSIS — L989 Disorder of the skin and subcutaneous tissue, unspecified: Secondary | ICD-10-CM | POA: Diagnosis not present

## 2022-02-11 DIAGNOSIS — G629 Polyneuropathy, unspecified: Secondary | ICD-10-CM | POA: Diagnosis present

## 2022-02-11 DIAGNOSIS — K5900 Constipation, unspecified: Secondary | ICD-10-CM | POA: Diagnosis not present

## 2022-02-11 DIAGNOSIS — Z7982 Long term (current) use of aspirin: Secondary | ICD-10-CM

## 2022-02-11 DIAGNOSIS — S32591A Other specified fracture of right pubis, initial encounter for closed fracture: Principal | ICD-10-CM | POA: Diagnosis present

## 2022-02-11 DIAGNOSIS — G8929 Other chronic pain: Secondary | ICD-10-CM | POA: Diagnosis not present

## 2022-02-11 DIAGNOSIS — E785 Hyperlipidemia, unspecified: Secondary | ICD-10-CM | POA: Diagnosis present

## 2022-02-11 DIAGNOSIS — W109XXA Fall (on) (from) unspecified stairs and steps, initial encounter: Secondary | ICD-10-CM | POA: Diagnosis present

## 2022-02-11 DIAGNOSIS — E119 Type 2 diabetes mellitus without complications: Secondary | ICD-10-CM | POA: Diagnosis not present

## 2022-02-11 DIAGNOSIS — Y92009 Unspecified place in unspecified non-institutional (private) residence as the place of occurrence of the external cause: Secondary | ICD-10-CM | POA: Diagnosis not present

## 2022-02-11 DIAGNOSIS — R296 Repeated falls: Secondary | ICD-10-CM | POA: Diagnosis present

## 2022-02-11 DIAGNOSIS — S329XXA Fracture of unspecified parts of lumbosacral spine and pelvis, initial encounter for closed fracture: Secondary | ICD-10-CM

## 2022-02-11 DIAGNOSIS — N179 Acute kidney failure, unspecified: Secondary | ICD-10-CM | POA: Diagnosis not present

## 2022-02-11 DIAGNOSIS — Z8616 Personal history of COVID-19: Secondary | ICD-10-CM | POA: Diagnosis not present

## 2022-02-11 DIAGNOSIS — R6889 Other general symptoms and signs: Secondary | ICD-10-CM | POA: Diagnosis not present

## 2022-02-11 DIAGNOSIS — M438X8 Other specified deforming dorsopathies, sacral and sacrococcygeal region: Secondary | ICD-10-CM | POA: Diagnosis not present

## 2022-02-11 DIAGNOSIS — M25561 Pain in right knee: Secondary | ICD-10-CM | POA: Diagnosis not present

## 2022-02-11 DIAGNOSIS — Z79899 Other long term (current) drug therapy: Secondary | ICD-10-CM

## 2022-02-11 DIAGNOSIS — W19XXXA Unspecified fall, initial encounter: Secondary | ICD-10-CM | POA: Diagnosis not present

## 2022-02-11 DIAGNOSIS — M170 Bilateral primary osteoarthritis of knee: Secondary | ICD-10-CM | POA: Diagnosis not present

## 2022-02-11 DIAGNOSIS — I951 Orthostatic hypotension: Secondary | ICD-10-CM | POA: Diagnosis not present

## 2022-02-11 DIAGNOSIS — E039 Hypothyroidism, unspecified: Secondary | ICD-10-CM | POA: Diagnosis not present

## 2022-02-11 DIAGNOSIS — R531 Weakness: Secondary | ICD-10-CM | POA: Diagnosis not present

## 2022-02-11 DIAGNOSIS — B029 Zoster without complications: Secondary | ICD-10-CM | POA: Diagnosis not present

## 2022-02-11 DIAGNOSIS — Z23 Encounter for immunization: Secondary | ICD-10-CM | POA: Diagnosis not present

## 2022-02-11 DIAGNOSIS — Z8546 Personal history of malignant neoplasm of prostate: Secondary | ICD-10-CM

## 2022-02-11 DIAGNOSIS — I5032 Chronic diastolic (congestive) heart failure: Secondary | ICD-10-CM | POA: Diagnosis not present

## 2022-02-11 DIAGNOSIS — S199XXA Unspecified injury of neck, initial encounter: Secondary | ICD-10-CM | POA: Diagnosis not present

## 2022-02-11 DIAGNOSIS — G453 Amaurosis fugax: Secondary | ICD-10-CM | POA: Diagnosis not present

## 2022-02-11 DIAGNOSIS — T50995A Adverse effect of other drugs, medicaments and biological substances, initial encounter: Secondary | ICD-10-CM | POA: Diagnosis present

## 2022-02-11 DIAGNOSIS — N1832 Chronic kidney disease, stage 3b: Secondary | ICD-10-CM | POA: Diagnosis not present

## 2022-02-11 DIAGNOSIS — U071 COVID-19: Secondary | ICD-10-CM | POA: Diagnosis present

## 2022-02-11 DIAGNOSIS — I2510 Atherosclerotic heart disease of native coronary artery without angina pectoris: Secondary | ICD-10-CM | POA: Diagnosis not present

## 2022-02-11 DIAGNOSIS — E860 Dehydration: Secondary | ICD-10-CM | POA: Diagnosis present

## 2022-02-11 DIAGNOSIS — S3210XD Unspecified fracture of sacrum, subsequent encounter for fracture with routine healing: Secondary | ICD-10-CM | POA: Diagnosis not present

## 2022-02-11 DIAGNOSIS — R972 Elevated prostate specific antigen [PSA]: Secondary | ICD-10-CM | POA: Diagnosis present

## 2022-02-11 DIAGNOSIS — E1165 Type 2 diabetes mellitus with hyperglycemia: Secondary | ICD-10-CM | POA: Diagnosis not present

## 2022-02-11 DIAGNOSIS — S0121XA Laceration without foreign body of nose, initial encounter: Secondary | ICD-10-CM | POA: Diagnosis not present

## 2022-02-11 DIAGNOSIS — S2232XA Fracture of one rib, left side, initial encounter for closed fracture: Secondary | ICD-10-CM | POA: Diagnosis not present

## 2022-02-11 DIAGNOSIS — R404 Transient alteration of awareness: Secondary | ICD-10-CM | POA: Diagnosis not present

## 2022-02-11 DIAGNOSIS — S0990XA Unspecified injury of head, initial encounter: Principal | ICD-10-CM | POA: Diagnosis present

## 2022-02-11 DIAGNOSIS — I6782 Cerebral ischemia: Secondary | ICD-10-CM | POA: Diagnosis not present

## 2022-02-11 DIAGNOSIS — E1122 Type 2 diabetes mellitus with diabetic chronic kidney disease: Secondary | ICD-10-CM | POA: Diagnosis not present

## 2022-02-11 DIAGNOSIS — B0229 Other postherpetic nervous system involvement: Secondary | ICD-10-CM | POA: Diagnosis present

## 2022-02-11 DIAGNOSIS — Z7984 Long term (current) use of oral hypoglycemic drugs: Secondary | ICD-10-CM

## 2022-02-11 DIAGNOSIS — R079 Chest pain, unspecified: Secondary | ICD-10-CM | POA: Diagnosis not present

## 2022-02-11 DIAGNOSIS — R000 Tachycardia, unspecified: Secondary | ICD-10-CM | POA: Diagnosis not present

## 2022-02-11 DIAGNOSIS — E538 Deficiency of other specified B group vitamins: Secondary | ICD-10-CM | POA: Diagnosis not present

## 2022-02-11 DIAGNOSIS — M1990 Unspecified osteoarthritis, unspecified site: Secondary | ICD-10-CM | POA: Diagnosis not present

## 2022-02-11 DIAGNOSIS — Z743 Need for continuous supervision: Secondary | ICD-10-CM | POA: Diagnosis not present

## 2022-02-11 DIAGNOSIS — R42 Dizziness and giddiness: Secondary | ICD-10-CM | POA: Diagnosis not present

## 2022-02-11 DIAGNOSIS — Z7983 Long term (current) use of bisphosphonates: Secondary | ICD-10-CM

## 2022-02-11 DIAGNOSIS — D519 Vitamin B12 deficiency anemia, unspecified: Secondary | ICD-10-CM | POA: Diagnosis not present

## 2022-02-11 DIAGNOSIS — M47812 Spondylosis without myelopathy or radiculopathy, cervical region: Secondary | ICD-10-CM | POA: Diagnosis not present

## 2022-02-11 DIAGNOSIS — Z7401 Bed confinement status: Secondary | ICD-10-CM | POA: Diagnosis not present

## 2022-02-11 DIAGNOSIS — N183 Chronic kidney disease, stage 3 unspecified: Secondary | ICD-10-CM | POA: Diagnosis not present

## 2022-02-11 DIAGNOSIS — R55 Syncope and collapse: Secondary | ICD-10-CM | POA: Diagnosis not present

## 2022-02-11 LAB — COMPREHENSIVE METABOLIC PANEL
ALT: 21 U/L (ref 0–44)
AST: 37 U/L (ref 15–41)
Albumin: 3.7 g/dL (ref 3.5–5.0)
Alkaline Phosphatase: 79 U/L (ref 38–126)
Anion gap: 12 (ref 5–15)
BUN: 27 mg/dL — ABNORMAL HIGH (ref 8–23)
CO2: 23 mmol/L (ref 22–32)
Calcium: 10.2 mg/dL (ref 8.9–10.3)
Chloride: 95 mmol/L — ABNORMAL LOW (ref 98–111)
Creatinine, Ser: 1.62 mg/dL — ABNORMAL HIGH (ref 0.61–1.24)
GFR, Estimated: 41 mL/min — ABNORMAL LOW (ref >60)
Glucose, Bld: 172 mg/dL — ABNORMAL HIGH (ref 70–99)
Potassium: 5.1 mmol/L (ref 3.5–5.1)
Sodium: 130 mmol/L — ABNORMAL LOW (ref 135–145)
Total Bilirubin: 1 mg/dL (ref 0.3–1.2)
Total Protein: 7.5 g/dL (ref 6.5–8.1)

## 2022-02-11 LAB — CBC WITH DIFFERENTIAL/PLATELET
Abs Immature Granulocytes: 0.06 10*3/uL (ref 0.00–0.07)
Basophils Absolute: 0 10*3/uL (ref 0.0–0.1)
Basophils Relative: 0 %
Eosinophils Absolute: 0 10*3/uL (ref 0.0–0.5)
Eosinophils Relative: 1 %
HCT: 34.3 % — ABNORMAL LOW (ref 39.0–52.0)
Hemoglobin: 11.1 g/dL — ABNORMAL LOW (ref 13.0–17.0)
Immature Granulocytes: 1 %
Lymphocytes Relative: 12 %
Lymphs Abs: 1 10*3/uL (ref 0.7–4.0)
MCH: 28.1 pg (ref 26.0–34.0)
MCHC: 32.4 g/dL (ref 30.0–36.0)
MCV: 86.8 fL (ref 80.0–100.0)
Monocytes Absolute: 0.7 10*3/uL (ref 0.1–1.0)
Monocytes Relative: 9 %
Neutro Abs: 6.4 10*3/uL (ref 1.7–7.7)
Neutrophils Relative %: 77 %
Platelets: 248 10*3/uL (ref 150–400)
RBC: 3.95 MIL/uL — ABNORMAL LOW (ref 4.22–5.81)
RDW: 14.9 % (ref 11.5–15.5)
WBC: 8.3 10*3/uL (ref 4.0–10.5)
nRBC: 0 % (ref 0.0–0.2)

## 2022-02-11 LAB — PROTIME-INR
INR: 1.1 (ref 0.8–1.2)
Prothrombin Time: 14.5 seconds (ref 11.4–15.2)

## 2022-02-11 LAB — RESP PANEL BY RT-PCR (FLU A&B, COVID) ARPGX2
Influenza A by PCR: NEGATIVE
Influenza B by PCR: NEGATIVE
SARS Coronavirus 2 by RT PCR: POSITIVE — AB

## 2022-02-11 LAB — URINALYSIS, ROUTINE W REFLEX MICROSCOPIC
Bilirubin Urine: NEGATIVE
Glucose, UA: NEGATIVE mg/dL
Hgb urine dipstick: NEGATIVE
Ketones, ur: NEGATIVE mg/dL
Leukocytes,Ua: NEGATIVE
Nitrite: NEGATIVE
Protein, ur: NEGATIVE mg/dL
Specific Gravity, Urine: 1.008 (ref 1.005–1.030)
pH: 6 (ref 5.0–8.0)

## 2022-02-11 LAB — LACTIC ACID, PLASMA: Lactic Acid, Venous: 1.5 mmol/L (ref 0.5–1.9)

## 2022-02-11 LAB — I-STAT CHEM 8, ED
BUN: 28 mg/dL — ABNORMAL HIGH (ref 8–23)
Calcium, Ion: 1.15 mmol/L (ref 1.15–1.40)
Chloride: 99 mmol/L (ref 98–111)
Creatinine, Ser: 1.7 mg/dL — ABNORMAL HIGH (ref 0.61–1.24)
Glucose, Bld: 170 mg/dL — ABNORMAL HIGH (ref 70–99)
HCT: 35 % — ABNORMAL LOW (ref 39.0–52.0)
Hemoglobin: 11.9 g/dL — ABNORMAL LOW (ref 13.0–17.0)
Potassium: 5 mmol/L (ref 3.5–5.1)
Sodium: 130 mmol/L — ABNORMAL LOW (ref 135–145)
TCO2: 22 mmol/L (ref 22–32)

## 2022-02-11 LAB — TROPONIN I (HIGH SENSITIVITY)
Troponin I (High Sensitivity): 10 ng/L (ref <18)
Troponin I (High Sensitivity): 10 ng/L (ref <18)

## 2022-02-11 LAB — ETHANOL: Alcohol, Ethyl (B): <10 mg/dL (ref <10)

## 2022-02-11 LAB — CK: Total CK: 331 U/L (ref 49–397)

## 2022-02-11 MED ORDER — ATORVASTATIN CALCIUM 40 MG PO TABS
40.0000 mg | ORAL_TABLET | Freq: Every day | ORAL | Status: DC
  Administered 2022-02-11 – 2022-02-15 (×5): 40 mg via ORAL
  Filled 2022-02-11 (×5): qty 1

## 2022-02-11 MED ORDER — ADULT MULTIVITAMIN W/MINERALS CH
1.0000 | ORAL_TABLET | Freq: Every day | ORAL | Status: DC
  Administered 2022-02-12 – 2022-02-15 (×4): 1 via ORAL
  Filled 2022-02-11 (×4): qty 1

## 2022-02-11 MED ORDER — LEVOTHYROXINE SODIUM 100 MCG PO TABS
100.0000 ug | ORAL_TABLET | Freq: Every day | ORAL | Status: DC
  Administered 2022-02-12 – 2022-02-15 (×4): 100 ug via ORAL
  Filled 2022-02-11 (×4): qty 1

## 2022-02-11 MED ORDER — EZETIMIBE 10 MG PO TABS
10.0000 mg | ORAL_TABLET | Freq: Every day | ORAL | Status: DC
  Administered 2022-02-12 – 2022-02-15 (×4): 10 mg via ORAL
  Filled 2022-02-11 (×4): qty 1

## 2022-02-11 MED ORDER — VITAMIN B-12 1000 MCG PO TABS
2500.0000 ug | ORAL_TABLET | Freq: Every day | ORAL | Status: DC
  Administered 2022-02-12 – 2022-02-15 (×4): 2500 ug via ORAL
  Filled 2022-02-11 (×4): qty 3

## 2022-02-11 MED ORDER — SODIUM CHLORIDE 0.9 % IV SOLN: Freq: Once | INTRAVENOUS | Status: AC

## 2022-02-11 MED ORDER — SODIUM CHLORIDE 0.9 % IV BOLUS
500.0000 mL | Freq: Once | INTRAVENOUS | Status: AC
  Administered 2022-02-11: 500 mL via INTRAVENOUS

## 2022-02-11 MED ORDER — MORPHINE SULFATE (PF) 2 MG/ML IV SOLN
2.0000 mg | Freq: Once | INTRAVENOUS | Status: AC
  Administered 2022-02-11: 2 mg via INTRAVENOUS
  Filled 2022-02-11: qty 1

## 2022-02-11 MED ORDER — ENOXAPARIN SODIUM 30 MG/0.3ML IJ SOSY
30.0000 mg | PREFILLED_SYRINGE | Freq: Every day | INTRAMUSCULAR | Status: DC
  Administered 2022-02-12 – 2022-02-15 (×4): 30 mg via SUBCUTANEOUS
  Filled 2022-02-11 (×4): qty 0.3

## 2022-02-11 MED ORDER — TETANUS-DIPHTH-ACELL PERTUSSIS 5-2.5-18.5 LF-MCG/0.5 IM SUSY
0.5000 mL | PREFILLED_SYRINGE | Freq: Once | INTRAMUSCULAR | Status: AC
  Administered 2022-02-11: 0.5 mL via INTRAMUSCULAR
  Filled 2022-02-11: qty 0.5

## 2022-02-11 MED ORDER — ENZALUTAMIDE 40 MG PO TABS: 120.0000 mg | ORAL_TABLET | Freq: Every day | ORAL | Status: DC

## 2022-02-11 NOTE — Telephone Encounter (Signed)
Okay to refill, takes 1 every other day

## 2022-02-11 NOTE — Telephone Encounter (Signed)
     Patient  visited [facility] on 02/02/2022  for fainting   Telephone encounter attempt :  3rd Attempt  A HIPAA compliant voice message was left requesting a return call.  Instructed patient to call back at [phone] at their earliest convenience.  [facility] management  [place], [place]  Main Phone: [phone]  E-mail: [email or web address]  Website: [email or web address]

## 2022-02-11 NOTE — H&P (Incomplete)
History and Physical  FAMOUS EISENHARDT URK:[id number] DOB: [birth year] DOA: 02/11/2022  Referring physician: Dr. Francia Greaves, [place]  PCP: Colon Branch, MD  Outpatient Specialists: Medical oncology Patient coming from: Home  Chief Complaint: Fall   HPI: Marco [name] is a 88 y.o. male with medical history significant for prostate cancer on Xtandi, hyperlipidemia, hypothyroidism, vitamin B12 deficiency, who presented to the ED after a fall at home.  The patient fell backwards on the stairs.  Hit his head.  Unclear if he lost consciousness.  In the ED, work-up revealed negative CT head and negative CT cervical spine.  Due to pain in right hip Rehydralyte x-ray which showed right rami fracture.  History of R prior hip replacement.  TRH, hospitalist service, was asked to admit.  ED Course: T97.9.  BP 140/89, pulse 87, respiratory rate 15, O2 saturation 99% on room air.  Lab studies remarkable for positive COVID-19 screening test.  Serum sodium 130, glucose 170, BUN 28, creatinine 1.70 with baseline creatinine of 1.5 with GFR of 37.  Review of Systems: Review of systems as noted in the HPI. All other systems reviewed and are negative.   Past Medical History:  Diagnosis Date  . Amaurosis fugax 2017  . Diabetes mellitus   . DJD (degenerative joint disease) 08/25/2013  . Elevated PSA   . Other and unspecified hyperlipidemia 04/25/2013  . Prostate cancer [facility])    Past Surgical History:  Procedure Laterality Date  . CATARACT EXTRACTION     Bilaterally  . TYMPANIC MEMBRANE REPAIR     R    Social History:  reports that he has never smoked. He has never used smokeless tobacco. He reports that he does not drink alcohol and does not use drugs.   No Known Allergies  Family History  Problem Relation Age of Onset  . Other Mother        Marco   . Other Father        Marco  . Diabetes Neg Hx   . CAD Neg Hx   . Cancer Neg Hx       Prior to Admission medications   Medication Sig  Start Date End Date Taking? Authorizing Provider  Accu-Chek Softclix Lancets lancets CHECK BLOOD SUGAR ONCE  DAILY AS DIRECTED 10/29/21   Colon Branch, MD  Alcohol Swabs PADS Use to clean finger prior to testing blood sugars 03/13/21   Colon Branch, MD  alendronate (FOSAMAX) 70 MG tablet TAKE 1 TABLET(70 MG) BY MOUTH 1 TIME A WEEK WITH A FULL GLASS OF WATER AND ON AN EMPTY STOMACH Patient not taking: Reported on 01/22/2022 04/30/21   Colon Branch, MD  aspirin EC 81 MG EC tablet Take 1 tablet (81 mg total) by mouth daily. 11/22/16   Johnson, Clanford L, MD  atorvastatin (LIPITOR) 40 MG tablet Take 1 tablet (40 mg total) by mouth at bedtime. 10/30/21   Colon Branch, MD  Blood Glucose Calibration (ACCU-CHEK GUIDE CONTROL) LIQD Use as directed 12/24/20   Colon Branch, MD  Blood Glucose Monitoring Suppl (ACCU-CHEK GUIDE) w/Device KIT Check blood sugars once daily 03/13/21   Colon Branch, MD  Calcium Carb-Cholecalciferol (CALCIUM 500 + D) 500-125 MG-UNIT TABS Take 1 tablet by mouth daily.    [provider]  Cyanocobalamin (VITAMIN B-12 PO) Take 2,500 mcg by mouth daily.    [provider]  ezetimibe (ZETIA) 10 MG tablet Take 1 tablet (10 mg total) by mouth daily. 10/30/21  Paz, Jacqulyn Bath E, MD  glucose blood (ACCU-CHEK GUIDE) test strip USE TO CHECK BLOOD SUGAR  ONCE DAILY 10/29/21   Colon Branch, MD  levothyroxine (SYNTHROID) 100 MCG tablet Take 1 tablet (100 mcg total) by mouth daily before breakfast. 02/11/22   Colon Branch, MD  metFORMIN (GLUCOPHAGE) 500 MG tablet Take 1 tablet (500 mg total) by mouth daily. *take with a meal* 10/30/21   Colon Branch, MD  Multiple Vitamin (MULTI-VITAMIN DAILY PO) Take 1 tablet by mouth daily.    [provider]  nitroGLYCERIN (NITROSTAT) 0.4 MG SL tablet Place 1 tablet (0.4 mg total) under the tongue every 5 (five) minutes x 3 doses as needed for chest pain. Patient not taking: Reported on 01/22/2022 05/14/21   Colon Branch, MD  sitaGLIPtin (JANUVIA) 50 MG tablet  Take 1 tablet (50 mg total) by mouth daily. 02/11/22   Colon Branch, MD  sodium zirconium cyclosilicate (LOKELMA) 10 g PACK packet Mix 1 packet in 3 tablespoons of water and drink by mouth immediately every other day 02/11/22   Colon Branch, MD  XTANDI 40 MG tablet Take 120 mg by mouth daily. 07/12/21   [provider]    Physical Exam: BP (!) 140/89   Pulse 91   Temp 97.8 F (36.6 C) (Oral)   Resp 11   SpO2 99%   General: 88 y.o. year-old male well developed well nourished in no acute distress.  Alert and oriented x3. Cardiovascular: Regular rate and rhythm with no rubs or gallops.  No thyromegaly or JVD noted.  No lower extremity edema. 2/4 pulses in all 4 extremities. Respiratory: Clear to auscultation with no wheezes or rales. Good inspiratory effort. Abdomen: Soft nontender nondistended with normal bowel sounds x4 quadrants. Muskuloskeletal: No cyanosis, clubbing or edema noted bilaterally Neuro: CN II-XII intact, strength, sensation, reflexes Skin: No ulcerative lesions noted or rashes Psychiatry: Judgement and insight appear normal. Mood is appropriate for condition and setting          Labs on Admission:  Basic Metabolic Panel: Recent Labs  Lab 02/11/22 1940 02/11/22 1951  NA 130* 130*  K 5.1 5.0  CL 95* 99  CO2 23  --   GLUCOSE 172* 170*  BUN 27* 28*  CREATININE 1.62* 1.70*  CALCIUM 10.2  --    Liver Function Tests: Recent Labs  Lab 02/11/22 1940  AST 37  ALT 21  ALKPHOS 79  BILITOT 1.0  PROT 7.5  ALBUMIN 3.7   No results for input(s): "LIPASE", "AMYLASE" in the last 168 hours. No results for input(s): "AMMONIA" in the last 168 hours. CBC: Recent Labs  Lab 02/11/22 1940 02/11/22 1951  WBC 8.3  --   NEUTROABS 6.4  --   HGB 11.1* 11.9*  HCT 34.3* 35.0*  MCV 86.8  --   PLT 248  --    Cardiac Enzymes: Recent Labs  Lab 02/11/22 1940  CKTOTAL 331    BNP (last 3 results) Recent Labs    02/02/22 1600  BNP 24.8    ProBNP (last 3  results) No results for input(s): "PROBNP" in the last 8760 hours.  CBG: No results for input(s): "GLUCAP" in the last 168 hours.  Radiological Exams on Admission: CT Head Wo Contrast  Result Date: 02/11/2022 CLINICAL DATA:  Trauma EXAM: CT HEAD WITHOUT CONTRAST CT MAXILLOFACIAL WITHOUT CONTRAST CT CERVICAL SPINE WITHOUT CONTRAST TECHNIQUE: Multidetector CT imaging of the head, cervical spine, and maxillofacial structures were performed using the standard  protocol without intravenous contrast. Multiplanar CT image reconstructions of the cervical spine and maxillofacial structures were also generated. RADIATION DOSE REDUCTION: This exam was performed according to the departmental dose-optimization program which includes automated exposure control, adjustment of the mA and/or kV according to patient size and/or use of iterative reconstruction technique. COMPARISON:  CT head dated 02/02/2022 FINDINGS: CT HEAD FINDINGS Brain: No evidence of acute infarction, hemorrhage, hydrocephalus, extra-axial collection or mass lesion/mass effect. Global cortical atrophy. Mild subcortical white matter and periventricular small vessel ischemic changes. Vascular: No hyperdense vessel or unexpected calcification. Skull: Normal. Negative for fracture or focal lesion. Other: Small extracranial hematoma overlying the left parietal bone (series 3/image 17). CT MAXILLOFACIAL FINDINGS Osseous: No evidence of maxillofacial fracture. Nasal bones are intact. Mandible is intact. Bilateral mandibular condyles are well-seated in the TMJs. Orbits: Bilateral orbits, including the globes and retroconal soft tissues, are within normal limits. Sinuses: The visualized paranasal sinuses are essentially clear. The mastoid air cells are unopacified. Soft tissues: Soft tissue swelling/laceration overlying the nasal bridge, left greater than right (series 3/image 50). CT CERVICAL SPINE FINDINGS Alignment: Normal cervical lordosis. Skull base and  vertebrae: No acute fracture. No primary bone lesion or focal pathologic process. Soft tissues and spinal canal: No prevertebral fluid or swelling. No visible canal hematoma. Disc levels: Mild degenerative changes of the mid cervical spine. Spinal canal is patent. Upper chest: Visualized lung apices are clear. Other: Visualized thyroid is unremarkable. IMPRESSION: Small extracranial hematoma overlying the left parietal bone. No evidence of calvarial fracture. No evidence of acute intracranial abnormality. Atrophy with mild small vessel ischemic changes. Soft tissue swelling/laceration overlying the nasal bridge, left greater than right. No evidence of maxillofacial fracture. No traumatic injury to the cervical spine. Mild degenerative changes. Electronically Signed   By: Julian Hy M.D.   On: 02/11/2022 23:15   CT Cervical Spine Wo Contrast  Result Date: 02/11/2022 CLINICAL DATA:  Trauma EXAM: CT HEAD WITHOUT CONTRAST CT MAXILLOFACIAL WITHOUT CONTRAST CT CERVICAL SPINE WITHOUT CONTRAST TECHNIQUE: Multidetector CT imaging of the head, cervical spine, and maxillofacial structures were performed using the standard protocol without intravenous contrast. Multiplanar CT image reconstructions of the cervical spine and maxillofacial structures were also generated. RADIATION DOSE REDUCTION: This exam was performed according to the departmental dose-optimization program which includes automated exposure control, adjustment of the mA and/or kV according to patient size and/or use of iterative reconstruction technique. COMPARISON:  CT head dated 02/02/2022 FINDINGS: CT HEAD FINDINGS Brain: No evidence of acute infarction, hemorrhage, hydrocephalus, extra-axial collection or mass lesion/mass effect. Global cortical atrophy. Mild subcortical white matter and periventricular small vessel ischemic changes. Vascular: No hyperdense vessel or unexpected calcification. Skull: Normal. Negative for fracture or focal lesion.  Other: Small extracranial hematoma overlying the left parietal bone (series 3/image 17). CT MAXILLOFACIAL FINDINGS Osseous: No evidence of maxillofacial fracture. Nasal bones are intact. Mandible is intact. Bilateral mandibular condyles are well-seated in the TMJs. Orbits: Bilateral orbits, including the globes and retroconal soft tissues, are within normal limits. Sinuses: The visualized paranasal sinuses are essentially clear. The mastoid air cells are unopacified. Soft tissues: Soft tissue swelling/laceration overlying the nasal bridge, left greater than right (series 3/image 50). CT CERVICAL SPINE FINDINGS Alignment: Normal cervical lordosis. Skull base and vertebrae: No acute fracture. No primary bone lesion or focal pathologic process. Soft tissues and spinal canal: No prevertebral fluid or swelling. No visible canal hematoma. Disc levels: Mild degenerative changes of the mid cervical spine. Spinal canal is patent. Upper chest: Visualized  lung apices are clear. Other: Visualized thyroid is unremarkable. IMPRESSION: Small extracranial hematoma overlying the left parietal bone. No evidence of calvarial fracture. No evidence of acute intracranial abnormality. Atrophy with mild small vessel ischemic changes. Soft tissue swelling/laceration overlying the nasal bridge, left greater than right. No evidence of maxillofacial fracture. No traumatic injury to the cervical spine. Mild degenerative changes. Electronically Signed   By: Julian Hy M.D.   On: 02/11/2022 23:15   CT Maxillofacial WO CM  Result Date: 02/11/2022 CLINICAL DATA:  Trauma EXAM: CT HEAD WITHOUT CONTRAST CT MAXILLOFACIAL WITHOUT CONTRAST CT CERVICAL SPINE WITHOUT CONTRAST TECHNIQUE: Multidetector CT imaging of the head, cervical spine, and maxillofacial structures were performed using the standard protocol without intravenous contrast. Multiplanar CT image reconstructions of the cervical spine and maxillofacial structures were also  generated. RADIATION DOSE REDUCTION: This exam was performed according to the departmental dose-optimization program which includes automated exposure control, adjustment of the mA and/or kV according to patient size and/or use of iterative reconstruction technique. COMPARISON:  CT head dated 02/02/2022 FINDINGS: CT HEAD FINDINGS Brain: No evidence of acute infarction, hemorrhage, hydrocephalus, extra-axial collection or mass lesion/mass effect. Global cortical atrophy. Mild subcortical white matter and periventricular small vessel ischemic changes. Vascular: No hyperdense vessel or unexpected calcification. Skull: Normal. Negative for fracture or focal lesion. Other: Small extracranial hematoma overlying the left parietal bone (series 3/image 17). CT MAXILLOFACIAL FINDINGS Osseous: No evidence of maxillofacial fracture. Nasal bones are intact. Mandible is intact. Bilateral mandibular condyles are well-seated in the TMJs. Orbits: Bilateral orbits, including the globes and retroconal soft tissues, are within normal limits. Sinuses: The visualized paranasal sinuses are essentially clear. The mastoid air cells are unopacified. Soft tissues: Soft tissue swelling/laceration overlying the nasal bridge, left greater than right (series 3/image 50). CT CERVICAL SPINE FINDINGS Alignment: Normal cervical lordosis. Skull base and vertebrae: No acute fracture. No primary bone lesion or focal pathologic process. Soft tissues and spinal canal: No prevertebral fluid or swelling. No visible canal hematoma. Disc levels: Mild degenerative changes of the mid cervical spine. Spinal canal is patent. Upper chest: Visualized lung apices are clear. Other: Visualized thyroid is unremarkable. IMPRESSION: Small extracranial hematoma overlying the left parietal bone. No evidence of calvarial fracture. No evidence of acute intracranial abnormality. Atrophy with mild small vessel ischemic changes. Soft tissue swelling/laceration overlying the  nasal bridge, left greater than right. No evidence of maxillofacial fracture. No traumatic injury to the cervical spine. Mild degenerative changes. Electronically Signed   By: Julian Hy M.D.   On: 02/11/2022 23:15   DG Chest 1 View  Result Date: 02/11/2022 CLINICAL DATA:  Fall with pain in right leg. EXAM: CHEST  1 VIEW COMPARISON:  Radiograph 02/02/2022 FINDINGS: Lower lung volumes from prior exam. Stable heart size and mediastinal contours allowing for differences in technique. No pneumothorax, large pleural effusion or focal airspace disease. No acute osseous findings. IMPRESSION: Low lung volumes without acute abnormality. Electronically Signed   By: Keith Rake M.D.   On: 02/11/2022 20:31   DG Pelvis 1-2 Views  Result Date: 02/11/2022 CLINICAL DATA:  Post fall with right leg pain. EXAM: PELVIS - 1-2 VIEW COMPARISON:  None Available. FINDINGS: There is slight cortical offset of the right superior pubic ramus which may represent a nondisplaced fracture. No convincing inferior pubic ramus fracture. Femoral head is well seated in the acetabulum. Surgical hardware in the right proximal femur is intact. Pubic symphysis and sacroiliac joints are congruent. IMPRESSION: Findings suspicious for right superior pubic  ramus fracture. Electronically Signed   By: Keith Rake M.D.   On: 02/11/2022 20:30   DG Femur Min 2 Views Right  Result Date: 02/11/2022 CLINICAL DATA:  Fall, pain in right leg. EXAM: RIGHT FEMUR 2 VIEWS COMPARISON:  Radiograph 10/12/2019 FINDINGS: Intramedullary nail with trans trochanteric and distal locking screw fixation. The previous intertrochanteric femur fracture has healed. The trans trochanteric screws again project into the lateral soft tissues by approximately 15 mm. There is no acute or periprosthetic fracture. The femoral head is seated in the acetabulum. Degenerative change of the hip and knee. IMPRESSION: 1. No acute fracture of the right femur. 2.  Intramedullary nail with trans trochanteric and distal locking screw fixation, healed intertrochanteric fracture. Electronically Signed   By: Keith Rake M.D.   On: 02/11/2022 20:28    EKG: I independently viewed the EKG done and my findings are as followed: Sinus tachycardia rate of 103.  Nonspecific ST-T changes.  QTc 415.  Assessment/Plan Present on Admission: . Fall  Principal Problem:   Fall  Fall, unclear etiology Unclear if with loss of consciousness. Obtain orthostatic vital signs  DVT prophylaxis: ***   Code Status: ***   Family Communication: ***   Disposition Plan: ***   Consults called: ***   Admission status: ***    Status is: Inpatient {Inpatient:23812}   Kayleen Memos MD [facility] Pager [phone]  If 7PM-7AM, please contact night-coverage [email or web address] Password [email or web address]  02/11/2022, 11:52 PM

## 2022-02-11 NOTE — ED Triage Notes (Signed)
Pt bib ems from home with reports of falling backwards down approx 6 stairs after getting dizzy while walking up the stairs. Pt with + LOC after fall, unsure of how long he was unconscious. Pt alert and oriented, perrla. Pt not anticoagulated. Complains of R knee pain. Contusion to posterior head. Arrives in [place]. VSS with ems.

## 2022-02-11 NOTE — ED Provider Notes (Signed)
[facility] EMERGENCY DEPARTMENT Provider Note   CSN: [id number] Arrival date & time: 02/11/22  1920     History  Chief Complaint  Patient presents with   Fall    Marco [name] is a 88 y.o. male.  88 year old male with prior medical history as detailed below presents for evaluation.  He arrives from home by EMS.  Patient apparently fell while attempting to climb approximately 6 stairs at his home.  He fell backwards and landed on his back.  Patient was on the ground for at least 2 to 3 hours after the fall.  He was not able to stand secondary to pain.  Patient did strike his head.  Patient complains of painful "lump" to the back of his head.  He also complains of right hip pain.  Patient's son is at bedside and assist with evaluation of the patient.  The history is provided by the patient, a relative and medical records.       Home Medications Prior to Admission medications   Medication Sig Start Date End Date Taking? Authorizing Provider  Accu-Chek Softclix Lancets lancets CHECK BLOOD SUGAR ONCE  DAILY AS DIRECTED 10/29/21   Colon Branch, MD  Alcohol Swabs PADS Use to clean finger prior to testing blood sugars 03/13/21   Colon Branch, MD  alendronate (FOSAMAX) 70 MG tablet TAKE 1 TABLET(70 MG) BY MOUTH 1 TIME A WEEK WITH A FULL GLASS OF WATER AND ON AN EMPTY STOMACH Patient not taking: Reported on 01/22/2022 04/30/21   Colon Branch, MD  aspirin EC 81 MG EC tablet Take 1 tablet (81 mg total) by mouth daily. 11/22/16   Johnson, Clanford L, MD  atorvastatin (LIPITOR) 40 MG tablet Take 1 tablet (40 mg total) by mouth at bedtime. 10/30/21   Colon Branch, MD  Blood Glucose Calibration (ACCU-CHEK GUIDE CONTROL) LIQD Use as directed 12/24/20   Colon Branch, MD  Blood Glucose Monitoring Suppl (ACCU-CHEK GUIDE) w/Device KIT Check blood sugars once daily 03/13/21   Colon Branch, MD  Calcium Carb-Cholecalciferol (CALCIUM 500 + D) 500-125 MG-UNIT TABS Take 1 tablet by mouth  daily.    [provider]  Cyanocobalamin (VITAMIN B-12 PO) Take 2,500 mcg by mouth daily.    [provider]  ezetimibe (ZETIA) 10 MG tablet Take 1 tablet (10 mg total) by mouth daily. 10/30/21   Colon Branch, MD  glucose blood (ACCU-CHEK GUIDE) test strip USE TO CHECK BLOOD SUGAR  ONCE DAILY 10/29/21   Colon Branch, MD  levothyroxine (SYNTHROID) 100 MCG tablet Take 1 tablet (100 mcg total) by mouth daily before breakfast. 02/11/22   Colon Branch, MD  metFORMIN (GLUCOPHAGE) 500 MG tablet Take 1 tablet (500 mg total) by mouth daily. *take with a meal* 10/30/21   Colon Branch, MD  Multiple Vitamin (MULTI-VITAMIN DAILY PO) Take 1 tablet by mouth daily.    [provider]  nitroGLYCERIN (NITROSTAT) 0.4 MG SL tablet Place 1 tablet (0.4 mg total) under the tongue every 5 (five) minutes x 3 doses as needed for chest pain. Patient not taking: Reported on 01/22/2022 05/14/21   Colon Branch, MD  sitaGLIPtin (JANUVIA) 50 MG tablet Take 1 tablet (50 mg total) by mouth daily. 02/11/22   Colon Branch, MD  sodium zirconium cyclosilicate (LOKELMA) 10 g PACK packet Mix 1 packet in 3 tablespoons of water and drink by mouth immediately every other day 02/11/22   Colon Branch,  MD  XTANDI 40 MG tablet Take 120 mg by mouth daily. 07/12/21   [provider]      Allergies    Patient has no known allergies.    Review of Systems   Review of Systems  All other systems reviewed and are negative.   Physical Exam Updated Vital Signs BP 129/78   Pulse 91   Temp 97.9 F (36.6 C) (Oral)   Resp 16   SpO2 100%  Physical Exam Vitals and nursing note reviewed.  Constitutional:      General: He is not in acute distress.    Appearance: Normal appearance. He is well-developed.  HENT:     Head: Normocephalic.     Comments: Contusion with overlying abrasion to the left posterior scalp. Eyes:     Conjunctiva/sclera: Conjunctivae normal.     Pupils: Pupils are equal, round, and reactive to  light.  Cardiovascular:     Rate and Rhythm: Normal rate and regular rhythm.     Heart sounds: Normal heart sounds.  Pulmonary:     Effort: Pulmonary effort is normal. No respiratory distress.     Breath sounds: Normal breath sounds.  Abdominal:     General: There is no distension.     Palpations: Abdomen is soft.     Tenderness: There is no abdominal tenderness.  Musculoskeletal:        General: Tenderness present. No deformity. Normal range of motion.     Cervical back: Normal range of motion and neck supple.     Comments: Diffuse tenderness to the lateral aspect of the right hip and right posterior buttock.  Skin:    General: Skin is warm and dry.  Neurological:     General: No focal deficit present.     Mental Status: He is alert and oriented to person, place, and time. Mental status is at baseline.     ED Results / Procedures / Treatments   Labs (all labs ordered are listed, but only abnormal results are displayed) Labs Reviewed  CBC WITH DIFFERENTIAL/PLATELET - Abnormal; Notable for the following components:      Result Value   RBC 3.95 (*)    Hemoglobin 11.1 (*)    HCT 34.3 (*)    All other components within normal limits  I-STAT CHEM 8, ED - Abnormal; Notable for the following components:   Sodium 130 (*)    BUN 28 (*)    Creatinine, Ser 1.70 (*)    Glucose, Bld 170 (*)    Hemoglobin 11.9 (*)    HCT 35.0 (*)    All other components within normal limits  RESP PANEL BY RT-PCR (FLU A&B, COVID) ARPGX2  LACTIC ACID, PLASMA  URINALYSIS, ROUTINE W REFLEX MICROSCOPIC  COMPREHENSIVE METABOLIC PANEL  ETHANOL  LACTIC ACID, PLASMA  CK  PROTIME-INR  TROPONIN I (HIGH SENSITIVITY)    EKG EKG Interpretation  Date/Time:  Tuesday February 11 2022 19:24:16 EDT Ventricular Rate:  103 PR Interval:  189 QRS Duration: 87 QT Interval:  317 QTC Calculation: 415 R Axis:   3 Text Interpretation: Sinus tachycardia Probable left atrial enlargement Borderline low voltage,  extremity leads Confirmed by Dene Gentry [phone]) on 02/11/2022 7:33:14 PM  Radiology DG Chest 1 View  Result Date: 02/11/2022 CLINICAL DATA:  Fall with pain in right leg. EXAM: CHEST  1 VIEW COMPARISON:  Radiograph 02/02/2022 FINDINGS: Lower lung volumes from prior exam. Stable heart size and mediastinal contours allowing for differences in technique. No pneumothorax, large pleural  effusion or focal airspace disease. No acute osseous findings. IMPRESSION: Low lung volumes without acute abnormality. Electronically Signed   By: Keith Rake M.D.   On: 02/11/2022 20:31   DG Pelvis 1-2 Views  Result Date: 02/11/2022 CLINICAL DATA:  Post fall with right leg pain. EXAM: PELVIS - 1-2 VIEW COMPARISON:  None Available. FINDINGS: There is slight cortical offset of the right superior pubic ramus which may represent a nondisplaced fracture. No convincing inferior pubic ramus fracture. Femoral head is well seated in the acetabulum. Surgical hardware in the right proximal femur is intact. Pubic symphysis and sacroiliac joints are congruent. IMPRESSION: Findings suspicious for right superior pubic ramus fracture. Electronically Signed   By: Keith Rake M.D.   On: 02/11/2022 20:30   DG Femur Min 2 Views Right  Result Date: 02/11/2022 CLINICAL DATA:  Fall, pain in right leg. EXAM: RIGHT FEMUR 2 VIEWS COMPARISON:  Radiograph 10/12/2019 FINDINGS: Intramedullary nail with trans trochanteric and distal locking screw fixation. The previous intertrochanteric femur fracture has healed. The trans trochanteric screws again project into the lateral soft tissues by approximately 15 mm. There is no acute or periprosthetic fracture. The femoral head is seated in the acetabulum. Degenerative change of the hip and knee. IMPRESSION: 1. No acute fracture of the right femur. 2. Intramedullary nail with trans trochanteric and distal locking screw fixation, healed intertrochanteric fracture. Electronically Signed   By:  Keith Rake M.D.   On: 02/11/2022 20:28    Procedures Procedures    Medications Ordered in ED Medications  Tdap (BOOSTRIX) injection 0.5 mL (0.5 mLs Intramuscular Given 02/11/22 1942)  sodium chloride 0.9 % bolus 500 mL (500 mLs Intravenous New Bag/Given 02/11/22 1943)    ED Course/ Medical Decision Making/ A&P                           Medical Decision Making Amount and/or Complexity of Data Reviewed Labs: ordered. Radiology: ordered.  Risk Prescription drug management. Decision regarding hospitalization.    Medical Screen Complete  This patient presented to the ED with complaint of fall, head injury.  This complaint involves an extensive number of treatment options. The initial differential diagnosis includes, but is not limited to, related to fall, metabolic abnormality, etc.  This presentation is: Acute, Chronic, Self-Limited, Previously Undiagnosed, Uncertain Prognosis, Complicated, Systemic Symptoms, and Threat to Life/Bodily Function  Patient presents with EMS after apparent fall.  Patient with head injury with fall.  Patient complains of right-sided pelvis and hip pain after fall.  Work-up reveals evidence of AKI, right pubic rami fracture.  These findings in addition to the patient's apparent deconditioned status require admission.  Hospitalist service made aware of case and will evaluate for same.  Additional history obtained:  Additional history obtained from Spouse External records from outside sources obtained and reviewed including prior ED visits and prior Inpatient records.    Lab Tests:  I ordered and personally interpreted labs.  The pertinent results include: CBC, CMP, COVID, flu, lactic acid, i-STAT, EtOH   Imaging Studies ordered:  I ordered imaging studies including CT head, CT maxillofacial, CT cervical spine plain films of chest, pelvis, right femur I independently visualized and interpreted obtained imaging which showed right pubic  rami fracture I agree with the radiologist interpretation.   Cardiac Monitoring:  The patient was maintained on a cardiac monitor.  I personally viewed and interpreted the cardiac monitor which showed an underlying rhythm of: NSR   Medicines  ordered:  I ordered medication including morphine, IV fluids, Tdap for pain, dehydration Reevaluation of the patient after these medicines showed that the patient: stayed the same   Problem List / ED Course:  Fall, AKI, Pubic rami fracture   Reevaluation:  After the interventions noted above, I reevaluated the patient and found that they have: stayed the same   Disposition:  After consideration of the diagnostic results and the patients response to treatment, I feel that the patent would benefit from admission.          Final Clinical Impression(s) / ED Diagnoses Final diagnoses:  Injury of head, initial encounter  Fall, initial encounter  Closed nondisplaced fracture of pelvis, unspecified part of pelvis, initial encounter [facility])    Rx / Effie Orders ED Discharge Orders     None         Valarie Merino, MD 02/11/22 2358

## 2022-02-11 NOTE — ED Notes (Signed)
Patient transported to CT 

## 2022-02-11 NOTE — Telephone Encounter (Signed)
Refill request for [facility]. Please advise.

## 2022-02-11 NOTE — H&P (Addendum)
History and Physical  Marco [name] GMW:[id number] DOB: 03/15/1934 DOA: 02/11/2022  Referring physician: Dr. Rodena Medin, EDP  PCP: Wanda Plump, MD  Outpatient Specialists: Medical oncology Patient coming from: Home  Chief Complaint: Fall   HPI: Marco [name] is a 88 y.o. male with medical history significant for prostate cancer on Xtandi, chronic R knee pain with joint effusion, R hip surgery in 2022, hyperlipidemia, hypothyroidism, vitamin B12 deficiency, recently treated for Herpes zoster, who presented to [facility] ED after a fall at home.  The patient fell backwards on the stairs.  Hit the back of his head.  Unclear if he lost consciousness prior or after his fall.  This is his 3rd fall in 3 weeks.  Per the son at bedside, the patient has been on Xtandi for almost a year and had no specific complaints.  For the past 3 weeks, after his diagnosis of herpes zoster, his appetite and condition had deteriorated.  He has been experiencing postherpetic pain, behind his left ear.  Due to recurrent falls his family stopped giving him the medication for neuropathy pain.  In the ED, work-up revealed negative CT head and negative CT cervical spine.  Due to pain in right hip, hip x-ray was obtained and showed right rami fracture.  TR[facility], hospitalist service, was asked to admit.  ED Course: T97.9.  BP 140/89, pulse 87, respiratory rate 15, O2 saturation 99% on room air.  Lab studies remarkable for positive COVID-19 screening test.  Serum sodium 130, glucose 170, BUN 28, creatinine 1.70 with baseline creatinine of 1.5 with GFR of 37.  Review of Systems: Review of systems as noted in the HPI. All other systems reviewed and are negative.   Past Medical History:  Diagnosis Date   Amaurosis fugax 2017   Diabetes mellitus    DJD (degenerative joint disease) 08/25/2013   Elevated PSA    Other and unspecified hyperlipidemia 04/25/2013   Prostate cancer [facility])    Past Surgical History:  Procedure Laterality  Date   CATARACT EXTRACTION     Bilaterally   TYMPANIC MEMBRANE REPAIR     R    Social History:  reports that he has never smoked. He has never used smokeless tobacco. He reports that he does not drink alcohol and does not use drugs.   No Known Allergies  Family History  Problem Relation Age of Onset   Other Mother        Unknown    Other Father        Unknown   Diabetes Neg Hx    CAD Neg Hx    Cancer Neg Hx       Prior to Admission medications   Medication Sig Start Date End Date Taking? Authorizing Provider  Accu-Chek Softclix Lancets lancets CHECK BLOOD SUGAR ONCE  DAILY AS DIRECTED 10/29/21   Wanda Plump, MD  Alcohol Swabs PADS Use to clean finger prior to testing blood sugars 03/13/21   Wanda Plump, MD  alendronate (FOSAMAX) 70 MG tablet TAKE 1 TABLET(70 MG) BY MOUTH 1 TIME A WEEK WITH A FULL GLASS OF WATER AND ON AN EMPTY STOMACH Patient not taking: Reported on 01/22/2022 04/30/21   Wanda Plump, MD  aspirin EC 81 MG EC tablet Take 1 tablet (81 mg total) by mouth daily. 11/22/16   Johnson, Clanford L, MD  atorvastatin (LIPITOR) 40 MG tablet Take 1 tablet (40 mg total) by mouth at bedtime. 10/30/21   Wanda Plump, MD  Blood  Glucose Calibration (ACCU-CHEK GUIDE CONTROL) LIQD Use as directed 12/24/20   Wanda Plump, MD  Blood Glucose Monitoring Suppl (ACCU-CHEK GUIDE) w/Device KIT Check blood sugars once daily 03/13/21   Wanda Plump, MD  Calcium Carb-Cholecalciferol (CALCIUM 500 + D) 500-125 MG-UNIT TABS Take 1 tablet by mouth daily.    [provider]  Cyanocobalamin (VITAMIN B-12 PO) Take 2,500 mcg by mouth daily.    [provider]  ezetimibe (ZETIA) 10 MG tablet Take 1 tablet (10 mg total) by mouth daily. 10/30/21   Wanda Plump, MD  glucose blood (ACCU-CHEK GUIDE) test strip USE TO CHECK BLOOD SUGAR  ONCE DAILY 10/29/21   Wanda Plump, MD  levothyroxine (SYNTHROID) 100 MCG tablet Take 1 tablet (100 mcg total) by mouth daily before breakfast. 02/11/22   Wanda Plump, MD   metFORMIN (GLUCOPHAGE) 500 MG tablet Take 1 tablet (500 mg total) by mouth daily. *take with a meal* 10/30/21   Wanda Plump, MD  Multiple Vitamin (MULTI-VITAMIN DAILY PO) Take 1 tablet by mouth daily.    [provider]  nitroGLYCERIN (NITROSTAT) 0.4 MG SL tablet Place 1 tablet (0.4 mg total) under the tongue every 5 (five) minutes x 3 doses as needed for chest pain. Patient not taking: Reported on 01/22/2022 05/14/21   Wanda Plump, MD  sitaGLIPtin (JANUVIA) 50 MG tablet Take 1 tablet (50 mg total) by mouth daily. 02/11/22   Wanda Plump, MD  sodium zirconium cyclosilicate (LOKELMA) 10 g PACK packet Mix 1 packet in 3 tablespoons of water and drink by mouth immediately every other day 02/11/22   Wanda Plump, MD  XTANDI 40 MG tablet Take 120 mg by mouth daily. 07/12/21   [provider]    Physical Exam: BP (!) 140/89   Pulse 91   Temp 97.8 F (36.6 C) (Oral)   Resp 11   SpO2 99%   General: 88 y.o. year-old male well developed well nourished in no acute distress.  Alert and oriented x3. Cardiovascular: Regular rate and rhythm with no rubs or gallops.  No thyromegaly or JVD noted.  No lower extremity edema. 2/4 pulses in all 4 extremities. Respiratory: Clear to auscultation with no wheezes or rales. Good inspiratory effort. Abdomen: Soft nontender nondistended with normal bowel sounds x4 quadrants. Muskuloskeletal: No cyanosis, clubbing or edema noted bilaterally.  R knee effusion Neuro: CN II-XII intact, strength, sensation, reflexes Skin: No ulcerative lesions noted or rashes Psychiatry: Judgement and insight appear normal. Mood is appropriate for condition and setting          Labs on Admission:  Basic Metabolic Panel: Recent Labs  Lab 02/11/22 1940 02/11/22 1951  NA 130* 130*  K 5.1 5.0  CL 95* 99  CO2 23  --   GLUCOSE 172* 170*  BUN 27* 28*  CREATININE 1.62* 1.70*  CALCIUM 10.2  --    Liver Function Tests: Recent Labs  Lab 02/11/22 1940  AST 37  ALT 21   ALKPHOS 79  BILITOT 1.0  PROT 7.5  ALBUMIN 3.7   No results for input(s): "LIPASE", "AMYLASE" in the last 168 hours. No results for input(s): "AMMONIA" in the last 168 hours. CBC: Recent Labs  Lab 02/11/22 1940 02/11/22 1951  WBC 8.3  --   NEUTROABS 6.4  --   HGB 11.1* 11.9*  HCT 34.3* 35.0*  MCV 86.8  --   PLT 248  --    Cardiac Enzymes: Recent Labs  Lab 02/11/22  1940  CKTOTAL 331    BNP (last 3 results) Recent Labs    02/02/22 1600  BNP 24.8    ProBNP (last 3 results) No results for input(s): "PROBNP" in the last 8760 hours.  CBG: No results for input(s): "GLUCAP" in the last 168 hours.  Radiological Exams on Admission: CT Head Wo Contrast  Result Date: 02/11/2022 CLINICAL DATA:  Trauma EXAM: CT HEAD WITHOUT CONTRAST CT MAXILLOFACIAL WITHOUT CONTRAST CT CERVICAL SPINE WITHOUT CONTRAST TECHNIQUE: Multidetector CT imaging of the head, cervical spine, and maxillofacial structures were performed using the standard protocol without intravenous contrast. Multiplanar CT image reconstructions of the cervical spine and maxillofacial structures were also generated. RADIATION DOSE REDUCTION: This exam was performed according to the departmental dose-optimization program which includes automated exposure control, adjustment of the mA and/or kV according to patient size and/or use of iterative reconstruction technique. COMPARISON:  CT head dated 02/02/2022 FINDINGS: CT HEAD FINDINGS Brain: No evidence of acute infarction, hemorrhage, hydrocephalus, extra-axial collection or mass lesion/mass effect. Global cortical atrophy. Mild subcortical white matter and periventricular small vessel ischemic changes. Vascular: No hyperdense vessel or unexpected calcification. Skull: Normal. Negative for fracture or focal lesion. Other: Small extracranial hematoma overlying the left parietal bone (series 3/image 17). CT MAXILLOFACIAL FINDINGS Osseous: No evidence of maxillofacial fracture. Nasal  bones are intact. Mandible is intact. Bilateral mandibular condyles are well-seated in the TMJs. Orbits: Bilateral orbits, including the globes and retroconal soft tissues, are within normal limits. Sinuses: The visualized paranasal sinuses are essentially clear. The mastoid air cells are unopacified. Soft tissues: Soft tissue swelling/laceration overlying the nasal bridge, left greater than right (series 3/image 50). CT CERVICAL SPINE FINDINGS Alignment: Normal cervical lordosis. Skull base and vertebrae: No acute fracture. No primary bone lesion or focal pathologic process. Soft tissues and spinal canal: No prevertebral fluid or swelling. No visible canal hematoma. Disc levels: Mild degenerative changes of the mid cervical spine. Spinal canal is patent. Upper chest: Visualized lung apices are clear. Other: Visualized thyroid is unremarkable. IMPRESSION: Small extracranial hematoma overlying the left parietal bone. No evidence of calvarial fracture. No evidence of acute intracranial abnormality. Atrophy with mild small vessel ischemic changes. Soft tissue swelling/laceration overlying the nasal bridge, left greater than right. No evidence of maxillofacial fracture. No traumatic injury to the cervical spine. Mild degenerative changes. Electronically Signed   By: Charline Bills M.D.   On: 02/11/2022 23:15   CT Cervical Spine Wo Contrast  Result Date: 02/11/2022 CLINICAL DATA:  Trauma EXAM: CT HEAD WITHOUT CONTRAST CT MAXILLOFACIAL WITHOUT CONTRAST CT CERVICAL SPINE WITHOUT CONTRAST TECHNIQUE: Multidetector CT imaging of the head, cervical spine, and maxillofacial structures were performed using the standard protocol without intravenous contrast. Multiplanar CT image reconstructions of the cervical spine and maxillofacial structures were also generated. RADIATION DOSE REDUCTION: This exam was performed according to the departmental dose-optimization program which includes automated exposure control, adjustment  of the mA and/or kV according to patient size and/or use of iterative reconstruction technique. COMPARISON:  CT head dated 02/02/2022 FINDINGS: CT HEAD FINDINGS Brain: No evidence of acute infarction, hemorrhage, hydrocephalus, extra-axial collection or mass lesion/mass effect. Global cortical atrophy. Mild subcortical white matter and periventricular small vessel ischemic changes. Vascular: No hyperdense vessel or unexpected calcification. Skull: Normal. Negative for fracture or focal lesion. Other: Small extracranial hematoma overlying the left parietal bone (series 3/image 17). CT MAXILLOFACIAL FINDINGS Osseous: No evidence of maxillofacial fracture. Nasal bones are intact. Mandible is intact. Bilateral mandibular condyles are well-seated in the TMJs.  Orbits: Bilateral orbits, including the globes and retroconal soft tissues, are within normal limits. Sinuses: The visualized paranasal sinuses are essentially clear. The mastoid air cells are unopacified. Soft tissues: Soft tissue swelling/laceration overlying the nasal bridge, left greater than right (series 3/image 50). CT CERVICAL SPINE FINDINGS Alignment: Normal cervical lordosis. Skull base and vertebrae: No acute fracture. No primary bone lesion or focal pathologic process. Soft tissues and spinal canal: No prevertebral fluid or swelling. No visible canal hematoma. Disc levels: Mild degenerative changes of the mid cervical spine. Spinal canal is patent. Upper chest: Visualized lung apices are clear. Other: Visualized thyroid is unremarkable. IMPRESSION: Small extracranial hematoma overlying the left parietal bone. No evidence of calvarial fracture. No evidence of acute intracranial abnormality. Atrophy with mild small vessel ischemic changes. Soft tissue swelling/laceration overlying the nasal bridge, left greater than right. No evidence of maxillofacial fracture. No traumatic injury to the cervical spine. Mild degenerative changes. Electronically Signed    By: Charline Bills M.D.   On: 02/11/2022 23:15   CT Maxillofacial WO CM  Result Date: 02/11/2022 CLINICAL DATA:  Trauma EXAM: CT HEAD WITHOUT CONTRAST CT MAXILLOFACIAL WITHOUT CONTRAST CT CERVICAL SPINE WITHOUT CONTRAST TECHNIQUE: Multidetector CT imaging of the head, cervical spine, and maxillofacial structures were performed using the standard protocol without intravenous contrast. Multiplanar CT image reconstructions of the cervical spine and maxillofacial structures were also generated. RADIATION DOSE REDUCTION: This exam was performed according to the departmental dose-optimization program which includes automated exposure control, adjustment of the mA and/or kV according to patient size and/or use of iterative reconstruction technique. COMPARISON:  CT head dated 02/02/2022 FINDINGS: CT HEAD FINDINGS Brain: No evidence of acute infarction, hemorrhage, hydrocephalus, extra-axial collection or mass lesion/mass effect. Global cortical atrophy. Mild subcortical white matter and periventricular small vessel ischemic changes. Vascular: No hyperdense vessel or unexpected calcification. Skull: Normal. Negative for fracture or focal lesion. Other: Small extracranial hematoma overlying the left parietal bone (series 3/image 17). CT MAXILLOFACIAL FINDINGS Osseous: No evidence of maxillofacial fracture. Nasal bones are intact. Mandible is intact. Bilateral mandibular condyles are well-seated in the TMJs. Orbits: Bilateral orbits, including the globes and retroconal soft tissues, are within normal limits. Sinuses: The visualized paranasal sinuses are essentially clear. The mastoid air cells are unopacified. Soft tissues: Soft tissue swelling/laceration overlying the nasal bridge, left greater than right (series 3/image 50). CT CERVICAL SPINE FINDINGS Alignment: Normal cervical lordosis. Skull base and vertebrae: No acute fracture. No primary bone lesion or focal pathologic process. Soft tissues and spinal canal: No  prevertebral fluid or swelling. No visible canal hematoma. Disc levels: Mild degenerative changes of the mid cervical spine. Spinal canal is patent. Upper chest: Visualized lung apices are clear. Other: Visualized thyroid is unremarkable. IMPRESSION: Small extracranial hematoma overlying the left parietal bone. No evidence of calvarial fracture. No evidence of acute intracranial abnormality. Atrophy with mild small vessel ischemic changes. Soft tissue swelling/laceration overlying the nasal bridge, left greater than right. No evidence of maxillofacial fracture. No traumatic injury to the cervical spine. Mild degenerative changes. Electronically Signed   By: Charline Bills M.D.   On: 02/11/2022 23:15   DG Chest 1 View  Result Date: 02/11/2022 CLINICAL DATA:  Fall with pain in right leg. EXAM: CHEST  1 VIEW COMPARISON:  Radiograph 02/02/2022 FINDINGS: Lower lung volumes from prior exam. Stable heart size and mediastinal contours allowing for differences in technique. No pneumothorax, large pleural effusion or focal airspace disease. No acute osseous findings. IMPRESSION: Low lung volumes without acute  abnormality. Electronically Signed   By: Narda Rutherford M.D.   On: 02/11/2022 20:31   DG Pelvis 1-2 Views  Result Date: 02/11/2022 CLINICAL DATA:  Post fall with right leg pain. EXAM: PELVIS - 1-2 VIEW COMPARISON:  None Available. FINDINGS: There is slight cortical offset of the right superior pubic ramus which may represent a nondisplaced fracture. No convincing inferior pubic ramus fracture. Femoral head is well seated in the acetabulum. Surgical hardware in the right proximal femur is intact. Pubic symphysis and sacroiliac joints are congruent. IMPRESSION: Findings suspicious for right superior pubic ramus fracture. Electronically Signed   By: Narda Rutherford M.D.   On: 02/11/2022 20:30   DG Femur Min 2 Views Right  Result Date: 02/11/2022 CLINICAL DATA:  Fall, pain in right leg. EXAM: RIGHT FEMUR  2 VIEWS COMPARISON:  Radiograph 10/12/2019 FINDINGS: Intramedullary nail with trans trochanteric and distal locking screw fixation. The previous intertrochanteric femur fracture has healed. The trans trochanteric screws again project into the lateral soft tissues by approximately 15 mm. There is no acute or periprosthetic fracture. The femoral head is seated in the acetabulum. Degenerative change of the hip and knee. IMPRESSION: 1. No acute fracture of the right femur. 2. Intramedullary nail with trans trochanteric and distal locking screw fixation, healed intertrochanteric fracture. Electronically Signed   By: Narda Rutherford M.D.   On: 02/11/2022 20:28    EKG: I independently viewed the EKG done and my findings are as followed: Sinus tachycardia rate of 103.  Nonspecific ST-T changes.  QTc 415.  Assessment/Plan Present on Admission:  Fall  Principal Problem:   Fall  Fall, unclear etiology Possible syncope Unclear if the patient lost consciousness. Obtain orthostatic vital signs Obtain 2D echo to rule out any structural cardiac abnormalities. Telemetry monitoring. Hold off home Xtandi for prostate cancer as it can have falls as potential side effects. PT/OT assessment in the morning Fall precautions  AKI on CKD 3B, suspect prerenal in the setting of dehydration from poor intake. Baseline creatinine appears to be 1.4 with GFR 42 Presented with creatinine of 1.7 with GFR in the 30s. Monitor urine output with strict I's and O's Avoid nephrotoxic agents and dehydration. Gentle IV fluid hydration NS at 75 cc/h x 1 day.  Right superior pubic fracture secondary to fall, seen on x-ray. Supportive care Analgesics as needed with bowel regimen. Recommend orthopedic surgery consultation in the morning to guide PT OT evaluation and therapies. TOC consulted to assist with DC planning.  The patient's son is open to rehab facility placement prior to returning home.  Recently treated herpes zoster  with postherpetic pain Skin lesions have crusted Supportive care  Chronic right knee pain with effusion Recommend orthopedic surgery consultation in the morning Supportive care As needed analgesics  Type 2 diabetes with hyperglycemia Last hemoglobin A1c 7.2 in August 2023 Heart healthy carb modified diet Start insulin sliding scale.  Hyperlipidemia Resume home regimen  Chronic diastolic CHF Last 2D echo done in 2017 showed LVEF 60 to 65% with grade 1 diastolic dysfunction Euvolemic on exam Follow 2D echo Start Strict I's and O's and daily weight  Hypothyroidism Resume home levothyroxine  Deconditioning/physical debility At baseline ambulates with a cane. PT OT to assess with orthopedic surgery's guidance. Fall precautions  Positive COVID-19 screening test Tested positive for COVID-19, 2 weeks ago. COVID-19 screening test on 02/11/2022 positive. Obtain inflammatory markers in the morning Cycle threshold count greater than 47 represents old COVID infection.    Critical care time: 68  minutes.     DVT prophylaxis: Subcu Lovenox to start on 02/12/2022  Code Status: Full code  Family Communication: Updated the patient's son at bedside  Disposition Plan: Admit to telemetry medical unit  Consults called: None  Admission status: Inpatient status.   Status is: Inpatient The patient requires at least 2 midnights for further evaluation and treatment of loculation.   Darlin Drop MD [facility] Pager [phone]  If 7PM-7AM, please contact night-coverage [email or web address] Password [email or web address]  02/11/2022, 11:52 PM

## 2022-02-12 ENCOUNTER — Inpatient Hospital Stay (HOSPITAL_COMMUNITY): Payer: Medicare Other

## 2022-02-12 ENCOUNTER — Telehealth: Payer: Self-pay

## 2022-02-12 DIAGNOSIS — R55 Syncope and collapse: Secondary | ICD-10-CM

## 2022-02-12 LAB — RENAL FUNCTION PANEL
Albumin: 2.8 g/dL — ABNORMAL LOW (ref 3.5–5.0)
Anion gap: 10 (ref 5–15)
BUN: 23 mg/dL (ref 8–23)
CO2: 24 mmol/L (ref 22–32)
Calcium: 9.3 mg/dL (ref 8.9–10.3)
Chloride: 99 mmol/L (ref 98–111)
Creatinine, Ser: 1.54 mg/dL — ABNORMAL HIGH (ref 0.61–1.24)
GFR, Estimated: 44 mL/min — ABNORMAL LOW (ref >60)
Glucose, Bld: 176 mg/dL — ABNORMAL HIGH (ref 70–99)
Phosphorus: 3.9 mg/dL (ref 2.5–4.6)
Potassium: 4.6 mmol/L (ref 3.5–5.1)
Sodium: 133 mmol/L — ABNORMAL LOW (ref 135–145)

## 2022-02-12 LAB — CBC
HCT: 26.5 % — ABNORMAL LOW (ref 39.0–52.0)
HCT: 31.2 % — ABNORMAL LOW (ref 39.0–52.0)
Hemoglobin: 8.8 g/dL — ABNORMAL LOW (ref 13.0–17.0)
Hemoglobin: 9.9 g/dL — ABNORMAL LOW (ref 13.0–17.0)
MCH: 28 pg (ref 26.0–34.0)
MCH: 28.5 pg (ref 26.0–34.0)
MCHC: 31.7 g/dL (ref 30.0–36.0)
MCHC: 33.2 g/dL (ref 30.0–36.0)
MCV: 85.8 fL (ref 80.0–100.0)
MCV: 88.4 fL (ref 80.0–100.0)
Platelets: 199 10*3/uL (ref 150–400)
Platelets: 218 10*3/uL (ref 150–400)
RBC: 3.09 MIL/uL — ABNORMAL LOW (ref 4.22–5.81)
RBC: 3.53 MIL/uL — ABNORMAL LOW (ref 4.22–5.81)
RDW: 14.9 % (ref 11.5–15.5)
RDW: 14.9 % (ref 11.5–15.5)
WBC: 5.6 10*3/uL (ref 4.0–10.5)
WBC: 6.2 10*3/uL (ref 4.0–10.5)
nRBC: 0 % (ref 0.0–0.2)
nRBC: 0 % (ref 0.0–0.2)

## 2022-02-12 LAB — ECHOCARDIOGRAM COMPLETE: Area-P 1/2: 2.95 cm2

## 2022-02-12 LAB — CBG MONITORING, ED
Glucose-Capillary: 155 mg/dL — ABNORMAL HIGH (ref 70–99)
Glucose-Capillary: 167 mg/dL — ABNORMAL HIGH (ref 70–99)
Glucose-Capillary: 200 mg/dL — ABNORMAL HIGH (ref 70–99)

## 2022-02-12 LAB — D-DIMER, QUANTITATIVE: D-Dimer, Quant: 9.3 ug/mL-FEU — ABNORMAL HIGH (ref 0.00–0.50)

## 2022-02-12 LAB — CREATININE, SERUM
Creatinine, Ser: 1.54 mg/dL — ABNORMAL HIGH (ref 0.61–1.24)
GFR, Estimated: 44 mL/min — ABNORMAL LOW (ref >60)

## 2022-02-12 LAB — GLUCOSE, CAPILLARY: Glucose-Capillary: 210 mg/dL — ABNORMAL HIGH (ref 70–99)

## 2022-02-12 LAB — MAGNESIUM: Magnesium: 1.9 mg/dL (ref 1.7–2.4)

## 2022-02-12 LAB — C-REACTIVE PROTEIN: CRP: 0.9 mg/dL (ref <1.0)

## 2022-02-12 MED ORDER — IOHEXOL 350 MG/ML SOLN
60.0000 mL | Freq: Once | INTRAVENOUS | Status: AC | PRN
  Administered 2022-02-12: 60 mL via INTRAVENOUS

## 2022-02-12 MED ORDER — SODIUM CHLORIDE 0.9 % IV SOLN: INTRAVENOUS | Status: DC

## 2022-02-12 MED ORDER — GABAPENTIN 100 MG PO CAPS
100.0000 mg | ORAL_CAPSULE | Freq: Two times a day (BID) | ORAL | Status: DC
  Administered 2022-02-12 – 2022-02-14 (×7): 100 mg via ORAL
  Filled 2022-02-12 (×8): qty 1

## 2022-02-12 MED ORDER — INSULIN ASPART 100 UNIT/ML IJ SOLN
0.0000 [IU] | Freq: Every day | INTRAMUSCULAR | Status: DC
  Administered 2022-02-12: 2 [IU] via SUBCUTANEOUS

## 2022-02-12 MED ORDER — LINAGLIPTIN 5 MG PO TABS
5.0000 mg | ORAL_TABLET | Freq: Every day | ORAL | Status: DC
  Administered 2022-02-12 – 2022-02-15 (×4): 5 mg via ORAL
  Filled 2022-02-12 (×4): qty 1

## 2022-02-12 MED ORDER — OXYCODONE HCL 5 MG PO TABS
5.0000 mg | ORAL_TABLET | ORAL | Status: DC | PRN
  Administered 2022-02-13: 5 mg via ORAL
  Filled 2022-02-12: qty 1

## 2022-02-12 MED ORDER — METHOCARBAMOL 500 MG PO TABS
500.0000 mg | ORAL_TABLET | ORAL | Status: AC
  Administered 2022-02-12: 500 mg via ORAL
  Filled 2022-02-12: qty 1

## 2022-02-12 MED ORDER — SENNOSIDES-DOCUSATE SODIUM 8.6-50 MG PO TABS
1.0000 | ORAL_TABLET | Freq: Every day | ORAL | Status: DC
  Administered 2022-02-12 – 2022-02-14 (×4): 1 via ORAL
  Filled 2022-02-12 (×4): qty 1

## 2022-02-12 MED ORDER — PERFLUTREN LIPID MICROSPHERE
1.0000 mL | INTRAVENOUS | Status: AC | PRN
  Administered 2022-02-12: 4 mL via INTRAVENOUS

## 2022-02-12 MED ORDER — PROCHLORPERAZINE EDISYLATE 10 MG/2ML IJ SOLN: 5.0000 mg | Freq: Four times a day (QID) | INTRAMUSCULAR | Status: DC | PRN

## 2022-02-12 MED ORDER — POLYETHYLENE GLYCOL 3350 17 G PO PACK: 17.0000 g | PACK | Freq: Every day | ORAL | Status: DC | PRN

## 2022-02-12 MED ORDER — ACETAMINOPHEN 325 MG PO TABS: 650.0000 mg | ORAL_TABLET | Freq: Four times a day (QID) | ORAL | Status: DC | PRN

## 2022-02-12 MED ORDER — HYDROMORPHONE HCL 1 MG/ML IJ SOLN
0.5000 mg | INTRAMUSCULAR | Status: DC | PRN
  Administered 2022-02-13 – 2022-02-14 (×2): 0.5 mg via INTRAVENOUS
  Filled 2022-02-12 (×2): qty 0.5

## 2022-02-12 MED ORDER — INSULIN ASPART 100 UNIT/ML IJ SOLN
0.0000 [IU] | Freq: Three times a day (TID) | INTRAMUSCULAR | Status: DC
  Administered 2022-02-12 – 2022-02-14 (×6): 2 [IU] via SUBCUTANEOUS
  Administered 2022-02-14: 1 [IU] via SUBCUTANEOUS
  Administered 2022-02-14: 2 [IU] via SUBCUTANEOUS
  Administered 2022-02-15: 3 [IU] via SUBCUTANEOUS
  Administered 2022-02-15: 2 [IU] via SUBCUTANEOUS
  Administered 2022-02-15: 1 [IU] via SUBCUTANEOUS

## 2022-02-12 NOTE — Telephone Encounter (Signed)
Pt w/ fall, fx of pelvis. Currently admitted.

## 2022-02-12 NOTE — Telephone Encounter (Signed)
Patient's daughter called stating her dad fell and he has been waiting for a bed for the past 24 hours and she is highly upset. Explained to pt's daughter that unfortunately there is nothing we can do to speed up that process and that a message would be sent to Dr. Larose Kells to update him. Pt's daughter was highly upset stating her dad has been sick and the medicine given to him did not help him, which led to this fall. She also stated that when they tried to call to get him seen, he was denied an appointment by Dr. Larose Kells. She continued to say she was highly upset and wanted a number that she could call to complain. Patient experience number was given to pt's daughter and patient said thank you and proceed to hang up.

## 2022-02-12 NOTE — Progress Notes (Addendum)
PROGRESS NOTE   Marco [name]  PNT:[id number] DOB: 1933-09-09 DOA: 02/11/2022 PCP: Colon Branch, MD  Brief Narrative:  88 year old community dwelling.male CAD/05/2016 stress test indeterminant Follows for amaurosis fugax Dr Bing Plume 10/2015 CKD stage II GFR baseline 1.5  Elevated PSA prostate nodule on Xtandi Postherpetic neuralgia on gabapentin until recently  Recent diagnosis of COVID 9/19 [facility]-recovered completely. Subsequently seen again [facility] ED 10/1 syncope fell onto his back was initially bradycardic orthostatic position change--resuscitated with saline-potassium is slightly high, troponin negative, BNP negative  Visited with PCP 10/3 and was taken off gabapentin, recommended Lokelma and recommended fluid hydration given orthostasis when in the ED  Re-presented again 02/11/2022 falling backwards down 6 stairs after getting dizzy + LOC,?  Unconscious, he was oriented on arrival-was found down for 2 to 3 hours after fall  Work-up in ED negative CT head negative CT cervical spine x-ray right hip = right ramus fracture Sodium 130 BUN/creatinine 28/1.7, troponin 10 CRP 0.9, hemoglobin baseline 10 on arrival 8.8 D-dimer 9.3  Hospital-Problem based course  Syncope Dimer elevated 9.3-underlying history of prostate cancer--obtain CT chest dizziness SOB can also be caused by this Keep on telemetry today-continue fluids 75 cc/H-gabapentin less likely to be etiology as did not make a change to his symptoms according to son. Will also ask for PT vestibular eval-patient unable to cooperate with my exam-lastly we will get orthostatics 3 times daily for 1 day as I feel that this is the most likely cause  Right ramus pubis fracture Will need PT eval as well as a vestibular eval Previously using only a cane but has been using a walker at least for a year according to son-he is unable to straight leg raise Pain control Tylenol first choice, oxycodone 5 every 4 as needed for  moderate/breakthrough, Dilaudid 0.5 IV every 4 as needed for severe pain  Recent COVID Fully recovered this was in 9/19-outside of 10-day isolation so does not need precautions  Prostate cancer on Xtandi No records available-Xtandi in and of itself does have a 9 to 12% risk of dizziness--we will hold that-D/W Dr. Vikki Ports of urology and this is not a common side effect (usually more hot flashes)  DM TY 2 Continue Januvia 50 daily--hold metformin given Creatinine 1.5 and decide on this prior to discharge Continue sliding scale at this time and regular diet  Prior amaurosis fugax 2017 Imaging of head performed 10/1 showed no specific findings  Prior hyperkalemia CKD 3 AA No acute component at this time creatinine is better than before-would hold Lokelma at this time Continue hydration for now at 62 cc/8  Prior nonrevealing stress test 2018 Consider discussion outpatient discontinuation of aspirin  DVT prophylaxis: Lovenox Code Status: Full Family Communication: Discussed with patient's son, Sam Disposition:  Status is: Inpatient Remains inpatient appropriate because:   Need for work-up evaluation of causes of syncope   Consultants:  None  Procedures:   Antimicrobials: No   Subjective: Awake coherent--pain in back of head, pain in right lower extremity with straight leg raise No chest pain no fever Seems to defer to son regarding history but is coherent and alert  Objective: Vitals:   02/12/22 0300 02/12/22 0400 02/12/22 0500 02/12/22 0600  BP: 123/77 97/63 (!) 91/57 99/63  Pulse: 89 85 82 81  Resp: '14 15 15 14  '$ Temp:      TempSrc:      SpO2: 99% 99% 98% 98%    Intake/Output Summary (Last 24  hours) at 02/12/2022 0646 Last data filed at 02/11/2022 2219 Gross per 24 hour  Intake 500 ml  Output --  Net 500 ml   There were no vitals filed for this visit.  Examination:  EOMI NCAT no focal deficit Arcus senilis is present he does have a bruise to the back  of the head S1-S2 no murmur no rub or gallop Chest is clinically clear no added sound no wheeze no rales no rhonchi Abdomen is soft no rebound Straight leg raise on the right side is limited Dorsi and plantarflexion are intact No lower extremity swelling-no unilateral swelling and no cord palpable in the posterior legs NEURO is intact grossly but he is somewhat weak  Data Reviewed: personally reviewed   CBC    Component Value Date/Time   WBC 5.6 02/12/2022 0500   RBC 3.09 (L) 02/12/2022 0500   HGB 8.8 (L) 02/12/2022 0500   HCT 26.5 (L) 02/12/2022 0500   PLT 199 02/12/2022 0500   MCV 85.8 02/12/2022 0500   MCH 28.5 02/12/2022 0500   MCHC 33.2 02/12/2022 0500   RDW 14.9 02/12/2022 0500   LYMPHSABS 1.0 02/11/2022 1940   MONOABS 0.7 02/11/2022 1940   EOSABS 0.0 02/11/2022 1940   BASOSABS 0.0 02/11/2022 1940      Latest Ref Rng & Units 02/12/2022    5:00 AM 02/11/2022   11:54 PM 02/11/2022    7:51 PM  CMP  Glucose 70 - 99 mg/dL 176   170   BUN 8 - 23 mg/dL 23   28   Creatinine 0.61 - 1.24 mg/dL 1.54  1.54  1.70   Sodium 135 - 145 mmol/L 133   130   Potassium 3.5 - 5.1 mmol/L 4.6   5.0   Chloride 98 - 111 mmol/L 99   99   CO2 22 - 32 mmol/L 24     Calcium 8.9 - 10.3 mg/dL 9.3        Radiology Studies: CT Head Wo Contrast  Result Date: 02/11/2022 CLINICAL DATA:  Trauma EXAM: CT HEAD WITHOUT CONTRAST CT MAXILLOFACIAL WITHOUT CONTRAST CT CERVICAL SPINE WITHOUT CONTRAST TECHNIQUE: Multidetector CT imaging of the head, cervical spine, and maxillofacial structures were performed using the standard protocol without intravenous contrast. Multiplanar CT image reconstructions of the cervical spine and maxillofacial structures were also generated. RADIATION DOSE REDUCTION: This exam was performed according to the departmental dose-optimization program which includes automated exposure control, adjustment of the mA and/or kV according to patient size and/or use of iterative  reconstruction technique. COMPARISON:  CT head dated 02/02/2022 FINDINGS: CT HEAD FINDINGS Brain: No evidence of acute infarction, hemorrhage, hydrocephalus, extra-axial collection or mass lesion/mass effect. Global cortical atrophy. Mild subcortical white matter and periventricular small vessel ischemic changes. Vascular: No hyperdense vessel or unexpected calcification. Skull: Normal. Negative for fracture or focal lesion. Other: Small extracranial hematoma overlying the left parietal bone (series 3/image 17). CT MAXILLOFACIAL FINDINGS Osseous: No evidence of maxillofacial fracture. Nasal bones are intact. Mandible is intact. Bilateral mandibular condyles are well-seated in the TMJs. Orbits: Bilateral orbits, including the globes and retroconal soft tissues, are within normal limits. Sinuses: The visualized paranasal sinuses are essentially clear. The mastoid air cells are unopacified. Soft tissues: Soft tissue swelling/laceration overlying the nasal bridge, left greater than right (series 3/image 50). CT CERVICAL SPINE FINDINGS Alignment: Normal cervical lordosis. Skull base and vertebrae: No acute fracture. No primary bone lesion or focal pathologic process. Soft tissues and spinal canal: No prevertebral fluid or swelling. No  visible canal hematoma. Disc levels: Mild degenerative changes of the mid cervical spine. Spinal canal is patent. Upper chest: Visualized lung apices are clear. Other: Visualized thyroid is unremarkable. IMPRESSION: Small extracranial hematoma overlying the left parietal bone. No evidence of calvarial fracture. No evidence of acute intracranial abnormality. Atrophy with mild small vessel ischemic changes. Soft tissue swelling/laceration overlying the nasal bridge, left greater than right. No evidence of maxillofacial fracture. No traumatic injury to the cervical spine. Mild degenerative changes. Electronically Signed   By: Julian Hy M.D.   On: 02/11/2022 23:15   CT Cervical Spine  Wo Contrast  Result Date: 02/11/2022 CLINICAL DATA:  Trauma EXAM: CT HEAD WITHOUT CONTRAST CT MAXILLOFACIAL WITHOUT CONTRAST CT CERVICAL SPINE WITHOUT CONTRAST TECHNIQUE: Multidetector CT imaging of the head, cervical spine, and maxillofacial structures were performed using the standard protocol without intravenous contrast. Multiplanar CT image reconstructions of the cervical spine and maxillofacial structures were also generated. RADIATION DOSE REDUCTION: This exam was performed according to the departmental dose-optimization program which includes automated exposure control, adjustment of the mA and/or kV according to patient size and/or use of iterative reconstruction technique. COMPARISON:  CT head dated 02/02/2022 FINDINGS: CT HEAD FINDINGS Brain: No evidence of acute infarction, hemorrhage, hydrocephalus, extra-axial collection or mass lesion/mass effect. Global cortical atrophy. Mild subcortical white matter and periventricular small vessel ischemic changes. Vascular: No hyperdense vessel or unexpected calcification. Skull: Normal. Negative for fracture or focal lesion. Other: Small extracranial hematoma overlying the left parietal bone (series 3/image 17). CT MAXILLOFACIAL FINDINGS Osseous: No evidence of maxillofacial fracture. Nasal bones are intact. Mandible is intact. Bilateral mandibular condyles are well-seated in the TMJs. Orbits: Bilateral orbits, including the globes and retroconal soft tissues, are within normal limits. Sinuses: The visualized paranasal sinuses are essentially clear. The mastoid air cells are unopacified. Soft tissues: Soft tissue swelling/laceration overlying the nasal bridge, left greater than right (series 3/image 50). CT CERVICAL SPINE FINDINGS Alignment: Normal cervical lordosis. Skull base and vertebrae: No acute fracture. No primary bone lesion or focal pathologic process. Soft tissues and spinal canal: No prevertebral fluid or swelling. No visible canal hematoma. Disc  levels: Mild degenerative changes of the mid cervical spine. Spinal canal is patent. Upper chest: Visualized lung apices are clear. Other: Visualized thyroid is unremarkable. IMPRESSION: Small extracranial hematoma overlying the left parietal bone. No evidence of calvarial fracture. No evidence of acute intracranial abnormality. Atrophy with mild small vessel ischemic changes. Soft tissue swelling/laceration overlying the nasal bridge, left greater than right. No evidence of maxillofacial fracture. No traumatic injury to the cervical spine. Mild degenerative changes. Electronically Signed   By: Julian Hy M.D.   On: 02/11/2022 23:15   CT Maxillofacial WO CM  Result Date: 02/11/2022 CLINICAL DATA:  Trauma EXAM: CT HEAD WITHOUT CONTRAST CT MAXILLOFACIAL WITHOUT CONTRAST CT CERVICAL SPINE WITHOUT CONTRAST TECHNIQUE: Multidetector CT imaging of the head, cervical spine, and maxillofacial structures were performed using the standard protocol without intravenous contrast. Multiplanar CT image reconstructions of the cervical spine and maxillofacial structures were also generated. RADIATION DOSE REDUCTION: This exam was performed according to the departmental dose-optimization program which includes automated exposure control, adjustment of the mA and/or kV according to patient size and/or use of iterative reconstruction technique. COMPARISON:  CT head dated 02/02/2022 FINDINGS: CT HEAD FINDINGS Brain: No evidence of acute infarction, hemorrhage, hydrocephalus, extra-axial collection or mass lesion/mass effect. Global cortical atrophy. Mild subcortical white matter and periventricular small vessel ischemic changes. Vascular: No hyperdense vessel or unexpected calcification. Skull:  Normal. Negative for fracture or focal lesion. Other: Small extracranial hematoma overlying the left parietal bone (series 3/image 17). CT MAXILLOFACIAL FINDINGS Osseous: No evidence of maxillofacial fracture. Nasal bones are intact.  Mandible is intact. Bilateral mandibular condyles are well-seated in the TMJs. Orbits: Bilateral orbits, including the globes and retroconal soft tissues, are within normal limits. Sinuses: The visualized paranasal sinuses are essentially clear. The mastoid air cells are unopacified. Soft tissues: Soft tissue swelling/laceration overlying the nasal bridge, left greater than right (series 3/image 50). CT CERVICAL SPINE FINDINGS Alignment: Normal cervical lordosis. Skull base and vertebrae: No acute fracture. No primary bone lesion or focal pathologic process. Soft tissues and spinal canal: No prevertebral fluid or swelling. No visible canal hematoma. Disc levels: Mild degenerative changes of the mid cervical spine. Spinal canal is patent. Upper chest: Visualized lung apices are clear. Other: Visualized thyroid is unremarkable. IMPRESSION: Small extracranial hematoma overlying the left parietal bone. No evidence of calvarial fracture. No evidence of acute intracranial abnormality. Atrophy with mild small vessel ischemic changes. Soft tissue swelling/laceration overlying the nasal bridge, left greater than right. No evidence of maxillofacial fracture. No traumatic injury to the cervical spine. Mild degenerative changes. Electronically Signed   By: Julian Hy M.D.   On: 02/11/2022 23:15   DG Chest 1 View  Result Date: 02/11/2022 CLINICAL DATA:  Fall with pain in right leg. EXAM: CHEST  1 VIEW COMPARISON:  Radiograph 02/02/2022 FINDINGS: Lower lung volumes from prior exam. Stable heart size and mediastinal contours allowing for differences in technique. No pneumothorax, large pleural effusion or focal airspace disease. No acute osseous findings. IMPRESSION: Low lung volumes without acute abnormality. Electronically Signed   By: Keith Rake M.D.   On: 02/11/2022 20:31   DG Pelvis 1-2 Views  Result Date: 02/11/2022 CLINICAL DATA:  Post fall with right leg pain. EXAM: PELVIS - 1-2 VIEW COMPARISON:   None Available. FINDINGS: There is slight cortical offset of the right superior pubic ramus which may represent a nondisplaced fracture. No convincing inferior pubic ramus fracture. Femoral head is well seated in the acetabulum. Surgical hardware in the right proximal femur is intact. Pubic symphysis and sacroiliac joints are congruent. IMPRESSION: Findings suspicious for right superior pubic ramus fracture. Electronically Signed   By: Keith Rake M.D.   On: 02/11/2022 20:30   DG Femur Min 2 Views Right  Result Date: 02/11/2022 CLINICAL DATA:  Fall, pain in right leg. EXAM: RIGHT FEMUR 2 VIEWS COMPARISON:  Radiograph 10/12/2019 FINDINGS: Intramedullary nail with trans trochanteric and distal locking screw fixation. The previous intertrochanteric femur fracture has healed. The trans trochanteric screws again project into the lateral soft tissues by approximately 15 mm. There is no acute or periprosthetic fracture. The femoral head is seated in the acetabulum. Degenerative change of the hip and knee. IMPRESSION: 1. No acute fracture of the right femur. 2. Intramedullary nail with trans trochanteric and distal locking screw fixation, healed intertrochanteric fracture. Electronically Signed   By: Keith Rake M.D.   On: 02/11/2022 20:28     Scheduled Meds:  atorvastatin  40 mg Oral QHS   vitamin B-12  2,500 mcg Oral Daily   enoxaparin (LOVENOX) injection  30 mg Subcutaneous Daily   ezetimibe  10 mg Oral Daily   gabapentin  100 mg Oral BID   insulin aspart  0-5 Units Subcutaneous QHS   insulin aspart  0-9 Units Subcutaneous TID WC   levothyroxine  100 mcg Oral QAC breakfast   multivitamin with minerals  1 tablet Oral Daily   senna-docusate  1 tablet Oral QHS   Continuous Infusions:  sodium chloride 75 mL/hr at 02/12/22 0040     LOS: 1 day   Time spent: Rico, MD [facility] To contact the attending provider between 7A-7P or the covering provider during after  hours 7P-7A, please log into the web site [email or web address] and access using universal [place] password for that web site. If you do not have the password, please call the hospital operator.  02/12/2022, 6:46 AM

## 2022-02-12 NOTE — Progress Notes (Signed)
  Echocardiogram 2D Echocardiogram has been performed.  Wynelle Link 02/12/2022, 4:19 PM

## 2022-02-12 NOTE — Telephone Encounter (Signed)
Recommend to discuss with attending physician. Consultation with palliative or hospice care and a social worker would be excellent.

## 2022-02-12 NOTE — Telephone Encounter (Signed)
LMOM asking for call back.  

## 2022-02-12 NOTE — Telephone Encounter (Signed)
Initial Comment Caller states his father fell down yesterday. They took him to the ER. They are needing to speak to someone in the office about his diagnoses.They are currently at the hospital in the ER. Translation No Disp. Time Eilene Ghazi Time) Disposition Final User 02/12/2022 1:48:47 PM Attempt made - message left Ottis Stain, RN, [place] 02/12/2022 2:02:15 PM Attempt made - message left Ottis Stain, RN, [place] 02/12/2022 2:19:17 PM FINAL ATTEMPT MADE - message left Ottis Stain RN, [place] 02/12/2022 2:19:27 PM Send to RN Final Attempt Tamala Ser, RN, [place] 02/12/2022 2:21:03 PM Send To RN Personal Standifer, RN, [place] 02/12/2022 2:24:05 PM FINAL ATTEMPT MADE - message left Carmon, RN, L[place]y Gauss 02/12/2022 2:24:23 PM FINAL ATTEMPT MADE - message left Yes Thad Ranger, RN, Langley Gauss Final Disposition 02/12/2022 2:24:23 PM FINAL ATTEMPT MADE - message left Yes Carmon, RN, Langley Gauss

## 2022-02-12 NOTE — Telephone Encounter (Addendum)
pt's son requesting call back- Marco [name]- [phone].

## 2022-02-12 NOTE — ED Notes (Signed)
Pt mucosa is very dry.  Offered meal tray and something to drink.. pt stated that he didn't want anything that his son was bringing him something to eat.  Pt was able to eat some fruit from his lunch tray earlier and sit up on the side of the bed at lunch.  Currently, pt did not want to sit up.

## 2022-02-12 NOTE — Progress Notes (Signed)
OT Cancellation Note  Patient Details Name: Marco [name] MRN: [id number] DOB: 03-08-1934   Cancelled Treatment:    Reason Eval/Treat Not Completed: Other (comment) Pt with acute right pubic ramus fracture, pending ortho consult. Will hold OT eval at this time and follow up.   Layla Maw 02/12/2022, 7:55 AM

## 2022-02-12 NOTE — ED Notes (Signed)
MD called regarding pt bed status.  I updated the MD that the bed is still requested and there is no assigned bed at this time

## 2022-02-13 ENCOUNTER — Inpatient Hospital Stay (HOSPITAL_COMMUNITY): Payer: Medicare Other

## 2022-02-13 LAB — CBC WITH DIFFERENTIAL/PLATELET
Abs Immature Granulocytes: 0.05 10*3/uL (ref 0.00–0.07)
Basophils Absolute: 0 10*3/uL (ref 0.0–0.1)
Basophils Relative: 1 %
Eosinophils Absolute: 0.5 10*3/uL (ref 0.0–0.5)
Eosinophils Relative: 9 %
HCT: 25.4 % — ABNORMAL LOW (ref 39.0–52.0)
Hemoglobin: 8.4 g/dL — ABNORMAL LOW (ref 13.0–17.0)
Immature Granulocytes: 1 %
Lymphocytes Relative: 27 %
Lymphs Abs: 1.5 10*3/uL (ref 0.7–4.0)
MCH: 28.2 pg (ref 26.0–34.0)
MCHC: 33.1 g/dL (ref 30.0–36.0)
MCV: 85.2 fL (ref 80.0–100.0)
Monocytes Absolute: 0.7 10*3/uL (ref 0.1–1.0)
Monocytes Relative: 12 %
Neutro Abs: 2.8 10*3/uL (ref 1.7–7.7)
Neutrophils Relative %: 50 %
Platelets: 159 10*3/uL (ref 150–400)
RBC: 2.98 MIL/uL — ABNORMAL LOW (ref 4.22–5.81)
RDW: 15.1 % (ref 11.5–15.5)
WBC: 5.4 10*3/uL (ref 4.0–10.5)
nRBC: 0 % (ref 0.0–0.2)

## 2022-02-13 LAB — BASIC METABOLIC PANEL
Anion gap: 10 (ref 5–15)
BUN: 22 mg/dL (ref 8–23)
CO2: 22 mmol/L (ref 22–32)
Calcium: 9.2 mg/dL (ref 8.9–10.3)
Chloride: 104 mmol/L (ref 98–111)
Creatinine, Ser: 1.39 mg/dL — ABNORMAL HIGH (ref 0.61–1.24)
GFR, Estimated: 49 mL/min — ABNORMAL LOW (ref >60)
Glucose, Bld: 122 mg/dL — ABNORMAL HIGH (ref 70–99)
Potassium: 4.6 mmol/L (ref 3.5–5.1)
Sodium: 136 mmol/L (ref 135–145)

## 2022-02-13 LAB — GLUCOSE, CAPILLARY
Glucose-Capillary: 161 mg/dL — ABNORMAL HIGH (ref 70–99)
Glucose-Capillary: 169 mg/dL — ABNORMAL HIGH (ref 70–99)
Glucose-Capillary: 200 mg/dL — ABNORMAL HIGH (ref 70–99)
Glucose-Capillary: 145 mg/dL — ABNORMAL HIGH (ref 70–99)

## 2022-02-13 MED ORDER — POLYETHYLENE GLYCOL 3350 17 G PO PACK
17.0000 g | PACK | Freq: Every day | ORAL | Status: DC
  Administered 2022-02-13 – 2022-02-15 (×3): 17 g via ORAL
  Filled 2022-02-13 (×3): qty 1

## 2022-02-13 MED ORDER — ENZALUTAMIDE 40 MG PO TABS: 120.0000 mg | ORAL_TABLET | Freq: Every day | ORAL | Status: DC

## 2022-02-13 MED ORDER — ACETAMINOPHEN 500 MG PO TABS
1000.0000 mg | ORAL_TABLET | Freq: Four times a day (QID) | ORAL | Status: DC
  Administered 2022-02-13 – 2022-02-15 (×7): 1000 mg via ORAL
  Filled 2022-02-13 (×8): qty 2

## 2022-02-13 MED ORDER — OXYCODONE HCL 5 MG PO TABS
5.0000 mg | ORAL_TABLET | ORAL | Status: DC | PRN
  Administered 2022-02-14: 5 mg via ORAL
  Filled 2022-02-13: qty 1

## 2022-02-13 NOTE — TOC Initial Note (Signed)
Transition of Care [facility]) - Initial/Assessment Note    Patient Details  Name: Marco [name] MRN: [id number] Date of Birth: 1933-09-25  [facility] CM/SW Contact:    Joanne Chars, LCSW Phone Number: 02/13/2022, 4:08 PM  Clinical Narrative:  CSW met with pt and son [name] regarding DC recommendation for SNF. Pt oriented, paying attention to conversation but  information came from son.  They are agreeable to SNF, choice document given, permission given to send out referral in hub.  From [facility], possibly interested in [place], [employer].  Pt lives with son, no current services.  Pt is vaccinated for covid with at least one booster.  Referral sent out in hub for SNF.                 Expected Discharge Plan: Skilled Nursing Facility Barriers to Discharge: Continued Medical Work up, SNF Pending bed offer   Patient Goals and CMS Choice Patient states their goals for this hospitalization and ongoing recovery are:: be able to walk with cane CMS Medicare.gov Compare Post Acute Care list provided to:: Patient Represenative (must comment) (son Marco [name]) Choice offered to / list presented to : Adult Children  Expected Discharge Plan and Services Expected Discharge Plan: [place] In-house Referral: Clinical Social Work   Post Acute Care Choice: [place] Living arrangements for the past 2 months: [place]                                      Prior Living Arrangements/Services Living arrangements for the past 2 months: Single Family Home Lives with:: Adult Children (with son Marco [name]) Patient language and need for interpreter reviewed:: Yes        Need for Family Participation in Patient Care: Yes (Comment) Care giver support system in place?: Yes (comment) Current home services: Other (comment) (none) Criminal Activity/Legal Involvement Pertinent to Current Situation/Hospitalization: No - Comment as  needed  Activities of Daily Living   ADL Screening (condition at time of admission) Patient's cognitive ability adequate to safely complete daily activities?: Yes Is the patient deaf or have difficulty hearing?: No (left ear) Patient able to express need for assistance with ADLs?: Yes Does the patient have difficulty dressing or bathing?: Yes Independently performs ADLs?: Yes (appropriate for developmental age)  Permission Sought/Granted                  Emotional Assessment Appearance:: Appears stated age Attitude/Demeanor/Rapport: Engaged Affect (typically observed): Pleasant Orientation: : Oriented to Self, Oriented to Place, Oriented to  Time, Oriented to Situation Alcohol / Substance Use: Not Applicable Psych Involvement: No (comment)  Admission diagnosis:  Fall [W19.XXXA] Injury of head, initial encounter [Z32.99ME] Fall, initial encounter [W19.XXXA] Closed nondisplaced fracture of pelvis, unspecified part of pelvis, initial encounter ([place]) [S32.9XXA] Patient Active Problem List   Diagnosis Date Noted   Fall 02/11/2022   Prostate cancer ([place]) 05/29/2021   CKD (chronic kidney disease) stage 3, GFR 30-59 ml/min ([place]) 02/11/2021   CAD (coronary artery disease) 03/06/2020   Hypothyroidism 12/02/2017   Chronic pain of right knee 10/13/2017   Chest pain 11/19/2016   Amaurosis fugax 12/12/2015   PCP NOTES >>> 03/08/2015   Osteoporosis 09/15/2014   Primary osteoarthritis of both knees 03/16/2014   Hearing difficulty 10/20/2013   DJD (degenerative joint disease) 08/25/2013   Neck mass 04/25/2013   Hyperlipidemia 04/25/2013  Erectile dysfunction 12/26/2011   Annual physical exam 08/22/2011   Prostate nodule 08/22/2011   Diabetes mellitus ([place]) 05/07/2011   PCP:  Colon Branch, MD Pharmacy:   [facility], [place] [address] [place] [address] Phone: [phone] Fax: [facility] [address] [place] [address] Phone: [phone] Fax: [phone]     Social Determinants of Health (SDOH) Interventions    Readmission Risk Interventions     No data to display

## 2022-02-13 NOTE — Telephone Encounter (Signed)
I'm sad she is feeling that way. Hopefully she will call patient experience and get some answers.

## 2022-02-13 NOTE — Care Management Important Message (Signed)
Important Message  Patient Details  Name: Marco [name] MRN: [id number] Date of Birth: [birth year]   Medicare Important Message Given:  Yes     Hannah Beat 02/13/2022, 2:05 PM

## 2022-02-13 NOTE — Evaluation (Signed)
Occupational Therapy Evaluation Patient Details Name: Marco [name] MRN: [id number] DOB: 02-07-1934 Today's Date: 02/13/2022   History of Present Illness 88 y.o. male  who presented to [facility] ED 02/11/22 after a fall at home.  The patient fell backwards on the stairs.  Hit the back of his head.  Unclear if he lost consciousness prior or after his fall. CT head and negative CT cervical spine; +right rami fracture  PMH significant for prostate cancer on Xtandi, chronic R knee pain with joint effusion, R hip surgery in 2022, hyperlipidemia, hypothyroidism, vitamin B12 deficiency, recently treated for Herpes zoster   Clinical Impression   PTA, pt lives with son in two-level home, typically Modified Independent with ADLs and mobility using RW. Pt able to assist with basic IADLs though family typically manage these tasks. Pt presents now with deficits in pain, endurance, standing balance and overall strength. After locating RW for pt room, able to engage pt in [place] activities. Overall,pt requires Mod A for bed mobility, Min A for short distance mobility using RW. Pt requires Setup for UB ADL and up to Mod A for LB ADLs d/t deficits. Pt requires increased time to complete all tasks. Based on current presentation, inability to safely manage stairs in the home, and available support at DC, recommend SNF rehab at this time. Will continue to follow acutely and update recs as appropriate.   BP supine: 107/61, 83 HR BP sitting: 110/55, 94 HR BP while standing: 135/110, 95 HR (likely inaccurate d/t pt holding RW) BP after activity: 109/64, 83 HR      Recommendations for follow up therapy are one component of a multi-disciplinary discharge planning process, led by the attending physician.  Recommendations may be updated based on patient status, additional functional criteria and insurance authorization.   Follow Up Recommendations  Skilled nursing-short term rehab (<3 hours/day) (unless pt able to stay on one  level of home with consistent family support during the day)    Assistance Recommended at Discharge Frequent or constant Supervision/Assistance  Patient can return home with the following A lot of help with walking and/or transfers;A lot of help with bathing/dressing/bathroom;Assistance with cooking/housework;Help with stairs or ramp for entrance;Assist for transportation    Functional Status Assessment  Patient has had a recent decline in their functional status and demonstrates the ability to make significant improvements in function in a reasonable and predictable amount of time.  Equipment Recommendations  BSC/3in1    Recommendations for Other Services       Precautions / Restrictions Precautions Precautions: Fall Restrictions Weight Bearing Restrictions: Yes RLE Weight Bearing: Weight bearing as tolerated      Mobility Bed Mobility Overal bed mobility: Needs Assistance Bed Mobility: Supine to Sit     Supine to sit: Mod assist, HOB elevated     General bed mobility comments: assist for RLE OOB and lifting trunk, cued to use bedrail with fair success    Transfers Overall transfer level: Needs assistance Equipment used: Rolling walker (2 wheels) Transfers: Sit to/from Stand, Bed to chair/wheelchair/BSC Sit to Stand: Mod assist, From elevated surface     Step pivot transfers: Min assist     General transfer comment: Mod A to stand at bedside, cued to push from bed though pt pulled on RW. Pt able to navigate steps approx 21f with increased time and cues for RW mgmt to recliner chair      Balance Overall balance assessment: Needs assistance, History of Falls Sitting-balance support: No upper extremity supported,  Feet supported Sitting balance-Leahy Scale: Fair     Standing balance support: Bilateral upper extremity supported, During functional activity Standing balance-Leahy Scale: Poor                             ADL either performed or assessed  with clinical judgement   ADL Overall ADL's : Needs assistance/impaired Eating/Feeding: Independent   Grooming: Set up;Sitting   Upper Body Bathing: Set up;Sitting   Lower Body Bathing: Moderate assistance;Sit to/from stand   Upper Body Dressing : Set up   Lower Body Dressing: Moderate assistance;Sit to/from stand   Toilet Transfer: Minimal assistance;Ambulation;Rolling walker (2 wheels)   Toileting- Clothing Manipulation and Hygiene: Minimal assistance;Sit to/from stand       Functional mobility during ADLs: Minimal assistance;Rolling walker (2 wheels) General ADL Comments: Pt with deficits in pain, fear of falling and generalized weakness     Vision Baseline Vision/History: 1 Wears glasses Ability to See in Adequate Light: 0 Adequate Patient Visual Report: No change from baseline Vision Assessment?: No apparent visual deficits     Perception     Praxis      Pertinent Vitals/Pain Pain Assessment Pain Assessment: Faces Faces Pain Scale: Hurts little more Pain Location: R thigh Pain Descriptors / Indicators: Grimacing, Guarding Pain Intervention(s): Monitored during session, RN gave pain meds during session     Hand Dominance Right   Extremity/Trunk Assessment Upper Extremity Assessment Upper Extremity Assessment: Generalized weakness   Lower Extremity Assessment Lower Extremity Assessment: Defer to PT evaluation   Cervical / Trunk Assessment Cervical / Trunk Assessment: Normal   Communication Communication Communication: Other (comment) (preferred language English in chart, can converse with basic [place])   Cognition Arousal/Alertness: Awake/alert Behavior During Therapy: Flat affect Overall Cognitive Status: No family/caregiver present to determine baseline cognitive functioning                                 General Comments: follows directions consistently, slow processing and slower pace     General Comments  Communicated with MD  prior to session to establish WB precautions as none documented with WBAT recommended. Noted airborne precaution signs on door  though none in chart and pt out of window per MD - clarified with MD. no RW in pt room so OT had to locate prior to mobilizing. No chair alarm box in room - nursing notified    Exercises     Shoulder Instructions      Home Living Family/patient expects to be discharged to:: Private residence Living Arrangements: Children Available Help at Discharge: Family;Available PRN/intermittently Type of Home: House       Home Layout: Two level Alternate Level Stairs-Number of Steps: flight             Home Equipment: Conservation officer, nature (2 wheels)   Additional Comments: reports bedroom is upstairs and has to go downstairs to kitchen at home. some home setup difficult to obtain w/ language barrier. Son works and grandchildren out of town, in college, Social research officer, government      Prior Functioning/Environment Prior Level of Function : Independent/Modified Independent             Mobility Comments: uses RW for mobility ADLs Comments: Modified Independent with ADLs, can walk downstairs to kitchen to make a simple meal but has assist for heavier IADLs. does not drive  OT Problem List: Decreased strength;Decreased activity tolerance;Impaired balance (sitting and/or standing);Decreased knowledge of use of DME or AE;Pain      OT Treatment/Interventions: Self-care/ADL training;Therapeutic exercise;Energy conservation;DME and/or AE instruction;Therapeutic activities    OT Goals(Current goals can be found in the care plan section) Acute Rehab OT Goals Patient Stated Goal: pain control, go to wedding in [place] in November OT Goal Formulation: With patient Time For Goal Achievement: 02/27/22 Potential to Achieve Goals: Good  OT Frequency: Min 2X/week    Co-evaluation              AM-PAC OT "6 Clicks" Daily Activity     Outcome Measure Help from another person eating  meals?: None Help from another person taking care of personal grooming?: A Little Help from another person toileting, which includes using toliet, bedpan, or urinal?: A Little Help from another person bathing (including washing, rinsing, drying)?: A Lot Help from another person to put on and taking off regular upper body clothing?: A Little Help from another person to put on and taking off regular lower body clothing?: A Lot 6 Click Score: 17   End of Session Equipment Utilized During Treatment: Rolling walker (2 wheels) Nurse Communication: Mobility status  Activity Tolerance: Patient tolerated treatment well;Patient limited by pain Patient left: in chair;with call bell/phone within reach;Other (comment) (chair pad in recliner chair, no alarm box noted in room - nursing notified)  OT Visit Diagnosis: Other abnormalities of gait and mobility (R26.89);Unsteadiness on feet (R26.81);Pain Pain - Right/Left: Right Pain - part of body: Leg                Time: 0728-0807 OT Time Calculation (min): 39 min Charges:  OT General Charges $OT Visit: 1 Visit OT Evaluation $OT Eval Moderate Complexity: 1 Mod OT Treatments $Self Care/Home Management : 8-22 mins $Therapeutic Activity: 8-22 mins  Malachy Chamber, OTR/L Acute Rehab Services Office: [phone]   Layla Maw 02/13/2022, 8:33 AM

## 2022-02-13 NOTE — Progress Notes (Signed)
PROGRESS NOTE   Marco [name]  IOX:[id number] DOB: Mar 06, 1934 DOA: 02/11/2022 PCP: Colon Branch, MD  Brief Narrative:  88 year old community dwelling.male CAD/05/2016 stress test indeterminant Follows for amaurosis fugax Dr Bing Plume 10/2015 CKD stage II GFR baseline 1.5  Elevated PSA prostate nodule on Xtandi Postherpetic neuralgia on gabapentin until recently  Recent diagnosis of COVID 9/19 went to [facility] [place] [facility]-recovered completely. Subsequently seen again [facility] ED 10/1 syncope fell onto his back was initially bradycardic orthostatic position change--resuscitated with saline-potassium is slightly high, troponin negative, BNP negative  Visited with PCP 10/3 and was taken off gabapentin, recommended Lokelma and recommended fluid hydration given orthostasis when in the ED  Re-presented again 02/11/2022 falling backwards down 6 stairs after getting dizzy + LOC,?  Unconscious, he was oriented on arrival-was found down for 2 to 3 hours after fall  Work-up in ED negative CT head negative CT cervical spine x-ray right hip = right ramus fracture Sodium 130 BUN/creatinine 28/1.7, troponin 10 CRP 0.9, hemoglobin baseline 10 on arrival 8.8 D-dimer 9.3  Hospital-Problem based course  Syncope 2/2 orthostatic BP Dimer elevated 9.3-underlying history of prostate cancer--CT chest no PE continue fluids 75 cc/H-meds unlikely to be casue Orthostatics +, will rpt am to confirm Given recurrent episodes, Therapy rec's SNF  Right ramus pubis fracture Rib #'s Previously using only a cane but has been using a walker at least for a year according to son-he is unable to straight leg raise Also does have contusion above R knee laterally--will r/o 3 with Knee Xr Pain control Tylenol f'1000mg'$  first choice, oxycodone 5 every 4 as needed for moderate/breakthrough, Dilaudid 0.5 IV every 4 as needed for severe pain  Recent COVID Fully recovered this was in 9/19-outside of 10-day isolation so does not need  precautions  Prostate cancer on Levada Dy per UPTOdate has9 to 12% risk of dizziness--we will hold that-D/W Dr. Vikki Ports of urology and this is not a common side effect (usually more hot flashes) I have resumed this today  DM TY 2 Continue Januvia 50/linigliptin daily--if creatinine reliably resolves and stays below 1.4, resume at d/c otherwise hold  CBG all below 180  Prior amaurosis fugax 2017 Imaging of head performed 10/1 showed no specific findings  Prior hyperkalemia CKD 3 AA No acute component at this time creatinine is better than before-would hold Lokelma at this time Saline lock IV  Prior nonrevealing stress test 2018 Consider discussion outpatient discontinuation of aspirin  DVT prophylaxis: Lovenox Code Status: Full Family Communication: Discussed with patient's son, Sam Disposition:  Status is: Inpatient Remains inpatient appropriate because:   Await pain control and possible SNF placement in 24-48 h   Consultants:  None  Procedures:   Antimicrobials: No   Subjective:  Awake coherent Moderate pain above R knee--can bend but seems tender at site of bruise and in hip. He is also constipated  No cp fever  Objective: Vitals:   02/12/22 2130 02/13/22 0042 02/13/22 0457 02/13/22 0848  BP: (!) 100/59 (!) 109/57 (!) 108/54 (!) 101/57  Pulse: 93 77 80 85  Resp:  '16 16 18  '$ Temp: (!) 97.5 F (36.4 C) 97.6 F (36.4 C) 98 F (36.7 C) 98 F (36.7 C)  TempSrc:  Oral Oral   SpO2: 99% 100% 100% 100%    Intake/Output Summary (Last 24 hours) at 02/13/2022 1217 Last data filed at 02/13/2022 0948 Gross per 24 hour  Intake 460 ml  Output 750 ml  Net -290 ml    There  were no vitals filed for this visit.  Examination:  Alert no distress Eomi ncat-arcus + Chest clear no rales, wheeze Abd soft-non distended Rom antalgic to R hip--SLR antalgic Knee does flex from 0-30 degrees- wound upper outer lower thigh noted 4x4 cm--seems tender on movement on  leg     Data Reviewed: personally reviewed   CBC    Component Value Date/Time   WBC 5.4 02/13/2022 0225   RBC 2.98 (L) 02/13/2022 0225   HGB 8.4 (L) 02/13/2022 0225   HCT 25.4 (L) 02/13/2022 0225   PLT 159 02/13/2022 0225   MCV 85.2 02/13/2022 0225   MCH 28.2 02/13/2022 0225   MCHC 33.1 02/13/2022 0225   RDW 15.1 02/13/2022 0225   LYMPHSABS 1.5 02/13/2022 0225   MONOABS 0.7 02/13/2022 0225   EOSABS 0.5 02/13/2022 0225   BASOSABS 0.0 02/13/2022 0225      Latest Ref Rng & Units 02/13/2022    2:25 AM 02/12/2022    5:00 AM 02/11/2022   11:54 PM  CMP  Glucose 70 - 99 mg/dL 122  176    BUN 8 - 23 mg/dL 22  23    Creatinine 0.61 - 1.24 mg/dL 1.39  1.54  1.54   Sodium 135 - 145 mmol/L 136  133    Potassium 3.5 - 5.1 mmol/L 4.6  4.6    Chloride 98 - 111 mmol/L 104  99    CO2 22 - 32 mmol/L 22  24    Calcium 8.9 - 10.3 mg/dL 9.2  9.3       Radiology Studies: ECHOCARDIOGRAM COMPLETE  Result Date: 02/12/2022    ECHOCARDIOGRAM REPORT   Patient Name:   Marco [name] Date of Exam: 02/12/2022 Medical Rec #:  [id number]         Height:       69.0 in Accession #:    [id number]        Weight:       140.0 lb Date of Birth:  February 24, 1934          BSA:          1.775 m Patient Age:    73 years          BP:           110/62 mmHg Patient Gender: M                 HR:           89 bpm. Exam Location:  Outpatient Procedure: 2D Echo, Cardiac Doppler and Color Doppler Indications:    syncope  History:        Patient has prior history of Echocardiogram examinations.  Sonographer:    NA Referring Phys: [id number] [place]  1. Left ventricular ejection fraction, by estimation, is 65 to 70%. The left ventricle has hyperdynamic function. The left ventricle has no regional wall motion abnormalities. There is mild concentric left ventricular hypertrophy. Left ventricular diastolic parameters are consistent with Grade I diastolic dysfunction (impaired relaxation).  2. Right ventricular systolic  function is normal. The right ventricular size is normal. There is normal pulmonary artery systolic pressure. The estimated right ventricular systolic pressure is 93.8 mmHg.  3. The mitral valve is normal in structure. No evidence of mitral valve regurgitation. No evidence of mitral stenosis.  4. The aortic valve is tricuspid. There is mild calcification of the aortic valve. Aortic valve regurgitation is trivial. No aortic stenosis is present.  5. The inferior  vena cava is normal in size with greater than 50% respiratory variability, suggesting right atrial pressure of 3 mmHg. FINDINGS  Left Ventricle: Left ventricular ejection fraction, by estimation, is 65 to 70%. The left ventricle has hyperdynamic function. The left ventricle has no regional wall motion abnormalities. The left ventricular internal cavity size was normal in size. There is mild concentric left ventricular hypertrophy. Left ventricular diastolic parameters are consistent with Grade I diastolic dysfunction (impaired relaxation). Right Ventricle: The right ventricular size is normal. No increase in right ventricular wall thickness. Right ventricular systolic function is normal. There is normal pulmonary artery systolic pressure. The tricuspid regurgitant velocity is 2.27 m/s, and  with an assumed right atrial pressure of 3 mmHg, the estimated right ventricular systolic pressure is 78.2 mmHg. Left Atrium: Left atrial size was normal in size. Right Atrium: Right atrial size was normal in size. Pericardium: There is no evidence of pericardial effusion. Mitral Valve: The mitral valve is normal in structure. No evidence of mitral valve regurgitation. No evidence of mitral valve stenosis. Tricuspid Valve: The tricuspid valve is normal in structure. Tricuspid valve regurgitation is trivial. Aortic Valve: The aortic valve is tricuspid. There is mild calcification of the aortic valve. Aortic valve regurgitation is trivial. No aortic stenosis is present.  Pulmonic Valve: The pulmonic valve was normal in structure. Pulmonic valve regurgitation is not visualized. Aorta: The aortic root is normal in size and structure. Venous: The inferior vena cava is normal in size with greater than 50% respiratory variability, suggesting right atrial pressure of 3 mmHg. IAS/Shunts: No atrial level shunt detected by color flow Doppler.  LEFT VENTRICLE PLAX 2D LVOT diam:     1.80 cm   Diastology LV SV:         45        LV e' medial:    6.84 cm/s LV SV Index:   25        LV E/e' medial:  7.2 LVOT Area:     2.54 cm  LV e' lateral:   10.40 cm/s                          LV E/e' lateral: 4.7  RIGHT VENTRICLE RV S prime:     24.20 cm/s TAPSE (M-mode): 1.7 cm LEFT ATRIUM             Index        RIGHT ATRIUM           Index LA Vol (A2C):   26.1 ml 14.70 ml/m  RA Area:     11.70 cm LA Vol (A4C):   30.3 ml 17.07 ml/m  RA Volume:   23.80 ml  13.41 ml/m LA Biplane Vol: 31.1 ml 17.52 ml/m  AORTIC VALVE LVOT Vmax:   105.00 cm/s LVOT Vmean:  72.900 cm/s LVOT VTI:    0.176 m  AORTA Ao Root diam: 3.20 cm MITRAL VALVE               TRICUSPID VALVE MV Area (PHT): 2.95 cm    TR Peak grad:   20.6 mmHg MV Decel Time: 257 msec    TR Vmax:        227.00 cm/s MV E velocity: 49.10 cm/s MV A velocity: 82.90 cm/s  SHUNTS MV E/A ratio:  0.59        Systemic VTI:  0.18 m  Systemic Diam: 1.80 cm Dalton McleanMD Electronically signed by Franki Monte Signature Date/Time: 02/12/2022/4:12:11 PM    Final    CT Angio Chest Pulmonary Embolism (PE) W or WO Contrast  Addendum Date: 02/12/2022   ADDENDUM REPORT: 02/12/2022 10:58 ADDENDUM: Correction: There is a mildly displaced fracture of the left anterior sixth rib. Findings discussed with Dr. Verlon Au via telephone at 10:50 am. Electronically Signed   By: Margaretha Sheffield M.D.   On: 02/12/2022 10:58   Result Date: 02/12/2022 CLINICAL DATA:  Syncope/presyncope, cerebrovascular cause suspected EXAM: CT ANGIOGRAPHY CHEST WITH  CONTRAST TECHNIQUE: Multidetector CT imaging of the chest was performed using the standard protocol during bolus administration of intravenous contrast. Multiplanar CT image reconstructions and MIPs were obtained to evaluate the vascular anatomy. RADIATION DOSE REDUCTION: This exam was performed according to the departmental dose-optimization program which includes automated exposure control, adjustment of the mA and/or kV according to patient size and/or use of iterative reconstruction technique. CONTRAST:  52m OMNIPAQUE IOHEXOL 350 MG/ML SOLN COMPARISON:  Chest x-ray February 11, 2022. FINDINGS: Cardiovascular: Satisfactory opacification of the pulmonary arteries to the segmental level. No evidence of pulmonary embolism. Normal heart size. No pericardial effusion. Aortic atherosclerosis. Coronary artery atherosclerosis. Mediastinum/Nodes: No enlarged mediastinal, hilar, or axillary lymph nodes. Thyroid gland, trachea, and esophagus demonstrate no significant findings. Lungs/Pleura: Mild peripheral predominant fibrotic change in the lungs. No consolidation. No pleural effusions or pneumothorax. Upper Abdomen: No acute abnormality. Musculoskeletal: No acute fracture. Polyarticular degenerative change. Sclerotic lesion within the T12 vertebral body, compatible with osseous metastatic disease. Review of the MIP images confirms the above findings. IMPRESSION: 1. No evidence of acute pulmonary embolism to the segmental level. 2. Mild fibrotic change in the lungs without consolidation. 3. Sclerotic T12 osseous metastasis. 4. Aortic Atherosclerosis (ICD10-I70.0). Electronically Signed: By: FMargaretha SheffieldM.D. On: 02/12/2022 09:11   CT Head Wo Contrast  Result Date: 02/11/2022 CLINICAL DATA:  Trauma EXAM: CT HEAD WITHOUT CONTRAST CT MAXILLOFACIAL WITHOUT CONTRAST CT CERVICAL SPINE WITHOUT CONTRAST TECHNIQUE: Multidetector CT imaging of the head, cervical spine, and maxillofacial structures were performed using the  standard protocol without intravenous contrast. Multiplanar CT image reconstructions of the cervical spine and maxillofacial structures were also generated. RADIATION DOSE REDUCTION: This exam was performed according to the departmental dose-optimization program which includes automated exposure control, adjustment of the mA and/or kV according to patient size and/or use of iterative reconstruction technique. COMPARISON:  CT head dated 02/02/2022 FINDINGS: CT HEAD FINDINGS Brain: No evidence of acute infarction, hemorrhage, hydrocephalus, extra-axial collection or mass lesion/mass effect. Global cortical atrophy. Mild subcortical white matter and periventricular small vessel ischemic changes. Vascular: No hyperdense vessel or unexpected calcification. Skull: Normal. Negative for fracture or focal lesion. Other: Small extracranial hematoma overlying the left parietal bone (series 3/image 17). CT MAXILLOFACIAL FINDINGS Osseous: No evidence of maxillofacial fracture. Nasal bones are intact. Mandible is intact. Bilateral mandibular condyles are well-seated in the TMJs. Orbits: Bilateral orbits, including the globes and retroconal soft tissues, are within normal limits. Sinuses: The visualized paranasal sinuses are essentially clear. The mastoid air cells are unopacified. Soft tissues: Soft tissue swelling/laceration overlying the nasal bridge, left greater than right (series 3/image 50). CT CERVICAL SPINE FINDINGS Alignment: Normal cervical lordosis. Skull base and vertebrae: No acute fracture. No primary bone lesion or focal pathologic process. Soft tissues and spinal canal: No prevertebral fluid or swelling. No visible canal hematoma. Disc levels: Mild degenerative changes of the mid cervical spine. Spinal canal is patent. Upper chest: Visualized lung  apices are clear. Other: Visualized thyroid is unremarkable. IMPRESSION: Small extracranial hematoma overlying the left parietal bone. No evidence of calvarial fracture.  No evidence of acute intracranial abnormality. Atrophy with mild small vessel ischemic changes. Soft tissue swelling/laceration overlying the nasal bridge, left greater than right. No evidence of maxillofacial fracture. No traumatic injury to the cervical spine. Mild degenerative changes. Electronically Signed   By: Julian Hy M.D.   On: 02/11/2022 23:15   CT Cervical Spine Wo Contrast  Result Date: 02/11/2022 CLINICAL DATA:  Trauma EXAM: CT HEAD WITHOUT CONTRAST CT MAXILLOFACIAL WITHOUT CONTRAST CT CERVICAL SPINE WITHOUT CONTRAST TECHNIQUE: Multidetector CT imaging of the head, cervical spine, and maxillofacial structures were performed using the standard protocol without intravenous contrast. Multiplanar CT image reconstructions of the cervical spine and maxillofacial structures were also generated. RADIATION DOSE REDUCTION: This exam was performed according to the departmental dose-optimization program which includes automated exposure control, adjustment of the mA and/or kV according to patient size and/or use of iterative reconstruction technique. COMPARISON:  CT head dated 02/02/2022 FINDINGS: CT HEAD FINDINGS Brain: No evidence of acute infarction, hemorrhage, hydrocephalus, extra-axial collection or mass lesion/mass effect. Global cortical atrophy. Mild subcortical white matter and periventricular small vessel ischemic changes. Vascular: No hyperdense vessel or unexpected calcification. Skull: Normal. Negative for fracture or focal lesion. Other: Small extracranial hematoma overlying the left parietal bone (series 3/image 17). CT MAXILLOFACIAL FINDINGS Osseous: No evidence of maxillofacial fracture. Nasal bones are intact. Mandible is intact. Bilateral mandibular condyles are well-seated in the TMJs. Orbits: Bilateral orbits, including the globes and retroconal soft tissues, are within normal limits. Sinuses: The visualized paranasal sinuses are essentially clear. The mastoid air cells are  unopacified. Soft tissues: Soft tissue swelling/laceration overlying the nasal bridge, left greater than right (series 3/image 50). CT CERVICAL SPINE FINDINGS Alignment: Normal cervical lordosis. Skull base and vertebrae: No acute fracture. No primary bone lesion or focal pathologic process. Soft tissues and spinal canal: No prevertebral fluid or swelling. No visible canal hematoma. Disc levels: Mild degenerative changes of the mid cervical spine. Spinal canal is patent. Upper chest: Visualized lung apices are clear. Other: Visualized thyroid is unremarkable. IMPRESSION: Small extracranial hematoma overlying the left parietal bone. No evidence of calvarial fracture. No evidence of acute intracranial abnormality. Atrophy with mild small vessel ischemic changes. Soft tissue swelling/laceration overlying the nasal bridge, left greater than right. No evidence of maxillofacial fracture. No traumatic injury to the cervical spine. Mild degenerative changes. Electronically Signed   By: Julian Hy M.D.   On: 02/11/2022 23:15   CT Maxillofacial WO CM  Result Date: 02/11/2022 CLINICAL DATA:  Trauma EXAM: CT HEAD WITHOUT CONTRAST CT MAXILLOFACIAL WITHOUT CONTRAST CT CERVICAL SPINE WITHOUT CONTRAST TECHNIQUE: Multidetector CT imaging of the head, cervical spine, and maxillofacial structures were performed using the standard protocol without intravenous contrast. Multiplanar CT image reconstructions of the cervical spine and maxillofacial structures were also generated. RADIATION DOSE REDUCTION: This exam was performed according to the departmental dose-optimization program which includes automated exposure control, adjustment of the mA and/or kV according to patient size and/or use of iterative reconstruction technique. COMPARISON:  CT head dated 02/02/2022 FINDINGS: CT HEAD FINDINGS Brain: No evidence of acute infarction, hemorrhage, hydrocephalus, extra-axial collection or mass lesion/mass effect. Global cortical  atrophy. Mild subcortical white matter and periventricular small vessel ischemic changes. Vascular: No hyperdense vessel or unexpected calcification. Skull: Normal. Negative for fracture or focal lesion. Other: Small extracranial hematoma overlying the left parietal bone (series 3/image 17). CT  MAXILLOFACIAL FINDINGS Osseous: No evidence of maxillofacial fracture. Nasal bones are intact. Mandible is intact. Bilateral mandibular condyles are well-seated in the TMJs. Orbits: Bilateral orbits, including the globes and retroconal soft tissues, are within normal limits. Sinuses: The visualized paranasal sinuses are essentially clear. The mastoid air cells are unopacified. Soft tissues: Soft tissue swelling/laceration overlying the nasal bridge, left greater than right (series 3/image 50). CT CERVICAL SPINE FINDINGS Alignment: Normal cervical lordosis. Skull base and vertebrae: No acute fracture. No primary bone lesion or focal pathologic process. Soft tissues and spinal canal: No prevertebral fluid or swelling. No visible canal hematoma. Disc levels: Mild degenerative changes of the mid cervical spine. Spinal canal is patent. Upper chest: Visualized lung apices are clear. Other: Visualized thyroid is unremarkable. IMPRESSION: Small extracranial hematoma overlying the left parietal bone. No evidence of calvarial fracture. No evidence of acute intracranial abnormality. Atrophy with mild small vessel ischemic changes. Soft tissue swelling/laceration overlying the nasal bridge, left greater than right. No evidence of maxillofacial fracture. No traumatic injury to the cervical spine. Mild degenerative changes. Electronically Signed   By: Julian Hy M.D.   On: 02/11/2022 23:15   DG Chest 1 View  Result Date: 02/11/2022 CLINICAL DATA:  Fall with pain in right leg. EXAM: CHEST  1 VIEW COMPARISON:  Radiograph 02/02/2022 FINDINGS: Lower lung volumes from prior exam. Stable heart size and mediastinal contours allowing  for differences in technique. No pneumothorax, large pleural effusion or focal airspace disease. No acute osseous findings. IMPRESSION: Low lung volumes without acute abnormality. Electronically Signed   By: Keith Rake M.D.   On: 02/11/2022 20:31   DG Pelvis 1-2 Views  Result Date: 02/11/2022 CLINICAL DATA:  Post fall with right leg pain. EXAM: PELVIS - 1-2 VIEW COMPARISON:  None Available. FINDINGS: There is slight cortical offset of the right superior pubic ramus which may represent a nondisplaced fracture. No convincing inferior pubic ramus fracture. Femoral head is well seated in the acetabulum. Surgical hardware in the right proximal femur is intact. Pubic symphysis and sacroiliac joints are congruent. IMPRESSION: Findings suspicious for right superior pubic ramus fracture. Electronically Signed   By: Keith Rake M.D.   On: 02/11/2022 20:30   DG Femur Min 2 Views Right  Result Date: 02/11/2022 CLINICAL DATA:  Fall, pain in right leg. EXAM: RIGHT FEMUR 2 VIEWS COMPARISON:  Radiograph 10/12/2019 FINDINGS: Intramedullary nail with trans trochanteric and distal locking screw fixation. The previous intertrochanteric femur fracture has healed. The trans trochanteric screws again project into the lateral soft tissues by approximately 15 mm. There is no acute or periprosthetic fracture. The femoral head is seated in the acetabulum. Degenerative change of the hip and knee. IMPRESSION: 1. No acute fracture of the right femur. 2. Intramedullary nail with trans trochanteric and distal locking screw fixation, healed intertrochanteric fracture. Electronically Signed   By: Keith Rake M.D.   On: 02/11/2022 20:28     Scheduled Meds:  atorvastatin  40 mg Oral QHS   vitamin B-12  2,500 mcg Oral Daily   enoxaparin (LOVENOX) injection  30 mg Subcutaneous Daily   ezetimibe  10 mg Oral Daily   gabapentin  100 mg Oral BID   insulin aspart  0-5 Units Subcutaneous QHS   insulin aspart  0-9 Units  Subcutaneous TID WC   levothyroxine  100 mcg Oral QAC breakfast   linagliptin  5 mg Oral Daily   multivitamin with minerals  1 tablet Oral Daily   senna-docusate  1 tablet Oral  QHS   Continuous Infusions:     LOS: 2 days   Time spent: [place], MD [facility] To contact the attending provider between 7A-7P or the covering provider during after hours 7P-7A, please log into the web site [email or web address] and access using universal [place] password for that web site. If you do not have the password, please call the hospital operator.  02/13/2022, 12:17 PM

## 2022-02-13 NOTE — [place] FL2 (Signed)
[place] MEDICAID FL2 LEVEL OF CARE SCREENING TOOL     IDENTIFICATION  Patient Name: Marco [name] Birthdate: 1934/01/04 Sex: male Admission Date (Current Location): 02/11/2022  [place] and [place] Number:  Kathleen Argue [id number] [place] and Address:  [facility] [place] [facility], [place] [address] [place], [place] [address]      Provider Number: [id number]  Attending Physician Name and Address:  Nita Sells, MD  Relative Name and Phone Number:  Connery, Shiffler [phone]    Current Level of Care: Hospital Recommended Level of Care: [place] Prior Approval Number:    Date Approved/Denied:   PASRR Number: [id number]  Discharge Plan: SNF    Current Diagnoses: Patient Active Problem List   Diagnosis Date Noted   Fall 02/11/2022   Prostate cancer ([place]) 05/29/2021   CKD (chronic kidney disease) stage 3, GFR 30-59 ml/min ([place]) 02/11/2021   CAD (coronary artery disease) 03/06/2020   Hypothyroidism 12/02/2017   Chronic pain of right knee 10/13/2017   Chest pain 11/19/2016   Amaurosis fugax 12/12/2015   PCP NOTES >>> 03/08/2015   Osteoporosis 09/15/2014   Primary osteoarthritis of both knees 03/16/2014   Hearing difficulty 10/20/2013   DJD (degenerative joint disease) 08/25/2013   Neck mass 04/25/2013   Hyperlipidemia 04/25/2013   Erectile dysfunction 12/26/2011   Annual physical exam 08/22/2011   Prostate nodule 08/22/2011   Diabetes mellitus ([place]) 05/07/2011    Orientation RESPIRATION BLADDER Height & Weight     Self, Time, Situation, Place  Normal Continent Weight:   Height:     BEHAVIORAL SYMPTOMS/MOOD NEUROLOGICAL BOWEL NUTRITION STATUS      Continent Diet (see discharge summary)  AMBULATORY STATUS COMMUNICATION OF NEEDS Skin   Limited Assist Verbally Skin abrasions, Other (Comment) (redness)                       Personal Care Assistance Level of Assistance  Bathing, Dressing, Feeding Bathing Assistance: Limited  assistance Feeding assistance: Independent Dressing Assistance: Limited assistance     Functional Limitations Info  Sight, Hearing, Speech Sight Info: Adequate Hearing Info: Adequate Speech Info: Adequate    SPECIAL CARE FACTORS FREQUENCY  PT (By licensed PT), OT (By licensed OT)     PT Frequency: 5x week OT Frequency: 5x wee            Contractures Contractures Info: Not present    Additional Factors Info  Insulin Sliding Scale Code Status Info: full Allergies Info: Beef-derived Products, Chicken Allergy, Fish Allergy, Pork-derived Products   Insulin Sliding Scale Info: Novolog: see discharge summary       Current Medications (02/13/2022):  This is the current hospital active medication list Current Facility-Administered Medications  Medication Dose Route Frequency Provider Last Rate Last Admin   acetaminophen (TYLENOL) tablet 1,000 mg  1,000 mg Oral Q6H Samtani, Jai-Gurmukh, MD   1,000 mg at 02/13/22 1413   atorvastatin (LIPITOR) tablet 40 mg  40 mg Oral QHS Hall, Carole N, DO   40 mg at 02/12/22 2210   cyanocobalamin (VITAMIN B12) tablet 2,500 mcg  2,500 mcg Oral Daily Irene Pap N, DO   2,500 mcg at 02/13/22 1043   enoxaparin (LOVENOX) injection 30 mg  30 mg Subcutaneous Daily Hall, Carole N, DO   30 mg at 02/13/22 1043   enzalutamide (XTANDI) tablet 120 mg  120 mg Oral Daily Nita Sells, MD       ezetimibe (ZETIA) tablet 10 mg  10 mg Oral Daily Irene Pap  N, DO   10 mg at 02/13/22 1044   gabapentin (NEURONTIN) capsule 100 mg  100 mg Oral BID Irene Pap N, DO   100 mg at 02/13/22 1044   HYDROmorphone (DILAUDID) injection 0.5 mg  0.5 mg Intravenous Q4H PRN Irene Pap N, DO   0.5 mg at 02/13/22 1050   insulin aspart (novoLOG) injection 0-5 Units  0-5 Units Subcutaneous QHS Irene Pap N, DO   2 Units at 02/12/22 2347   insulin aspart (novoLOG) injection 0-9 Units  0-9 Units Subcutaneous TID WC Irene Pap N, DO   2 Units at 02/13/22 1222    levothyroxine (SYNTHROID) tablet 100 mcg  100 mcg Oral QAC breakfast Irene Pap N, DO   100 mcg at 02/13/22 4709   linagliptin (TRADJENTA) tablet 5 mg  5 mg Oral Daily Nita Sells, MD   5 mg at 02/13/22 1044   multivitamin with minerals tablet 1 tablet  1 tablet Oral Daily Irene Pap N, DO   1 tablet at 02/13/22 1044   oxyCODONE (Oxy IR/ROXICODONE) immediate release tablet 5-10 mg  5-10 mg Oral Q4H PRN Nita Sells, MD       polyethylene glycol (MIRALAX / GLYCOLAX) packet 17 g  17 g Oral Daily Nita Sells, MD   17 g at 02/13/22 1413   senna-docusate (Senokot-S) tablet 1 tablet  1 tablet Oral QHS Irene Pap N, DO   1 tablet at 02/12/22 2210     Discharge Medications: Please see discharge summary for a list of discharge medications.  Relevant Imaging Results:  Relevant Lab Results:   Additional Information SSN: [id number].  Pt reports he is vaccinated for covid with at least one booster.  Joanne Chars, LCSW

## 2022-02-13 NOTE — Evaluation (Signed)
Physical Therapy Evaluation Patient Details Name: Marco [name] MRN: [id number] DOB: 1934-03-08 Today's Date: 02/13/2022  History of Present Illness  88 y.o. male  who presented to [facility] ED 02/11/22 after a fall at home.  The patient fell backwards on the stairs.  Hit the back of his head.  Unclear if he lost consciousness prior or after his fall. CT head and negative CT cervical spine; +right rami fracture  PMH significant for prostate cancer on Xtandi, chronic R knee pain with joint effusion, R hip surgery in 2022, hyperlipidemia, hypothyroidism, vitamin B12 deficiency, recently treated for Herpes zoster  Clinical Impression   Pt admitted secondary to problem above with deficits below. PTA patient was living with son (who works), walking with RW vs 1 or 2 canes, climbing stairs with rail to his upstairs bedroom and shower.  Pt currently requires mod assist for bed mobility and min assist and significantly increased time to ambulate only 9 feet with RW. Patient reports he cannot stay downstairs and does not have family support when son at work. Patient agrees he will need SNF for continued therapies prior to return home. See below for vestibular assessment--pt denies any dizziness prior to fall or since admitted to hospital. No vertigo elicited during session.  Anticipate patient will benefit from PT to address problems listed below.Will continue to follow acutely to maximize functional mobility independence and safety.          Recommendations for follow up therapy are one component of a multi-disciplinary discharge planning process, led by the attending physician.  Recommendations may be updated based on patient status, additional functional criteria and insurance authorization.  Follow Up Recommendations Skilled nursing-short term rehab (<3 hours/day) Can patient physically be transported by private vehicle: No    Assistance Recommended at Discharge Frequent or constant Supervision/Assistance   Patient can return home with the following  A little help with walking and/or transfers;Assistance with cooking/housework;Assist for transportation;Help with stairs or ramp for entrance    Equipment Recommendations None recommended by PT  Recommendations for Other Services       Functional Status Assessment Patient has had a recent decline in their functional status and demonstrates the ability to make significant improvements in function in a reasonable and predictable amount of time.     Precautions / Restrictions Precautions Precautions: Fall Restrictions Weight Bearing Restrictions: Yes RLE Weight Bearing: Weight bearing as tolerated      Mobility  Bed Mobility Overal bed mobility: Needs Assistance Bed Mobility: Supine to Sit, Sit to Supine     Supine to sit: Mod assist, HOB elevated Sit to supine: Mod assist   General bed mobility comments: assist for RLE OOB and lifting trunk, cued to use bedrail with fair success; return to bed with assist to RLE and trunk    Transfers Overall transfer level: Needs assistance Equipment used: Rolling walker (2 wheels) Transfers: Sit to/from Stand, Bed to chair/wheelchair/BSC Sit to Stand: Min assist           General transfer comment: vc repeated for hand placment with RW (instinct to pull up on RW handles, but ultimately understood to push off bed)    Ambulation/Gait Ambulation/Gait assistance: Min assist Gait Distance (Feet): 9 Feet Assistive device: Rolling walker (2 wheels) Gait Pattern/deviations: Step-to pattern, Decreased stride length, Antalgic   Gait velocity interpretation: <1.31 ft/sec, indicative of household ambulator   General Gait Details: very slow, shuffling steps; albe to maneuver RW without assist when moving forwards or during turn; side-stepping  required assist with moving RW  Stairs            Wheelchair Mobility    Modified Rankin (Stroke Patients Only)       Balance Overall balance  assessment: Needs assistance, History of Falls Sitting-balance support: No upper extremity supported, Feet supported Sitting balance-Leahy Scale: Fair     Standing balance support: Bilateral upper extremity supported, During functional activity Standing balance-Leahy Scale: Poor Standing balance comment: able to stand hesitantly without UE support minguard; required UE support of single UE when reaching with opposite UE; minguard for standing feet together eyes open with pt seeking UE support                            Vestibular assessment 02/13/22 0001  Symptom Behavior  Subjective history of current problem pt denies dizzines prior to recent fall or since admitted to hospital  Oculomotor Exam  Oculomotor Alignment Normal  Ocular ROM normal  Spontaneous Absent  Gaze-induced  Absent  Smooth Pursuits Intact  Vestibulo-Ocular Reflex  VOR 1 Head Only (x 1 viewing) intact    Pertinent Vitals/Pain Pain Assessment Pain Assessment: Faces Faces Pain Scale: Hurts whole lot Pain Location: R thigh Pain Descriptors / Indicators: Grimacing, Guarding Pain Intervention(s): Limited activity within patient's tolerance, Monitored during session, Premedicated before session, Repositioned    Home Living Family/patient expects to be discharged to:: Private residence Living Arrangements: Children Available Help at Discharge: Family;Available PRN/intermittently Type of Home: House       Alternate Level Stairs-Number of Steps: flight Home Layout: Two level Home Equipment: Rolling Walker (2 wheels) Additional Comments: reports bedroom is upstairs and has to go downstairs to kitchen at home. some home setup difficult to obtain w/ language barrier. Son works and grandchildren out of town, in college, Social research officer, government    Prior Function Prior Level of Function : Independent/Modified Independent             Mobility Comments: uses RW for mobility when downstairs, uses 1 or 2 canes when mobilizing  upstairs ADLs Comments: Modified Independent with ADLs, can walk downstairs to kitchen to make a simple meal but has assist for heavier IADLs. does not drive     Hand Dominance   Dominant Hand: Right    Extremity/Trunk Assessment   Upper Extremity Assessment Upper Extremity Assessment: Generalized weakness    Lower Extremity Assessment Lower Extremity Assessment: Generalized weakness (rt distal thigh and knee swollen; reports primary pain in distal thigh (abrasion noted) and denies pelvic pain)    Cervical / Trunk Assessment Cervical / Trunk Assessment: Normal  Communication   Communication: Other (comment) (preferred language English in chart, can converse with basic English but heavy accent)  Cognition Arousal/Alertness: Awake/alert Behavior During Therapy: Flat affect                                   General Comments: follows directions consistently, slow processing and slower pace        General Comments      Exercises     Assessment/Plan    PT Assessment Patient needs continued PT services  PT Problem List Decreased strength;Decreased range of motion;Decreased activity tolerance;Decreased balance;Decreased mobility;Decreased knowledge of use of DME;Pain       PT Treatment Interventions DME instruction;Gait training;Stair training;Functional mobility training;Therapeutic activities;Therapeutic exercise;Balance training;Patient/family education    PT Goals (Current goals can be found in  the Care Plan section)  Acute Rehab PT Goals Patient Stated Goal: to regain abiltiy to walk PT Goal Formulation: With patient Time For Goal Achievement: 02/27/22 Potential to Achieve Goals: Good    Frequency Min 3X/week     Co-evaluation               AM-PAC PT "6 Clicks" Mobility  Outcome Measure Help needed turning from your back to your side while in a flat bed without using bedrails?: A Lot Help needed moving from lying on your back to sitting  on the side of a flat bed without using bedrails?: A Lot Help needed moving to and from a bed to a chair (including a wheelchair)?: A Little Help needed standing up from a chair using your arms (e.g., wheelchair or bedside chair)?: A Little Help needed to walk in hospital room?: A Lot Help needed climbing 3-5 steps with a railing? : Total 6 Click Score: 13    End of Session Equipment Utilized During Treatment: Gait belt Activity Tolerance: Patient limited by pain Patient left: in bed;with call bell/phone within reach (RN notified bed alarm not functioning) Nurse Communication: Mobility status;Other (comment) (bed alarm not working) PT Visit Diagnosis: Other abnormalities of gait and mobility (R26.89);Repeated falls (R29.6);Difficulty in walking, not elsewhere classified (R26.2)    Time: 3094-0768 PT Time Calculation (min) (ACUTE ONLY): 27 min   Charges:   PT Evaluation $PT Eval Low Complexity: 1 Low PT Treatments $Gait Training: 8-22 mins         Arby Barrette, PT Acute Rehabilitation Services  Office [phone]   Rexanne Mano 02/13/2022, 11:59 AM

## 2022-02-13 NOTE — Progress Notes (Signed)
PT Cancellation Note  Patient Details Name: Marco [name] MRN: [id number] DOB: Oct 22, 1933   Cancelled Treatment:    Reason Eval/Treat Not Completed: Patient at procedure or test/unavailable   Patient just starting to eat his breakfast. Noted OT recommending SNF on discharge. Will follow-up this morning.    [place]  Office [phone]   Rexanne Mano 02/13/2022, 9:27 AM

## 2022-02-13 NOTE — TOC CAGE-AID Note (Signed)
Transition of Care [facility]) - CAGE-AID Screening   Patient Details  Name: Marco [name] MRN: [id number] Date of Birth: 01/11/1934  Transition of Care [facility]) CM/SW Contact:    Army Melia, RN Phone Number:[phone] 02/13/2022, 6:26 AM   Clinical Narrative:  No hx of drug/alcohol use, no resources indicated.  CAGE-AID Screening:    Have You Ever Felt You Ought to Cut Down on Your Drinking or Drug Use?: No Have People Annoyed You By Critizing Your Drinking Or Drug Use?: No Have You Felt Bad Or Guilty About Your Drinking Or Drug Use?: No Have You Ever Had a Drink or Used Drugs First Thing In The Morning to Steady Your Nerves or to Get Rid of a Hangover?: No CAGE-AID Score: 0  Substance Abuse Education Offered: No

## 2022-02-14 ENCOUNTER — Encounter: Payer: Self-pay | Admitting: Family Medicine

## 2022-02-14 LAB — GLUCOSE, CAPILLARY
Glucose-Capillary: 143 mg/dL — ABNORMAL HIGH (ref 70–99)
Glucose-Capillary: 158 mg/dL — ABNORMAL HIGH (ref 70–99)
Glucose-Capillary: 185 mg/dL — ABNORMAL HIGH (ref 70–99)
Glucose-Capillary: 141 mg/dL — ABNORMAL HIGH (ref 70–99)

## 2022-02-14 MED ORDER — ACETAMINOPHEN 500 MG PO TABS: 1000.0000 mg | ORAL_TABLET | Freq: Four times a day (QID) | ORAL | 2 refills | Status: AC | PRN

## 2022-02-14 MED ORDER — OXYCODONE HCL 5 MG PO TABS: 5.0000 mg | ORAL_TABLET | ORAL | 0 refills | Status: DC | PRN

## 2022-02-14 MED ORDER — SENNOSIDES-DOCUSATE SODIUM 8.6-50 MG PO TABS: 1.0000 | ORAL_TABLET | Freq: Every day | ORAL | Status: DC

## 2022-02-14 MED ORDER — GABAPENTIN 100 MG PO CAPS: 100.0000 mg | ORAL_CAPSULE | Freq: Two times a day (BID) | ORAL | 0 refills | Status: DC

## 2022-02-14 MED ORDER — POLYETHYLENE GLYCOL 3350 17 G PO PACK: 17.0000 g | PACK | Freq: Every day | ORAL | 0 refills | Status: DC

## 2022-02-14 NOTE — Discharge Summary (Signed)
Physician Discharge Summary  Marco [name] AJG:[id number] DOB: 01-16-1934 DOA: 02/11/2022  PCP: Colon Branch, MD  Admit date: 02/11/2022 Discharge date: 02/14/2022  Time spent: 27 minutes  Recommendations for Outpatient Follow-up:  Patient will discharge to skilled facility for progressive ambulation and can be weightbearing as tolerated without restriction Pain control first choice Tylenol 1000 mg, can use Percocet 1 to 2 tablets for breakthrough/severe pain Needs Chem-12 CBC in about 1 week Recommend outpatient follow-up with specialists urology, Dr. Abner Greenspan, ophthalmology Dr. Bing Plume  Discharge Diagnoses:  MAIN problem for hospitalization   Sacral insufficiency fracture with orthostatic syncope causing this Prostate cancer with no evidence of any metastases Recently recovered from [place] does not need isolation CKD 3 AA with no hyperkalemia Prior normal stress test and left distant past DM TY 2  Please see below for itemized issues addressed in HOpsital- refer to other progress notes for clarity if needed  Discharge Condition: Improved  Diet recommendation: Diabetic vegetarian  There were no vitals filed for this visit.  History of present illness:  88 year old community dwelling.male CAD/05/2016 stress test indeterminant Follows for amaurosis fugax Dr Bing Plume 10/2015 CKD stage II GFR baseline 1.5  Elevated PSA prostate nodule on Xtandi Postherpetic neuralgia on gabapentin until recently   Recent diagnosis of COVID 9/19 went to [facility] [place]-recovered completely. Subsequently seen again [facility] ED 10/1 syncope fell onto his back was initially bradycardic orthostatic position change--resuscitated with saline-potassium is slightly high, troponin negative, BNP negative   Visited with PCP 10/3 and was taken off gabapentin, recommended Lokelma and recommended fluid hydration given orthostasis when in the ED   Re-presented again 02/11/2022 falling backwards down 6 stairs after  getting dizzy + LOC,?  Unconscious, he was oriented on arrival-was found down for 2 to 3 hours after fall   Work-up in ED negative CT head negative CT cervical spine x-ray right hip = right ramus fracture Sodium 130 BUN/creatinine 28/1.7, troponin 10 CRP 0.9, hemoglobin baseline 10 on arrival 8.8 D-dimer 9.3  Hospital Course:  Syncope 2/2 orthostatic BP Dimer elevated 9.3-underlying history of prostate cancer--CT chest no PE Orthostatics + on evaluation and will require slow steady ambulation and education about this on discharge from facility by skilled MD, skilled therapy services   Right ramus pubis fracture Rib #'s Previously using only a cane but has been using a walker at least for a year according to son-he is unable to straight leg raise Also does have contusion above R knee laterally--he does not have any fracture of the right knee-the wound on the lateral side of the right knee can be dressed with a simple dressing he has some swelling Pain control Tylenol 1076m first choice, oxycodone 5 every 4 as needed for moderate/breakthrough Pain is controlled on oral meds-transiently on Dilaudid 0.5 IV every 4 as needed for severe pain but not requiring now   Recent COVID Fully recovered this was in 9/19-outside of 10-day isolation so does not need precautions   Prostate cancer on XLevada Dyper UPTOdate has9 to 12% risk of dizziness--we will hold that-D/W Dr. MVikki Portsof urology and this is not a common side effect (usually more hot flashes) I have resumed this today   DM TY 2 Continue Januvia 50/linigliptin daily--if creatinine reliably resolves and stays below 1.4, resume at d/c otherwise hold  CBG all below 180   Prior amaurosis fugax 2017 Imaging of head performed 10/1 showed no specific findings   Prior hyperkalemia no longer on treatment CKD 3  AA No acute component at this time  Saline lock IV   Prior nonrevealing stress test 2018 Consider discussion outpatient  discontinuation of aspirin   Discharge Exam: Vitals:   02/14/22 0420 02/14/22 0810  BP: 107/73 122/63  Pulse: 77 85  Resp: 17 17  Temp: 98.3 F (36.8 C) (!) 97.5 F (36.4 C)  SpO2: 98% 99%    Subj on day of d/c   Awake coherent seems to be comfortable tells me pain is 6/10 appears to have received 1 singular dose of Dilaudid overnight but is taking mainly Tylenol for pain States that the majority of the pain is in his knee No chest pain no fever He understands that he probably needs to go to skilled  His son was not available today but I had a detailed discussion with him on 10/12  Discharge Instructions   Discharge Instructions     Diet - low sodium heart healthy   Complete by: As directed    Increase activity slowly   Complete by: As directed       Allergies as of 02/14/2022       Reactions   Beef-derived Products    vegetarian   Chicken Allergy    Fish Allergy    Pork-derived Products         Medication List     STOP taking these medications    Lokelma 10 g Pack packet Generic drug: sodium zirconium cyclosilicate       TAKE these medications    Accu-Chek Guide Control Liqd Use as directed   Accu-Chek Guide test strip Generic drug: glucose blood USE TO CHECK BLOOD SUGAR  ONCE DAILY   Accu-Chek Guide w/Device Kit Check blood sugars once daily   Accu-Chek Softclix Lancets lancets CHECK BLOOD SUGAR ONCE  DAILY AS DIRECTED   acetaminophen 500 MG tablet Commonly known as: TYLENOL Take 2 tablets (1,000 mg total) by mouth every 6 (six) hours as needed.   Alcohol Swabs Pads Use to clean finger prior to testing blood sugars   alendronate 70 MG tablet Commonly known as: FOSAMAX TAKE 1 TABLET(70 MG) BY MOUTH 1 TIME A WEEK WITH A FULL GLASS OF WATER AND ON AN EMPTY STOMACH   atorvastatin 40 MG tablet Commonly known as: LIPITOR Take 1 tablet (40 mg total) by mouth at bedtime.   Calcium 500 + D 500-3.125 MG-MCG Tabs Generic drug: Calcium  Carb-Cholecalciferol Take 1 tablet by mouth daily.   ezetimibe 10 MG tablet Commonly known as: ZETIA Take 1 tablet (10 mg total) by mouth daily.   gabapentin 100 MG capsule Commonly known as: NEURONTIN Take 1 capsule (100 mg total) by mouth 2 (two) times daily.   levothyroxine 100 MCG tablet Commonly known as: SYNTHROID Take 1 tablet (100 mcg total) by mouth daily before breakfast.   metFORMIN 500 MG tablet Commonly known as: GLUCOPHAGE Take 1 tablet (500 mg total) by mouth daily. *take with a meal*   MULTI-VITAMIN DAILY PO Take 1 tablet by mouth daily.   nitroGLYCERIN 0.4 MG SL tablet Commonly known as: NITROSTAT Place 1 tablet (0.4 mg total) under the tongue every 5 (five) minutes x 3 doses as needed for chest pain.   oxyCODONE 5 MG immediate release tablet Commonly known as: Oxy IR/ROXICODONE Take 1-2 tablets (5-10 mg total) by mouth every 4 (four) hours as needed for breakthrough pain or moderate pain.   polyethylene glycol 17 g packet Commonly known as: MIRALAX / GLYCOLAX Take 17 g by mouth daily.  senna-docusate 8.6-50 MG tablet Commonly known as: Senokot-S Take 1 tablet by mouth at bedtime.   sitaGLIPtin 50 MG tablet Commonly known as: Januvia Take 1 tablet (50 mg total) by mouth daily.   VITAMIN B-12 PO Take 2,500 mcg by mouth daily.   Xtandi 40 MG tablet Generic drug: enzalutamide Take 120 mg by mouth daily.       Allergies  Allergen Reactions   Beef-Derived Products     vegetarian   Chicken Allergy    Fish Allergy    Pork-Derived Products       The results of significant diagnostics from this hospitalization (including imaging, microbiology, ancillary and laboratory) are listed below for reference.    Significant Diagnostic Studies: DG Knee Right Port  Result Date: 02/13/2022 CLINICAL DATA:  Lateral right knee pain EXAM: PORTABLE RIGHT KNEE - 2 VIEW COMPARISON:  X-ray dated October 10th 2023 FINDINGS: No evidence of acute fracture or  dislocation. Moderate joint effusion. Severe tricompartmental degenerative changes, most pronounced at the medial and patellofemoral compartments. Partially visualized intramedullary rod of the femur. Diffuse demineralization. Soft tissues are unremarkable. IMPRESSION: 1. No acute fracture or dislocation. 2. Moderate joint effusion. 3. Severe tricompartmental degenerative changes. Electronically Signed   By: Yetta Glassman M.D.   On: 02/13/2022 16:17   ECHOCARDIOGRAM COMPLETE  Result Date: 02/12/2022    ECHOCARDIOGRAM REPORT   Patient Name:   ARRIE ZUERCHER Date of Exam: 02/12/2022 Medical Rec #:  [id number]         Height:       69.0 in Accession #:    [id number]        Weight:       140.0 lb Date of Birth:  1933/07/04          BSA:          1.775 m Patient Age:    27 years          BP:           110/62 mmHg Patient Gender: M                 HR:           89 bpm. Exam Location:  Outpatient Procedure: 2D Echo, Cardiac Doppler and Color Doppler Indications:    syncope  History:        Patient has prior history of Echocardiogram examinations.  Sonographer:    NA Referring Phys: [id number] [place]  1. Left ventricular ejection fraction, by estimation, is 65 to 70%. The left ventricle has hyperdynamic function. The left ventricle has no regional wall motion abnormalities. There is mild concentric left ventricular hypertrophy. Left ventricular diastolic parameters are consistent with Grade I diastolic dysfunction (impaired relaxation).  2. Right ventricular systolic function is normal. The right ventricular size is normal. There is normal pulmonary artery systolic pressure. The estimated right ventricular systolic pressure is 75.6 mmHg.  3. The mitral valve is normal in structure. No evidence of mitral valve regurgitation. No evidence of mitral stenosis.  4. The aortic valve is tricuspid. There is mild calcification of the aortic valve. Aortic valve regurgitation is trivial. No aortic stenosis is  present.  5. The inferior vena cava is normal in size with greater than 50% respiratory variability, suggesting right atrial pressure of 3 mmHg. FINDINGS  Left Ventricle: Left ventricular ejection fraction, by estimation, is 65 to 70%. The left ventricle has hyperdynamic function. The left ventricle has no regional wall motion abnormalities. The left  ventricular internal cavity size was normal in size. There is mild concentric left ventricular hypertrophy. Left ventricular diastolic parameters are consistent with Grade I diastolic dysfunction (impaired relaxation). Right Ventricle: The right ventricular size is normal. No increase in right ventricular wall thickness. Right ventricular systolic function is normal. There is normal pulmonary artery systolic pressure. The tricuspid regurgitant velocity is 2.27 m/s, and  with an assumed right atrial pressure of 3 mmHg, the estimated right ventricular systolic pressure is 67.1 mmHg. Left Atrium: Left atrial size was normal in size. Right Atrium: Right atrial size was normal in size. Pericardium: There is no evidence of pericardial effusion. Mitral Valve: The mitral valve is normal in structure. No evidence of mitral valve regurgitation. No evidence of mitral valve stenosis. Tricuspid Valve: The tricuspid valve is normal in structure. Tricuspid valve regurgitation is trivial. Aortic Valve: The aortic valve is tricuspid. There is mild calcification of the aortic valve. Aortic valve regurgitation is trivial. No aortic stenosis is present. Pulmonic Valve: The pulmonic valve was normal in structure. Pulmonic valve regurgitation is not visualized. Aorta: The aortic root is normal in size and structure. Venous: The inferior vena cava is normal in size with greater than 50% respiratory variability, suggesting right atrial pressure of 3 mmHg. IAS/Shunts: No atrial level shunt detected by color flow Doppler.  LEFT VENTRICLE PLAX 2D LVOT diam:     1.80 cm   Diastology LV SV:          45        LV e' medial:    6.84 cm/s LV SV Index:   25        LV E/e' medial:  7.2 LVOT Area:     2.54 cm  LV e' lateral:   10.40 cm/s                          LV E/e' lateral: 4.7  RIGHT VENTRICLE RV S prime:     24.20 cm/s TAPSE (M-mode): 1.7 cm LEFT ATRIUM             Index        RIGHT ATRIUM           Index LA Vol (A2C):   26.1 ml 14.70 ml/m  RA Area:     11.70 cm LA Vol (A4C):   30.3 ml 17.07 ml/m  RA Volume:   23.80 ml  13.41 ml/m LA Biplane Vol: 31.1 ml 17.52 ml/m  AORTIC VALVE LVOT Vmax:   105.00 cm/s LVOT Vmean:  72.900 cm/s LVOT VTI:    0.176 m  AORTA Ao Root diam: 3.20 cm MITRAL VALVE               TRICUSPID VALVE MV Area (PHT): 2.95 cm    TR Peak grad:   20.6 mmHg MV Decel Time: 257 msec    TR Vmax:        227.00 cm/s MV E velocity: 49.10 cm/s MV A velocity: 82.90 cm/s  SHUNTS MV E/A ratio:  0.59        Systemic VTI:  0.18 m                            Systemic Diam: 1.80 cm Dalton McleanMD Electronically signed by Franki Monte Signature Date/Time: 02/12/2022/4:12:11 PM    Final    CT Angio Chest Pulmonary Embolism (PE) W or WO Contrast  Addendum Date: 02/12/2022  ADDENDUM REPORT: 02/12/2022 10:58 ADDENDUM: Correction: There is a mildly displaced fracture of the left anterior sixth rib. Findings discussed with Dr. Verlon Au via telephone at 10:50 am. Electronically Signed   By: Margaretha Sheffield M.D.   On: 02/12/2022 10:58   Result Date: 02/12/2022 CLINICAL DATA:  Syncope/presyncope, cerebrovascular cause suspected EXAM: CT ANGIOGRAPHY CHEST WITH CONTRAST TECHNIQUE: Multidetector CT imaging of the chest was performed using the standard protocol during bolus administration of intravenous contrast. Multiplanar CT image reconstructions and MIPs were obtained to evaluate the vascular anatomy. RADIATION DOSE REDUCTION: This exam was performed according to the departmental dose-optimization program which includes automated exposure control, adjustment of the mA and/or kV according to patient  size and/or use of iterative reconstruction technique. CONTRAST:  70m OMNIPAQUE IOHEXOL 350 MG/ML SOLN COMPARISON:  Chest x-ray February 11, 2022. FINDINGS: Cardiovascular: Satisfactory opacification of the pulmonary arteries to the segmental level. No evidence of pulmonary embolism. Normal heart size. No pericardial effusion. Aortic atherosclerosis. Coronary artery atherosclerosis. Mediastinum/Nodes: No enlarged mediastinal, hilar, or axillary lymph nodes. Thyroid gland, trachea, and esophagus demonstrate no significant findings. Lungs/Pleura: Mild peripheral predominant fibrotic change in the lungs. No consolidation. No pleural effusions or pneumothorax. Upper Abdomen: No acute abnormality. Musculoskeletal: No acute fracture. Polyarticular degenerative change. Sclerotic lesion within the T12 vertebral body, compatible with osseous metastatic disease. Review of the MIP images confirms the above findings. IMPRESSION: 1. No evidence of acute pulmonary embolism to the segmental level. 2. Mild fibrotic change in the lungs without consolidation. 3. Sclerotic T12 osseous metastasis. 4. Aortic Atherosclerosis (ICD10-I70.0). Electronically Signed: By: FMargaretha SheffieldM.D. On: 02/12/2022 09:11   CT Head Wo Contrast  Result Date: 02/11/2022 CLINICAL DATA:  Trauma EXAM: CT HEAD WITHOUT CONTRAST CT MAXILLOFACIAL WITHOUT CONTRAST CT CERVICAL SPINE WITHOUT CONTRAST TECHNIQUE: Multidetector CT imaging of the head, cervical spine, and maxillofacial structures were performed using the standard protocol without intravenous contrast. Multiplanar CT image reconstructions of the cervical spine and maxillofacial structures were also generated. RADIATION DOSE REDUCTION: This exam was performed according to the departmental dose-optimization program which includes automated exposure control, adjustment of the mA and/or kV according to patient size and/or use of iterative reconstruction technique. COMPARISON:  CT head dated  02/02/2022 FINDINGS: CT HEAD FINDINGS Brain: No evidence of acute infarction, hemorrhage, hydrocephalus, extra-axial collection or mass lesion/mass effect. Global cortical atrophy. Mild subcortical white matter and periventricular small vessel ischemic changes. Vascular: No hyperdense vessel or unexpected calcification. Skull: Normal. Negative for fracture or focal lesion. Other: Small extracranial hematoma overlying the left parietal bone (series 3/image 17). CT MAXILLOFACIAL FINDINGS Osseous: No evidence of maxillofacial fracture. Nasal bones are intact. Mandible is intact. Bilateral mandibular condyles are well-seated in the TMJs. Orbits: Bilateral orbits, including the globes and retroconal soft tissues, are within normal limits. Sinuses: The visualized paranasal sinuses are essentially clear. The mastoid air cells are unopacified. Soft tissues: Soft tissue swelling/laceration overlying the nasal bridge, left greater than right (series 3/image 50). CT CERVICAL SPINE FINDINGS Alignment: Normal cervical lordosis. Skull base and vertebrae: No acute fracture. No primary bone lesion or focal pathologic process. Soft tissues and spinal canal: No prevertebral fluid or swelling. No visible canal hematoma. Disc levels: Mild degenerative changes of the mid cervical spine. Spinal canal is patent. Upper chest: Visualized lung apices are clear. Other: Visualized thyroid is unremarkable. IMPRESSION: Small extracranial hematoma overlying the left parietal bone. No evidence of calvarial fracture. No evidence of acute intracranial abnormality. Atrophy with mild small vessel ischemic changes. Soft tissue swelling/laceration  overlying the nasal bridge, left greater than right. No evidence of maxillofacial fracture. No traumatic injury to the cervical spine. Mild degenerative changes. Electronically Signed   By: Julian Hy M.D.   On: 02/11/2022 23:15   CT Cervical Spine Wo Contrast  Result Date: 02/11/2022 CLINICAL  DATA:  Trauma EXAM: CT HEAD WITHOUT CONTRAST CT MAXILLOFACIAL WITHOUT CONTRAST CT CERVICAL SPINE WITHOUT CONTRAST TECHNIQUE: Multidetector CT imaging of the head, cervical spine, and maxillofacial structures were performed using the standard protocol without intravenous contrast. Multiplanar CT image reconstructions of the cervical spine and maxillofacial structures were also generated. RADIATION DOSE REDUCTION: This exam was performed according to the departmental dose-optimization program which includes automated exposure control, adjustment of the mA and/or kV according to patient size and/or use of iterative reconstruction technique. COMPARISON:  CT head dated 02/02/2022 FINDINGS: CT HEAD FINDINGS Brain: No evidence of acute infarction, hemorrhage, hydrocephalus, extra-axial collection or mass lesion/mass effect. Global cortical atrophy. Mild subcortical white matter and periventricular small vessel ischemic changes. Vascular: No hyperdense vessel or unexpected calcification. Skull: Normal. Negative for fracture or focal lesion. Other: Small extracranial hematoma overlying the left parietal bone (series 3/image 17). CT MAXILLOFACIAL FINDINGS Osseous: No evidence of maxillofacial fracture. Nasal bones are intact. Mandible is intact. Bilateral mandibular condyles are well-seated in the TMJs. Orbits: Bilateral orbits, including the globes and retroconal soft tissues, are within normal limits. Sinuses: The visualized paranasal sinuses are essentially clear. The mastoid air cells are unopacified. Soft tissues: Soft tissue swelling/laceration overlying the nasal bridge, left greater than right (series 3/image 50). CT CERVICAL SPINE FINDINGS Alignment: Normal cervical lordosis. Skull base and vertebrae: No acute fracture. No primary bone lesion or focal pathologic process. Soft tissues and spinal canal: No prevertebral fluid or swelling. No visible canal hematoma. Disc levels: Mild degenerative changes of the mid  cervical spine. Spinal canal is patent. Upper chest: Visualized lung apices are clear. Other: Visualized thyroid is unremarkable. IMPRESSION: Small extracranial hematoma overlying the left parietal bone. No evidence of calvarial fracture. No evidence of acute intracranial abnormality. Atrophy with mild small vessel ischemic changes. Soft tissue swelling/laceration overlying the nasal bridge, left greater than right. No evidence of maxillofacial fracture. No traumatic injury to the cervical spine. Mild degenerative changes. Electronically Signed   By: Julian Hy M.D.   On: 02/11/2022 23:15   CT Maxillofacial WO CM  Result Date: 02/11/2022 CLINICAL DATA:  Trauma EXAM: CT HEAD WITHOUT CONTRAST CT MAXILLOFACIAL WITHOUT CONTRAST CT CERVICAL SPINE WITHOUT CONTRAST TECHNIQUE: Multidetector CT imaging of the head, cervical spine, and maxillofacial structures were performed using the standard protocol without intravenous contrast. Multiplanar CT image reconstructions of the cervical spine and maxillofacial structures were also generated. RADIATION DOSE REDUCTION: This exam was performed according to the departmental dose-optimization program which includes automated exposure control, adjustment of the mA and/or kV according to patient size and/or use of iterative reconstruction technique. COMPARISON:  CT head dated 02/02/2022 FINDINGS: CT HEAD FINDINGS Brain: No evidence of acute infarction, hemorrhage, hydrocephalus, extra-axial collection or mass lesion/mass effect. Global cortical atrophy. Mild subcortical white matter and periventricular small vessel ischemic changes. Vascular: No hyperdense vessel or unexpected calcification. Skull: Normal. Negative for fracture or focal lesion. Other: Small extracranial hematoma overlying the left parietal bone (series 3/image 17). CT MAXILLOFACIAL FINDINGS Osseous: No evidence of maxillofacial fracture. Nasal bones are intact. Mandible is intact. Bilateral mandibular  condyles are well-seated in the TMJs. Orbits: Bilateral orbits, including the globes and retroconal soft tissues, are within normal limits. Sinuses:  The visualized paranasal sinuses are essentially clear. The mastoid air cells are unopacified. Soft tissues: Soft tissue swelling/laceration overlying the nasal bridge, left greater than right (series 3/image 50). CT CERVICAL SPINE FINDINGS Alignment: Normal cervical lordosis. Skull base and vertebrae: No acute fracture. No primary bone lesion or focal pathologic process. Soft tissues and spinal canal: No prevertebral fluid or swelling. No visible canal hematoma. Disc levels: Mild degenerative changes of the mid cervical spine. Spinal canal is patent. Upper chest: Visualized lung apices are clear. Other: Visualized thyroid is unremarkable. IMPRESSION: Small extracranial hematoma overlying the left parietal bone. No evidence of calvarial fracture. No evidence of acute intracranial abnormality. Atrophy with mild small vessel ischemic changes. Soft tissue swelling/laceration overlying the nasal bridge, left greater than right. No evidence of maxillofacial fracture. No traumatic injury to the cervical spine. Mild degenerative changes. Electronically Signed   By: Julian Hy M.D.   On: 02/11/2022 23:15   DG Chest 1 View  Result Date: 02/11/2022 CLINICAL DATA:  Fall with pain in right leg. EXAM: CHEST  1 VIEW COMPARISON:  Radiograph 02/02/2022 FINDINGS: Lower lung volumes from prior exam. Stable heart size and mediastinal contours allowing for differences in technique. No pneumothorax, large pleural effusion or focal airspace disease. No acute osseous findings. IMPRESSION: Low lung volumes without acute abnormality. Electronically Signed   By: Keith Rake M.D.   On: 02/11/2022 20:31   DG Pelvis 1-2 Views  Result Date: 02/11/2022 CLINICAL DATA:  Post fall with right leg pain. EXAM: PELVIS - 1-2 VIEW COMPARISON:  None Available. FINDINGS: There is slight  cortical offset of the right superior pubic ramus which may represent a nondisplaced fracture. No convincing inferior pubic ramus fracture. Femoral head is well seated in the acetabulum. Surgical hardware in the right proximal femur is intact. Pubic symphysis and sacroiliac joints are congruent. IMPRESSION: Findings suspicious for right superior pubic ramus fracture. Electronically Signed   By: Keith Rake M.D.   On: 02/11/2022 20:30   DG Femur Min 2 Views Right  Result Date: 02/11/2022 CLINICAL DATA:  Fall, pain in right leg. EXAM: RIGHT FEMUR 2 VIEWS COMPARISON:  Radiograph 10/12/2019 FINDINGS: Intramedullary nail with trans trochanteric and distal locking screw fixation. The previous intertrochanteric femur fracture has healed. The trans trochanteric screws again project into the lateral soft tissues by approximately 15 mm. There is no acute or periprosthetic fracture. The femoral head is seated in the acetabulum. Degenerative change of the hip and knee. IMPRESSION: 1. No acute fracture of the right femur. 2. Intramedullary nail with trans trochanteric and distal locking screw fixation, healed intertrochanteric fracture. Electronically Signed   By: Keith Rake M.D.   On: 02/11/2022 20:28   CT HEAD WO CONTRAST (5MM)  Result Date: 02/02/2022 CLINICAL DATA:  Fall.  Head trauma. EXAM: CT HEAD WITHOUT CONTRAST TECHNIQUE: Contiguous axial images were obtained from the base of the skull through the vertex without intravenous contrast. RADIATION DOSE REDUCTION: This exam was performed according to the departmental dose-optimization program which includes automated exposure control, adjustment of the mA and/or kV according to patient size and/or use of iterative reconstruction technique. COMPARISON:  None Available. FINDINGS: Brain: No evidence of acute infarction, hemorrhage, hydrocephalus, extra-axial collection or mass lesion/mass effect. Mild ventricular sulcal enlargement reflecting age-appropriate  volume loss. Vascular: No hyperdense vessel or unexpected calcification. Skull: Normal. Negative for fracture or focal lesion. Sinuses/Orbits: Globes and orbits are unremarkable. Visualized sinuses are clear. Other: None. IMPRESSION: 1. No acute intracranial abnormalities. Electronically Signed  By: Lajean Manes M.D.   On: 02/02/2022 16:59   DG Chest Portable 1 View  Result Date: 02/02/2022 CLINICAL DATA:  Syncope.  Fatigue.  Fall today. EXAM: PORTABLE CHEST 1 VIEW COMPARISON:  11/19/2016. FINDINGS: Cardiac silhouette is normal in size. No mediastinal or hilar masses. Clear lungs.  No pleural effusion or pneumothorax. Skeletal structures are grossly intact. IMPRESSION: No active disease. Electronically Signed   By: Lajean Manes M.D.   On: 02/02/2022 16:48    Microbiology: Recent Results (from the past 240 hour(s))  Resp Panel by RT-PCR (Flu A&B, Covid)     Status: Abnormal   Collection Time: 02/11/22  7:30 PM   Specimen: Nasal Swab  Result Value Ref Range Status   SARS Coronavirus 2 by RT PCR POSITIVE (A) NEGATIVE Final    Comment: (NOTE) SARS-CoV-2 target nucleic acids are DETECTED.  The SARS-CoV-2 RNA is generally detectable in upper respiratory specimens during the acute phase of infection. Positive results are indicative of the presence of the identified virus, but do not rule out bacterial infection or co-infection with other pathogens not detected by the test. Clinical correlation with patient history and other diagnostic information is necessary to determine patient infection status. The expected result is Negative.  Fact Sheet for Patients: EntrepreneurPulse.com.au  Fact Sheet for Healthcare Providers: IncredibleEmployment.be  This test is not yet approved or cleared by the [place] FDA and  has been authorized for detection and/or diagnosis of SARS-CoV-2 by FDA under an Emergency Use Authorization (EUA).  This EUA will remain in  effect (meaning this test can be used) for the duration of  the COVID-19 declaration under Section 564(b)(1) of the A ct, 21 U.S.C. section 360bbb-3(b)(1), unless the authorization is terminated or revoked sooner.     Influenza A by PCR NEGATIVE NEGATIVE Final   Influenza B by PCR NEGATIVE NEGATIVE Final    Comment: (NOTE) The Xpert Xpress SARS-CoV-2/FLU/RSV plus assay is intended as an aid in the diagnosis of influenza from Nasopharyngeal swab specimens and should not be used as a sole basis for treatment. Nasal washings and aspirates are unacceptable for Xpert Xpress SARS-CoV-2/FLU/RSV testing.  Fact Sheet for Patients: EntrepreneurPulse.com.au  Fact Sheet for Healthcare Providers: IncredibleEmployment.be  This test is not yet approved or cleared by the [place] FDA and has been authorized for detection and/or diagnosis of SARS-CoV-2 by FDA under an Emergency Use Authorization (EUA). This EUA will remain in effect (meaning this test can be used) for the duration of the COVID-19 declaration under Section 564(b)(1) of the Act, 21 U.S.C. section 360bbb-3(b)(1), unless the authorization is terminated or revoked.  Performed at [facility], [place] [address] [place],  [address]      Labs: Basic Metabolic Panel: Recent Labs  Lab 02/11/22 1940 02/11/22 1951 02/11/22 2354 02/12/22 0500 02/13/22 0225  NA 130* 130*  --  133* 136  K 5.1 5.0  --  4.6 4.6  CL 95* 99  --  99 104  CO2 23  --   --  24 22  GLUCOSE 172* 170*  --  176* 122*  BUN 27* 28*  --  23 22  CREATININE 1.62* 1.70* 1.54* 1.54* 1.39*  CALCIUM 10.2  --   --  9.3 9.2  MG  --   --   --  1.9  --   PHOS  --   --   --  3.9  --    Liver Function Tests: Recent Labs  Lab 02/11/22  1940 02/12/22 0500  AST 37  --   ALT 21  --   ALKPHOS 79  --   BILITOT 1.0  --   PROT 7.5  --   ALBUMIN 3.7 2.8*   No results for input(s): "LIPASE", "AMYLASE" in the last 168  hours. No results for input(s): "AMMONIA" in the last 168 hours. CBC: Recent Labs  Lab 02/11/22 1940 02/11/22 1951 02/11/22 2354 02/12/22 0500 02/13/22 0225  WBC 8.3  --  6.2 5.6 5.4  NEUTROABS 6.4  --   --   --  2.8  HGB 11.1* 11.9* 9.9* 8.8* 8.4*  HCT 34.3* 35.0* 31.2* 26.5* 25.4*  MCV 86.8  --  88.4 85.8 85.2  PLT 248  --  218 199 159   Cardiac Enzymes: Recent Labs  Lab 02/11/22 1940  CKTOTAL 331   BNP: BNP (last 3 results) Recent Labs    02/02/22 1600  BNP 24.8    ProBNP (last 3 results) No results for input(s): "PROBNP" in the last 8760 hours.  CBG: Recent Labs  Lab 02/13/22 0841 02/13/22 1157 02/13/22 1634 02/13/22 2125 02/14/22 0806  GLUCAP 161* 169* 200* 145* 143*       Signed:  Nita Sells MD   [facility] 02/14/2022, 9:49 AM

## 2022-02-14 NOTE — Plan of Care (Signed)

## 2022-02-14 NOTE — TOC Progression Note (Addendum)
Transition of Care [facility]) - Progression Note    Patient Details  Name: Marco [name] MRN: [id number] Date of Birth: 1934/04/02  Transition of Care [facility]) CM/SW Contact  Reece Agar, [place] Phone Number: 02/14/2022, 11:14 AM  Clinical Narrative:    CSW spoke with pt and son at bedside. Pt son would like for the pt to complete PT at CIR. Son would like to speak with CIR before agreeing to SNF. He has not chose a bed for SNF.  CSW reached out to the [facility] department to review pt, they are not able to accept pt (please see note form Mickel Baas).  CSW spoke with pt son to provide update on available SNF's, he asked for [facility], CSW informed him that they did not offer. CSW contacted [facility] and the the medical director is not agreeable to take pt. Pt would like to contact his niece to help him decide on what to do bc he is also considering [place] if he could get a agency to sit with pt.  NCM came in to  speak with pt and family about [facility] and the process of getting an aide through medicare. Family would like SNF.  Family said they were told pt could go to [facility] when they informed the SNF that they would provide the cancer meds. CSW followed up with [facility] they are still not able to accept pt for other reasons. CSW went over the list with family in the room of acceptances, the family would like for CSW to try [facility] or [facility]. [facility] is reviewing but they will not have a bed until possibly Monday. CSW is waiting to hear from [facility] on if they cant take him over the weekend.  CSW spoke with [facility] about pt bed offer. They are able to accept pt with a DC summary before 2pm and a approved auth. CSW started auth ref# [id number]. CSW informed pt of the bed offer and they are concerned about pt not having a private room. Family is still trying to get [facility] to accept pt. CSW explained that pt is ready for DC and to discuss a facility change with the social  worker/administration at the SNF.     Expected Discharge Plan: [place] Barriers to Discharge: Continued Medical Work up, SNF Pending bed offer  Expected Discharge Plan and Services Expected Discharge Plan: [place] In-house Referral: Clinical Social Work   Post Acute Care Choice: [employer] arrangements for the past 2 months: Single Family Home Expected Discharge Date: 02/14/22                                     Social Determinants of Health (SDOH) Interventions    Readmission Risk Interventions     No data to display

## 2022-02-14 NOTE — Progress Notes (Signed)
Inpatient Rehab Admissions Coordinator:   Per [facility] request, patient was screened for CIR candidacy by Clemens Catholic, MS, CCC-SLP. At this time, Pt. does not appear to demonstrate medical necessity to justify in hospital rehabilitation/CIR. Additionally, PT/OT are recommending SNF and patient's medicare advantage plan will not approve CIR for a pubic rami fracture. I  will not pursue a rehab consult for this Pt.   Recommend other rehab venues to be pursued.  Please contact me with any questions.   Clemens Catholic, [place], [place] Admissions Coordinator  [phone] ([place]) [phone] (office)

## 2022-02-14 NOTE — Progress Notes (Signed)
Mobility Specialist - Progress Note   02/14/22 1414  Mobility  Activity Transferred from chair to bed  Level of Assistance Minimal assist, patient does 75% or more  Assistive Device Front wheel walker  Distance Ambulated (ft) 5 ft  RLE Weight Bearing WBAT  Activity Response Tolerated well  $Mobility charge 1 Mobility    Pt received in recliner requesting to return to bed. Left in bed w/ call bell in reach and all needs met.   Sydney Joyce Mobility Specialist  

## 2022-02-14 NOTE — Progress Notes (Signed)
Mobility Specialist - Progress Note   02/14/22 1206  Mobility  Activity Ambulated with assistance in room  Level of Assistance Minimal assist, patient does 75% or more  Assistive Device Front wheel walker  Distance Ambulated (ft) 10 ft  RLE Weight Bearing WBAT  Activity Response Tolerated well  $Mobility charge 1 Mobility    Pt received in bed agreeable to mobility. Left in recliner w/ call bell in reach and all needs met.   Paulla Dolly Mobility Specialist

## 2022-02-15 ENCOUNTER — Encounter: Payer: Self-pay | Admitting: Family Medicine

## 2022-02-15 DIAGNOSIS — M1990 Unspecified osteoarthritis, unspecified site: Secondary | ICD-10-CM | POA: Diagnosis not present

## 2022-02-15 DIAGNOSIS — D519 Vitamin B12 deficiency anemia, unspecified: Secondary | ICD-10-CM | POA: Diagnosis not present

## 2022-02-15 DIAGNOSIS — Z7984 Long term (current) use of oral hypoglycemic drugs: Secondary | ICD-10-CM | POA: Diagnosis not present

## 2022-02-15 DIAGNOSIS — E039 Hypothyroidism, unspecified: Secondary | ICD-10-CM | POA: Diagnosis not present

## 2022-02-15 DIAGNOSIS — I5032 Chronic diastolic (congestive) heart failure: Secondary | ICD-10-CM | POA: Diagnosis not present

## 2022-02-15 DIAGNOSIS — Z23 Encounter for immunization: Secondary | ICD-10-CM | POA: Diagnosis not present

## 2022-02-15 DIAGNOSIS — W109XXA Fall (on) (from) unspecified stairs and steps, initial encounter: Secondary | ICD-10-CM | POA: Diagnosis not present

## 2022-02-15 DIAGNOSIS — I2510 Atherosclerotic heart disease of native coronary artery without angina pectoris: Secondary | ICD-10-CM | POA: Diagnosis not present

## 2022-02-15 DIAGNOSIS — Z743 Need for continuous supervision: Secondary | ICD-10-CM | POA: Diagnosis not present

## 2022-02-15 DIAGNOSIS — E1122 Type 2 diabetes mellitus with diabetic chronic kidney disease: Secondary | ICD-10-CM | POA: Diagnosis not present

## 2022-02-15 DIAGNOSIS — E119 Type 2 diabetes mellitus without complications: Secondary | ICD-10-CM | POA: Diagnosis not present

## 2022-02-15 DIAGNOSIS — M170 Bilateral primary osteoarthritis of knee: Secondary | ICD-10-CM | POA: Diagnosis not present

## 2022-02-15 DIAGNOSIS — Z7401 Bed confinement status: Secondary | ICD-10-CM | POA: Diagnosis not present

## 2022-02-15 DIAGNOSIS — I951 Orthostatic hypotension: Secondary | ICD-10-CM | POA: Diagnosis not present

## 2022-02-15 DIAGNOSIS — N183 Chronic kidney disease, stage 3 unspecified: Secondary | ICD-10-CM | POA: Diagnosis not present

## 2022-02-15 DIAGNOSIS — S3210XD Unspecified fracture of sacrum, subsequent encounter for fracture with routine healing: Secondary | ICD-10-CM | POA: Diagnosis not present

## 2022-02-15 DIAGNOSIS — Y92009 Unspecified place in unspecified non-institutional (private) residence as the place of occurrence of the external cause: Secondary | ICD-10-CM | POA: Diagnosis not present

## 2022-02-15 DIAGNOSIS — E785 Hyperlipidemia, unspecified: Secondary | ICD-10-CM | POA: Diagnosis not present

## 2022-02-15 DIAGNOSIS — R531 Weakness: Secondary | ICD-10-CM | POA: Diagnosis not present

## 2022-02-15 DIAGNOSIS — S32591D Other specified fracture of right pubis, subsequent encounter for fracture with routine healing: Secondary | ICD-10-CM | POA: Diagnosis not present

## 2022-02-15 DIAGNOSIS — E1169 Type 2 diabetes mellitus with other specified complication: Secondary | ICD-10-CM | POA: Diagnosis not present

## 2022-02-15 DIAGNOSIS — W19XXXA Unspecified fall, initial encounter: Secondary | ICD-10-CM | POA: Diagnosis not present

## 2022-02-15 DIAGNOSIS — M810 Age-related osteoporosis without current pathological fracture: Secondary | ICD-10-CM | POA: Diagnosis not present

## 2022-02-15 DIAGNOSIS — E538 Deficiency of other specified B group vitamins: Secondary | ICD-10-CM | POA: Diagnosis not present

## 2022-02-15 LAB — BASIC METABOLIC PANEL
Anion gap: 6 (ref 5–15)
BUN: 23 mg/dL (ref 8–23)
CO2: 24 mmol/L (ref 22–32)
Calcium: 9.2 mg/dL (ref 8.9–10.3)
Chloride: 103 mmol/L (ref 98–111)
Creatinine, Ser: 1.46 mg/dL — ABNORMAL HIGH (ref 0.61–1.24)
GFR, Estimated: 47 mL/min — ABNORMAL LOW (ref >60)
Glucose, Bld: 119 mg/dL — ABNORMAL HIGH (ref 70–99)
Potassium: 4.6 mmol/L (ref 3.5–5.1)
Sodium: 133 mmol/L — ABNORMAL LOW (ref 135–145)

## 2022-02-15 LAB — GLUCOSE, CAPILLARY
Glucose-Capillary: 140 mg/dL — ABNORMAL HIGH (ref 70–99)
Glucose-Capillary: 185 mg/dL — ABNORMAL HIGH (ref 70–99)
Glucose-Capillary: 246 mg/dL — ABNORMAL HIGH (ref 70–99)
Glucose-Capillary: 143 mg/dL — ABNORMAL HIGH (ref 70–99)

## 2022-02-15 MED ORDER — FLEET ENEMA 7-19 GM/118ML RE ENEM
1.0000 | ENEMA | Freq: Once | RECTAL | Status: AC
  Administered 2022-02-15: 1 via RECTAL
  Filled 2022-02-15: qty 1

## 2022-02-15 NOTE — Progress Notes (Signed)
Call report attempted 3 times to [facility] center.

## 2022-02-15 NOTE — Progress Notes (Signed)
This RN tried calling again for report but no answer, A/C made aware. Will continue to call.

## 2022-02-15 NOTE — Progress Notes (Signed)
Patient d/c to [facility] via [facility], A&O at the time of d/c. Denied any pain or discomfort. All concerns and questions addressed. All belongings sent with son. Multiples call made to give report to the receiving RN but no answer. This RN direct phone number provided for the RN to call if any question.

## 2022-02-15 NOTE — Progress Notes (Signed)
This RN attempted to [facility] rehab center on the social Worker provided in chart twice and no one is answering the phone. Will continue to call for report.

## 2022-02-15 NOTE — TOC Transition Note (Addendum)
Transition of Care [facility]) - CM/SW Discharge Note   Patient Details  Name: ARZELL MCGEEHAN MRN: [id number] Date of Birth: 1934/02/15  Transition [facility]) CM/SW Contact:  Milinda Antis, [place] Phone Number: 02/15/2022, 12:35 PM   Clinical Narrative:    Patient will [facility] to: [facility] Anticipated DC date: 02/15/2022 Family notified: Yes Transport by:  EMS   Per MD patient ready for DC to SNF. RN to call report prior to discharge [phone] room 312A). RN, patient, patient's family, and facility notified of DC. Discharge Summary sent to facility. DC packet on chart. Ambulance transport requested for patient.   13:56-  CSW met with the patient's son and explained the SNF process in depth.  CSW explained that the patient is assigned to go to a semi private room.  The son reports that the facility informed family that the patient would get a private room.  CSW encouraged the patient to speak with the facility when they arrive as this was not the information given to CSW.  CSW called the facility once again to verify that it would be a semi-private room and informed the son.  CSW also informed the son that the facility [facility] would need the patient's cancer medication and another medication.  The son informed CSW that son has the medication on him and will take to the facility.    CSW will sign off for now as social work intervention is no longer needed. Please consult [place] again if new needs arise.     Final next level of care: Skilled Nursing Facility Barriers to Discharge: Barriers Resolved   Patient Goals and CMS Choice Patient states their goals for this hospitalization and ongoing recovery are:: be able to walk with cane CMS Medicare.gov Compare Post Acute Care list provided to:: Patient Represenative (must comment) (son [name]) Choice offered to / list presented to : Adult Children  Discharge Placement              Patient chooses bed at:  [facility]) Patient to be  transferred to facility by: EMS Name of family member notified: Mimbs,[name] (Son)   [phone] Patient and family notified of of transfer: 02/15/22  Discharge Plan and Services In-house Referral: Clinical Social Work   Post Acute Care Choice: [place]                               Social Determinants of Health (SDOH) Interventions     Readmission Risk Interventions     No data to display

## 2022-02-15 NOTE — Progress Notes (Unsigned)
{  Select_[email or web address]:[id number]} 

## 2022-02-15 NOTE — Progress Notes (Signed)
This RN tried to call again to give report but no answer. Will continue to call.

## 2022-02-15 NOTE — Discharge Summary (Signed)
Physician Discharge Summary  Marco [name] CVE:[id number] DOB: 1934-03-10 DOA: 02/11/2022  PCP: Colon Branch, MD  Patient seen and examined at bedside 10/15 and appropriate changes made to below physical exam-no overt changes to overall plan--does need a stronger laxative  Admit date: 02/11/2022 Discharge date: 02/15/2022  Time spent: 27 minutes  Recommendations for Outpatient Follow-up:  Patient will discharge to skilled facility for progressive ambulation and can be weightbearing as tolerated without restriction Pain control first choice Tylenol 1000 mg, can use Percocet 1 to 2 tablets for breakthrough/severe pain Needs Chem-12 CBC in about 1 week Recommend outpatient follow-up with specialists urology, Dr. Abner Greenspan, ophthalmology Dr. Bing Plume  Discharge Diagnoses:  MAIN problem for hospitalization   Sacral insufficiency fracture with orthostatic syncope causing this Prostate cancer with no evidence of any metastases Recently recovered from [place] does not need isolation CKD 3 AA with no hyperkalemia Prior normal stress test and left distant past DM TY 2  Please see below for itemized issues addressed in HOpsital- refer to other progress notes for clarity if needed  Discharge Condition: Improved  Diet recommendation: Diabetic vegetarian  There were no vitals filed for this visit.  History of present illness:  88 year old community dwelling.male CAD/05/2016 stress test indeterminant Follows for amaurosis fugax Dr Bing Plume 10/2015 CKD stage II GFR baseline 1.5  Elevated PSA prostate nodule on Xtandi Postherpetic neuralgia on gabapentin until recently   Recent diagnosis of COVID 9/19 went to [facility] [place]-recovered completely. Subsequently seen again [facility] ED 10/1 syncope fell onto his back was initially bradycardic orthostatic position change--resuscitated with saline-potassium is slightly high, troponin negative, BNP negative   Visited with PCP 10/3 and was taken off  gabapentin, recommended Lokelma and recommended fluid hydration given orthostasis when in the ED   Re-presented again 02/11/2022 falling backwards down 6 stairs after getting dizzy + LOC,?  Unconscious, he was oriented on arrival-was found down for 2 to 3 hours after fall   Work-up in ED negative CT head negative CT cervical spine x-ray right hip = right ramus fracture Sodium 130 BUN/creatinine 28/1.7, troponin 10 CRP 0.9, hemoglobin baseline 10 on arrival 8.8 D-dimer 9.3  Hospital Course:  Syncope 2/2 orthostatic BP Dimer elevated 9.3-underlying history of prostate cancer--CT chest no PE Orthostatics + on evaluation and will require slow steady ambulation and education about this on discharge from facility by skilled MD, skilled therapy services   Right ramus pubis fracture Rib #'s Previously using only a cane but has been using a walker at least for a year according to son-he is unable to straight leg raise Also does have contusion above R knee laterally--he does not have any fracture of the right knee-the wound on the lateral side of the right knee can be dressed with a simple dressing he has some swelling Pain control Tylenol $RemoveBeforeD'1000mg'[id number]$  first choice, oxycodone 5 every 4 as needed for moderate/breakthrough Pain is controlled on oral meds-transiently on Dilaudid 0.5 IV every 4 as needed for severe pain but not requiring now   Recent COVID Fully recovered this was in 9/19-outside of 10-day isolation so does not need precautions   Prostate cancer on Levada Dy per UPTOdate has9 to 12% risk of dizziness--we will hold that-D/W Dr. Vikki Ports of urology and this is not a common side effect (usually more hot flashes) Resumed at d/c   DM TY 2 Continue Januvia 50/linigliptin daily--if creatinine reliably resolves and stays below 1.4, resume at d/c otherwise hold  CBG all below 180  Prior amaurosis fugax 2017 Imaging of head performed 10/1 showed no specific findings   Prior hyperkalemia no  longer on treatment CKD 3 AA No acute component at this time  Saline lock IV   Prior nonrevealing stress test 2018 Consider discussion outpatient discontinuation of aspirin   Discharge Exam: Vitals:   02/15/22 0606 02/15/22 0810  BP: 112/76 121/71  Pulse: 95 82  Resp: 18 16  Temp: 97.8 F (36.6 C) 98.7 F (37.1 C)  SpO2: 100% 100%    Subj on day of d/c   Well ambulated to RR with tech No fever Constipated ++ Added enema No cp   Discharge Instructions   Discharge Instructions     Diet - low sodium heart healthy   Complete by: As directed    Increase activity slowly   Complete by: As directed       Allergies as of 02/15/2022       Reactions   Beef-derived Products    vegetarian   Chicken Allergy    Fish Allergy    Pork-derived Products         Medication List     STOP taking these medications    Lokelma 10 g Pack packet Generic drug: sodium zirconium cyclosilicate       TAKE these medications    Accu-Chek Guide Control Liqd Use as directed   Accu-Chek Guide test strip Generic drug: glucose blood USE TO CHECK BLOOD SUGAR  ONCE DAILY   Accu-Chek Guide w/Device Kit Check blood sugars once daily   Accu-Chek Softclix Lancets lancets CHECK BLOOD SUGAR ONCE  DAILY AS DIRECTED   acetaminophen 500 MG tablet Commonly known as: TYLENOL Take 2 tablets (1,000 mg total) by mouth every 6 (six) hours as needed.   Alcohol Swabs Pads Use to clean finger prior to testing blood sugars   alendronate 70 MG tablet Commonly known as: FOSAMAX TAKE 1 TABLET(70 MG) BY MOUTH 1 TIME A WEEK WITH A FULL GLASS OF WATER AND ON AN EMPTY STOMACH   atorvastatin 40 MG tablet Commonly known as: LIPITOR Take 1 tablet (40 mg total) by mouth at bedtime.   Calcium 500 + D 500-3.125 MG-MCG Tabs Generic drug: Calcium Carb-Cholecalciferol Take 1 tablet by mouth daily.   ezetimibe 10 MG tablet Commonly known as: ZETIA Take 1 tablet (10 mg total) by mouth daily.    gabapentin 100 MG capsule Commonly known as: NEURONTIN Take 1 capsule (100 mg total) by mouth 2 (two) times daily.   levothyroxine 100 MCG tablet Commonly known as: SYNTHROID Take 1 tablet (100 mcg total) by mouth daily before breakfast.   metFORMIN 500 MG tablet Commonly known as: GLUCOPHAGE Take 1 tablet (500 mg total) by mouth daily. *take with a meal*   MULTI-VITAMIN DAILY PO Take 1 tablet by mouth daily.   nitroGLYCERIN 0.4 MG SL tablet Commonly known as: NITROSTAT Place 1 tablet (0.4 mg total) under the tongue every 5 (five) minutes x 3 doses as needed for chest pain.   oxyCODONE 5 MG immediate release tablet Commonly known as: Oxy IR/ROXICODONE Take 1-2 tablets (5-10 mg total) by mouth every 4 (four) hours as needed for breakthrough pain or moderate pain.   polyethylene glycol 17 g packet Commonly known as: MIRALAX / GLYCOLAX Take 17 g by mouth daily.   senna-docusate 8.6-50 MG tablet Commonly known as: Senokot-S Take 1 tablet by mouth at bedtime.   sitaGLIPtin 50 MG tablet Commonly known as: Januvia Take 1 tablet (50 mg total) by  mouth daily.   VITAMIN B-12 PO Take 2,500 mcg by mouth daily.   Xtandi 40 MG tablet Generic drug: enzalutamide Take 120 mg by mouth daily.       Allergies  Allergen Reactions   Beef-Derived Products     vegetarian   Chicken Allergy    Fish Allergy    Pork-Derived Products     Contact information for after-discharge care     [facility] .   Service: Skilled Nursing Contact information: [address] [place] [phone]                      The results of significant diagnostics from this hospitalization (including imaging, microbiology, ancillary and laboratory) are listed below for reference.    Significant Diagnostic Studies: DG Knee Right Port  Result Date: 02/13/2022 CLINICAL DATA:  Lateral right knee pain EXAM: PORTABLE  RIGHT KNEE - 2 VIEW COMPARISON:  X-ray dated October 10th 2023 FINDINGS: No evidence of acute fracture or dislocation. Moderate joint effusion. Severe tricompartmental degenerative changes, most pronounced at the medial and patellofemoral compartments. Partially visualized intramedullary rod of the femur. Diffuse demineralization. Soft tissues are unremarkable. IMPRESSION: 1. No acute fracture or dislocation. 2. Moderate joint effusion. 3. Severe tricompartmental degenerative changes. Electronically Signed   By: Yetta Glassman M.D.   On: 02/13/2022 16:17   ECHOCARDIOGRAM COMPLETE  Result Date: 02/12/2022    ECHOCARDIOGRAM REPORT   Patient Name:   Marco [name] Date of Exam: 02/12/2022 Medical Rec #:  [id number]         Height:       69.0 in Accession #:    [id number]        Weight:       140.0 lb Date of Birth:  1934-01-15          BSA:          1.775 m Patient Age:    60 years          BP:           110/62 mmHg Patient Gender: M                 HR:           89 bpm. Exam Location:  Outpatient Procedure: 2D Echo, Cardiac Doppler and Color Doppler Indications:    syncope  History:        Patient has prior history of Echocardiogram examinations.  Sonographer:    NA Referring Phys: [id number] Victor  1. Left ventricular ejection fraction, by estimation, is 65 to 70%. The left ventricle has hyperdynamic function. The left ventricle has no regional wall motion abnormalities. There is mild concentric left ventricular hypertrophy. Left ventricular diastolic parameters are consistent with Grade I diastolic dysfunction (impaired relaxation).  2. Right ventricular systolic function is normal. The right ventricular size is normal. There is normal pulmonary artery systolic pressure. The estimated right ventricular systolic pressure is 00.3 mmHg.  3. The mitral valve is normal in structure. No evidence of mitral valve regurgitation. No evidence of mitral stenosis.  4. The aortic valve is tricuspid. There  is mild calcification of the aortic valve. Aortic valve regurgitation is trivial. No aortic stenosis is present.  5. The inferior vena cava is normal in size with greater than 50% respiratory variability, suggesting right atrial pressure of 3 mmHg. FINDINGS  Left Ventricle: Left ventricular ejection  fraction, by estimation, is 65 to 70%. The left ventricle has hyperdynamic function. The left ventricle has no regional wall motion abnormalities. The left ventricular internal cavity size was normal in size. There is mild concentric left ventricular hypertrophy. Left ventricular diastolic parameters are consistent with Grade I diastolic dysfunction (impaired relaxation). Right Ventricle: The right ventricular size is normal. No increase in right ventricular wall thickness. Right ventricular systolic function is normal. There is normal pulmonary artery systolic pressure. The tricuspid regurgitant velocity is 2.27 m/s, and  with an assumed right atrial pressure of 3 mmHg, the estimated right ventricular systolic pressure is 96.7 mmHg. Left Atrium: Left atrial size was normal in size. Right Atrium: Right atrial size was normal in size. Pericardium: There is no evidence of pericardial effusion. Mitral Valve: The mitral valve is normal in structure. No evidence of mitral valve regurgitation. No evidence of mitral valve stenosis. Tricuspid Valve: The tricuspid valve is normal in structure. Tricuspid valve regurgitation is trivial. Aortic Valve: The aortic valve is tricuspid. There is mild calcification of the aortic valve. Aortic valve regurgitation is trivial. No aortic stenosis is present. Pulmonic Valve: The pulmonic valve was normal in structure. Pulmonic valve regurgitation is not visualized. Aorta: The aortic root is normal in size and structure. Venous: The inferior vena cava is normal in size with greater than 50% respiratory variability, suggesting right atrial pressure of 3 mmHg. IAS/Shunts: No atrial level shunt  detected by color flow Doppler.  LEFT VENTRICLE PLAX 2D LVOT diam:     1.80 cm   Diastology LV SV:         45        LV e' medial:    6.84 cm/s LV SV Index:   25        LV E/e' medial:  7.2 LVOT Area:     2.54 cm  LV e' lateral:   10.40 cm/s                          LV E/e' lateral: 4.7  RIGHT VENTRICLE RV S prime:     24.20 cm/s TAPSE (M-mode): 1.7 cm LEFT ATRIUM             Index        RIGHT ATRIUM           Index LA Vol (A2C):   26.1 ml 14.70 ml/m  RA Area:     11.70 cm LA Vol (A4C):   30.3 ml 17.07 ml/m  RA Volume:   23.80 ml  13.41 ml/m LA Biplane Vol: 31.1 ml 17.52 ml/m  AORTIC VALVE LVOT Vmax:   105.00 cm/s LVOT Vmean:  72.900 cm/s LVOT VTI:    0.176 m  AORTA Ao Root diam: 3.20 cm MITRAL VALVE               TRICUSPID VALVE MV Area (PHT): 2.95 cm    TR Peak grad:   20.6 mmHg MV Decel Time: 257 msec    TR Vmax:        227.00 cm/s MV E velocity: 49.10 cm/s MV A velocity: 82.90 cm/s  SHUNTS MV E/A ratio:  0.59        Systemic VTI:  0.18 m                            Systemic Diam: 1.80 cm Dalton McleanMD Electronically signed by Franki Monte Signature Date/Time:  02/12/2022/4:12:11 PM    Final    CT Angio Chest Pulmonary Embolism (PE) W or WO Contrast  Addendum Date: 02/12/2022   ADDENDUM REPORT: 02/12/2022 10:58 ADDENDUM: Correction: There is a mildly displaced fracture of the left anterior sixth rib. Findings discussed with Dr. Verlon Au via telephone at 10:50 am. Electronically Signed   By: Margaretha Sheffield M.D.   On: 02/12/2022 10:58   Result Date: 02/12/2022 CLINICAL DATA:  Syncope/presyncope, cerebrovascular cause suspected EXAM: CT ANGIOGRAPHY CHEST WITH CONTRAST TECHNIQUE: Multidetector CT imaging of the chest was performed using the standard protocol during bolus administration of intravenous contrast. Multiplanar CT image reconstructions and MIPs were obtained to evaluate the vascular anatomy. RADIATION DOSE REDUCTION: This exam was performed according to the departmental  dose-optimization program which includes automated exposure control, adjustment of the mA and/or kV according to patient size and/or use of iterative reconstruction technique. CONTRAST:  4m OMNIPAQUE IOHEXOL 350 MG/ML SOLN COMPARISON:  Chest x-ray February 11, 2022. FINDINGS: Cardiovascular: Satisfactory opacification of the pulmonary arteries to the segmental level. No evidence of pulmonary embolism. Normal heart size. No pericardial effusion. Aortic atherosclerosis. Coronary artery atherosclerosis. Mediastinum/Nodes: No enlarged mediastinal, hilar, or axillary lymph nodes. Thyroid gland, trachea, and esophagus demonstrate no significant findings. Lungs/Pleura: Mild peripheral predominant fibrotic change in the lungs. No consolidation. No pleural effusions or pneumothorax. Upper Abdomen: No acute abnormality. Musculoskeletal: No acute fracture. Polyarticular degenerative change. Sclerotic lesion within the T12 vertebral body, compatible with osseous metastatic disease. Review of the MIP images confirms the above findings. IMPRESSION: 1. No evidence of acute pulmonary embolism to the segmental level. 2. Mild fibrotic change in the lungs without consolidation. 3. Sclerotic T12 osseous metastasis. 4. Aortic Atherosclerosis (ICD10-I70.0). Electronically Signed: By: FMargaretha SheffieldM.D. On: 02/12/2022 09:11   CT Head Wo Contrast  Result Date: 02/11/2022 CLINICAL DATA:  Trauma EXAM: CT HEAD WITHOUT CONTRAST CT MAXILLOFACIAL WITHOUT CONTRAST CT CERVICAL SPINE WITHOUT CONTRAST TECHNIQUE: Multidetector CT imaging of the head, cervical spine, and maxillofacial structures were performed using the standard protocol without intravenous contrast. Multiplanar CT image reconstructions of the cervical spine and maxillofacial structures were also generated. RADIATION DOSE REDUCTION: This exam was performed according to the departmental dose-optimization program which includes automated exposure control, adjustment of the mA  and/or kV according to patient size and/or use of iterative reconstruction technique. COMPARISON:  CT head dated 02/02/2022 FINDINGS: CT HEAD FINDINGS Brain: No evidence of acute infarction, hemorrhage, hydrocephalus, extra-axial collection or mass lesion/mass effect. Global cortical atrophy. Mild subcortical white matter and periventricular small vessel ischemic changes. Vascular: No hyperdense vessel or unexpected calcification. Skull: Normal. Negative for fracture or focal lesion. Other: Small extracranial hematoma overlying the left parietal bone (series 3/image 17). CT MAXILLOFACIAL FINDINGS Osseous: No evidence of maxillofacial fracture. Nasal bones are intact. Mandible is intact. Bilateral mandibular condyles are well-seated in the TMJs. Orbits: Bilateral orbits, including the globes and retroconal soft tissues, are within normal limits. Sinuses: The visualized paranasal sinuses are essentially clear. The mastoid air cells are unopacified. Soft tissues: Soft tissue swelling/laceration overlying the nasal bridge, left greater than right (series 3/image 50). CT CERVICAL SPINE FINDINGS Alignment: Normal cervical lordosis. Skull base and vertebrae: No acute fracture. No primary bone lesion or focal pathologic process. Soft tissues and spinal canal: No prevertebral fluid or swelling. No visible canal hematoma. Disc levels: Mild degenerative changes of the mid cervical spine. Spinal canal is patent. Upper chest: Visualized lung apices are clear. Other: Visualized thyroid is unremarkable. IMPRESSION: Small extracranial hematoma overlying  the left parietal bone. No evidence of calvarial fracture. No evidence of acute intracranial abnormality. Atrophy with mild small vessel ischemic changes. Soft tissue swelling/laceration overlying the nasal bridge, left greater than right. No evidence of maxillofacial fracture. No traumatic injury to the cervical spine. Mild degenerative changes. Electronically Signed   By: Julian Hy M.D.   On: 02/11/2022 23:15   CT Cervical Spine Wo Contrast  Result Date: 02/11/2022 CLINICAL DATA:  Trauma EXAM: CT HEAD WITHOUT CONTRAST CT MAXILLOFACIAL WITHOUT CONTRAST CT CERVICAL SPINE WITHOUT CONTRAST TECHNIQUE: Multidetector CT imaging of the head, cervical spine, and maxillofacial structures were performed using the standard protocol without intravenous contrast. Multiplanar CT image reconstructions of the cervical spine and maxillofacial structures were also generated. RADIATION DOSE REDUCTION: This exam was performed according to the departmental dose-optimization program which includes automated exposure control, adjustment of the mA and/or kV according to patient size and/or use of iterative reconstruction technique. COMPARISON:  CT head dated 02/02/2022 FINDINGS: CT HEAD FINDINGS Brain: No evidence of acute infarction, hemorrhage, hydrocephalus, extra-axial collection or mass lesion/mass effect. Global cortical atrophy. Mild subcortical white matter and periventricular small vessel ischemic changes. Vascular: No hyperdense vessel or unexpected calcification. Skull: Normal. Negative for fracture or focal lesion. Other: Small extracranial hematoma overlying the left parietal bone (series 3/image 17). CT MAXILLOFACIAL FINDINGS Osseous: No evidence of maxillofacial fracture. Nasal bones are intact. Mandible is intact. Bilateral mandibular condyles are well-seated in the TMJs. Orbits: Bilateral orbits, including the globes and retroconal soft tissues, are within normal limits. Sinuses: The visualized paranasal sinuses are essentially clear. The mastoid air cells are unopacified. Soft tissues: Soft tissue swelling/laceration overlying the nasal bridge, left greater than right (series 3/image 50). CT CERVICAL SPINE FINDINGS Alignment: Normal cervical lordosis. Skull base and vertebrae: No acute fracture. No primary bone lesion or focal pathologic process. Soft tissues and spinal canal: No  prevertebral fluid or swelling. No visible canal hematoma. Disc levels: Mild degenerative changes of the mid cervical spine. Spinal canal is patent. Upper chest: Visualized lung apices are clear. Other: Visualized thyroid is unremarkable. IMPRESSION: Small extracranial hematoma overlying the left parietal bone. No evidence of calvarial fracture. No evidence of acute intracranial abnormality. Atrophy with mild small vessel ischemic changes. Soft tissue swelling/laceration overlying the nasal bridge, left greater than right. No evidence of maxillofacial fracture. No traumatic injury to the cervical spine. Mild degenerative changes. Electronically Signed   By: Julian Hy M.D.   On: 02/11/2022 23:15   CT Maxillofacial WO CM  Result Date: 02/11/2022 CLINICAL DATA:  Trauma EXAM: CT HEAD WITHOUT CONTRAST CT MAXILLOFACIAL WITHOUT CONTRAST CT CERVICAL SPINE WITHOUT CONTRAST TECHNIQUE: Multidetector CT imaging of the head, cervical spine, and maxillofacial structures were performed using the standard protocol without intravenous contrast. Multiplanar CT image reconstructions of the cervical spine and maxillofacial structures were also generated. RADIATION DOSE REDUCTION: This exam was performed according to the departmental dose-optimization program which includes automated exposure control, adjustment of the mA and/or kV according to patient size and/or use of iterative reconstruction technique. COMPARISON:  CT head dated 02/02/2022 FINDINGS: CT HEAD FINDINGS Brain: No evidence of acute infarction, hemorrhage, hydrocephalus, extra-axial collection or mass lesion/mass effect. Global cortical atrophy. Mild subcortical white matter and periventricular small vessel ischemic changes. Vascular: No hyperdense vessel or unexpected calcification. Skull: Normal. Negative for fracture or focal lesion. Other: Small extracranial hematoma overlying the left parietal bone (series 3/image 17). CT MAXILLOFACIAL FINDINGS Osseous:  No evidence of maxillofacial fracture. Nasal bones are intact. Mandible  is intact. Bilateral mandibular condyles are well-seated in the TMJs. Orbits: Bilateral orbits, including the globes and retroconal soft tissues, are within normal limits. Sinuses: The visualized paranasal sinuses are essentially clear. The mastoid air cells are unopacified. Soft tissues: Soft tissue swelling/laceration overlying the nasal bridge, left greater than right (series 3/image 50). CT CERVICAL SPINE FINDINGS Alignment: Normal cervical lordosis. Skull base and vertebrae: No acute fracture. No primary bone lesion or focal pathologic process. Soft tissues and spinal canal: No prevertebral fluid or swelling. No visible canal hematoma. Disc levels: Mild degenerative changes of the mid cervical spine. Spinal canal is patent. Upper chest: Visualized lung apices are clear. Other: Visualized thyroid is unremarkable. IMPRESSION: Small extracranial hematoma overlying the left parietal bone. No evidence of calvarial fracture. No evidence of acute intracranial abnormality. Atrophy with mild small vessel ischemic changes. Soft tissue swelling/laceration overlying the nasal bridge, left greater than right. No evidence of maxillofacial fracture. No traumatic injury to the cervical spine. Mild degenerative changes. Electronically Signed   By: Julian Hy M.D.   On: 02/11/2022 23:15   DG Chest 1 View  Result Date: 02/11/2022 CLINICAL DATA:  Fall with pain in right leg. EXAM: CHEST  1 VIEW COMPARISON:  Radiograph 02/02/2022 FINDINGS: Lower lung volumes from prior exam. Stable heart size and mediastinal contours allowing for differences in technique. No pneumothorax, large pleural effusion or focal airspace disease. No acute osseous findings. IMPRESSION: Low lung volumes without acute abnormality. Electronically Signed   By: Keith Rake M.D.   On: 02/11/2022 20:31   DG Pelvis 1-2 Views  Result Date: 02/11/2022 CLINICAL DATA:  Post  fall with right leg pain. EXAM: PELVIS - 1-2 VIEW COMPARISON:  None Available. FINDINGS: There is slight cortical offset of the right superior pubic ramus which may represent a nondisplaced fracture. No convincing inferior pubic ramus fracture. Femoral head is well seated in the acetabulum. Surgical hardware in the right proximal femur is intact. Pubic symphysis and sacroiliac joints are congruent. IMPRESSION: Findings suspicious for right superior pubic ramus fracture. Electronically Signed   By: Keith Rake M.D.   On: 02/11/2022 20:30   DG Femur Min 2 Views Right  Result Date: 02/11/2022 CLINICAL DATA:  Fall, pain in right leg. EXAM: RIGHT FEMUR 2 VIEWS COMPARISON:  Radiograph 10/12/2019 FINDINGS: Intramedullary nail with trans trochanteric and distal locking screw fixation. The previous intertrochanteric femur fracture has healed. The trans trochanteric screws again project into the lateral soft tissues by approximately 15 mm. There is no acute or periprosthetic fracture. The femoral head is seated in the acetabulum. Degenerative change of the hip and knee. IMPRESSION: 1. No acute fracture of the right femur. 2. Intramedullary nail with trans trochanteric and distal locking screw fixation, healed intertrochanteric fracture. Electronically Signed   By: Keith Rake M.D.   On: 02/11/2022 20:28   CT HEAD WO CONTRAST (5MM)  Result Date: 02/02/2022 CLINICAL DATA:  Fall.  Head trauma. EXAM: CT HEAD WITHOUT CONTRAST TECHNIQUE: Contiguous axial images were obtained from the base of the skull through the vertex without intravenous contrast. RADIATION DOSE REDUCTION: This exam was performed according to the departmental dose-optimization program which includes automated exposure control, adjustment of the mA and/or kV according to patient size and/or use of iterative reconstruction technique. COMPARISON:  None Available. FINDINGS: Brain: No evidence of acute infarction, hemorrhage, hydrocephalus,  extra-axial collection or mass lesion/mass effect. Mild ventricular sulcal enlargement reflecting age-appropriate volume loss. Vascular: No hyperdense vessel or unexpected calcification. Skull: Normal. Negative for fracture  or focal lesion. Sinuses/Orbits: Globes and orbits are unremarkable. Visualized sinuses are clear. Other: None. IMPRESSION: 1. No acute intracranial abnormalities. Electronically Signed   By: Lajean Manes M.D.   On: 02/02/2022 16:59   DG Chest Portable 1 View  Result Date: 02/02/2022 CLINICAL DATA:  Syncope.  Fatigue.  Fall today. EXAM: PORTABLE CHEST 1 VIEW COMPARISON:  11/19/2016. FINDINGS: Cardiac silhouette is normal in size. No mediastinal or hilar masses. Clear lungs.  No pleural effusion or pneumothorax. Skeletal structures are grossly intact. IMPRESSION: No active disease. Electronically Signed   By: Lajean Manes M.D.   On: 02/02/2022 16:48    Microbiology: Recent Results (from the past 240 hour(s))  Resp Panel by RT-PCR (Flu A&B, Covid)     Status: Abnormal   Collection Time: 02/11/22  7:30 PM   Specimen: Nasal Swab  Result Value Ref Range Status   SARS Coronavirus 2 by RT PCR POSITIVE (A) NEGATIVE Final    Comment: (NOTE) SARS-CoV-2 target nucleic acids are DETECTED.  The SARS-CoV-2 RNA is generally detectable in upper respiratory specimens during the acute phase of infection. Positive results are indicative of the presence of the identified virus, but do not rule out bacterial infection or co-infection with other pathogens not detected by the test. Clinical correlation with patient history and other diagnostic information is necessary to determine patient infection status. The expected result is Negative.  Fact Sheet for Patients: EntrepreneurPulse.com.au  Fact Sheet for Healthcare Providers: IncredibleEmployment.be  This test is not yet approved or cleared by the [place] FDA and  has been authorized for  detection and/or diagnosis of SARS-CoV-2 by FDA under an Emergency Use Authorization (EUA).  This EUA will remain in effect (meaning this test can be used) for the duration of  the COVID-19 declaration under Section 564(b)(1) of the A ct, 21 U.S.C. section 360bbb-3(b)(1), unless the authorization is terminated or revoked sooner.     Influenza A by PCR NEGATIVE NEGATIVE Final   Influenza B by PCR NEGATIVE NEGATIVE Final    Comment: (NOTE) The Xpert Xpress SARS-CoV-2/FLU/RSV plus assay is intended as an aid in the diagnosis of influenza from Nasopharyngeal swab specimens and should not be used as a sole basis for treatment. Nasal washings and aspirates are unacceptable for Xpert Xpress SARS-CoV-2/FLU/RSV testing.  Fact Sheet for Patients: EntrepreneurPulse.com.au  Fact Sheet for Healthcare Providers: IncredibleEmployment.be  This test is not yet approved or cleared by the [place] FDA and has been authorized for detection and/or diagnosis of SARS-CoV-2 by FDA under an Emergency Use Authorization (EUA). This EUA will remain in effect (meaning this test can be used) for the duration of the COVID-19 declaration under Section 564(b)(1) of the Act, 21 U.S.C. section 360bbb-3(b)(1), unless the authorization is terminated or revoked.  Performed at [facility], [place] [address], [place], [place] [address]      Labs: Basic Metabolic Panel: Recent Labs  Lab 02/11/22 1940 02/11/22 1951 02/11/22 2354 02/12/22 0500 02/13/22 0225 02/15/22 0216  NA 130* 130*  --  133* 136 133*  K 5.1 5.0  --  4.6 4.6 4.6  CL 95* 99  --  99 104 103  CO2 23  --   --  _0 GLUCOSE 172* 170*  --  176* 122* 119*  BUN 27* 28*  --  _1 CREATININE 1.62* 1.70* 1.54* 1.54* 1.39* 1.46*  CALCIUM 10.2  --   --  9.3 9.2 9.2  MG  --   --   --  1.9  --   --   PHOS  --   --   --  3.9  --   --     Liver Function Tests: Recent Labs  Lab 02/11/22 1940  02/12/22 0500  AST 37  --   ALT 21  --   ALKPHOS 79  --   BILITOT 1.0  --   PROT 7.5  --   ALBUMIN 3.7 2.8*    No results for input(s): "LIPASE", "AMYLASE" in the last 168 hours. No results for input(s): "AMMONIA" in the last 168 hours. CBC: Recent Labs  Lab 02/11/22 1940 02/11/22 1951 02/11/22 2354 02/12/22 0500 02/13/22 0225  WBC 8.3  --  6.2 5.6 5.4  NEUTROABS 6.4  --   --   --  2.8  HGB 11.1* 11.9* 9.9* 8.8* 8.4*  HCT 34.3* 35.0* 31.2* 26.5* 25.4*  MCV 86.8  --  88.4 85.8 85.2  PLT 248  --  218 199 159    Cardiac Enzymes: Recent Labs  Lab 02/11/22 1940  CKTOTAL 331    BNP: BNP (last 3 results) Recent Labs    02/02/22 1600  BNP 24.8     ProBNP (last 3 results) No results for input(s): "PROBNP" in the last 8760 hours.  CBG: Recent Labs  Lab 02/14/22 0806 02/14/22 1230 02/14/22 1659 02/14/22 2057 02/15/22 0809  GLUCAP 143* 158* 185* 141* 140*        Signed:  Nita Sells MD   [facility] 02/15/2022, 11:00 AM

## 2022-02-17 ENCOUNTER — Other Ambulatory Visit: Payer: Self-pay | Admitting: Internal Medicine

## 2022-02-18 DIAGNOSIS — E1169 Type 2 diabetes mellitus with other specified complication: Secondary | ICD-10-CM | POA: Diagnosis not present

## 2022-02-18 DIAGNOSIS — S32591D Other specified fracture of right pubis, subsequent encounter for fracture with routine healing: Secondary | ICD-10-CM | POA: Diagnosis not present

## 2022-02-18 DIAGNOSIS — E039 Hypothyroidism, unspecified: Secondary | ICD-10-CM | POA: Diagnosis not present

## 2022-02-18 DIAGNOSIS — I951 Orthostatic hypotension: Secondary | ICD-10-CM | POA: Diagnosis not present

## 2022-02-18 DIAGNOSIS — N183 Chronic kidney disease, stage 3 unspecified: Secondary | ICD-10-CM | POA: Diagnosis not present

## 2022-02-18 DIAGNOSIS — E1122 Type 2 diabetes mellitus with diabetic chronic kidney disease: Secondary | ICD-10-CM | POA: Diagnosis not present

## 2022-02-18 DIAGNOSIS — Z7984 Long term (current) use of oral hypoglycemic drugs: Secondary | ICD-10-CM | POA: Diagnosis not present

## 2022-02-18 DIAGNOSIS — E785 Hyperlipidemia, unspecified: Secondary | ICD-10-CM | POA: Diagnosis not present

## 2022-02-20 ENCOUNTER — Ambulatory Visit: Payer: Medicare Other | Admitting: Internal Medicine

## 2022-02-25 DIAGNOSIS — E039 Hypothyroidism, unspecified: Secondary | ICD-10-CM | POA: Diagnosis not present

## 2022-02-25 DIAGNOSIS — E1169 Type 2 diabetes mellitus with other specified complication: Secondary | ICD-10-CM | POA: Diagnosis not present

## 2022-02-25 DIAGNOSIS — E785 Hyperlipidemia, unspecified: Secondary | ICD-10-CM | POA: Diagnosis not present

## 2022-02-25 DIAGNOSIS — I951 Orthostatic hypotension: Secondary | ICD-10-CM | POA: Diagnosis not present

## 2022-02-25 DIAGNOSIS — N183 Chronic kidney disease, stage 3 unspecified: Secondary | ICD-10-CM | POA: Diagnosis not present

## 2022-02-25 DIAGNOSIS — E1122 Type 2 diabetes mellitus with diabetic chronic kidney disease: Secondary | ICD-10-CM | POA: Diagnosis not present

## 2022-02-25 DIAGNOSIS — S32591D Other specified fracture of right pubis, subsequent encounter for fracture with routine healing: Secondary | ICD-10-CM | POA: Diagnosis not present

## 2022-02-25 DIAGNOSIS — Z7984 Long term (current) use of oral hypoglycemic drugs: Secondary | ICD-10-CM | POA: Diagnosis not present

## 2022-03-03 ENCOUNTER — Ambulatory Visit: Payer: Medicare Other | Admitting: Internal Medicine

## 2022-03-03 ENCOUNTER — Encounter: Payer: Self-pay | Admitting: Internal Medicine

## 2022-03-05 ENCOUNTER — Telehealth: Payer: Self-pay | Admitting: Internal Medicine

## 2022-03-05 DIAGNOSIS — E785 Hyperlipidemia, unspecified: Secondary | ICD-10-CM | POA: Diagnosis not present

## 2022-03-05 DIAGNOSIS — M800AXD Age-related osteoporosis with current pathological fracture, other site, subsequent encounter for fracture with routine healing: Secondary | ICD-10-CM | POA: Diagnosis not present

## 2022-03-05 DIAGNOSIS — I5189 Other ill-defined heart diseases: Secondary | ICD-10-CM | POA: Diagnosis not present

## 2022-03-05 DIAGNOSIS — M8008XD Age-related osteoporosis with current pathological fracture, vertebra(e), subsequent encounter for fracture with routine healing: Secondary | ICD-10-CM | POA: Diagnosis not present

## 2022-03-05 DIAGNOSIS — N1831 Chronic kidney disease, stage 3a: Secondary | ICD-10-CM | POA: Diagnosis not present

## 2022-03-05 DIAGNOSIS — E1122 Type 2 diabetes mellitus with diabetic chronic kidney disease: Secondary | ICD-10-CM | POA: Diagnosis not present

## 2022-03-05 NOTE — Telephone Encounter (Signed)
Spoke w/ Richardson Landry- verbal orders given.

## 2022-03-05 NOTE — Telephone Encounter (Signed)
Caller/Agency: Denny Peon [facility] Callback Number: [phone] Requesting OT/PT/Skilled Nursing/Social Work/Speech Therapy: Physical Therapy  Frequency: 2wx2, 1 mthx 1

## 2022-03-07 ENCOUNTER — Telehealth: Payer: Self-pay | Admitting: Internal Medicine

## 2022-03-07 DIAGNOSIS — E785 Hyperlipidemia, unspecified: Secondary | ICD-10-CM | POA: Diagnosis not present

## 2022-03-07 DIAGNOSIS — E1122 Type 2 diabetes mellitus with diabetic chronic kidney disease: Secondary | ICD-10-CM | POA: Diagnosis not present

## 2022-03-07 DIAGNOSIS — I5189 Other ill-defined heart diseases: Secondary | ICD-10-CM | POA: Diagnosis not present

## 2022-03-07 DIAGNOSIS — M800AXD Age-related osteoporosis with current pathological fracture, other site, subsequent encounter for fracture with routine healing: Secondary | ICD-10-CM | POA: Diagnosis not present

## 2022-03-07 DIAGNOSIS — M8008XD Age-related osteoporosis with current pathological fracture, vertebra(e), subsequent encounter for fracture with routine healing: Secondary | ICD-10-CM | POA: Diagnosis not present

## 2022-03-07 DIAGNOSIS — N1831 Chronic kidney disease, stage 3a: Secondary | ICD-10-CM | POA: Diagnosis not present

## 2022-03-07 NOTE — Telephone Encounter (Signed)
Caller/Agency: [facility] [place])  Callback Number: [phone]  Requesting OT/PT/Skilled Nursing/Social Work/Speech Therapy: OT eval

## 2022-03-07 NOTE — Telephone Encounter (Signed)
LMOM w/ verbal orders.  

## 2022-03-10 ENCOUNTER — Encounter: Payer: Self-pay | Admitting: Internal Medicine

## 2022-03-10 ENCOUNTER — Ambulatory Visit (INDEPENDENT_AMBULATORY_CARE_PROVIDER_SITE_OTHER): Payer: Medicare Other | Admitting: Internal Medicine

## 2022-03-10 VITALS — BP 116/68 | HR 88 | Temp 97.7°F | Resp 18 | Ht 69.0 in | Wt 138.5 lb

## 2022-03-10 DIAGNOSIS — E785 Hyperlipidemia, unspecified: Secondary | ICD-10-CM | POA: Diagnosis not present

## 2022-03-10 DIAGNOSIS — E1121 Type 2 diabetes mellitus with diabetic nephropathy: Secondary | ICD-10-CM | POA: Diagnosis not present

## 2022-03-10 DIAGNOSIS — N1832 Chronic kidney disease, stage 3b: Secondary | ICD-10-CM | POA: Diagnosis not present

## 2022-03-10 DIAGNOSIS — D649 Anemia, unspecified: Secondary | ICD-10-CM

## 2022-03-10 DIAGNOSIS — E039 Hypothyroidism, unspecified: Secondary | ICD-10-CM

## 2022-03-10 DIAGNOSIS — C61 Malignant neoplasm of prostate: Secondary | ICD-10-CM

## 2022-03-10 LAB — CBC WITH DIFFERENTIAL/PLATELET
Basophils Absolute: 0.1 10*3/uL (ref 0.0–0.1)
Basophils Relative: 2.5 % (ref 0.0–3.0)
Eosinophils Absolute: 0.4 10*3/uL (ref 0.0–0.7)
Eosinophils Relative: 8 % — ABNORMAL HIGH (ref 0.0–5.0)
HCT: 32.4 % — ABNORMAL LOW (ref 39.0–52.0)
Hemoglobin: 10.3 g/dL — ABNORMAL LOW (ref 13.0–17.0)
Lymphocytes Relative: 23 % (ref 12.0–46.0)
Lymphs Abs: 1.2 10*3/uL (ref 0.7–4.0)
MCHC: 31.7 g/dL (ref 30.0–36.0)
MCV: 88.3 fl (ref 78.0–100.0)
Monocytes Absolute: 0.5 10*3/uL (ref 0.1–1.0)
Monocytes Relative: 8.5 % (ref 3.0–12.0)
Neutro Abs: 3.1 10*3/uL (ref 1.4–7.7)
Neutrophils Relative %: 58 % (ref 43.0–77.0)
Platelets: 276 10*3/uL (ref 150.0–400.0)
RBC: 3.67 Mil/uL — ABNORMAL LOW (ref 4.22–5.81)
RDW: 16.1 % — ABNORMAL HIGH (ref 11.5–15.5)
WBC: 5.3 10*3/uL (ref 4.0–10.5)

## 2022-03-10 LAB — BASIC METABOLIC PANEL
BUN: 24 mg/dL — ABNORMAL HIGH (ref 6–23)
CO2: 25 mEq/L (ref 19–32)
Calcium: 9.8 mg/dL (ref 8.4–10.5)
Chloride: 105 mEq/L (ref 96–112)
Creatinine, Ser: 1.45 mg/dL (ref 0.40–1.50)
GFR: 43.54 mL/min — ABNORMAL LOW (ref >60.00)
Glucose, Bld: 142 mg/dL — ABNORMAL HIGH (ref 70–99)
Potassium: 5 mEq/L (ref 3.5–5.1)
Sodium: 139 mEq/L (ref 135–145)

## 2022-03-10 LAB — IRON: Iron: 102 ug/dL (ref 42–165)

## 2022-03-10 LAB — TSH: TSH: 1.12 u[IU]/mL (ref 0.35–5.50)

## 2022-03-10 LAB — HEMOGLOBIN A1C: Hgb A1c MFr Bld: 6.2 % (ref 4.6–6.5)

## 2022-03-10 LAB — FERRITIN: Ferritin: 86.4 ng/mL (ref 22.0–322.0)

## 2022-03-10 NOTE — Assessment & Plan Note (Signed)
Syncope: Since the last visit, had another fall, had multiple fractures, was treated at the hospital, discharged to a SNF.  He is currently back home with his family.  No further falls but he still feels weak and mildly orthostatic when he stands up.  He uses a walker.   Orthostatic vital signs today: systolic BP dropped 10 points when he stood up, good hydration encouraged, consistent use of walker encourage DM: Currently on metformin at very low-dose due to CKD , also on Januvia.  Checking A1c today.  Will avoid strict control. CKD hyperkalemia: Currently on Lokelma every other day.  Rechecking labs today. Hyperlipidemia: On atorvastatin and Zetia, they raise a question of stopping them versus continue.  The patient does not like to take many medication, we agreed to stay on atorvastatin only. Hypothyroidism: On Synthroid, check a TSH Osteopenia: Recent fall with multiple fractures, patient declines to take Fosamax. Prostate cancer: Per urology. History of postherpetic neuralgia: was on gabapentin.  Currently no major issue. Subjective fever: ROS (-), observation Palliative care?  Explained the patient & son what palliative care is, patient states he is not ready Labs: BMP, CBC, iron, ferritin, A1c TSH RTC 2 months

## 2022-03-10 NOTE — Patient Instructions (Addendum)
Vaccines I recommend:  Covid booster RSV vaccine  Your blood pressure drops when you stand.  Be sure you  drink plenty of water, use your walker every time. When you stand up to do so slowly and holding on to something.  Check the  blood pressure regularly BP GOAL is between 110/65 and  135/85. If it is consistently higher or lower, let me know     GO TO THE LAB : Get the blood work     [place], [place] back for   a checkup in 2 months

## 2022-03-10 NOTE — Progress Notes (Signed)
Subjective:    Patient ID: Marco [name], male    DOB: 14-Oct-1933, 88 y.o.   MRN: [id number]  DOS:  03/10/2022 Type of visit - description: hospital f/u, here w/ his son  Admitted to hospital and discharged 02/15/2022: Had a fall, Dx with sacral insufficiency fracture  among other fractures. Fall was d/t orthostatic syncope in the context of prostate cancer with metastasis and recent COVID infection. Was discharged at a SNF and he is now back home.  He is here with his son. Since he went back home, no further syncope or fall. Still feeling slightly dizzy when he stands up. No chest pain or difficulty breathing  Did report subjective fever recently however when he checked his temperature it is normal. Denies any cough. No nausea, diarrhea or blood in the stools. No LUTS   Review of Systems See above   Past Medical History:  Diagnosis Date   Amaurosis fugax 2017   Diabetes mellitus    DJD (degenerative joint disease) 08/25/2013   Elevated PSA    Other and unspecified hyperlipidemia 04/25/2013   Prostate cancer [facility])     Past Surgical History:  Procedure Laterality Date   CATARACT EXTRACTION     Bilaterally   TYMPANIC MEMBRANE REPAIR     R    Current Outpatient Medications  Medication Instructions   Accu-Chek Softclix Lancets lancets CHECK BLOOD SUGAR ONCE  DAILY AS DIRECTED   acetaminophen (TYLENOL) 1,000 mg, Oral, Every 6 hours PRN   Alcohol Swabs PADS Use to clean finger prior to testing blood sugars   atorvastatin (LIPITOR) 40 mg, Oral, Daily at bedtime   Blood Glucose Calibration (ACCU-CHEK GUIDE CONTROL) LIQD Use as directed   Blood Glucose Monitoring Suppl (ACCU-CHEK GUIDE) w/Device KIT Check blood sugars once daily   Calcium Carb-Cholecalciferol (CALCIUM 500 + D) 500-125 MG-UNIT TABS 1 tablet, Oral, Daily   Cyanocobalamin (VITAMIN B-12 PO) 2,500 mcg, Daily   glucose blood (ACCU-CHEK GUIDE) test strip USE TO CHECK BLOOD SUGAR  ONCE DAILY   levothyroxine  (SYNTHROID) 100 mcg, Oral, Daily before breakfast   metFORMIN (GLUCOPHAGE) 500 mg, Oral, Daily, *take with a meal*   Multiple Vitamin (MULTI-VITAMIN DAILY PO) 1 tablet, Oral, Daily   nitroGLYCERIN (NITROSTAT) 0.4 mg, Sublingual, Every 5 min x3 PRN   polyethylene glycol (MIRALAX / GLYCOLAX) 17 g, Oral, Daily   senna-docusate (SENOKOT-S) 8.6-50 MG tablet 1 tablet, Oral, Daily at bedtime   sitaGLIPtin (JANUVIA) 50 mg, Oral, Daily   sodium zirconium cyclosilicate (LOKELMA) 10 g PACK packet Take as directed by physician   Xtandi 120 mg, Oral, Daily       Objective:   Physical Exam BP 116/68   Pulse 88   Temp 97.7 F (36.5 C) (Oral)   Resp 18   Ht _0  (1.753 m)   Wt 138 lb 8 oz (62.8 kg)   SpO2 98%   BMI 20.45 kg/m  General:   Well developed, NAD, BMI noted. HEENT:  Normocephalic . Face symmetric, atraumatic Lungs:  CTA B Normal respiratory effort, no intercostal retractions, no accessory muscle use. Heart: RRR,  no murmur.  Lower extremities: no pretibial edema bilaterally  Skin: Not pale. Not jaundice Neurologic:  alert & oriented X3.  Speech normal, gait: Assisted by a walker  psych--  Cognition and judgment appear intact.  Cooperative with normal attention span and concentration.  Behavior appropriate. No anxious or depressed appearing.      Assessment     Assessment  DM  w/ neuropathy (numbness, no pain) Dyslipidemia  Hypothyroidism, DX 09/2017 DJD CAD:  11-2016: CP, admitted, stress test indeterminate risk, declined cardiac catheterization Amaurosis fugax dx 10-2015, Dr Bing Plume, ECHO 11-2015 WNL, carotid u/s 02-2016 mild to moderate amount of atherosclerosis CKD: Creatinine 1.5 (May 2022) GFR 46. Osteoporosis: T score -1.5, hip fracture Osteopenia : DEXA 2015, Rx calcium and vitamin D.  DEXA 11-2019: Rx Fosamax ED Elevated PSA-prostate nodule:saw urology multiple times, last ~ 2013, declined a bx, declined further eval, aware of risks HOH  PLAN: Syncope: Since  the last visit, had another fall, had multiple fractures, was treated at the hospital, discharged to a SNF.  He is currently back home with his family.  No further falls but he still feels weak and mildly orthostatic when he stands up.  He uses a walker.   Orthostatic vital signs today: systolic BP dropped 10 points when he stood up, good hydration encouraged, consistent use of walker encourage DM: Currently on metformin at very low-dose due to CKD , also on Januvia.  Checking A1c today.  Will avoid strict control. CKD hyperkalemia: Currently on Lokelma every other day.  Rechecking labs today. Hyperlipidemia: On atorvastatin and Zetia, they raise a question of stopping them versus continue.  The patient does not like to take many medication, we agreed to stay on atorvastatin only. Hypothyroidism: On Synthroid, check a TSH Osteopenia: Recent fall with multiple fractures, patient declines to take Fosamax. Prostate cancer: Per urology. History of postherpetic neuralgia: was on gabapentin.  Currently no major issue. Subjective fever: ROS (-), observation Palliative care?  Explained the patient & son what palliative care is, patient states he is not ready Labs: BMP, CBC, iron, ferritin, A1c TSH RTC 2 months

## 2022-03-12 ENCOUNTER — Telehealth: Payer: Self-pay | Admitting: Internal Medicine

## 2022-03-12 DIAGNOSIS — H40053 Ocular hypertension, bilateral: Secondary | ICD-10-CM | POA: Diagnosis not present

## 2022-03-12 DIAGNOSIS — H524 Presbyopia: Secondary | ICD-10-CM | POA: Diagnosis not present

## 2022-03-12 DIAGNOSIS — H52203 Unspecified astigmatism, bilateral: Secondary | ICD-10-CM | POA: Diagnosis not present

## 2022-03-12 DIAGNOSIS — Z7984 Long term (current) use of oral hypoglycemic drugs: Secondary | ICD-10-CM | POA: Diagnosis not present

## 2022-03-12 DIAGNOSIS — H04123 Dry eye syndrome of bilateral lacrimal glands: Secondary | ICD-10-CM | POA: Diagnosis not present

## 2022-03-12 DIAGNOSIS — H43813 Vitreous degeneration, bilateral: Secondary | ICD-10-CM | POA: Diagnosis not present

## 2022-03-12 DIAGNOSIS — H5213 Myopia, bilateral: Secondary | ICD-10-CM | POA: Diagnosis not present

## 2022-03-12 DIAGNOSIS — H43393 Other vitreous opacities, bilateral: Secondary | ICD-10-CM | POA: Diagnosis not present

## 2022-03-12 DIAGNOSIS — H18413 Arcus senilis, bilateral: Secondary | ICD-10-CM | POA: Diagnosis not present

## 2022-03-12 DIAGNOSIS — H40003 Preglaucoma, unspecified, bilateral: Secondary | ICD-10-CM | POA: Diagnosis not present

## 2022-03-12 DIAGNOSIS — E119 Type 2 diabetes mellitus without complications: Secondary | ICD-10-CM | POA: Diagnosis not present

## 2022-03-12 NOTE — Telephone Encounter (Signed)
1 packet every other day, Rx sent

## 2022-03-12 NOTE — Telephone Encounter (Signed)
Pt's sig for Lokelma: MIX 1 PACKET IN AT LEAST 3 TABLESPOONS OF WATER STIR AND DRINK IMMEDIATELY EVERY OTHER DAY   Okay to refill?

## 2022-03-13 DIAGNOSIS — M800AXD Age-related osteoporosis with current pathological fracture, other site, subsequent encounter for fracture with routine healing: Secondary | ICD-10-CM | POA: Diagnosis not present

## 2022-03-13 DIAGNOSIS — M8008XD Age-related osteoporosis with current pathological fracture, vertebra(e), subsequent encounter for fracture with routine healing: Secondary | ICD-10-CM | POA: Diagnosis not present

## 2022-03-13 DIAGNOSIS — I5189 Other ill-defined heart diseases: Secondary | ICD-10-CM | POA: Diagnosis not present

## 2022-03-13 DIAGNOSIS — N1831 Chronic kidney disease, stage 3a: Secondary | ICD-10-CM | POA: Diagnosis not present

## 2022-03-13 DIAGNOSIS — E785 Hyperlipidemia, unspecified: Secondary | ICD-10-CM | POA: Diagnosis not present

## 2022-03-13 DIAGNOSIS — R531 Weakness: Secondary | ICD-10-CM | POA: Diagnosis not present

## 2022-03-13 DIAGNOSIS — E1122 Type 2 diabetes mellitus with diabetic chronic kidney disease: Secondary | ICD-10-CM | POA: Diagnosis not present

## 2022-03-14 DIAGNOSIS — M8008XD Age-related osteoporosis with current pathological fracture, vertebra(e), subsequent encounter for fracture with routine healing: Secondary | ICD-10-CM | POA: Diagnosis not present

## 2022-03-14 DIAGNOSIS — E785 Hyperlipidemia, unspecified: Secondary | ICD-10-CM | POA: Diagnosis not present

## 2022-03-14 DIAGNOSIS — I5189 Other ill-defined heart diseases: Secondary | ICD-10-CM | POA: Diagnosis not present

## 2022-03-14 DIAGNOSIS — M800AXD Age-related osteoporosis with current pathological fracture, other site, subsequent encounter for fracture with routine healing: Secondary | ICD-10-CM | POA: Diagnosis not present

## 2022-03-14 DIAGNOSIS — N1831 Chronic kidney disease, stage 3a: Secondary | ICD-10-CM | POA: Diagnosis not present

## 2022-03-14 DIAGNOSIS — E1122 Type 2 diabetes mellitus with diabetic chronic kidney disease: Secondary | ICD-10-CM | POA: Diagnosis not present

## 2022-03-18 ENCOUNTER — Telehealth: Payer: Self-pay | Admitting: Internal Medicine

## 2022-03-18 DIAGNOSIS — E1122 Type 2 diabetes mellitus with diabetic chronic kidney disease: Secondary | ICD-10-CM | POA: Diagnosis not present

## 2022-03-18 DIAGNOSIS — M8008XD Age-related osteoporosis with current pathological fracture, vertebra(e), subsequent encounter for fracture with routine healing: Secondary | ICD-10-CM | POA: Diagnosis not present

## 2022-03-18 DIAGNOSIS — N1831 Chronic kidney disease, stage 3a: Secondary | ICD-10-CM | POA: Diagnosis not present

## 2022-03-18 DIAGNOSIS — M800AXD Age-related osteoporosis with current pathological fracture, other site, subsequent encounter for fracture with routine healing: Secondary | ICD-10-CM | POA: Diagnosis not present

## 2022-03-18 DIAGNOSIS — E785 Hyperlipidemia, unspecified: Secondary | ICD-10-CM | POA: Diagnosis not present

## 2022-03-18 DIAGNOSIS — I5189 Other ill-defined heart diseases: Secondary | ICD-10-CM | POA: Diagnosis not present

## 2022-03-18 LAB — HM DIABETES EYE EXAM

## 2022-03-18 MED ORDER — NITROGLYCERIN 0.4 MG SL SUBL: 0.4000 mg | SUBLINGUAL_TABLET | SUBLINGUAL | 3 refills | Status: DC | PRN

## 2022-03-18 NOTE — Telephone Encounter (Signed)
Noted  

## 2022-03-18 NOTE — Telephone Encounter (Signed)
Rx sent 

## 2022-03-18 NOTE — Telephone Encounter (Signed)
Medication: nitroGLYCERIN (NITROSTAT) 0.4 MG SL tablet   Has the patient contacted their pharmacy? No.   Preferred Pharmacy (with phone number or street name):  [id number] - [place], [place] - [address] AT [address] [place], [place] [address] Phone: [phone]  Fax: [phone]

## 2022-03-18 NOTE — Telephone Encounter (Signed)
[facility] called stating that pt is being discharged from services on 11.16.23 due to pt leaving out of town. Attempted to get information on caller but was unable to due to bad connection. Recorded what information was available and ended call.

## 2022-03-19 ENCOUNTER — Telehealth: Payer: Self-pay

## 2022-03-19 DIAGNOSIS — Z9181 History of falling: Secondary | ICD-10-CM

## 2022-03-19 DIAGNOSIS — R001 Bradycardia, unspecified: Secondary | ICD-10-CM

## 2022-03-19 DIAGNOSIS — M8008XD Age-related osteoporosis with current pathological fracture, vertebra(e), subsequent encounter for fracture with routine healing: Secondary | ICD-10-CM | POA: Diagnosis not present

## 2022-03-19 DIAGNOSIS — I951 Orthostatic hypotension: Secondary | ICD-10-CM | POA: Diagnosis not present

## 2022-03-19 DIAGNOSIS — I672 Cerebral atherosclerosis: Secondary | ICD-10-CM

## 2022-03-19 DIAGNOSIS — N1831 Chronic kidney disease, stage 3a: Secondary | ICD-10-CM | POA: Diagnosis not present

## 2022-03-19 DIAGNOSIS — B0229 Other postherpetic nervous system involvement: Secondary | ICD-10-CM | POA: Diagnosis not present

## 2022-03-19 DIAGNOSIS — E039 Hypothyroidism, unspecified: Secondary | ICD-10-CM

## 2022-03-19 DIAGNOSIS — C61 Malignant neoplasm of prostate: Secondary | ICD-10-CM

## 2022-03-19 DIAGNOSIS — I5189 Other ill-defined heart diseases: Secondary | ICD-10-CM | POA: Diagnosis not present

## 2022-03-19 DIAGNOSIS — C7951 Secondary malignant neoplasm of bone: Secondary | ICD-10-CM | POA: Diagnosis not present

## 2022-03-19 DIAGNOSIS — I700 Atherosclerosis of aorta: Secondary | ICD-10-CM

## 2022-03-19 DIAGNOSIS — Z8616 Personal history of COVID-19: Secondary | ICD-10-CM

## 2022-03-19 DIAGNOSIS — M1711 Unilateral primary osteoarthritis, right knee: Secondary | ICD-10-CM | POA: Diagnosis not present

## 2022-03-19 DIAGNOSIS — I2510 Atherosclerotic heart disease of native coronary artery without angina pectoris: Secondary | ICD-10-CM | POA: Diagnosis not present

## 2022-03-19 DIAGNOSIS — Z7984 Long term (current) use of oral hypoglycemic drugs: Secondary | ICD-10-CM

## 2022-03-19 DIAGNOSIS — M800AXD Age-related osteoporosis with current pathological fracture, other site, subsequent encounter for fracture with routine healing: Secondary | ICD-10-CM | POA: Diagnosis not present

## 2022-03-19 DIAGNOSIS — E785 Hyperlipidemia, unspecified: Secondary | ICD-10-CM | POA: Diagnosis not present

## 2022-03-19 DIAGNOSIS — G319 Degenerative disease of nervous system, unspecified: Secondary | ICD-10-CM

## 2022-03-19 DIAGNOSIS — E1122 Type 2 diabetes mellitus with diabetic chronic kidney disease: Secondary | ICD-10-CM | POA: Diagnosis not present

## 2022-03-19 NOTE — Telephone Encounter (Signed)
Physician orders signed and faxed back to [facility] at [phone]. Form sent for scanning.

## 2022-03-19 NOTE — Telephone Encounter (Signed)
Plan of care signed and faxed back to [facility] at [phone]. Form sent for scanning.

## 2022-03-20 DIAGNOSIS — N1831 Chronic kidney disease, stage 3a: Secondary | ICD-10-CM | POA: Diagnosis not present

## 2022-03-20 DIAGNOSIS — M800AXD Age-related osteoporosis with current pathological fracture, other site, subsequent encounter for fracture with routine healing: Secondary | ICD-10-CM | POA: Diagnosis not present

## 2022-03-20 DIAGNOSIS — E785 Hyperlipidemia, unspecified: Secondary | ICD-10-CM | POA: Diagnosis not present

## 2022-03-20 DIAGNOSIS — M8008XD Age-related osteoporosis with current pathological fracture, vertebra(e), subsequent encounter for fracture with routine healing: Secondary | ICD-10-CM | POA: Diagnosis not present

## 2022-03-20 DIAGNOSIS — I5189 Other ill-defined heart diseases: Secondary | ICD-10-CM | POA: Diagnosis not present

## 2022-03-20 DIAGNOSIS — E1122 Type 2 diabetes mellitus with diabetic chronic kidney disease: Secondary | ICD-10-CM | POA: Diagnosis not present

## 2022-04-21 ENCOUNTER — Ambulatory Visit: Payer: Medicare Other | Admitting: Internal Medicine

## 2022-04-25 ENCOUNTER — Ambulatory Visit: Payer: Medicare Other | Admitting: Pharmacist

## 2022-04-25 DIAGNOSIS — N1832 Chronic kidney disease, stage 3b: Secondary | ICD-10-CM

## 2022-04-25 DIAGNOSIS — Z79899 Other long term (current) drug therapy: Secondary | ICD-10-CM

## 2022-04-25 DIAGNOSIS — E1121 Type 2 diabetes mellitus with diabetic nephropathy: Secondary | ICD-10-CM

## 2022-04-25 NOTE — Patient Instructions (Signed)
Mr. Toft It was a pleasure speaking with you today.  Below is a summary of your health goals and summary of our recent visit.   Falls / knee pain:  Continue to use walker all the time when walking.  Make sure to drink enough water to prevent dehydration.  Have a glass with each meal and try to sip water during daytime.  For pain you can take acetaminophen '1000mg'$  up to 3 times a day if needed.  Avoid NSAIDS - Advil, Motrin, ibuprofen, Aleve. These medications or their generic versions can affect your kidney function. Diabetes:   Continue to check blood glucose 3 to 4 days per week.  Home blood glucose  goals  Fasting blood glucose goal (before meals) = 80 to 130 Blood glucose goal after a meal = less than 180   Continue to take Januvia '50mg'$  daily and metformin ER '500mg'$  daily   Medication management:  requested needed refills from [facility] at patient request - metformin, Januvia, levothyroxine and atorvastatin. [facility] will reach out to you regarding need for Xtandi.   As always if you have any questions or concerns especially regarding medications, please feel free to contact me either at the phone number below or with a MyChart message.   Keep up the good work!  [name], PharmD Clinical Pharmacist [place] [phone] (direct line)  [phone] (main office number)   Patient verbalizes understanding of instructions and care plan provided today and agrees to view in [place]. Active MyChart status and patient understanding of how to access instructions and care plan via MyChart confirmed with patient.

## 2022-04-25 NOTE — Progress Notes (Signed)
Pharmacy Note  04/25/2022 Name: Marco [name] MRN: [id number] DOB: [birth year]  Subjective: Marco [name] is a 88 y.o. year old male who is a primary care patient of Colon Branch, MD. Clinical Pharmacist Practitioner referral was placed to assist with medication management.    Engaged with patient by telephone for follow up visit today.  CKD-3: eGFR / renal function continues to improve. Last eGFR was improved at 43.54 mL/min. Medications are reviewed for renal dosing adjustment. Falls: Patient had 2 falls in the last 3 months that required ED visit and hospitalization. On both occasions patient seems to have syncopal event. He states he has not had a fall in the last month. He is using walker for ambulation. Patient denies dizziness. He does have knee pain in both knees. His medication list has acetaminophen listed as needed for pain. Patient asks about taking Advil for pain.  He had physical therapy after he returned home from rehab facility but PT stopped around mid November 2023 when patient left country for [place] for [employer] wedding.  Type 2 DM:  Patient is checking blood glucose most days. Reports blood glucose readings fasting usually 110 to 120's.  Prostate cancer: followed by urologist. Still taking Xtandi 139m daily for prostate cancer.  Medication management: reviewed refill history and med list with patient. Updated med list.  SDOH (Social Determinants of Health) assessments and interventions performed:  SDOH Interventions    Flowsheet Row [facility] [place] [facility] from [place]/20[place] from 10/24/2020 in [place] MFairchildInterventions      Food Insecurity Interventions --  Intervention Not Indicated -- --  Housing Interventions -- Intervention Not Indicated -- --  Transportation Interventions -- Intervention Not Indicated -- --  Financial Strain Interventions Intervention Not Indicated -- Intervention Not Indicated Intervention Not Indicated  Physical Activity Interventions -- -- -- Patient Refused  Stress Interventions -- Intervention Not Indicated -- --  Social Connections Interventions -- Intervention Not Indicated -- --        Objective: Review of patient status, including review of consultants reports, laboratory and other test data, was performed as part of comprehensive.  Lab Results  Component Value Date   CREATININE 1.45 03/10/2022   CREATININE 1.46 (H) 02/15/2022   CREATININE 1.39 (H) 02/13/2022    Lab Results  Component Value Date   HGBA1C 6.2 03/10/2022       Component Value Date/Time   CHOL 132 08/13/2021 0956   TRIG 91.0 08/13/2021 0956   HDL 61.00 08/13/2021 0956   CHOLHDL 2 08/13/2021 0956   VLDL 18.2 08/13/2021 0956   LDLCALC 53 08/13/2021 0956     Clinical ASCVD: Yes  The ASCVD Risk score (Arnett DK, et al., 2019) failed to calculate for the following reasons:   The 2019 ASCVD risk score is only valid for ages 49to 718   BP Readings from Last 3 Encounters:  03/10/22 116/68  02/15/22 105/71  02/04/22 132/70     Allergies  Allergen Reactions   Beef-Derived Products     vegetarian   Chicken Allergy    Fish Allergy    Pork-Derived Products     Medications Reviewed Today     Reviewed by ECherre Robins RPH-CPP (Pharmacist) on  04/25/22 at 1033  Med List Status: <None>   Medication Order Taking? Sig Documenting Provider Last Dose Status Informant  Accu-Chek Softclix Lancets lancets [id number] Yes CHECK BLOOD SUGAR ONCE  DAILY AS DIRECTED Colon Branch, MD Taking Active Child  acetaminophen (TYLENOL) 500 MG tablet [id number] Yes Take 2 tablets (1,000 mg total) by mouth every 6 (six) hours as needed. Nita Sells, MD Taking Active   Alcohol Swabs PADS [id number] Yes Use to clean finger prior to testing blood sugars Colon Branch, MD Taking Active Child  atorvastatin (LIPITOR) 40 MG tablet [id number] Yes Take 1 tablet (40 mg total) by mouth at bedtime. Colon Branch, MD Taking Active Child           Med Note Antony Contras, Sandre Kitty   Fri Apr 25, 2022 10:16 AM)    Blood Glucose Calibration (ACCU-CHEK GUIDE CONTROL) Yehuda Budd [id number] Yes Use as directed Colon Branch, MD Taking Active Child  Blood Glucose Monitoring Suppl (ACCU-CHEK GUIDE) w/Device Drucie Opitz [id number] Yes Check blood sugars once daily Colon Branch, MD Taking Active Child  Calcium Carb-Cholecalciferol (CALCIUM 500 + D) 500-125 MG-UNIT TABS [id number] Yes Take 1 tablet by mouth daily. [provider] Taking Active Child  Cyanocobalamin (VITAMIN B-12 PO) [id number] Yes Take 2,500 mcg by mouth daily. [provider] Taking Active Child  glucose blood (ACCU-CHEK GUIDE) test strip [id number] Yes USE TO CHECK BLOOD SUGAR  ONCE DAILY Colon Branch, MD Taking Active Child  levothyroxine (SYNTHROID) 100 MCG tablet [id number] Yes Take 1 tablet (100 mcg total) by mouth daily before breakfast. Colon Branch, MD Taking Active Child  metFORMIN (GLUCOPHAGE) 500 MG tablet [id number] Yes Take 1 tablet (500 mg total) by mouth daily. *take with a mealLarose Kells, Alda Berthold, MD Taking Active Child  Multiple Vitamin (MULTI-VITAMIN DAILY PO) [id number] Yes Take 1 tablet by mouth daily. [provider] Taking Active Child  nitroGLYCERIN (NITROSTAT) 0.4 MG SL tablet [id number] No Place 1 tablet (0.4 mg total) under the tongue every 5 (five) minutes x 3 doses as needed for chest pain.  Patient not taking: Reported on 04/25/2022   Colon Branch, MD Not Taking Active   senna-docusate (SENOKOT-S) 8.6-50 MG tablet [id number] No Take 1 tablet by mouth at bedtime.  Patient not taking: Reported on 03/10/2022   Nita Sells, MD Not Taking Active            Med Note Larose Kells, Augustin Schooling Mar 10, 2022  9:43 AM) PRN  sitaGLIPtin (JANUVIA) 50 MG tablet [id number] Yes Take 1 tablet (50 mg total) by mouth daily. Colon Branch, MD Taking Active Child  sodium zirconium cyclosilicate (LOKELMA) 10 g PACK packet [id number] Yes 1 pack every other day Colon Branch, MD Taking Active   XTANDI 40 MG tablet [id number] Yes Take 120 mg by mouth daily. [provider] Taking Active Child            Patient Active Problem List   Diagnosis Date Noted   Fall 02/11/2022   Prostate cancer ([place]) 05/29/2021   CKD (chronic kidney disease) stage 3, GFR 30-59 ml/min ([place]) 02/11/2021   CAD (coronary artery disease) 03/06/2020   Hypothyroidism 12/02/2017   Chronic pain of right knee 10/13/2017   Chest pain 11/19/2016   Amaurosis fugax 12/12/2015   PCP NOTES >>> 03/08/2015   Osteoporosis 09/15/2014   Primary osteoarthritis of both knees 03/16/2014   Hearing difficulty 10/20/2013   DJD (degenerative joint disease) 08/25/2013  Neck mass 04/25/2013   Hyperlipidemia 04/25/2013   Erectile dysfunction 12/26/2011   Annual physical exam 08/22/2011   Prostate nodule 08/22/2011   Diabetes mellitus ([place]) 05/07/2011     Medication Assistance:  None required.  Patient affirms current coverage meets needs.   Assessment / Plan: CKD-3:  Medications reviewed for renal dosing adjustment. Falls / knee pain:  Continue to use walker for ambulation Advised adequate water intake to prevent dehydration.  Advised he can take acetaminophen 1010m up to 3 times a day if needed for pain.  Avoid NSAIDS - Advil, Motrin, ibuprofen, Aleve due to low kidney function.  Patient had 2 falls in the last 3 months that required ED visit and hospitalization.  Type 2 DM:  Continue to check blood glucose 3 to 4 days per week.  Continue to take Januvia 551mdaily and metformin ER 50039maily (doses adjusted for renal function) Medication management:  Reviewed and updated medication list Reviewed refill history  and adherence requested needed refills from [facility] at patient request - metformin, Januvia, levothyroxine and atorvastatin. [facility] will reach out to patient regarding need for Xtandi.   Follow Up:  Telephone follow up appointment with care management team member scheduled for:  2 to 3 months   TamCherre RobinsharmD Clinical Pharmacist [place] [phone]

## 2022-05-14 ENCOUNTER — Encounter: Payer: Self-pay | Admitting: Internal Medicine

## 2022-05-15 ENCOUNTER — Ambulatory Visit (INDEPENDENT_AMBULATORY_CARE_PROVIDER_SITE_OTHER): Payer: Medicare Other | Admitting: Internal Medicine

## 2022-05-15 ENCOUNTER — Encounter: Payer: Self-pay | Admitting: Internal Medicine

## 2022-05-15 VITALS — BP 120/72 | HR 78 | Temp 97.9°F | Resp 18 | Ht 69.0 in | Wt 137.0 lb

## 2022-05-15 DIAGNOSIS — E039 Hypothyroidism, unspecified: Secondary | ICD-10-CM | POA: Diagnosis not present

## 2022-05-15 DIAGNOSIS — N1832 Chronic kidney disease, stage 3b: Secondary | ICD-10-CM

## 2022-05-15 DIAGNOSIS — M25561 Pain in right knee: Secondary | ICD-10-CM | POA: Diagnosis not present

## 2022-05-15 NOTE — Patient Instructions (Addendum)
Vaccines I recommend:  Covid booster RSV vaccine  Check the  blood pressure regularly BP GOAL is between 110/65 and  135/85. If it is consistently higher or lower, let me know      GO TO THE FRONT DESK, PLEASE SCHEDULE YOUR APPOINTMENTS Come back for   a check up in 3 months

## 2022-05-15 NOTE — Progress Notes (Signed)
Subjective:    Patient ID: Marco [name], male    DOB: 24-Jul-1933, 89 y.o.   MRN: [id number]  DOS:  05/15/2022 Type of visit - description: Follow-up, here with his son  Routine follow-up. Since the last office visit is doing well. Denies any further falls. His main complaint today is right knee pain  Wt Readings from Last 3 Encounters:  05/15/22 137 lb (62.1 kg)  03/10/22 138 lb 8 oz (62.8 kg)  02/04/22 140 lb (63.5 kg)    Review of Systems See above   Past Medical History:  Diagnosis Date   Amaurosis fugax 2017   Diabetes mellitus    DJD (degenerative joint disease) 08/25/2013   Elevated PSA    Other and unspecified hyperlipidemia 04/25/2013   Prostate cancer [facility])     Past Surgical History:  Procedure Laterality Date   CATARACT EXTRACTION     Bilaterally   TYMPANIC MEMBRANE REPAIR     R    Current Outpatient Medications  Medication Instructions   Accu-Chek Softclix Lancets lancets CHECK BLOOD SUGAR ONCE  DAILY AS DIRECTED   acetaminophen (TYLENOL) 1,000 mg, Oral, Every 6 hours PRN   Alcohol Swabs PADS Use to clean finger prior to testing blood sugars   atorvastatin (LIPITOR) 40 mg, Oral, Daily at bedtime   Blood Glucose Calibration (ACCU-CHEK GUIDE CONTROL) LIQD Use as directed   Blood Glucose Monitoring Suppl (ACCU-CHEK GUIDE) w/Device KIT Check blood sugars once daily   Calcium Carb-Cholecalciferol (CALCIUM 500 + D) 500-125 MG-UNIT TABS 1 tablet, Oral, Daily   Cyanocobalamin (VITAMIN B-12 PO) 2,500 mcg, Oral, Daily   glucose blood (ACCU-CHEK GUIDE) test strip USE TO CHECK BLOOD SUGAR  ONCE DAILY   levothyroxine (SYNTHROID) 100 mcg, Oral, Daily before breakfast   metFORMIN (GLUCOPHAGE) 500 mg, Oral, Daily, *take with a meal*   Multiple Vitamin (MULTI-VITAMIN DAILY PO) 1 tablet, Oral, Daily   nitroGLYCERIN (NITROSTAT) 0.4 mg, Sublingual, Every 5 min x3 PRN   senna-docusate (SENOKOT-S) 8.6-50 MG tablet 1 tablet, Oral, Daily at bedtime   sitaGLIPtin  (JANUVIA) 50 mg, Oral, Daily   sodium zirconium cyclosilicate (LOKELMA) 10 g PACK packet 1 pack every other day   Xtandi 120 mg, Oral, Daily       Objective:   Physical Exam BP 120/72   Pulse 78   Temp 97.9 F (36.6 C) (Oral)   Resp 18   Ht '5\' 9"'$  (1.753 m)   Wt 137 lb (62.1 kg)   SpO2 97%   BMI 20.23 kg/m  General:   Well developed, NAD, BMI noted. HEENT:  Normocephalic . Face symmetric, atraumatic Lungs:  CTA B Normal respiratory effort, no intercostal retractions, no accessory muscle use. Heart: RRR,  no murmur.  Lower extremities: Right knee with few changes consistent with DJD, no effusion, no warmness or redness.  Range of motion seems normal Skin: Not pale. Not jaundice Neurologic:  alert & oriented X3.  Speech normal, gait appropriate for age, uses a walker Psych--  Cognition and judgment appear intact.  Cooperative with normal attention span and concentration.  Behavior appropriate. No anxious or depressed appearing.      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 Osteoporosis: T score -1.5, hip  fracture Osteopenia : DEXA 2015, Rx calcium and vitamin D.  DEXA 11-2019: Rx Fosamax ED Elevated PSA-prostate nodule:saw urology multiple times, last ~ 2013, declined a bx, declined further eval, aware of risks HOH  PLAN: Follow-up from previous visit Syncope: No further episodes, no falls. DM: Last A1c satisfactory at 7.2, continue low-dose metformin and Januvia. CKD, hyperkalemia: Last BMP stable. Hypothyroidism: Last TSH stable, no changes. DJD: Complain of right knee pain, no effusion on exam, would like further advice, refer to Ortho. RTC routine  checkup 3 months

## 2022-05-16 NOTE — Assessment & Plan Note (Signed)
Follow-up from previous visit Syncope: No further episodes, no falls. DM: Last A1c satisfactory at 7.2, continue low-dose metformin and Januvia. CKD, hyperkalemia: Last BMP stable. Hypothyroidism: Last TSH stable, no changes. DJD: Complain of right knee pain, no effusion on exam, would like further advice, refer to Ortho. RTC routine checkup 3 months

## 2022-05-31 DIAGNOSIS — M170 Bilateral primary osteoarthritis of knee: Secondary | ICD-10-CM | POA: Diagnosis not present

## 2022-05-31 DIAGNOSIS — M810 Age-related osteoporosis without current pathological fracture: Secondary | ICD-10-CM | POA: Diagnosis not present

## 2022-05-31 DIAGNOSIS — I5032 Chronic diastolic (congestive) heart failure: Secondary | ICD-10-CM | POA: Diagnosis not present

## 2022-05-31 DIAGNOSIS — S3210XD Unspecified fracture of sacrum, subsequent encounter for fracture with routine healing: Secondary | ICD-10-CM | POA: Diagnosis not present

## 2022-06-02 ENCOUNTER — Telehealth: Payer: Self-pay | Admitting: Internal Medicine

## 2022-06-02 NOTE — Telephone Encounter (Signed)
Do you know how to order stair lift?

## 2022-06-02 NOTE — Telephone Encounter (Signed)
Not specifically but we can write a generic letter stating that in my opinion he will benefit from a stair lift

## 2022-06-02 NOTE — Telephone Encounter (Signed)
Patient called with insurance company on the line stating he has been having a hard time going up and down the stairs in his house. He would like to order the chairs that take you up and down the stairs electronically. Please advise.

## 2022-06-03 NOTE — Telephone Encounter (Signed)
Mychart message sent to Pt.   [employer]  [address] [place], [place] [address] [phone]  [place] that [employer] stair lifts.

## 2022-06-03 NOTE — Telephone Encounter (Signed)
Last read by Dimas Millin at  3:37 PM on 06/03/2022

## 2022-06-04 ENCOUNTER — Ambulatory Visit: Payer: Medicare Other | Admitting: Orthopedic Surgery

## 2022-06-13 ENCOUNTER — Ambulatory Visit (INDEPENDENT_AMBULATORY_CARE_PROVIDER_SITE_OTHER): Payer: 59 | Admitting: Surgical

## 2022-06-13 DIAGNOSIS — M170 Bilateral primary osteoarthritis of knee: Secondary | ICD-10-CM

## 2022-06-13 DIAGNOSIS — M1711 Unilateral primary osteoarthritis, right knee: Secondary | ICD-10-CM

## 2022-06-13 DIAGNOSIS — M1712 Unilateral primary osteoarthritis, left knee: Secondary | ICD-10-CM | POA: Diagnosis not present

## 2022-06-14 ENCOUNTER — Encounter: Payer: Self-pay | Admitting: Orthopedic Surgery

## 2022-06-14 MED ORDER — METHYLPREDNISOLONE ACETATE 40 MG/ML IJ SUSP
40.0000 mg | INTRAMUSCULAR | Status: AC | PRN
  Administered 2022-06-13: 40 mg via INTRA_ARTICULAR

## 2022-06-14 MED ORDER — BUPIVACAINE HCL 0.25 % IJ SOLN
4.0000 mL | INTRAMUSCULAR | Status: AC | PRN
  Administered 2022-06-13: 4 mL via INTRA_ARTICULAR

## 2022-06-14 MED ORDER — LIDOCAINE HCL 1 % IJ SOLN
5.0000 mL | INTRAMUSCULAR | Status: AC | PRN
  Administered 2022-06-13: 5 mL

## 2022-06-14 NOTE — Progress Notes (Signed)
Office Visit Note   Patient: Marco [name]           Date of Birth: 1934-04-04           MRN: [id number] Visit Date: 06/13/2022 Requested by: Colon Branch, [address] [place],  [place] [address] PCP: Colon Branch, MD  Subjective: Chief Complaint  Patient presents with   Right Knee - Pain    HPI: Marco [name] is a 89 y.o. male who presents to the office reporting bilateral knee pain.  Has history of bilateral knee osteoarthritis.  He has had prior cortisone and gel injections with good relief for several months.  Localizes most of his pain to the medial aspect of either knee.  Pain does not wake him up at night.  He ambulates with a walker.  No history of prior knee surgery but does have history of intramedullary nail placement to the right femur for hip fracture.  No groin pain.  Does have history of diabetes but he states that this is fairly well-controlled with A1c around 6.8.  Recently had a friend who had knee replacement and he is somewhat interested in pursuing this for himself.  No new falls or injuries..                ROS: All systems reviewed are negative as they relate to the chief complaint within the history of present illness.  Patient denies fevers or chills.  Assessment & Plan: Visit Diagnoses:  1. Bilateral primary osteoarthritis of knee     Plan: Patient is a 89 year old male who presents for evaluation of bilateral knee pain.  Has history of bilateral knee osteoarthritis.  He would like to try cortisone injections today and we will preapproved him for gel injections to try in 3 to 4 weeks once they are approved.  Usually these injections give about 2 to 3 months of relief according to his son who accompanies him in the visit today.  He is interested in knee replacement although he is not the optimal candidate at 89 years old with some medical comorbidities.  Will see how these injections do for him and then if he still has severe discomfort, we  can discuss surgery in the future.  About 39 cc was aspirated from the right knee with 34 cc aspirated from the left knee prior to cortisone injection.  He tolerated procedure well.  Follow-up with the office after gel injection approval.  This patient is diagnosed with osteoarthritis of the knee(s).    Radiographs show evidence of joint space narrowing, osteophytes, subchondral sclerosis and/or subchondral cysts.  This patient has knee pain which interferes with functional and activities of daily living.    This patient has experienced inadequate response, adverse effects and/or intolerance with conservative treatments such as acetaminophen, NSAIDS, topical creams, physical therapy or regular exercise, knee bracing and/or weight loss.   This patient has experienced inadequate response or has a contraindication to intra articular steroid injections for at least 3 months.   This patient is not scheduled to have a total knee replacement within 6 months of starting treatment with viscosupplementation.   Follow-Up Instructions: No follow-ups on file.   Orders:  No orders of the defined types were placed in this encounter.  No orders of the defined types were placed in this encounter.     Procedures: Large Joint Inj: bilateral knee on 06/13/2022 8:00 AM Indications: diagnostic evaluation, joint swelling and pain Details:  18 G 1.5 in needle, superolateral approach  Arthrogram: No  Medications (Right): 5 mL lidocaine 1 %; 4 mL bupivacaine 0.25 %; 40 mg methylPREDNISolone acetate 40 MG/ML Aspirate (Right): 39 mL blood-tinged Medications (Left): 5 mL lidocaine 1 %; 4 mL bupivacaine 0.25 %; 40 mg methylPREDNISolone acetate 40 MG/ML Aspirate (Left): 34 mL Outcome: tolerated well, no immediate complications Procedure, treatment alternatives, risks and benefits explained, specific risks discussed. Consent was given by the patient. Immediately prior to procedure a time out was called to verify the  correct patient, procedure, equipment, support staff and site/side marked as required. Patient was prepped and draped in the usual sterile fashion.       Clinical Data: No additional findings.  Objective: Vital Signs: There were no vitals taken for this visit.  Physical Exam:  Constitutional: Patient appears well-developed HEENT:  Head: Normocephalic Eyes:EOM are normal Neck: Normal range of motion Cardiovascular: Normal rate Pulmonary/chest: Effort normal Neurologic: Patient is alert Skin: Skin is warm Psychiatric: Patient has normal mood and affect  Ortho Exam: Ortho exam demonstrates bilateral knees with effusion present.  He has tenderness over the medial and lateral joint lines mildly bilaterally.  No calf tenderness bilaterally.  Negative Homans' sign.  Able to form straight leg raise without extensor lag bilaterally.  No pain with hip range of motion in either hip.  No cellulitis or skin changes noted.  He has range of motion from 2 degrees extension to 120 degrees of knee flexion in the right knee.  Range of motion from 0 degrees extension to 120 degrees of knee flexion in the left knee.  Specialty Comments:  No specialty comments available.  Imaging: No results found.   PMFS History: Patient Active Problem List   Diagnosis Date Noted   Fall 02/11/2022   Prostate cancer ([place]) 05/29/2021   CKD (chronic kidney disease) stage 3, GFR 30-59 ml/min ([facility]) 02/11/2021   CAD (coronary artery disease) 03/06/2020   Hypothyroidism 12/02/2017   Chronic pain of right knee 10/13/2017   Chest pain 11/19/2016   Amaurosis fugax 12/12/2015   PCP NOTES >>> 03/08/2015   Osteoporosis 09/15/2014   Primary osteoarthritis of both knees 03/16/2014   Hearing difficulty 10/20/2013   DJD (degenerative joint disease) 08/25/2013   Neck mass 04/25/2013   Hyperlipidemia 04/25/2013   Erectile dysfunction 12/26/2011   Annual physical exam 08/22/2011   Prostate nodule 08/22/2011   Diabetes  mellitus ([place]) 05/07/2011   Past Medical History:  Diagnosis Date   Amaurosis fugax 2017   Diabetes mellitus    DJD (degenerative joint disease) 08/25/2013   Elevated PSA    Other and unspecified hyperlipidemia 04/25/2013   Prostate cancer ([place])     Family History  Problem Relation Age of Onset   Other Mother        Unknown    Other Father        Unknown   Diabetes Neg Hx    CAD Neg Hx    Cancer Neg Hx     Past Surgical History:  Procedure Laterality Date   CATARACT EXTRACTION     Bilaterally   TYMPANIC MEMBRANE REPAIR     R   Social History   Occupational History   Occupation: Retired  Tobacco Use   Smoking status: Never   Smokeless tobacco: Never  Vaping Use   Vaping Use: Never used  Substance and Sexual Activity   Alcohol use: No   Drug use: No   Sexual activity: Not on file

## 2022-06-16 ENCOUNTER — Telehealth: Payer: Self-pay | Admitting: Orthopedic Surgery

## 2022-06-16 NOTE — Telephone Encounter (Signed)
Patient wanting to get gel injection on the 8th of march. Appointment made.

## 2022-06-18 NOTE — Telephone Encounter (Signed)
VOB submitted for Durolane, right knee.  

## 2022-07-01 DIAGNOSIS — S3210XD Unspecified fracture of sacrum, subsequent encounter for fracture with routine healing: Secondary | ICD-10-CM | POA: Diagnosis not present

## 2022-07-01 DIAGNOSIS — I5032 Chronic diastolic (congestive) heart failure: Secondary | ICD-10-CM | POA: Diagnosis not present

## 2022-07-01 DIAGNOSIS — M810 Age-related osteoporosis without current pathological fracture: Secondary | ICD-10-CM | POA: Diagnosis not present

## 2022-07-01 DIAGNOSIS — M170 Bilateral primary osteoarthritis of knee: Secondary | ICD-10-CM | POA: Diagnosis not present

## 2022-07-03 ENCOUNTER — Other Ambulatory Visit: Payer: Self-pay | Admitting: Internal Medicine

## 2022-07-07 DIAGNOSIS — N130 Hydronephrosis with ureteropelvic junction obstruction: Secondary | ICD-10-CM | POA: Diagnosis not present

## 2022-07-07 DIAGNOSIS — C7919 Secondary malignant neoplasm of other urinary organs: Secondary | ICD-10-CM | POA: Diagnosis not present

## 2022-07-07 LAB — PSA: PSA: 8.36

## 2022-07-10 ENCOUNTER — Other Ambulatory Visit: Payer: Self-pay

## 2022-07-10 DIAGNOSIS — M170 Bilateral primary osteoarthritis of knee: Secondary | ICD-10-CM

## 2022-07-11 ENCOUNTER — Telehealth: Payer: Self-pay

## 2022-07-11 ENCOUNTER — Encounter: Payer: Self-pay | Admitting: Orthopedic Surgery

## 2022-07-11 ENCOUNTER — Ambulatory Visit (INDEPENDENT_AMBULATORY_CARE_PROVIDER_SITE_OTHER): Payer: 59 | Admitting: Orthopedic Surgery

## 2022-07-11 DIAGNOSIS — M1711 Unilateral primary osteoarthritis, right knee: Secondary | ICD-10-CM | POA: Diagnosis not present

## 2022-07-11 DIAGNOSIS — M170 Bilateral primary osteoarthritis of knee: Secondary | ICD-10-CM | POA: Diagnosis not present

## 2022-07-11 DIAGNOSIS — M1712 Unilateral primary osteoarthritis, left knee: Secondary | ICD-10-CM

## 2022-07-11 MED ORDER — SODIUM HYALURONATE 60 MG/3ML IX PRSY
60.0000 mg | PREFILLED_SYRINGE | INTRA_ARTICULAR | Status: AC | PRN
  Administered 2022-07-11: 60 mg via INTRA_ARTICULAR

## 2022-07-11 MED ORDER — LIDOCAINE HCL 1 % IJ SOLN
5.0000 mL | INTRAMUSCULAR | Status: AC | PRN
  Administered 2022-07-11: 5 mL

## 2022-07-11 MED ORDER — BUPIVACAINE HCL 0.25 % IJ SOLN
4.0000 mL | INTRAMUSCULAR | Status: AC | PRN
  Administered 2022-07-11: 4 mL via INTRA_ARTICULAR

## 2022-07-11 NOTE — Telephone Encounter (Signed)
Noted. Will submit in August, 2024.  Patient next available gel injection would need to be after 01/11/2023.

## 2022-07-11 NOTE — Progress Notes (Signed)
   Procedure Note  Patient: Marco [name]             Date of Birth: 1933-05-17           MRN: [id number]             Visit Date: 07/11/2022  Procedures: Visit Diagnoses:  1. Bilateral primary osteoarthritis of knee     Large Joint Inj: R knee on 07/11/2022 7:14 PM Indications: diagnostic evaluation, joint swelling and pain Details: 18 G 1.5 in needle, superolateral approach  Arthrogram: No  Medications: 5 mL lidocaine 1 %; 60 mg Sodium Hyaluronate 60 MG/3ML Outcome: tolerated well, no immediate complications Procedure, treatment alternatives, risks and benefits explained, specific risks discussed. Consent was given by the patient. Immediately prior to procedure a time out was called to verify the correct patient, procedure, equipment, support staff and site/side marked as required. Patient was prepped and draped in the usual sterile fashion.    Large Joint Inj: L knee on 07/11/2022 7:14 PM Indications: diagnostic evaluation, joint swelling and pain Details: 18 G 1.5 in needle, superolateral approach  Arthrogram: No  Medications: 5 mL lidocaine 1 %; 4 mL bupivacaine 0.25 %; 60 mg Sodium Hyaluronate 60 MG/3ML Outcome: tolerated well, no immediate complications Procedure, treatment alternatives, risks and benefits explained, specific risks discussed. Consent was given by the patient. Immediately prior to procedure a time out was called to verify the correct patient, procedure, equipment, support staff and site/side marked as required. Patient was prepped and draped in the usual sterile fashion.     This patient is diagnosed with osteoarthritis of the knee(s).    Radiographs show evidence of joint space narrowing, osteophytes, subchondral sclerosis and/or subchondral cysts.  This patient has knee pain which interferes with functional and activities of daily living.    This patient has experienced inadequate response, adverse effects and/or intolerance with conservative treatments  such as acetaminophen, NSAIDS, topical creams, physical therapy or regular exercise, knee bracing and/or weight loss.   This patient has experienced inadequate response or has a contraindication to intra articular steroid injections for at least 3 months.   This patient is not scheduled to have a total knee replacement within 6 months of starting treatment with viscosupplementation.

## 2022-07-11 NOTE — Telephone Encounter (Signed)
Auth needed bilat knee gel  

## 2022-07-11 NOTE — Telephone Encounter (Signed)
In 6 months correct?

## 2022-07-14 LAB — HM DIABETES FOOT EXAM: HM Diabetic Foot Exam: NEGATIVE

## 2022-07-16 ENCOUNTER — Telehealth: Payer: Self-pay | Admitting: Internal Medicine

## 2022-07-16 NOTE — Telephone Encounter (Signed)
Spoke w/ Pt- he informed me that Dr. Abner Greenspan is wanting a PET scan and CT scan- he tried calling his nurse 2 days ago to see when and where he needed to go but he hasn't heard back. I informed that urology could be working on Biochemist, clinical which can take several days, I recommended he give them a few days and call back if he doesn't hear from them. Pt verbalized understanding.

## 2022-07-16 NOTE — Telephone Encounter (Signed)
Pt called stating that he wanted to speak to [facility] about some imaging another provider recommend that he get. Advised a message would be sent back to give him a call back.

## 2022-07-23 ENCOUNTER — Other Ambulatory Visit (HOSPITAL_COMMUNITY): Payer: Self-pay | Admitting: Urology

## 2022-07-23 DIAGNOSIS — C61 Malignant neoplasm of prostate: Secondary | ICD-10-CM

## 2022-07-30 DIAGNOSIS — M170 Bilateral primary osteoarthritis of knee: Secondary | ICD-10-CM | POA: Diagnosis not present

## 2022-07-30 DIAGNOSIS — M810 Age-related osteoporosis without current pathological fracture: Secondary | ICD-10-CM | POA: Diagnosis not present

## 2022-07-30 DIAGNOSIS — I5032 Chronic diastolic (congestive) heart failure: Secondary | ICD-10-CM | POA: Diagnosis not present

## 2022-07-30 DIAGNOSIS — S3210XD Unspecified fracture of sacrum, subsequent encounter for fracture with routine healing: Secondary | ICD-10-CM | POA: Diagnosis not present

## 2022-08-08 ENCOUNTER — Encounter: Payer: Self-pay | Admitting: Internal Medicine

## 2022-08-30 DIAGNOSIS — S3210XD Unspecified fracture of sacrum, subsequent encounter for fracture with routine healing: Secondary | ICD-10-CM | POA: Diagnosis not present

## 2022-08-30 DIAGNOSIS — I5032 Chronic diastolic (congestive) heart failure: Secondary | ICD-10-CM | POA: Diagnosis not present

## 2022-08-30 DIAGNOSIS — M810 Age-related osteoporosis without current pathological fracture: Secondary | ICD-10-CM | POA: Diagnosis not present

## 2022-08-30 DIAGNOSIS — M170 Bilateral primary osteoarthritis of knee: Secondary | ICD-10-CM | POA: Diagnosis not present

## 2022-09-07 ENCOUNTER — Other Ambulatory Visit: Payer: Self-pay | Admitting: Internal Medicine

## 2022-09-09 ENCOUNTER — Ambulatory Visit (INDEPENDENT_AMBULATORY_CARE_PROVIDER_SITE_OTHER): Payer: 59 | Admitting: *Deleted

## 2022-09-09 DIAGNOSIS — Z0000 Encounter for general adult medical examination without abnormal findings: Secondary | ICD-10-CM | POA: Diagnosis not present

## 2022-09-09 MED ORDER — ACCU-CHEK GUIDE W/DEVICE KIT: PACK | 0 refills | Status: DC

## 2022-09-09 NOTE — Patient Instructions (Signed)
[name] , Thank you for taking time to come for your Medicare Wellness Visit. I appreciate your ongoing commitment to your health goals. Please review the following plan we discussed and let me know if I can assist you in the future.      This is a list of the screening recommended for you and due dates:  Health Maintenance  Topic Date Due   COVID-19 Vaccine (4 - 2023-24 season) 01/03/2022   Hemoglobin A1C  09/08/2022   Flu Shot  12/04/2022   Eye exam for diabetics  03/19/2023   Complete foot exam   07/14/2023   Medicare Annual Wellness Visit  09/09/2023   DTaP/Tdap/Td vaccine (2 - Td or Tdap) 02/12/2032   HPV Vaccine  Aged Out   Pneumonia Vaccine  Discontinued   Zoster (Shingles) Vaccine  Discontinued    Next appointment: Follow up in one year for your annual wellness visit.   Preventive Care 20 Years and Older, Male Preventive care refers to lifestyle choices and visits with your health care provider that can promote health and wellness. What does preventive care include? A yearly physical exam. This is also called an annual well check. Dental exams once or twice a year. Routine eye exams. Ask your health care provider how often you should have your eyes checked. Personal lifestyle choices, including: Daily care of your teeth and gums. Regular physical activity. Eating a healthy diet. Avoiding tobacco and drug use. Limiting alcohol use. Practicing safe sex. Taking low doses of aspirin every day. Taking vitamin and mineral supplements as recommended by your health care provider. What happens during an annual well check? The services and screenings done by your health care provider during your annual well check will depend on your age, overall health, lifestyle risk factors, and family history of disease. Counseling  Your health care provider may ask you questions about your: Alcohol use. Tobacco use. Drug use. Emotional well-being. Home and relationship  well-being. Sexual activity. Eating habits. History of falls. Memory and ability to understand (cognition). Work and work Astronomer. Screening  You may have the following tests or measurements: Height, weight, and BMI. Blood pressure. Lipid and cholesterol levels. These may be checked every 5 years, or more frequently if you are over 106 years old. Skin check. Lung cancer screening. You may have this screening every year starting at age 27 if you have a 30-pack-year history of smoking and currently smoke or have quit within the past 15 years. Fecal occult blood test (FOBT) of the stool. You may have this test every year starting at age 94. Flexible sigmoidoscopy or colonoscopy. You may have a sigmoidoscopy every 5 years or a colonoscopy every 10 years starting at age 84. Prostate cancer screening. Recommendations will vary depending on your family history and other risks. Hepatitis C blood test. Hepatitis B blood test. Sexually transmitted disease (STD) testing. Diabetes screening. This is done by checking your blood sugar (glucose) after you have not eaten for a while (fasting). You may have this done every 1-3 years. Abdominal aortic aneurysm (AAA) screening. You may need this if you are a current or former smoker. Osteoporosis. You may be screened starting at age 57 if you are at high risk. Talk with your health care provider about your test results, treatment options, and if necessary, the need for more tests. Vaccines  Your health care provider may recommend certain vaccines, such as: Influenza vaccine. This is recommended every year. Tetanus, diphtheria, and acellular pertussis (Tdap, Td) vaccine.  You may need a Td booster every 10 years. Zoster vaccine. You may need this after age 59. Pneumococcal 13-valent conjugate (PCV13) vaccine. One dose is recommended after age 9. Pneumococcal polysaccharide (PPSV23) vaccine. One dose is recommended after age 64. Talk to your health care  provider about which screenings and vaccines you need and how often you need them. This information is not intended to replace advice given to you by your health care provider. Make sure you discuss any questions you have with your health care provider. Document Released: 05/18/2015 Document Revised: 01/09/2016 Document Reviewed: 02/20/2015 Elsevier Interactive Patient Education  2017 [place] Prevention in the Home Falls can cause injuries. They can happen to people of all ages. There are many things you can do to make your home safe and to help prevent falls. What can I do on the outside of my home? Regularly fix the edges of walkways and driveways and fix any cracks. Remove anything that might make you trip as you walk through a door, such as a raised step or threshold. Trim any bushes or trees on the path to your home. Use bright outdoor lighting. Clear any walking paths of anything that might make someone trip, such as rocks or tools. Regularly check to see if handrails are loose or broken. Make sure that both sides of any steps have handrails. Any raised decks and porches should have guardrails on the edges. Have any leaves, snow, or ice cleared regularly. Use sand or salt on walking paths during winter. Clean up any spills in your garage right away. This includes oil or grease spills. What can I do in the bathroom? Use night lights. Install grab bars by the toilet and in the tub and shower. Do not use towel bars as grab bars. Use non-skid mats or decals in the tub or shower. If you need to sit down in the shower, use a plastic, non-slip stool. Keep the floor dry. Clean up any water that spills on the floor as soon as it happens. Remove soap buildup in the tub or shower regularly. Attach bath mats securely with double-sided non-slip rug tape. Do not have throw rugs and other things on the floor that can make you trip. What can I do in the bedroom? Use night lights. Make  sure that you have a light by your bed that is easy to reach. Do not use any sheets or blankets that are too big for your bed. They should not hang down onto the floor. Have a firm chair that has side arms. You can use this for support while you get dressed. Do not have throw rugs and other things on the floor that can make you trip. What can I do in the kitchen? Clean up any spills right away. Avoid walking on wet floors. Keep items that you use a lot in easy-to-reach places. If you need to reach something above you, use a strong step stool that has a grab bar. Keep electrical cords out of the way. Do not use floor polish or wax that makes floors slippery. If you must use wax, use non-skid floor wax. Do not have throw rugs and other things on the floor that can make you trip. What can I do with my stairs? Do not leave any items on the stairs. Make sure that there are handrails on both sides of the stairs and use them. Fix handrails that are broken or loose. Make sure that handrails are as long as  the stairways. Check any carpeting to make sure that it is firmly attached to the stairs. Fix any carpet that is loose or worn. Avoid having throw rugs at the top or bottom of the stairs. If you do have throw rugs, attach them to the floor with carpet tape. Make sure that you have a light switch at the top of the stairs and the bottom of the stairs. If you do not have them, ask someone to add them for you. What else can I do to help prevent falls? Wear shoes that: Do not have high heels. Have rubber bottoms. Are comfortable and fit you well. Are closed at the toe. Do not wear sandals. If you use a stepladder: Make sure that it is fully opened. Do not climb a closed stepladder. Make sure that both sides of the stepladder are locked into place. Ask someone to hold it for you, if possible. Clearly mark and make sure that you can see: Any grab bars or handrails. First and last steps. Where the  edge of each step is. Use tools that help you move around (mobility aids) if they are needed. These include: Canes. Walkers. Scooters. Crutches. Turn on the lights when you go into a dark area. Replace any light bulbs as soon as they burn out. Set up your furniture so you have a clear path. Avoid moving your furniture around. If any of your floors are uneven, fix them. If there are any pets around you, be aware of where they are. Review your medicines with your doctor. Some medicines can make you feel dizzy. This can increase your chance of falling. Ask your doctor what other things that you can do to help prevent falls. This information is not intended to replace advice given to you by your health care provider. Make sure you discuss any questions you have with your health care provider. Document Released: 02/15/2009 Document Revised: 09/27/2015 Document Reviewed: 05/26/2014 Elsevier Interactive Patient Education  2017 [employer].

## 2022-09-09 NOTE — Progress Notes (Signed)
Subjective:   Marco [name] is a 89 y.o. male who presents for Medicare Annual/Subsequent preventive examination.  I connected with  Marco [name] on 09/09/22 by a audio enabled telemedicine application and verified that I am speaking with the correct person using two identifiers.  Patient Location: Home  Provider Location: Office/Clinic  I discussed the limitations of evaluation and management by telemedicine. The patient expressed understanding and agreed to proceed.   Review of Systems     Cardiac Risk Factors include: male gender;dyslipidemia;diabetes mellitus     Objective:    There were no vitals filed for this visit. There is no height or weight on file to calculate BMI.     09/09/2022   11:01 AM 02/12/2022    9:36 PM 01/21/2022   10:45 AM 12/27/2021    8:12 PM 09/04/2021    1:10 PM 06/03/2019    1:09 PM 11/19/2016   11:00 PM  Advanced Directives  Does Patient Have a Medical Advance Directive? No No No No No No No  Would patient like information on creating a medical advance directive? No - Patient declined No - Patient declined No - Patient declined No - Patient declined No - Patient declined No - Patient declined No - Patient declined    Current Medications (verified) Outpatient Encounter Medications as of 09/09/2022  Medication Sig   Accu-Chek Softclix Lancets lancets CHECK BLOOD SUGAR ONCE  DAILY AS DIRECTED   acetaminophen (TYLENOL) 500 MG tablet Take 2 tablets (1,000 mg total) by mouth every 6 (six) hours as needed.   Alcohol Swabs PADS Use to clean finger prior to testing blood sugars   atorvastatin (LIPITOR) 40 MG tablet Take 1 tablet (40 mg total) by mouth at bedtime.   Blood Glucose Calibration (ACCU-CHEK GUIDE CONTROL) LIQD Use as directed   Blood Glucose Monitoring Suppl (ACCU-CHEK GUIDE) w/Device KIT Check blood sugars once daily   Calcium Carb-Cholecalciferol (CALCIUM 500 + D) 500-125 MG-UNIT TABS Take 1 tablet by mouth daily.   Cyanocobalamin  (VITAMIN B-12 PO) Take 2,500 mcg by mouth daily.   glucose blood (ACCU-CHEK GUIDE) test strip USE TO CHECK BLOOD SUGAR  ONCE DAILY   levothyroxine (SYNTHROID) 100 MCG tablet Take 1 tablet (100 mcg total) by mouth daily before breakfast.   metFORMIN (GLUCOPHAGE) 500 MG tablet Take 1 tablet (500 mg total) by mouth daily with breakfast.   Multiple Vitamin (MULTI-VITAMIN DAILY PO) Take 1 tablet by mouth daily.   nitroGLYCERIN (NITROSTAT) 0.4 MG SL tablet Place 1 tablet (0.4 mg total) under the tongue every 5 (five) minutes x 3 doses as needed for chest pain. (Patient not taking: Reported on 04/25/2022)   senna-docusate (SENOKOT-S) 8.6-50 MG tablet Take 1 tablet by mouth at bedtime. (Patient not taking: Reported on 03/10/2022)   sitaGLIPtin (JANUVIA) 50 MG tablet Take 1 tablet (50 mg total) by mouth daily.   sodium zirconium cyclosilicate (LOKELMA) 10 g PACK packet 1 pack every other day   XTANDI 40 MG tablet Take 120 mg by mouth daily.   No facility-administered encounter medications on file as of 09/09/2022.    Allergies (verified) Beef-derived products, Chicken allergy, Fish allergy, and Pork-derived products   History: Past Medical History:  Diagnosis Date   Amaurosis fugax 2017   Diabetes mellitus    DJD (degenerative joint disease) 08/25/2013   Elevated PSA    Other and unspecified hyperlipidemia 04/25/2013   Prostate cancer [facility])    Past Surgical History:  Procedure Laterality Date   CATARACT  EXTRACTION     Bilaterally   TYMPANIC MEMBRANE REPAIR     R   Family History  Problem Relation Age of Onset   Other Mother        Unknown    Other Father        Unknown   Diabetes Neg Hx    CAD Neg Hx    Cancer Neg Hx    Social History   Socioeconomic History   Marital status: Legally Separated    Spouse name: Not on file   Number of children: 3   Years of education: Not on file   Highest education level: Not on file  Occupational History   Occupation: Retired  Tobacco Use    Smoking status: Never   Smokeless tobacco: Never  Vaping Use   Vaping Use: Never used  Substance and Sexual Activity   Alcohol use: No   Drug use: No   Sexual activity: Not on file  Other Topics Concern   Not on file  Social History Narrative   Moved to the [place] area 2012     Original from [place] , Married     Lives w/ wife, son and his family    2 daughters, 1 son     Social Determinants of Health   Financial Resource Strain: Low Risk  (10/30/2021)   Overall Financial Resource Strain (CARDIA)    Difficulty of Paying Living Expenses: Not very hard  Food Insecurity: No Food Insecurity (09/09/2022)   Hunger Vital Sign    Worried About Running Out of Food in the Last Year: Never true    Ran Out of Food in the Last Year: Never true  Transportation Needs: No Transportation Needs (09/09/2022)   PRAPARE - Administrator, Civil Service (Medical): No    Lack of Transportation (Non-Medical): No  Physical Activity: Inactive (10/25/2020)   Exercise Vital Sign    Days of Exercise per Week: 0 days    Minutes of Exercise per Session: 0 min  Stress: No Stress Concern Present (09/04/2021)   [employer] of Occupational Health - Occupational Stress Questionnaire    Feeling of Stress : Not at all  Social Connections: Socially Isolated (09/04/2021)   Social Connection and Isolation Panel [NHANES]    Frequency of Communication with Friends and Family: More than three times a week    Frequency of Social Gatherings with Friends and Family: Twice a week    Attends Religious Services: Never    Database administrator or Organizations: No    Attends Engineer, structural: Never    Marital Status: Separated    Tobacco Counseling Counseling given: Not Answered   Clinical Intake:  Pre-visit preparation completed: Yes  Pain : No/denies pain  Nutritional Risks: None Diabetes: Yes CBG done?: No Did pt. bring in CBG monitor from home?: No  How often do you need to have someone  help you when you read instructions, pamphlets, or other written materials from your doctor or pharmacy?: 1 - Never  Activities of Daily Living    09/09/2022   11:03 AM 02/12/2022    9:36 PM  In your present state of health, do you have any difficulty performing the following activities:  Hearing? 0 0  Comment  left ear  Vision? 0   Difficulty concentrating or making decisions? 0   Walking or climbing stairs? 1   Dressing or bathing? 0 1  Doing errands, shopping? 1   Comment son drives him  to appoinments   Preparing Food and eating ? N   Using the Toilet? N   In the past six months, have you accidently leaked urine? N   Do you have problems with loss of bowel control? N   Managing your Medications? N   Managing your Finances? N   Housekeeping or managing your Housekeeping? N     Patient Care Team: Wanda Plump, MD as PCP - General (Internal Medicine) Lovenia Shuck, MD as Referring Physician (Ophthalmology) Henrene Pastor, RPH-CPP (Pharmacist) Jannifer Hick, MD as Consulting Physician (Urology) Anthony Sar, MD as Consulting Physician (Nephrology)  Indicate any recent Medical Services you may have received from other than [facility] providers in the past year (date may be approximate).     Assessment:   This is a routine wellness examination for Marco [name].  Hearing/Vision screen No results found.  Dietary issues and exercise activities discussed: Current Exercise Habits: The patient does not participate in regular exercise at present, Exercise limited by: None identified   Goals Addressed   None    Depression Screen    09/09/2022   11:03 AM 05/15/2022    9:49 AM 02/04/2022    9:26 AM 09/04/2021    1:11 PM 08/13/2021    9:10 AM 05/14/2021    8:49 AM 09/06/2020    9:05 AM  PHQ 2/9 Scores  PHQ - 2 Score 0 0 0 0 0 0 0    Fall Risk    09/09/2022   11:02 AM 05/15/2022    9:49 AM 02/04/2022    9:26 AM 09/04/2021    1:11 PM 08/13/2021    9:10 AM  Fall Risk   Falls in the past  year? 0 1 1 1  0  Number falls in past yr: 0 1 0 0 0  Injury with Fall? 0 1 1 1  0  Risk for fall due to : No Fall Risks   History of fall(s)   Follow up Falls evaluation completed Falls evaluation completed Falls evaluation completed Falls evaluation completed Falls evaluation completed    FALL RISK PREVENTION PERTAINING TO THE HOME:  Any stairs in or around the home? Yes  If so, are there any without handrails? No  Home free of loose throw rugs in walkways, pet beds, electrical cords, etc? Yes  Adequate lighting in your home to reduce risk of falls? Yes   ASSISTIVE DEVICES UTILIZED TO PREVENT FALLS:  Life alert? No  Use of a cane, walker or w/c? Yes  Grab bars in the bathroom? Yes  Shower chair or bench in shower? Yes  Elevated toilet seat or a handicapped toilet? No   TIMED UP AND GO:  Was the test performed?  No, audio visit .    Cognitive Function:    09/09/2022   11:06 AM 07/24/2016    8:26 AM  MMSE - Mini Mental State Exam  Not completed: Unable to complete   Orientation to time  5  Orientation to Place  5  Registration  3  Attention/ Calculation  5  Recall  2  Language- name 2 objects  2  Language- repeat  1  Language- follow 3 step command  3  Language- read & follow direction  1  Write a sentence  1  Copy design  1  Total score  29        09/04/2021    1:17 PM  6CIT Screen  What Year? 0 points  What month? 0 points  What time?  0 points  Count back from 20 0 points  Months in reverse 2 points  Repeat phrase 2 points  Total Score 4 points    Immunizations Immunization History  Administered Date(s) Administered   Fluad Quad(high Dose 65+) 02/04/2022   Influenza-Unspecified 03/05/2021   PFIZER(Purple Top)SARS-COV-2 Vaccination 05/20/2019, 06/10/2019, 02/02/2020   Tdap 02/11/2022    TDAP status: Up to date  Flu Vaccine status: Up to date  Pneumococcal vaccine status: Up to date  Covid-19 vaccine status: Information provided on how to obtain  vaccines.   Qualifies for Shingles Vaccine? Yes   Zostavax completed No   Shingrix Completed?: No.    Education has been provided regarding the importance of this vaccine. Patient has been advised to call insurance company to determine out of pocket expense if they have not yet received this vaccine. Advised may also receive vaccine at local pharmacy or Health Dept. Verbalized acceptance and understanding.  Screening Tests Health Maintenance  Topic Date Due   COVID-19 Vaccine (4 - 2023-24 season) 01/03/2022   HEMOGLOBIN A1C  09/08/2022   INFLUENZA VACCINE  12/04/2022   OPHTHALMOLOGY EXAM  03/19/2023   FOOT EXAM  07/14/2023   Medicare Annual Wellness (AWV)  07/14/2023   DTaP/Tdap/Td (2 - Td or Tdap) 02/12/2032   HPV VACCINES  Aged Out   Pneumonia Vaccine 50+ Years old  Discontinued   Zoster Vaccines- Shingrix  Discontinued    Health Maintenance  Health Maintenance Due  Topic Date Due   COVID-19 Vaccine (4 - 2023-24 season) 01/03/2022   HEMOGLOBIN A1C  09/08/2022    Colorectal cancer screening: No longer required.   Lung Cancer Screening: (Low Dose CT Chest recommended if Age 22-80 years, 30 pack-year currently smoking OR have quit w/in 15years.) does not qualify.    Additional Screening:  Hepatitis C Screening: does not qualify  Vision Screening: Recommended annual ophthalmology exams for early detection of glaucoma and other disorders of the eye. Is the patient up to date with their annual eye exam?  Yes  Who is the provider or what is the name of the office in which the patient attends annual eye exams? Dr. Severiano Gilbert - [facility] If pt is not established with a provider, would they like to be referred to a provider to establish care? No .   Dental Screening: Recommended annual dental exams for proper oral hygiene  Community Resource Referral / Chronic Care Management: CRR required this visit?  No   CCM required this visit?  No      Plan:     I have personally  reviewed and noted the following in the patient's chart:   Medical and social history Use of alcohol, tobacco or illicit drugs  Current medications and supplements including opioid prescriptions. Patient is not currently taking opioid prescriptions. Functional ability and status Nutritional status Physical activity Advanced directives List of other physicians Hospitalizations, surgeries, and ER visits in previous 12 months Vitals Screenings to include cognitive, depression, and falls Referrals and appointments  In addition, I have reviewed and discussed with patient certain preventive protocols, quality metrics, and best practice recommendations. A written personalized care plan for preventive services as well as general preventive health recommendations were provided to patient.   Due to this being a telephonic visit, the after visit summary with patients personalized plan was offered to patient via mail or my-chart. Patient would like to access on my-chart.  Donne Anon, [place]   09/09/2022   Nurse Notes: None

## 2022-09-19 ENCOUNTER — Ambulatory Visit (INDEPENDENT_AMBULATORY_CARE_PROVIDER_SITE_OTHER): Payer: 59 | Admitting: Internal Medicine

## 2022-09-19 ENCOUNTER — Encounter: Payer: Self-pay | Admitting: Internal Medicine

## 2022-09-19 ENCOUNTER — Other Ambulatory Visit (HOSPITAL_BASED_OUTPATIENT_CLINIC_OR_DEPARTMENT_OTHER): Payer: Self-pay

## 2022-09-19 VITALS — BP 126/70 | HR 76 | Temp 98.0°F | Resp 18 | Ht 69.0 in | Wt 138.2 lb

## 2022-09-19 DIAGNOSIS — Z7984 Long term (current) use of oral hypoglycemic drugs: Secondary | ICD-10-CM | POA: Diagnosis not present

## 2022-09-19 DIAGNOSIS — E039 Hypothyroidism, unspecified: Secondary | ICD-10-CM | POA: Diagnosis not present

## 2022-09-19 DIAGNOSIS — M810 Age-related osteoporosis without current pathological fracture: Secondary | ICD-10-CM | POA: Diagnosis not present

## 2022-09-19 DIAGNOSIS — N1832 Chronic kidney disease, stage 3b: Secondary | ICD-10-CM

## 2022-09-19 DIAGNOSIS — E1121 Type 2 diabetes mellitus with diabetic nephropathy: Secondary | ICD-10-CM | POA: Diagnosis not present

## 2022-09-19 DIAGNOSIS — E875 Hyperkalemia: Secondary | ICD-10-CM

## 2022-09-19 DIAGNOSIS — E785 Hyperlipidemia, unspecified: Secondary | ICD-10-CM

## 2022-09-19 LAB — COMPREHENSIVE METABOLIC PANEL
ALT: 11 U/L (ref 0–53)
AST: 20 U/L (ref 0–37)
Albumin: 4.1 g/dL (ref 3.5–5.2)
Alkaline Phosphatase: 67 U/L (ref 39–117)
BUN: 26 mg/dL — ABNORMAL HIGH (ref 6–23)
CO2: 28 mEq/L (ref 19–32)
Calcium: 10.1 mg/dL (ref 8.4–10.5)
Chloride: 102 mEq/L (ref 96–112)
Creatinine, Ser: 1.49 mg/dL (ref 0.40–1.50)
GFR: 41.98 mL/min — ABNORMAL LOW (ref >60.00)
Glucose, Bld: 134 mg/dL — ABNORMAL HIGH (ref 70–99)
Potassium: 5.4 mEq/L — ABNORMAL HIGH (ref 3.5–5.1)
Sodium: 138 mEq/L (ref 135–145)
Total Bilirubin: 0.6 mg/dL (ref 0.2–1.2)
Total Protein: 7.2 g/dL (ref 6.0–8.3)

## 2022-09-19 LAB — TSH: TSH: 2.25 u[IU]/mL (ref 0.35–5.50)

## 2022-09-19 LAB — LIPID PANEL
Cholesterol: 136 mg/dL (ref 0–200)
HDL: 61.3 mg/dL (ref >39.00)
LDL Cholesterol: 57 mg/dL (ref 0–99)
NonHDL: 74.93
Total CHOL/HDL Ratio: 2
Triglycerides: 89 mg/dL (ref 0.0–149.0)
VLDL: 17.8 mg/dL (ref 0.0–40.0)

## 2022-09-19 LAB — HEMOGLOBIN A1C: Hgb A1c MFr Bld: 6.8 % — ABNORMAL HIGH (ref 4.6–6.5)

## 2022-09-19 MED ORDER — NITROGLYCERIN 0.4 MG SL SUBL
0.4000 mg | SUBLINGUAL_TABLET | SUBLINGUAL | 3 refills | Status: DC | PRN
  Filled 2022-09-19: qty 25, 7d supply, fill #0

## 2022-09-19 MED ORDER — SENNOSIDES-DOCUSATE SODIUM 8.6-50 MG PO TABS
1.0000 | ORAL_TABLET | Freq: Every day | ORAL | 0 refills | Status: DC
  Filled 2022-09-19 – 2023-02-18 (×2): qty 100, 100d supply, fill #0

## 2022-09-19 NOTE — Progress Notes (Unsigned)
Subjective:    Patient ID: Marco [name], male    DOB: 1934/02/18, 89 y.o.   MRN: [id number]  DOS:  09/19/2022 Type of visit - description: Follow-up, here with his son.  Since the last office visit things are stable. No recent falls. Requesting refills. Denies chest pain or difficulty breathing. Chronic medical problems reviewed.  Review of Systems See above   Past Medical History:  Diagnosis Date   Amaurosis fugax 2017   Diabetes mellitus    DJD (degenerative joint disease) 08/25/2013   Elevated PSA    Other and unspecified hyperlipidemia 04/25/2013   Prostate cancer [facility])     Past Surgical History:  Procedure Laterality Date   CATARACT EXTRACTION     Bilaterally   TYMPANIC MEMBRANE REPAIR     R    Current Outpatient Medications  Medication Instructions   Accu-Chek Softclix Lancets lancets CHECK BLOOD SUGAR ONCE  DAILY AS DIRECTED   acetaminophen (TYLENOL) 1,000 mg, Oral, Every 6 hours PRN   Alcohol Swabs PADS Use to clean finger prior to testing blood sugars   atorvastatin (LIPITOR) 40 mg, Oral, Daily at bedtime   Blood Glucose Calibration (ACCU-CHEK GUIDE CONTROL) LIQD Use as directed   Blood Glucose Monitoring Suppl (ACCU-CHEK GUIDE) w/Device KIT Check blood sugars once daily   Calcium Carb-Cholecalciferol (CALCIUM 500 + D) 500-125 MG-UNIT TABS 1 tablet, Oral, Daily   Cyanocobalamin (VITAMIN B-12 PO) 2,500 mcg, Oral, Daily   glucose blood (ACCU-CHEK GUIDE) test strip USE TO CHECK BLOOD SUGAR  ONCE DAILY   levothyroxine (SYNTHROID) 100 mcg, Oral, Daily before breakfast   metFORMIN (GLUCOPHAGE) 500 mg, Oral, Daily with breakfast   Multiple Vitamin (MULTI-VITAMIN DAILY PO) 1 tablet, Oral, Daily   nitroGLYCERIN (NITROSTAT) 0.4 mg, Sublingual, Every 5 min x3 PRN   senna-docusate (SENOKOT-S) 8.6-50 MG tablet 1 tablet, Oral, Daily at bedtime   sitaGLIPtin (JANUVIA) 50 mg, Oral, Daily   sodium zirconium cyclosilicate (LOKELMA) 10 g PACK packet 1 pack every other  day   Xtandi 120 mg, Oral, Daily       Objective:   Physical Exam BP 126/70   Pulse 76   Temp 98 F (36.7 C) (Oral)   Resp 18   Ht 5\' 9"  (1.753 m)   Wt 138 lb 4 oz (62.7 kg)   SpO2 99%   BMI 20.42 kg/m  General:   Well developed, NAD, BMI noted. HEENT:  Normocephalic . Face symmetric, atraumatic Lungs:  CTA B Normal respiratory effort, no intercostal retractions, no accessory muscle use. Heart: RRR,  no murmur.  Lower extremities: no pretibial edema bilaterally  Skin: Not pale. Not jaundice Neurologic:  alert & oriented X3.  Speech normal, gait: Assisted by walker. Psych--  Cognition and judgment appear intact.  Cooperative with normal attention span and concentration.  Behavior appropriate. No anxious or depressed appearing.      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 Osteoporosis: T score -1.5, hip fracture Osteopenia : DEXA 2015, Rx calcium and vitamin D.  DEXA 11-2019: Rx Fosamax ED Elevated PSA-prostate nodule:saw urology multiple times, last ~ 2013, declined a bx, declined further eval, aware of risks HOH  PLAN: DM: Reports good compliance with Januvia and metformin.  Checking CMP, A1c, further advised with results. Dyslipidemia: On atorvastatin, checking labs. Hypothyroidism: On Synthroid, check a TSH, adjust meds if needed Bilateral DJD knees: Per Ortho.  Currently on Tylenol, does not take it daily. CAD: Asymptomatic.  Not on ASA, recommend to restart. RF NTG Osteoporosis:  Fosamax is on hold because last year he had a low GFR.  Kidney function is improving, situation d/w  clinical pharmacist: If creatinine clearance is more than 30:  oral biphosphonate  or Prolia  would be ok.   Checking labs.  Awaiting for current GFR. Preventive care: Has consistently declined vaccines RTC CPX 4 months

## 2022-09-19 NOTE — Patient Instructions (Addendum)
    GO TO THE LAB : Get the blood work     GO TO THE FRONT DESK, PLEASE SCHEDULE YOUR APPOINTMENTS Come back for   a physical exam in 4 months    "Health Care Power of attorney" ,  "Living will" (Advance care planning documents)  If you already have a living will or healthcare power of attorney, is recommended you bring the copy to be scanned in your chart.   The document will be available to all the doctors you see in the system.  Advance care planning is a process that supports adults in  understanding and sharing their preferences regarding future medical care.  The patient's preferences are recorded in documents called Advance Directives and the can be modified at any time while the patient is in full mental capacity.   If you don't have one, please consider create one.      More information at: [email or web address]

## 2022-09-20 NOTE — Assessment & Plan Note (Addendum)
DM: Reports good compliance with Januvia and metformin.  Checking CMP, A1c, further advised with results. Dyslipidemia: On atorvastatin, checking labs. Hypothyroidism: On Synthroid, check a TSH, adjust meds if needed Bilateral DJD knees: Per Ortho.  Currently on Tylenol, does not take it daily. CAD: Asymptomatic.  Not on ASA, recommend to restart. RF NTG Osteoporosis:  Fosamax is on hold because last year he had a low GFR.  Kidney function is improving, situation d/w  clinical pharmacist: If creatinine clearance is more than 30: oral biphosphonate  or Prolia  would be ok.   Checking labs.  Awaiting for current GFR. Preventive care: Has consistently declined vaccines RTC CPX 4 months

## 2022-09-29 DIAGNOSIS — I5032 Chronic diastolic (congestive) heart failure: Secondary | ICD-10-CM | POA: Diagnosis not present

## 2022-09-29 DIAGNOSIS — S3210XD Unspecified fracture of sacrum, subsequent encounter for fracture with routine healing: Secondary | ICD-10-CM | POA: Diagnosis not present

## 2022-09-29 DIAGNOSIS — M170 Bilateral primary osteoarthritis of knee: Secondary | ICD-10-CM | POA: Diagnosis not present

## 2022-09-29 DIAGNOSIS — M810 Age-related osteoporosis without current pathological fracture: Secondary | ICD-10-CM | POA: Diagnosis not present

## 2022-10-01 ENCOUNTER — Other Ambulatory Visit (HOSPITAL_BASED_OUTPATIENT_CLINIC_OR_DEPARTMENT_OTHER): Payer: Self-pay

## 2022-10-09 ENCOUNTER — Other Ambulatory Visit (HOSPITAL_BASED_OUTPATIENT_CLINIC_OR_DEPARTMENT_OTHER): Payer: Self-pay

## 2022-10-14 ENCOUNTER — Encounter (HOSPITAL_COMMUNITY)
Admission: RE | Admit: 2022-10-14 | Discharge: 2022-10-14 | Disposition: A | Discharge status: Home / Self Care | Payer: 59 | Source: Ambulatory Visit | Attending: Urology | Admitting: Urology

## 2022-10-14 DIAGNOSIS — C61 Malignant neoplasm of prostate: Secondary | ICD-10-CM | POA: Insufficient documentation

## 2022-10-14 DIAGNOSIS — C7951 Secondary malignant neoplasm of bone: Secondary | ICD-10-CM | POA: Diagnosis not present

## 2022-10-14 DIAGNOSIS — K8689 Other specified diseases of pancreas: Secondary | ICD-10-CM | POA: Diagnosis not present

## 2022-10-14 MED ORDER — TECHNETIUM TC 99M MEDRONATE IV KIT
20.0000 | PACK | Freq: Once | INTRAVENOUS | Status: AC | PRN
  Administered 2022-10-14: 19.4 via INTRAVENOUS

## 2022-10-20 ENCOUNTER — Other Ambulatory Visit: Payer: 59

## 2022-10-21 DIAGNOSIS — N281 Cyst of kidney, acquired: Secondary | ICD-10-CM | POA: Diagnosis not present

## 2022-10-21 DIAGNOSIS — C7919 Secondary malignant neoplasm of other urinary organs: Secondary | ICD-10-CM | POA: Diagnosis not present

## 2022-10-23 ENCOUNTER — Encounter: Payer: Self-pay | Admitting: Internal Medicine

## 2022-10-23 ENCOUNTER — Other Ambulatory Visit: Payer: Self-pay | Admitting: Internal Medicine

## 2022-10-23 ENCOUNTER — Other Ambulatory Visit: Payer: 59

## 2022-10-24 ENCOUNTER — Other Ambulatory Visit (INDEPENDENT_AMBULATORY_CARE_PROVIDER_SITE_OTHER): Payer: 59

## 2022-10-24 DIAGNOSIS — E875 Hyperkalemia: Secondary | ICD-10-CM

## 2022-10-24 LAB — BASIC METABOLIC PANEL
BUN: 30 mg/dL — ABNORMAL HIGH (ref 6–23)
CO2: 24 mEq/L (ref 19–32)
Calcium: 9.9 mg/dL (ref 8.4–10.5)
Chloride: 104 mEq/L (ref 96–112)
Creatinine, Ser: 1.57 mg/dL — ABNORMAL HIGH (ref 0.40–1.50)
GFR: 39.4 mL/min — ABNORMAL LOW (ref >60.00)
Glucose, Bld: 165 mg/dL — ABNORMAL HIGH (ref 70–99)
Potassium: 4.8 mEq/L (ref 3.5–5.1)
Sodium: 140 mEq/L (ref 135–145)

## 2022-10-29 MED ORDER — ALENDRONATE SODIUM 70 MG PO TABS: 70.0000 mg | ORAL_TABLET | ORAL | 3 refills | Status: DC

## 2022-10-30 DIAGNOSIS — M170 Bilateral primary osteoarthritis of knee: Secondary | ICD-10-CM | POA: Diagnosis not present

## 2022-10-30 DIAGNOSIS — M810 Age-related osteoporosis without current pathological fracture: Secondary | ICD-10-CM | POA: Diagnosis not present

## 2022-10-30 DIAGNOSIS — I5032 Chronic diastolic (congestive) heart failure: Secondary | ICD-10-CM | POA: Diagnosis not present

## 2022-10-30 DIAGNOSIS — S3210XD Unspecified fracture of sacrum, subsequent encounter for fracture with routine healing: Secondary | ICD-10-CM | POA: Diagnosis not present

## 2022-11-26 ENCOUNTER — Other Ambulatory Visit: Payer: Self-pay | Admitting: Internal Medicine

## 2022-11-29 DIAGNOSIS — S3210XD Unspecified fracture of sacrum, subsequent encounter for fracture with routine healing: Secondary | ICD-10-CM | POA: Diagnosis not present

## 2022-11-29 DIAGNOSIS — I5032 Chronic diastolic (congestive) heart failure: Secondary | ICD-10-CM | POA: Diagnosis not present

## 2022-11-29 DIAGNOSIS — M810 Age-related osteoporosis without current pathological fracture: Secondary | ICD-10-CM | POA: Diagnosis not present

## 2022-11-29 DIAGNOSIS — M170 Bilateral primary osteoarthritis of knee: Secondary | ICD-10-CM | POA: Diagnosis not present

## 2022-12-07 ENCOUNTER — Other Ambulatory Visit: Payer: Self-pay | Admitting: Internal Medicine

## 2022-12-10 NOTE — Telephone Encounter (Signed)
Rx sent 

## 2022-12-30 DIAGNOSIS — S3210XD Unspecified fracture of sacrum, subsequent encounter for fracture with routine healing: Secondary | ICD-10-CM | POA: Diagnosis not present

## 2022-12-30 DIAGNOSIS — M170 Bilateral primary osteoarthritis of knee: Secondary | ICD-10-CM | POA: Diagnosis not present

## 2022-12-30 DIAGNOSIS — I5032 Chronic diastolic (congestive) heart failure: Secondary | ICD-10-CM | POA: Diagnosis not present

## 2022-12-30 DIAGNOSIS — M810 Age-related osteoporosis without current pathological fracture: Secondary | ICD-10-CM | POA: Diagnosis not present

## 2023-01-09 ENCOUNTER — Ambulatory Visit: Payer: 59 | Admitting: Orthopedic Surgery

## 2023-01-19 DIAGNOSIS — C7951 Secondary malignant neoplasm of bone: Secondary | ICD-10-CM | POA: Diagnosis not present

## 2023-01-19 DIAGNOSIS — C7919 Secondary malignant neoplasm of other urinary organs: Secondary | ICD-10-CM | POA: Diagnosis not present

## 2023-01-21 ENCOUNTER — Other Ambulatory Visit (INDEPENDENT_AMBULATORY_CARE_PROVIDER_SITE_OTHER): Payer: 59

## 2023-01-21 ENCOUNTER — Ambulatory Visit (INDEPENDENT_AMBULATORY_CARE_PROVIDER_SITE_OTHER): Payer: 59 | Admitting: Surgical

## 2023-01-21 ENCOUNTER — Encounter: Payer: Self-pay | Admitting: Orthopedic Surgery

## 2023-01-21 DIAGNOSIS — M25511 Pain in right shoulder: Secondary | ICD-10-CM

## 2023-01-21 DIAGNOSIS — M170 Bilateral primary osteoarthritis of knee: Secondary | ICD-10-CM

## 2023-01-21 MED ORDER — SODIUM HYALURONATE 60 MG/3ML IX PRSY
60.0000 mg | PREFILLED_SYRINGE | INTRA_ARTICULAR | Status: AC | PRN
Start: 2023-01-21 — End: 2023-01-21
  Administered 2023-01-21: 60 mg via INTRA_ARTICULAR

## 2023-01-21 MED ORDER — LIDOCAINE HCL 1 % IJ SOLN
5.0000 mL | INTRAMUSCULAR | Status: AC | PRN
Start: 2023-01-21 — End: 2023-01-21
  Administered 2023-01-21: 5 mL

## 2023-01-21 NOTE — Progress Notes (Signed)
Procedure Note  Patient: Marco [name]             Date of Birth: [birth year]           MRN: [id number]             Visit Date: 01/21/2023  Procedures: Visit Diagnoses:  1. Right shoulder pain, unspecified chronicity     Large Joint Inj: bilateral knee on 01/21/2023 10:54 AM Indications: diagnostic evaluation, joint swelling and pain Details: 18 G 1.5 in needle, superolateral approach  Arthrogram: No  Medications (Right): 5 mL lidocaine 1 %; 60 mg Sodium Hyaluronate 60 MG/3ML Aspirate (Right): 30 mL Medications (Left): 5 mL lidocaine 1 %; 60 mg Sodium Hyaluronate 60 MG/3ML Aspirate (Left): 30 mL Outcome: tolerated well, no immediate complications  Patient returns for bilateral knee gel injection today.  Also reports 2 to 3-week of right shoulder pain without any history of trauma.  Localizes pain to the lateral aspect of the deltoid with no radiation down the arm.  Bothers him with lifting his arm above his head.  Has radiographs taken today demonstrating some narrowing of the acromiohumeral interval but well-preserved glenohumeral joint space.  No fracture/dislocation.  After discussion of options, patient would like to hold off on any further intervention for the shoulder.  Could consider glenohumeral injection in the future if this continues to cause him significant discomfort. Procedure, treatment alternatives, risks and benefits explained, specific risks discussed. Consent was given by the patient. Immediately prior to procedure a time out was called to verify the correct patient, procedure, equipment, support staff and site/side marked as required. Patient was prepped and draped in the usual sterile fashion.

## 2023-01-22 ENCOUNTER — Other Ambulatory Visit: Payer: Self-pay

## 2023-01-22 DIAGNOSIS — M170 Bilateral primary osteoarthritis of knee: Secondary | ICD-10-CM

## 2023-01-27 ENCOUNTER — Ambulatory Visit: Payer: 59 | Admitting: Internal Medicine

## 2023-01-30 DIAGNOSIS — S3210XD Unspecified fracture of sacrum, subsequent encounter for fracture with routine healing: Secondary | ICD-10-CM | POA: Diagnosis not present

## 2023-01-30 DIAGNOSIS — I5032 Chronic diastolic (congestive) heart failure: Secondary | ICD-10-CM | POA: Diagnosis not present

## 2023-01-30 DIAGNOSIS — M170 Bilateral primary osteoarthritis of knee: Secondary | ICD-10-CM | POA: Diagnosis not present

## 2023-01-30 DIAGNOSIS — M810 Age-related osteoporosis without current pathological fracture: Secondary | ICD-10-CM | POA: Diagnosis not present

## 2023-02-04 ENCOUNTER — Other Ambulatory Visit: Payer: Self-pay | Admitting: Internal Medicine

## 2023-02-09 ENCOUNTER — Telehealth: Payer: Self-pay | Admitting: Internal Medicine

## 2023-02-09 MED ORDER — LEVOTHYROXINE SODIUM 100 MCG PO TABS: 100.0000 ug | ORAL_TABLET | Freq: Every day | ORAL | 1 refills | Status: DC

## 2023-02-09 NOTE — Telephone Encounter (Signed)
New Rx sent to [facility]- informing okay to change manufacturer.

## 2023-02-09 NOTE — Addendum Note (Signed)
Addended byConrad [place] D on: 02/09/2023 03:24 PM   Modules accepted: Orders

## 2023-02-09 NOTE — Telephone Encounter (Signed)
[facility] called to get approval on a manufacturer change on pt's levothyroxine. Manufacturer is changing from Amneal to Lupin. Please Advise.   P: [id number] Ref # : [id number]

## 2023-02-11 DIAGNOSIS — S3210XD Unspecified fracture of sacrum, subsequent encounter for fracture with routine healing: Secondary | ICD-10-CM | POA: Diagnosis not present

## 2023-02-11 DIAGNOSIS — M810 Age-related osteoporosis without current pathological fracture: Secondary | ICD-10-CM | POA: Diagnosis not present

## 2023-02-11 DIAGNOSIS — I5032 Chronic diastolic (congestive) heart failure: Secondary | ICD-10-CM | POA: Diagnosis not present

## 2023-02-11 DIAGNOSIS — M170 Bilateral primary osteoarthritis of knee: Secondary | ICD-10-CM | POA: Diagnosis not present

## 2023-02-16 DIAGNOSIS — H40003 Preglaucoma, unspecified, bilateral: Secondary | ICD-10-CM | POA: Diagnosis not present

## 2023-02-16 DIAGNOSIS — H43813 Vitreous degeneration, bilateral: Secondary | ICD-10-CM | POA: Diagnosis not present

## 2023-02-16 DIAGNOSIS — H04123 Dry eye syndrome of bilateral lacrimal glands: Secondary | ICD-10-CM | POA: Diagnosis not present

## 2023-02-16 DIAGNOSIS — H18413 Arcus senilis, bilateral: Secondary | ICD-10-CM | POA: Diagnosis not present

## 2023-02-16 DIAGNOSIS — H40053 Ocular hypertension, bilateral: Secondary | ICD-10-CM | POA: Diagnosis not present

## 2023-02-16 DIAGNOSIS — E119 Type 2 diabetes mellitus without complications: Secondary | ICD-10-CM | POA: Diagnosis not present

## 2023-02-16 DIAGNOSIS — H52203 Unspecified astigmatism, bilateral: Secondary | ICD-10-CM | POA: Diagnosis not present

## 2023-02-16 DIAGNOSIS — H5213 Myopia, bilateral: Secondary | ICD-10-CM | POA: Diagnosis not present

## 2023-02-16 DIAGNOSIS — Z7984 Long term (current) use of oral hypoglycemic drugs: Secondary | ICD-10-CM | POA: Diagnosis not present

## 2023-02-16 DIAGNOSIS — H524 Presbyopia: Secondary | ICD-10-CM | POA: Diagnosis not present

## 2023-02-16 DIAGNOSIS — H43393 Other vitreous opacities, bilateral: Secondary | ICD-10-CM | POA: Diagnosis not present

## 2023-02-16 LAB — HM DIABETES EYE EXAM

## 2023-02-17 ENCOUNTER — Encounter: Payer: Self-pay | Admitting: Internal Medicine

## 2023-02-18 ENCOUNTER — Encounter: Payer: Self-pay | Admitting: Internal Medicine

## 2023-02-18 ENCOUNTER — Ambulatory Visit: Payer: 59 | Admitting: Internal Medicine

## 2023-02-18 VITALS — BP 126/72 | HR 79 | Temp 97.6°F | Resp 16 | Ht 69.0 in | Wt 143.1 lb

## 2023-02-18 DIAGNOSIS — Z23 Encounter for immunization: Secondary | ICD-10-CM

## 2023-02-18 DIAGNOSIS — E039 Hypothyroidism, unspecified: Secondary | ICD-10-CM | POA: Diagnosis not present

## 2023-02-18 DIAGNOSIS — E1121 Type 2 diabetes mellitus with diabetic nephropathy: Secondary | ICD-10-CM | POA: Diagnosis not present

## 2023-02-18 DIAGNOSIS — Z7984 Long term (current) use of oral hypoglycemic drugs: Secondary | ICD-10-CM | POA: Diagnosis not present

## 2023-02-18 DIAGNOSIS — E785 Hyperlipidemia, unspecified: Secondary | ICD-10-CM | POA: Diagnosis not present

## 2023-02-18 DIAGNOSIS — I2510 Atherosclerotic heart disease of native coronary artery without angina pectoris: Secondary | ICD-10-CM

## 2023-02-18 DIAGNOSIS — L84 Corns and callosities: Secondary | ICD-10-CM | POA: Diagnosis not present

## 2023-02-18 LAB — CBC WITH DIFFERENTIAL/PLATELET
Basophils Absolute: 0 10*3/uL (ref 0.0–0.1)
Basophils Relative: 0.9 % (ref 0.0–3.0)
Eosinophils Absolute: 0.4 10*3/uL (ref 0.0–0.7)
Eosinophils Relative: 9.1 % — ABNORMAL HIGH (ref 0.0–5.0)
HCT: 36 % — ABNORMAL LOW (ref 39.0–52.0)
Hemoglobin: 11.4 g/dL — ABNORMAL LOW (ref 13.0–17.0)
Lymphocytes Relative: 26 % (ref 12.0–46.0)
Lymphs Abs: 1.3 10*3/uL (ref 0.7–4.0)
MCHC: 31.5 g/dL (ref 30.0–36.0)
MCV: 86.7 fL (ref 78.0–100.0)
Monocytes Absolute: 0.4 10*3/uL (ref 0.1–1.0)
Monocytes Relative: 8.5 % (ref 3.0–12.0)
Neutro Abs: 2.7 10*3/uL (ref 1.4–7.7)
Neutrophils Relative %: 55.5 % (ref 43.0–77.0)
Platelets: 237 10*3/uL (ref 150.0–400.0)
RBC: 4.16 Mil/uL — ABNORMAL LOW (ref 4.22–5.81)
RDW: 14.2 % (ref 11.5–15.5)
WBC: 4.8 10*3/uL (ref 4.0–10.5)

## 2023-02-18 LAB — BASIC METABOLIC PANEL
BUN: 28 mg/dL — ABNORMAL HIGH (ref 6–23)
CO2: 27 meq/L (ref 19–32)
Calcium: 10 mg/dL (ref 8.4–10.5)
Chloride: 103 meq/L (ref 96–112)
Creatinine, Ser: 1.47 mg/dL (ref 0.40–1.50)
GFR: 42.54 mL/min — ABNORMAL LOW (ref >60.00)
Glucose, Bld: 116 mg/dL — ABNORMAL HIGH (ref 70–99)
Potassium: 4.8 meq/L (ref 3.5–5.1)
Sodium: 140 meq/L (ref 135–145)

## 2023-02-18 LAB — TSH: TSH: 1.98 u[IU]/mL (ref 0.35–5.50)

## 2023-02-18 LAB — HEMOGLOBIN A1C: Hgb A1c MFr Bld: 7.1 % — ABNORMAL HIGH (ref 4.6–6.5)

## 2023-02-18 NOTE — Assessment & Plan Note (Signed)
DM: Continue Januvia, metformin.  Checking A1c. Dyslipidemia: Controlled on atorvastatin. Hypothyroidism: On Synthroid, check TSH. CAD: Controlling CV RF, asymptomatic, does not see cardiology regularly.  Check CBC. Callus, left foot: Chronic, request for treatment.  Refer to podiatry. Osteoporosis: Based on last creatinine, Fosamax restarted. CKD, hyperkalemia: On Lokelma, Last documented visit with renal 12-2021.  Check BMP. Vaccines: Flu shot today.  Has declined COVID and other vaccines consistently, benefits discussed RTC CPX 4 months

## 2023-02-18 NOTE — Patient Instructions (Addendum)
Vaccines I recommend: Covid booster RSV vaccine Pneumonia shot  (PNM 20)   Check the  blood pressure regularly Blood pressure goal:  between 110/65 and  135/85. If it is consistently higher or lower, let me know   Diabetes: You can check your sugars at different times, they right times to do it are: - early in AM fasting  ( blood sugar goal 70-130) - 2 hours after a meal (blood sugar goal less than 180)     GO TO THE LAB : Get the blood work     Next visit with me 4 months, physical exam.  Please schedule it at the front desk

## 2023-02-18 NOTE — Progress Notes (Signed)
Subjective:    Patient ID: Marco [name], male    DOB: [birth year], 89 y.o.   MRN: [id number]  DOS:  02/18/2023 Type of visit - description: Follow-up, here with his son  Routine follow-up. Reports a long history of left foot callus, slightly larger lately, denies any bleeding, some pain.  Other than that he feels well. No recent falls. No chest pain or difficulty breathing.  Chronic medical problems addressed.  Review of Systems See above   Past Medical History:  Diagnosis Date   Amaurosis fugax 2017   Diabetes mellitus    DJD (degenerative joint disease) 08/25/2013   Elevated PSA    Other and unspecified hyperlipidemia 04/25/2013   Prostate cancer [facility])     Past Surgical History:  Procedure Laterality Date   CATARACT EXTRACTION     Bilaterally   TYMPANIC MEMBRANE REPAIR     R    Current Outpatient Medications  Medication Instructions   Accu-Chek Softclix Lancets lancets CHECK BLOOD SUGAR ONCE DAILY AS  DIRECTED   Alcohol Swabs PADS Use to clean finger prior to testing blood sugars   alendronate (FOSAMAX) 70 mg, Oral, Every 7 days, Take with a full glass of water on an empty stomach.   aspirin EC 81 mg, Oral, Daily, Swallow whole.   atorvastatin (LIPITOR) 40 mg, Oral, Daily at bedtime   Blood Glucose Calibration (ACCU-CHEK GUIDE CONTROL) LIQD Use as directed   Blood Glucose Monitoring Suppl (ACCU-CHEK GUIDE) w/Device KIT Check blood sugars once daily   Calcium Carb-Cholecalciferol (CALCIUM 500 + D) 500-125 MG-UNIT TABS 1 tablet, Oral, Daily   Cyanocobalamin (VITAMIN B-12 PO) 2,500 mcg, Oral, Daily   glucose blood (ACCU-CHEK GUIDE) test strip USE TO CHECK BLOOD SUGAR ONCE  DAILY   levothyroxine (SYNTHROID) 100 mcg, Oral, Daily before breakfast   metFORMIN (GLUCOPHAGE) 500 mg, Oral, Daily with breakfast   Multiple Vitamin (MULTI-VITAMIN DAILY PO) 1 tablet, Oral, Daily   nitroGLYCERIN (NITROSTAT) 0.4 mg, Sublingual, Every 5 min x3 PRN   senna-docusate  (SENOKOT-S) 8.6-50 MG tablet 1 tablet, Oral, Daily at bedtime   sitaGLIPtin (JANUVIA) 50 mg, Oral, Daily   sodium zirconium cyclosilicate (LOKELMA) 10 g PACK packet MIX 1 PACKET IN AT LEAST 3  TABLESPOONS OF WATER STIR AND  DRINK IMMEDIATELY EVERY OTHER  DAY   Xtandi 120 mg, Oral, Daily       Objective:   Physical Exam BP 126/72   Pulse 79   Temp 97.6 F (36.4 C) (Oral)   Resp 16   Ht 5\' 9"  (1.753 m)   Wt 143 lb 2 oz (64.9 kg)   SpO2 97%   BMI 21.14 kg/m  General:   Well developed, NAD, BMI noted. HEENT:  Normocephalic . Face symmetric, atraumatic Lungs:  CTA B Normal respiratory effort, no intercostal retractions, no accessory muscle use. Heart: RRR,  no murmur.  L foot: At the base of the great toe has a 2.5 x 1.5 cm thick callus with no redness, discharge or bleeding. Skin: Not pale. Not jaundice Neurologic:  alert & oriented X3.  Speech normal, gait: Assisted by a walker Psych--  Cognition and judgment appear intact.  Cooperative with normal attention span and concentration.  Behavior appropriate. No anxious or depressed appearing.      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 Osteoporosis: T score -1.5, hip fracture Osteopenia : DEXA 2015, Rx calcium and vitamin D.  DEXA 11-2019: Rx Fosamax ED Prostate cancer: Elevated PSA-prostate nodule:saw urology multiple times,   declined a bx until   05/10/2021, + cancer, castration sensitive metastatic.  Right hydronephrosis   HOH  PLAN: DM: Continue Januvia, metformin.  Checking A1c. Dyslipidemia: Controlled on atorvastatin. Hypothyroidism: On Synthroid, check TSH. CAD: Controlling CV RF, asymptomatic,  does not see cardiology regularly.  Check CBC. Callus, left foot: Chronic, request for treatment.  Refer to podiatry. Osteoporosis: Based on last creatinine, Fosamax restarted. CKD, hyperkalemia: On Lokelma, Last documented visit with renal 12-2021.  Check BMP. Vaccines: Flu shot today.  Has declined COVID and other vaccines consistently, benefits discussed RTC CPX 4 months

## 2023-02-19 ENCOUNTER — Other Ambulatory Visit (HOSPITAL_COMMUNITY): Payer: Self-pay

## 2023-02-19 ENCOUNTER — Other Ambulatory Visit: Payer: Self-pay

## 2023-02-19 ENCOUNTER — Encounter: Payer: Self-pay | Admitting: Pharmacist

## 2023-02-20 ENCOUNTER — Other Ambulatory Visit: Payer: Self-pay

## 2023-02-20 ENCOUNTER — Telehealth: Payer: Self-pay | Admitting: Internal Medicine

## 2023-02-20 MED ORDER — SENNOSIDES-DOCUSATE SODIUM 8.6-50 MG PO TABS: 1.0000 | ORAL_TABLET | Freq: Every evening | ORAL | 1 refills | Status: DC | PRN

## 2023-02-20 NOTE — Telephone Encounter (Signed)
Rx sent 

## 2023-02-20 NOTE — Telephone Encounter (Signed)
Fyi pt states he does not use the [facility] pharmacy and was confused by the message he received in [place].   Medication: senna-docusate (SENOKOT-S) 8.6-50 MG tablet  Has the patient contacted their pharmacy? No.   Preferred Pharmacy:   [facility] - [place] - [address] [place] [address] Phone: [phone]  Fax: [phone]

## 2023-03-05 ENCOUNTER — Ambulatory Visit (INDEPENDENT_AMBULATORY_CARE_PROVIDER_SITE_OTHER): Payer: 59 | Admitting: Podiatry

## 2023-03-05 ENCOUNTER — Encounter: Payer: Self-pay | Admitting: Podiatry

## 2023-03-05 DIAGNOSIS — M205X2 Other deformities of toe(s) (acquired), left foot: Secondary | ICD-10-CM | POA: Diagnosis not present

## 2023-03-05 DIAGNOSIS — L84 Corns and callosities: Secondary | ICD-10-CM | POA: Diagnosis not present

## 2023-03-05 NOTE — Progress Notes (Signed)
This patient presents to the office  with painful callus under his big toe joint left foot.  He says he has developed a callus for about 2 years.  His son says he only walks at home.   He has diagnosis of  CKD and diabetes.  Vascular  Dorsalis pedis and posterior tibial pulses are palpable  B/L.  Capillary return  WNL.  Temperature gradient is  WNL.  Skin turgor  WNL  Sensorium  Senn Weinstein monofilament wire  WNL. Normal tactile sensation.  Nail Exam  Patient has normal nails with no evidence of bacterial or fungal infection.  Orthopedic  Exam  Muscle tone and muscle strength  WNL.  No limitations of motion feet  B/L.  No crepitus or joint effusion noted.  Foot type is unremarkable and digits show no abnormalities. Hallux limitus 1st MPJ left foot.  Skin  No open lesions.  Normal skin texture and turgor. Callus sub 1st MPJ left foot.    Callus sub 1st MPJ left due to hallux limitus.  IE.  Debride callus left foot with # 15 blade.  Padding dispensed.  Helane Gunther DPM

## 2023-03-14 DIAGNOSIS — M810 Age-related osteoporosis without current pathological fracture: Secondary | ICD-10-CM | POA: Diagnosis not present

## 2023-03-14 DIAGNOSIS — S3210XD Unspecified fracture of sacrum, subsequent encounter for fracture with routine healing: Secondary | ICD-10-CM | POA: Diagnosis not present

## 2023-03-14 DIAGNOSIS — I5032 Chronic diastolic (congestive) heart failure: Secondary | ICD-10-CM | POA: Diagnosis not present

## 2023-03-14 DIAGNOSIS — M170 Bilateral primary osteoarthritis of knee: Secondary | ICD-10-CM | POA: Diagnosis not present

## 2023-04-08 DIAGNOSIS — C7951 Secondary malignant neoplasm of bone: Secondary | ICD-10-CM | POA: Diagnosis not present

## 2023-04-08 DIAGNOSIS — C7919 Secondary malignant neoplasm of other urinary organs: Secondary | ICD-10-CM | POA: Diagnosis not present

## 2023-04-13 DIAGNOSIS — I5032 Chronic diastolic (congestive) heart failure: Secondary | ICD-10-CM | POA: Diagnosis not present

## 2023-04-13 DIAGNOSIS — M810 Age-related osteoporosis without current pathological fracture: Secondary | ICD-10-CM | POA: Diagnosis not present

## 2023-04-13 DIAGNOSIS — S3210XD Unspecified fracture of sacrum, subsequent encounter for fracture with routine healing: Secondary | ICD-10-CM | POA: Diagnosis not present

## 2023-04-13 DIAGNOSIS — M170 Bilateral primary osteoarthritis of knee: Secondary | ICD-10-CM | POA: Diagnosis not present

## 2023-04-15 ENCOUNTER — Other Ambulatory Visit: Payer: Self-pay | Admitting: Internal Medicine

## 2023-04-16 ENCOUNTER — Other Ambulatory Visit: Payer: Self-pay | Admitting: Internal Medicine

## 2023-06-16 ENCOUNTER — Other Ambulatory Visit: Payer: Self-pay

## 2023-06-16 MED ORDER — NITROGLYCERIN 0.4 MG SL SUBL: 0.4000 mg | SUBLINGUAL_TABLET | SUBLINGUAL | 3 refills | Status: DC | PRN

## 2023-06-19 ENCOUNTER — Telehealth: Payer: Self-pay

## 2023-06-19 DIAGNOSIS — M159 Polyosteoarthritis, unspecified: Secondary | ICD-10-CM

## 2023-06-19 DIAGNOSIS — R269 Unspecified abnormalities of gait and mobility: Secondary | ICD-10-CM

## 2023-06-19 NOTE — Telephone Encounter (Unsigned)
Copied from CRM [id number]. Topic: Clinical - Medication Question >> Jun 19, 2023 12:47 PM [place] J wrote: Reason for CRM: Ram from [employer] in wanting to see if Dr. Drue Novel could create a prescription for patient to get a walker. When updated are available it was requested to reach out to patient.  In order to completed this, the following is needed: Prior Authorization, Patient Demographics Confirmed, Prescription Order.   It was also advised that Dr. Drue Novel faxes over information to:  Fax: [phone]

## 2023-06-19 NOTE — Telephone Encounter (Signed)
Walker order faxed.

## 2023-06-19 NOTE — Telephone Encounter (Signed)
Marco [name]   06/19/23  1:10 PM Unsigned Note Copied from CRM #[id number]. Topic: Clinical - Medication Question >> Jun 19, 2023 12:47 PM Marco [name] wrote: Reason for CRM: Ram from [employer] in wanting to see if Dr. Drue Novel could create a prescription for patient to get a walker. When updated are available it was requested to reach out to patient.   In order to completed this, the following is needed: Prior Authorization, Patient Demographics Confirmed, Prescription Order.    It was also advised that Dr. Drue Novel faxes over information to:  Fax: [phone]

## 2023-06-23 ENCOUNTER — Encounter: Payer: Self-pay | Admitting: Internal Medicine

## 2023-06-23 ENCOUNTER — Ambulatory Visit: Payer: 59 | Admitting: Internal Medicine

## 2023-06-24 ENCOUNTER — Other Ambulatory Visit: Payer: Self-pay | Admitting: Internal Medicine

## 2023-06-26 LAB — PSA: PSA: 35.6

## 2023-07-01 ENCOUNTER — Other Ambulatory Visit: Payer: Self-pay | Admitting: Internal Medicine

## 2023-07-08 DIAGNOSIS — C7951 Secondary malignant neoplasm of bone: Secondary | ICD-10-CM | POA: Diagnosis not present

## 2023-07-08 DIAGNOSIS — C7919 Secondary malignant neoplasm of other urinary organs: Secondary | ICD-10-CM | POA: Diagnosis not present

## 2023-07-09 ENCOUNTER — Other Ambulatory Visit: Payer: Self-pay

## 2023-07-09 ENCOUNTER — Encounter: Payer: Self-pay | Admitting: Internal Medicine

## 2023-07-09 DIAGNOSIS — C61 Malignant neoplasm of prostate: Secondary | ICD-10-CM

## 2023-07-10 ENCOUNTER — Ambulatory Visit: Payer: 59 | Admitting: Internal Medicine

## 2023-07-10 ENCOUNTER — Encounter: Payer: Self-pay | Admitting: Internal Medicine

## 2023-07-10 VITALS — BP 130/86 | HR 79 | Temp 97.8°F | Resp 16 | Ht 69.0 in | Wt 143.2 lb

## 2023-07-10 DIAGNOSIS — E1169 Type 2 diabetes mellitus with other specified complication: Secondary | ICD-10-CM | POA: Diagnosis not present

## 2023-07-10 DIAGNOSIS — Z7984 Long term (current) use of oral hypoglycemic drugs: Secondary | ICD-10-CM

## 2023-07-10 DIAGNOSIS — E039 Hypothyroidism, unspecified: Secondary | ICD-10-CM | POA: Diagnosis not present

## 2023-07-10 DIAGNOSIS — N1832 Chronic kidney disease, stage 3b: Secondary | ICD-10-CM

## 2023-07-10 DIAGNOSIS — R269 Unspecified abnormalities of gait and mobility: Secondary | ICD-10-CM

## 2023-07-10 DIAGNOSIS — M159 Polyosteoarthritis, unspecified: Secondary | ICD-10-CM

## 2023-07-10 LAB — CBC WITH DIFFERENTIAL/PLATELET
Basophils Absolute: 0 10*3/uL (ref 0.0–0.1)
Basophils Relative: 0.6 % (ref 0.0–3.0)
Eosinophils Absolute: 0.4 10*3/uL (ref 0.0–0.7)
Eosinophils Relative: 7.5 % — ABNORMAL HIGH (ref 0.0–5.0)
HCT: 37.3 % — ABNORMAL LOW (ref 39.0–52.0)
Hemoglobin: 11.9 g/dL — ABNORMAL LOW (ref 13.0–17.0)
Lymphocytes Relative: 25.1 % (ref 12.0–46.0)
Lymphs Abs: 1.3 10*3/uL (ref 0.7–4.0)
MCHC: 31.9 g/dL (ref 30.0–36.0)
MCV: 86.4 fl (ref 78.0–100.0)
Monocytes Absolute: 0.5 10*3/uL (ref 0.1–1.0)
Monocytes Relative: 9.3 % (ref 3.0–12.0)
Neutro Abs: 3 10*3/uL (ref 1.4–7.7)
Neutrophils Relative %: 57.5 % (ref 43.0–77.0)
Platelets: 231 10*3/uL (ref 150.0–400.0)
RBC: 4.32 Mil/uL (ref 4.22–5.81)
RDW: 14.4 % (ref 11.5–15.5)
WBC: 5.1 10*3/uL (ref 4.0–10.5)

## 2023-07-10 LAB — BASIC METABOLIC PANEL
BUN: 24 mg/dL — ABNORMAL HIGH (ref 6–23)
CO2: 27 meq/L (ref 19–32)
Calcium: 10 mg/dL (ref 8.4–10.5)
Chloride: 103 meq/L (ref 96–112)
Creatinine, Ser: 1.46 mg/dL (ref 0.40–1.50)
GFR: 42.78 mL/min — ABNORMAL LOW (ref >60.00)
Glucose, Bld: 142 mg/dL — ABNORMAL HIGH (ref 70–99)
Potassium: 5.2 meq/L — ABNORMAL HIGH (ref 3.5–5.1)
Sodium: 139 meq/L (ref 135–145)

## 2023-07-10 LAB — HEMOGLOBIN A1C: Hgb A1c MFr Bld: 7.4 % — ABNORMAL HIGH (ref 4.6–6.5)

## 2023-07-10 LAB — TSH: TSH: 3.47 u[IU]/mL (ref 0.35–5.50)

## 2023-07-10 NOTE — Patient Instructions (Signed)
  Check the  blood pressure regularly Blood pressure goal:  between 110/65 and  135/85. If it is consistently higher or lower, let me know     GO TO THE LAB : Get the blood work     Please go to the front desk: Arrange for office visit in 4 months

## 2023-07-10 NOTE — Progress Notes (Signed)
 Subjective:    Patient ID: Marco [name], male    DOB: [birth year], 89 y.o.   MRN: [id number]  DOS:  07/10/2023 Type of visit - description: Follow-up, here with his son  The patient has no new concerns other than needing a smaller  walker. Denies any falls.   Review of Systems See above   Past Medical History:  Diagnosis Date   Amaurosis fugax 2017   Diabetes mellitus    DJD (degenerative joint disease) 08/25/2013   Elevated PSA    Other and unspecified hyperlipidemia 04/25/2013   Prostate cancer [facility])     Past Surgical History:  Procedure Laterality Date   CATARACT EXTRACTION     Bilaterally   TYMPANIC MEMBRANE REPAIR     R    Current Outpatient Medications  Medication Instructions   Accu-Chek Softclix Lancets lancets CHECK BLOOD SUGAR ONCE DAILY AS  DIRECTED   Alcohol Swabs PADS Use to clean finger prior to testing blood sugars   alendronate (FOSAMAX) 70 mg, Oral, Every 7 days, Take with a full glass of water on an empty stomach.   aspirin EC 81 mg, Daily   atorvastatin (LIPITOR) 40 mg, Oral, Daily at bedtime   Blood Glucose Calibration (ACCU-CHEK GUIDE CONTROL) LIQD Use as directed   Blood Glucose Monitoring Suppl (ACCU-CHEK GUIDE) w/Device KIT Check blood sugars once daily   Calcium Carb-Cholecalciferol (CALCIUM 500 + D) 500-125 MG-UNIT TABS 1 tablet, Daily   Cyanocobalamin (VITAMIN B-12 PO) 2,500 mcg, Daily   glucose blood (ACCU-CHEK GUIDE) test strip USE TO CHECK BLOOD SUGAR ONCE  DAILY   levothyroxine (SYNTHROID) 100 mcg, Oral, Daily before breakfast   metFORMIN (GLUCOPHAGE) 500 mg, Oral, Daily with breakfast   Multiple Vitamin (MULTI-VITAMIN DAILY PO) 1 tablet, Daily   nitroGLYCERIN (NITROSTAT) 0.4 mg, Sublingual, Every 5 min x3 PRN   senna-docusate (SENOKOT-S) 8.6-50 MG tablet 1 tablet, Oral, At bedtime PRN   sitaGLIPtin (JANUVIA) 50 mg, Oral, Daily   sodium zirconium cyclosilicate (LOKELMA) 10 g PACK packet MIX 1 PACKET IN AT LEAST 3  TABLESPOONS OF  WATER STIR AND  DRINK IMMEDIATELY EVERY OTHER  DAY   Xtandi 120 mg, Daily       Objective:   Physical Exam BP 130/86   Pulse 79   Temp 97.8 F (36.6 C) (Oral)   Resp 16   Ht 5\' 9"  (1.753 m)   Wt 143 lb 4 oz (65 kg)   SpO2 98%   BMI 21.15 kg/m  General:   Well developed, NAD, BMI noted.  HEENT:  Normocephalic . Face symmetric, atraumatic Lungs:  CTA B Normal respiratory effort, no intercostal retractions, no accessory muscle use. Heart: RRR,  no murmur.  Abdomen:  Not distended, soft, non-tender. No rebound or rigidity.   Skin: Not pale. Not jaundice Lower extremities: no pretibial edema bilaterally  Neurologic:  alert & oriented X3.  Speech normal, gait assisted by walker  psych--  Cognition and judgment appear intact.  Cooperative with normal attention span and concentration.  Behavior appropriate. No anxious or depressed appearing.     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 Osteoporosis: T score -1.5, hip fracture Osteopenia : DEXA 2015, Rx calcium and vitamin D.  DEXA 11-2019: Rx Fosamax ED Prostate cancer: Elevated PSA-prostate nodule:saw urology multiple times,   declined a bx until   05/10/2021, + cancer, castration sensitive metastatic.  Right hydronephrosis   HOH  PLAN: DM: Last A1c 7.1, no changes made, on Januvia 50 mg qd  and metformin 500 mg daily.  Recheck A1c. Hypothyroidism: Reports good med compliance, check TSH. CKD: I do not think he has seen the kidney doctor in a while, check BMP and CBC. Osteoporosis: T-score 2021 (-) 1.5, history of hip fracture.  On Fosamax, first prescribed 2021, was temporarily held due to decreased kidney  function, currently on 70 mg weekly. He will have multiple tests ordered by urology, hold recheck DEXA.  Continue Fosamax. Metastatic prostate cancer, LOV urology 2 days ago, PSA continues rising. They discussed continue holding Xgeva, palliative radiation for skeletal metastasis.  They are planning multiple imagings Gait disorder: Prescription for a smaller wheelchair sent. RTC 4 months

## 2023-07-11 ENCOUNTER — Telehealth: Payer: Self-pay | Admitting: Genetic Counselor

## 2023-07-11 NOTE — Telephone Encounter (Signed)
 Patient is aware of scheduled appointment times/dates with Genetics Counselor

## 2023-07-11 NOTE — Assessment & Plan Note (Addendum)
 DM: Last A1c 7.1, no changes made, on Januvia 50 mg qd  and metformin 500 mg daily.  Recheck A1c. Hypothyroidism: Reports good med compliance, check TSH. CKD: I do not think he has seen the kidney doctor in a while, check BMP and CBC. Osteoporosis: T-score 2021 (-) 1.5, history of hip fracture.  On Fosamax, first prescribed 2021, was temporarily held due to decreased kidney function, currently on 70 mg weekly. He will have multiple tests ordered by urology, hold recheck DEXA.  Continue Fosamax. Metastatic prostate cancer, LOV urology 2 days ago, PSA continues rising. They discussed continue holding Xgeva, palliative radiation for skeletal metastasis.  They are planning multiple imagings Gait disorder: Prescription for a smaller wheelchair sent. RTC 4 months

## 2023-07-12 ENCOUNTER — Encounter: Payer: Self-pay | Admitting: Internal Medicine

## 2023-07-13 ENCOUNTER — Telehealth: Payer: Self-pay | Admitting: Genetic Counselor

## 2023-07-20 ENCOUNTER — Other Ambulatory Visit (HOSPITAL_COMMUNITY): Payer: Self-pay | Admitting: Urology

## 2023-07-20 DIAGNOSIS — C61 Malignant neoplasm of prostate: Secondary | ICD-10-CM

## 2023-07-20 DIAGNOSIS — C7951 Secondary malignant neoplasm of bone: Secondary | ICD-10-CM

## 2023-08-01 ENCOUNTER — Other Ambulatory Visit: Payer: Self-pay | Admitting: Internal Medicine

## 2023-08-04 ENCOUNTER — Telehealth: Payer: Self-pay | Admitting: Internal Medicine

## 2023-08-04 NOTE — Telephone Encounter (Signed)
 Copied from CRM [id number]. Topic: Medicare AWV >> Aug 04, 2023  9:48 AM Payton Doughty wrote: Reason for CRM: Called LVM 08/04/2023 to schedule AWV. Please schedule Virtual or Telehealth visits ONLY.   Verlee Rossetti; Care Guide Ambulatory Clinical Support [facility] Direct Dial: [phone]

## 2023-08-06 ENCOUNTER — Encounter (HOSPITAL_COMMUNITY)
Admission: RE | Admit: 2023-08-06 | Discharge: 2023-08-06 | Disposition: A | Discharge status: Home / Self Care | Source: Ambulatory Visit | Attending: Urology | Admitting: Urology

## 2023-08-06 DIAGNOSIS — C61 Malignant neoplasm of prostate: Secondary | ICD-10-CM | POA: Diagnosis present

## 2023-08-06 DIAGNOSIS — C7952 Secondary malignant neoplasm of bone marrow: Secondary | ICD-10-CM | POA: Insufficient documentation

## 2023-08-06 DIAGNOSIS — C7951 Secondary malignant neoplasm of bone: Secondary | ICD-10-CM | POA: Insufficient documentation

## 2023-08-06 MED ORDER — FLOTUFOLASTAT F 18 GALLIUM 296-5846 MBQ/ML IV SOLN
8.0000 | Freq: Once | INTRAVENOUS | Status: AC
  Administered 2023-08-06: 8 via INTRAVENOUS

## 2023-08-23 ENCOUNTER — Other Ambulatory Visit: Payer: Self-pay | Admitting: Internal Medicine

## 2023-09-02 ENCOUNTER — Ambulatory Visit: Payer: 59 | Admitting: Podiatry

## 2023-09-14 ENCOUNTER — Inpatient Hospital Stay

## 2023-09-14 ENCOUNTER — Inpatient Hospital Stay: Attending: Internal Medicine | Admitting: Genetic Counselor

## 2023-09-14 DIAGNOSIS — C61 Malignant neoplasm of prostate: Secondary | ICD-10-CM

## 2023-09-14 DIAGNOSIS — Z800 Family history of malignant neoplasm of digestive organs: Secondary | ICD-10-CM | POA: Diagnosis not present

## 2023-09-14 LAB — GENETIC SCREENING ORDER

## 2023-09-14 NOTE — Progress Notes (Unsigned)
 REFERRING PROVIDER: Ezell Hollow, MD [address] [place],  [place] [address]  PRIMARY PROVIDER:  Ezell Hollow, MD  PRIMARY REASON FOR VISIT:  No diagnosis found.   HISTORY OF PRESENT ILLNESS:   Mr. Biehle, a 89 y.o. male, was seen for a [facility] cancer genetics consultation at the request of Dr.*** Neomi Banks {Provider title:[id number]}*** due to a {Personal/family:20331} history of {cancer/polyps}.  Mr. Knackstedt presents to clinic today to discuss the possibility of a hereditary predisposition to cancer, to discuss genetic testing, and to further clarify his future cancer risks, as well as potential cancer risks for family members.   Mr. Kizewski is a 89 y.o. male with no personal history of cancer.      CANCER HISTORY:  Oncology History   No history exists.     SCREENING/RISK FACTORS:  Colonoscopy: yes; no polyps per patient.  Blood transfusion within past 4 weeks: no    Past Medical History:  Diagnosis Date   Amaurosis fugax 2017   Diabetes mellitus    DJD (degenerative joint disease) 08/25/2013   Elevated PSA    Other and unspecified hyperlipidemia 04/25/2013   Prostate cancer [facility])     Past Surgical History:  Procedure Laterality Date   CATARACT EXTRACTION     Bilaterally   TYMPANIC MEMBRANE REPAIR     R    FAMILY HISTORY:  We obtained a detailed, 4-generation family history.  Significant diagnoses are listed below: Family History  Problem Relation Age of Onset   Other Mother        Unknown    Other Father        Unknown   Diabetes Neg Hx    CAD Neg Hx    Cancer Neg Hx     Mr. Nyborg is {aware/unaware} of previous family history of genetic testing for hereditary cancer risks. Patient's maternal ancestors are of *** descent, and paternal ancestors are of *** descent. There {IS NO:12509} reported Ashkenazi Jewish ancestry. There {IS NO:12509} known consanguinity.  GENETIC COUNSELING ASSESSMENT: Mr. Birdsong is a 89 y.o. male with a {Personal/family:20331}  history of {cancer/polyps} which is somewhat suggestive of a {DISEASE} and predisposition to cancer given ***. We, therefore, discussed and recommended the following at today's visit.   DISCUSSION: We discussed that *** - ***% of *** is hereditary.  Most cases of *** associated with ***.  There are other genes that can be associated with hereditary *** cancer syndromes.  These include ***.  We discussed that testing is beneficial for several reasons including knowing how to follow individuals for their cancer risks, identifying whether potential treatment options *** would be beneficial, and understanding if other family members could be at an increased risk for cancer and allowing them to undergo genetic testing.   We reviewed the characteristics, features and inheritance patterns of hereditary cancer syndromes. We also discussed genetic testing, including the appropriate family members to test, the process of testing, insurance coverage and turn-around-time for results. We discussed the implications of a negative, positive, carrier and/or variant of uncertain significant result. We recommended Mr. Solla pursue genetic testing for a panel that includes genes associated with *** cancer.   Mr. Colston  was offered a common hereditary cancer panel (~40 genes) and an expanded pan-cancer panel (~70 genes). Mr. Allred was informed of the benefits and limitations of each panel, including that expanded pan-cancer panels contain genes that do not have clear management guidelines at this point in time.  We  also discussed that as the number of genes included on a panel increases, the chances of variants of uncertain significance increases.  After considering the benefits and limitations of each gene panel, Mr. Mccandlish  elected to have *** through ***.   Based on Mr. Szafran {Personal/family:20331} history of cancer, he meets medical criteria for genetic testing. Despite that he meets criteria, he may still have an out of  pocket cost. We discussed that if his out of pocket cost for testing is over $100, the laboratory should contact him and discuss the self-pay prices and/or patient pay assistance programs.    ***We reviewed the characteristics, features and inheritance patterns of hereditary cancer syndromes. We also discussed genetic testing, including the appropriate family members to test, the process of testing, insurance coverage and turn-around-time for results. We discussed the implications of a negative, positive and/or variant of uncertain significant result. In order to get genetic test results in a timely manner so that Mr. Haag can use these genetic test results for surgical decisions, we recommended Mr. Leandro pursue genetic testing for the ***. Once complete, we recommend Mr. Noah pursue reflex genetic testing to the *** gene panel.   Based on Mr. Felipe {Personal/family:20331} history of cancer, he meets medical criteria for genetic testing. Despite that he meets criteria, he may still have an out of pocket cost.   ***We discussed with Mr. Wanless that the {Personal/family:20331} history does not meet insurance or NCCN criteria for genetic testing and, therefore, is not highly consistent with a familial hereditary cancer syndrome.  We feel he is at low risk to harbor  a gene mutation associated with such a condition. Thus, we did not recommend any genetic testing, at this time, and recommended Mr. Cohen continue to follow the cancer screening guidelines given by his primary healthcare provider.  ***In order to estimate his chance of having a {CA GENE:62345} mutation, we used statistical models ({GENMODELS:62370}) that consider his personal medical history, family history and ancestry.  Because each model is different, there can be a lot of variability in the risks they give.  Therefore, these numbers must be considered a rough range and not a precise risk of having a {CA GENE:62345} mutation.  These models  estimate that she has approximately a ***-***% chance of having a mutation. Based on this assessment of her family and personal history, genetic testing {IS/ISNOT:34056} recommended.  ***Based on the patient's {Personal/family:20331} history, a statistical model ({GENMODELS:62370}) was used to estimate his risk of developing {CA HX:54794}. This estimates his lifetime risk of developing {CA HX:54794} to be approximately ***%. This estimation does not consider any genetic testing results.  The patient's lifetime breast cancer risk is a preliminary estimate based on available information using one of several models endorsed by the American Cancer Society (ACS). The ACS recommends consideration of breast MRI screening as an adjunct to mammography for patients at high risk (defined as 20% or greater lifetime risk).   ***Mr. Chung has been determined to be at high risk for breast cancer.  Therefore, we recommend that annual screening with mammography and breast MRI be performed.  ***begin at age 79, or 10 years prior to the age of breast cancer diagnosis in a relative (whichever is earlier).  We discussed that Mr. Nattress should discuss her individual situation with her referring physician and determine a breast cancer screening plan with which they are both comfortable.    PLAN: After considering the risks, benefits, and limitations, Mr. Pearo provided informed consent to  pursue genetic testing and the blood sample was sent to {Lab} Laboratories for analysis of the {test}. Results should be available within approximately {TAT TIME} weeks, at which point they will be disclosed by telephone*** to Mr. Pouliot, as will any additional recommendations warranted by these results. Mr. Mazzoni will receive a summary of his genetic counseling visit and a copy of his results once available. This information will also be available in Epic.   *** Despite our recommendation, Mr. Aguino did not wish to pursue genetic testing at  today's visit. We understand this decision and remain available to coordinate genetic testing at any time in the future. We, therefore, recommend Mr. Marchewka continue to follow the cancer screening guidelines given by his primary healthcare provider.  ***Based on Mr. Sakai family history, we recommended his ***, who was diagnosed with *** at age ***, have genetic counseling and testing. Mr. Skousen can let us  know if we can be of any assistance in coordinating genetic counseling and/or testing for appropriate relatives.   Lastly, we encouraged Mr. Vautour to remain in contact with cancer genetics annually so that we can continuously update the family history and inform him of any changes in cancer genetics and testing that may be of benefit for this family.   Mr. Bosshardt questions were answered to his satisfaction today. Our contact information was provided should additional questions or concerns arise. Thank you for the referral and allowing us  to share in the care of your patient.   Waniya Hoglund M. Ora Billing, MS, [facility] Genetic Counselor [email or web address] (P) [phone]   *** minutes were spent on the date of the encounter in service to the patient including preparation, face-to-face consultation, documentation and care coordination.  ***The patient was accompanied by ***.  ***The patient was seen alone.  Drs. Iruku, Gudena and/or Maryalice Smaller were available to discuss this case as needed.    _______________________________________________________________________ For Office Staff:  Number of people involved in session: *** Was an Intern/ student involved with case: {YES/NO:63}

## 2023-09-15 ENCOUNTER — Encounter: Payer: Self-pay | Admitting: Genetic Counselor

## 2023-09-23 DIAGNOSIS — C7919 Secondary malignant neoplasm of other urinary organs: Secondary | ICD-10-CM | POA: Diagnosis not present

## 2023-09-23 DIAGNOSIS — C7951 Secondary malignant neoplasm of bone: Secondary | ICD-10-CM | POA: Diagnosis not present

## 2023-09-26 ENCOUNTER — Encounter (HOSPITAL_COMMUNITY): Payer: Self-pay

## 2023-09-26 ENCOUNTER — Emergency Department (HOSPITAL_COMMUNITY)

## 2023-09-26 ENCOUNTER — Other Ambulatory Visit: Payer: Self-pay

## 2023-09-26 ENCOUNTER — Inpatient Hospital Stay (HOSPITAL_COMMUNITY)
Admission: EM | Admit: 2023-09-26 | Discharge: 2023-09-30 | DRG: 481 | Disposition: A | Discharge status: Skilled Nursing Facility | Attending: Internal Medicine | Admitting: Internal Medicine

## 2023-09-26 DIAGNOSIS — E039 Hypothyroidism, unspecified: Secondary | ICD-10-CM | POA: Diagnosis present

## 2023-09-26 DIAGNOSIS — E875 Hyperkalemia: Secondary | ICD-10-CM | POA: Diagnosis not present

## 2023-09-26 DIAGNOSIS — Z800 Family history of malignant neoplasm of digestive organs: Secondary | ICD-10-CM

## 2023-09-26 DIAGNOSIS — S72142A Displaced intertrochanteric fracture of left femur, initial encounter for closed fracture: Principal | ICD-10-CM

## 2023-09-26 DIAGNOSIS — Y92009 Unspecified place in unspecified non-institutional (private) residence as the place of occurrence of the external cause: Secondary | ICD-10-CM

## 2023-09-26 DIAGNOSIS — D62 Acute posthemorrhagic anemia: Secondary | ICD-10-CM | POA: Diagnosis not present

## 2023-09-26 DIAGNOSIS — E1122 Type 2 diabetes mellitus with diabetic chronic kidney disease: Secondary | ICD-10-CM | POA: Diagnosis not present

## 2023-09-26 DIAGNOSIS — E7849 Other hyperlipidemia: Secondary | ICD-10-CM | POA: Diagnosis not present

## 2023-09-26 DIAGNOSIS — M21752 Unequal limb length (acquired), left femur: Secondary | ICD-10-CM | POA: Diagnosis not present

## 2023-09-26 DIAGNOSIS — Z7982 Long term (current) use of aspirin: Secondary | ICD-10-CM | POA: Diagnosis not present

## 2023-09-26 DIAGNOSIS — W010XXA Fall on same level from slipping, tripping and stumbling without subsequent striking against object, initial encounter: Secondary | ICD-10-CM | POA: Diagnosis present

## 2023-09-26 DIAGNOSIS — Z91013 Allergy to seafood: Secondary | ICD-10-CM | POA: Diagnosis not present

## 2023-09-26 DIAGNOSIS — E119 Type 2 diabetes mellitus without complications: Secondary | ICD-10-CM | POA: Diagnosis not present

## 2023-09-26 DIAGNOSIS — S72142D Displaced intertrochanteric fracture of left femur, subsequent encounter for closed fracture with routine healing: Secondary | ICD-10-CM | POA: Diagnosis not present

## 2023-09-26 DIAGNOSIS — Z8679 Personal history of other diseases of the circulatory system: Secondary | ICD-10-CM

## 2023-09-26 DIAGNOSIS — K5903 Drug induced constipation: Secondary | ICD-10-CM | POA: Diagnosis not present

## 2023-09-26 DIAGNOSIS — Z7983 Long term (current) use of bisphosphonates: Secondary | ICD-10-CM | POA: Diagnosis not present

## 2023-09-26 DIAGNOSIS — T40605A Adverse effect of unspecified narcotics, initial encounter: Secondary | ICD-10-CM | POA: Diagnosis not present

## 2023-09-26 DIAGNOSIS — G453 Amaurosis fugax: Secondary | ICD-10-CM | POA: Diagnosis present

## 2023-09-26 DIAGNOSIS — Y9301 Activity, walking, marching and hiking: Secondary | ICD-10-CM | POA: Diagnosis present

## 2023-09-26 DIAGNOSIS — W19XXXA Unspecified fall, initial encounter: Secondary | ICD-10-CM

## 2023-09-26 DIAGNOSIS — Z7984 Long term (current) use of oral hypoglycemic drugs: Secondary | ICD-10-CM | POA: Diagnosis not present

## 2023-09-26 DIAGNOSIS — N1832 Chronic kidney disease, stage 3b: Secondary | ICD-10-CM | POA: Diagnosis present

## 2023-09-26 DIAGNOSIS — R7989 Other specified abnormal findings of blood chemistry: Secondary | ICD-10-CM | POA: Diagnosis not present

## 2023-09-26 DIAGNOSIS — R0989 Other specified symptoms and signs involving the circulatory and respiratory systems: Secondary | ICD-10-CM | POA: Diagnosis not present

## 2023-09-26 DIAGNOSIS — C7951 Secondary malignant neoplasm of bone: Secondary | ICD-10-CM | POA: Diagnosis present

## 2023-09-26 DIAGNOSIS — I2510 Atherosclerotic heart disease of native coronary artery without angina pectoris: Secondary | ICD-10-CM | POA: Diagnosis not present

## 2023-09-26 DIAGNOSIS — E785 Hyperlipidemia, unspecified: Secondary | ICD-10-CM | POA: Diagnosis not present

## 2023-09-26 DIAGNOSIS — Z91014 Allergy to mammalian meats: Secondary | ICD-10-CM

## 2023-09-26 DIAGNOSIS — Z794 Long term (current) use of insulin: Secondary | ICD-10-CM

## 2023-09-26 DIAGNOSIS — M25552 Pain in left hip: Secondary | ICD-10-CM | POA: Diagnosis not present

## 2023-09-26 DIAGNOSIS — R6889 Other general symptoms and signs: Secondary | ICD-10-CM | POA: Diagnosis not present

## 2023-09-26 DIAGNOSIS — E038 Other specified hypothyroidism: Secondary | ICD-10-CM | POA: Diagnosis not present

## 2023-09-26 DIAGNOSIS — Y92018 Other place in single-family (private) house as the place of occurrence of the external cause: Secondary | ICD-10-CM

## 2023-09-26 DIAGNOSIS — Z79899 Other long term (current) drug therapy: Secondary | ICD-10-CM | POA: Diagnosis not present

## 2023-09-26 DIAGNOSIS — C61 Malignant neoplasm of prostate: Secondary | ICD-10-CM | POA: Diagnosis not present

## 2023-09-26 DIAGNOSIS — I499 Cardiac arrhythmia, unspecified: Secondary | ICD-10-CM | POA: Diagnosis not present

## 2023-09-26 DIAGNOSIS — R9431 Abnormal electrocardiogram [ECG] [EKG]: Secondary | ICD-10-CM | POA: Diagnosis not present

## 2023-09-26 DIAGNOSIS — R739 Hyperglycemia, unspecified: Secondary | ICD-10-CM | POA: Diagnosis not present

## 2023-09-26 DIAGNOSIS — Z79890 Hormone replacement therapy (postmenopausal): Secondary | ICD-10-CM

## 2023-09-26 DIAGNOSIS — M47816 Spondylosis without myelopathy or radiculopathy, lumbar region: Secondary | ICD-10-CM | POA: Diagnosis not present

## 2023-09-26 DIAGNOSIS — Z043 Encounter for examination and observation following other accident: Secondary | ICD-10-CM | POA: Diagnosis not present

## 2023-09-26 DIAGNOSIS — S72002A Fracture of unspecified part of neck of left femur, initial encounter for closed fracture: Principal | ICD-10-CM

## 2023-09-26 DIAGNOSIS — N183 Chronic kidney disease, stage 3 unspecified: Secondary | ICD-10-CM | POA: Diagnosis present

## 2023-09-26 NOTE — ED Triage Notes (Signed)
 Arrives [facility] from home after a mechanical fall. Tripped while walking and landed on carpet flooring.   Left hip pain with subjective shortening and outward rotation.   EMS admin 50mcg fentanyl enroute to ED.

## 2023-09-26 NOTE — Progress Notes (Signed)
 Patient ID: Marco [name], male   DOB: 11-22-1933, 89 y.o.   MRN: [id number] The patient is a 89 year old gentleman who had a mechanical fall injuring his left hip.  I reviewed the x-rays and it looks like he has an intertrochanteric left hip fracture.  He has apparently seen one of my partners Dr. Rozelle Corning in the office before for other orthopedic issues.  This type of fracture will require surgical intervention to stabilize the fracture and to allow the person to be able to mobilize better.  He should be n.p.o. after midnight tonight in anticipation of surgery tomorrow.  He is not on blood thinning medication.  Unfortunately he is dealing with metastatic prostate cancer.  The EDP is going to consult the hospitalist service for medical management and clearance in anticipation of surgery tomorrow.

## 2023-09-26 NOTE — ED Provider Notes (Signed)
 [place] EMERGENCY DEPARTMENT AT [facility] Provider Note   CSN: [id number] Arrival date & time: 09/26/23  2241     History {Add pertinent medical, surgical, social history, OB history to HPI:1} Chief Complaint  Patient presents with   Fall    Marco [name] is a 89 y.o. male.  The history is provided by the patient and medical records.  Fall   89 year old male with history of coronary artery disease, CKD, diabetes, hyperlipidemia, presenting to the ED after a fall.  Patient states he had just walked to the kitchen to take his medicine and was walking back to the couch when he tripped and landed on his left hip on carpet.  There was no head injury or loss of consciousness.  Unable to get up off the floor and EMS was called.  Found to have some shortening and external rotation.  Denies any prior left hip injuries or surgeries.  He is not currently on anticoagulation.  Was recently diagnosed with prostate cancer with bony mets, due to start chemotherapy soon.  Home Medications Prior to Admission medications   Medication Sig Start Date End Date Taking? Authorizing Provider  Accu-Chek Softclix Lancets lancets CHECK BLOOD SUGAR ONCE DAILY AS  DIRECTED 10/24/22   Marco Hollow, MD  Alcohol  Swabs  PADS Use to clean finger prior to testing blood sugars 03/13/21   Marco Hollow, MD  alendronate  (FOSAMAX ) 70 MG tablet Take 1 tablet (70 mg total) by mouth every 7 (seven) days. Take with a full glass of water on an empty stomach. 10/29/22   Marco Hollow, MD  aspirin  EC 81 MG tablet Take 81 mg by mouth daily. Swallow whole.    [provider]  atorvastatin  (LIPITOR) 40 MG tablet Take 1 tablet (40 mg total) by mouth at bedtime. 08/24/23   Paz, Marco E, MD  Blood Glucose Calibration (ACCU-CHEK GUIDE CONTROL) LIQD Use as directed 12/24/20   Paz, Marco E, MD  Blood Glucose Monitoring Suppl (ACCU-CHEK GUIDE) w/Device KIT Check blood sugars once daily 09/09/22   Paz, Marco E, MD  Calcium   Carb-Cholecalciferol (CALCIUM  500 + D) 500-125 MG-UNIT TABS Take 1 tablet by mouth daily.    [provider]  Cyanocobalamin  (VITAMIN B-12 PO) Take 2,500 mcg by mouth daily.    [provider]  glucose blood (ACCU-CHEK GUIDE) test strip USE TO CHECK BLOOD SUGAR ONCE  DAILY 10/24/22   Paz, Marco E, MD  levothyroxine  (SYNTHROID ) 100 MCG tablet TAKE 1 TABLET BY MOUTH DAILY  BEFORE BREAKFAST 07/01/23   Paz, Marco E, MD  metFORMIN  (GLUCOPHAGE ) 500 MG tablet Take 1 tablet (500 mg total) by mouth daily with breakfast. 08/24/23   Paz, Marco E, MD  Multiple Vitamin (MULTI-VITAMIN DAILY PO) Take 1 tablet by mouth daily.    [provider]  nitroGLYCERIN  (NITROSTAT ) 0.4 MG SL tablet DISSOLVE 1 TABLET UNDER THE  TONGUE EVERY 5 MINUTES AS NEEDED FOR CHEST PAIN. MAX OF 3 TABLETS IN 15 MINUTES. CALL 911 IF PAIN  PERSISTS. 08/03/23   Paz, Marco E, MD  senna-docusate (SENOKOT-S) 8.6-50 MG tablet Take 1 tablet by mouth at bedtime as needed for moderate constipation. 02/20/23   Marco Hollow, MD  sitaGLIPtin  (JANUVIA ) 50 MG tablet Take 1 tablet (50 mg total) by mouth daily. 02/05/23   Marco Hollow, MD  sodium zirconium cyclosilicate  (LOKELMA ) 10 g PACK packet MIX 1 PACKET IN AT LEAST 3  TABLESPOONS OF WATER STIR AND  DRINK IMMEDIATELY EVERY  OTHER  DAY 12/10/22   Marco Hollow, MD  XTANDI  40 MG tablet Take 120 mg by mouth daily. 07/12/21   [provider]      Allergies    Beef-derived drug products, Chicken allergy, Fish allergy, and Pork-derived products    Review of Systems   Review of Systems  Musculoskeletal:  Positive for arthralgias.  All other systems reviewed and are negative.   Physical Exam Updated Vital Signs Ht 5\' 9"  (1.753 m)   Wt 63.5 kg   BMI 20.67 kg/m   Physical Exam Vitals and nursing note reviewed.  Constitutional:      Appearance: He is well-developed.  HENT:     Head: Normocephalic and atraumatic.  Eyes:     Conjunctiva/sclera: Conjunctivae normal.      Pupils: Pupils are equal, round, and reactive to light.  Cardiovascular:     Rate and Rhythm: Normal rate and regular rhythm.     Heart sounds: Normal heart sounds.  Pulmonary:     Effort: Pulmonary effort is normal.     Breath sounds: Normal breath sounds.  Abdominal:     General: Bowel sounds are normal.     Palpations: Abdomen is soft.  Musculoskeletal:        General: Normal range of motion.     Cervical back: Normal range of motion.     Comments: Tender along left lateral hip, pain radiating into the groin per patient, left leg is externally rotated, DP pulse intact, wiggling toes on command, normal sensation  Skin:    General: Skin is warm and dry.  Neurological:     Mental Status: He is alert and oriented to person, place, and time.     ED Results / Procedures / Treatments   Labs (all labs ordered are listed, but only abnormal results are displayed) Labs Reviewed  CBC WITH DIFFERENTIAL/PLATELET  COMPREHENSIVE METABOLIC PANEL WITH GFR  PROTIME-INR  TYPE AND SCREEN    EKG None  Radiology No results found.  Procedures Procedures  {Document cardiac monitor, telemetry assessment procedure when appropriate:1}  Medications Ordered in ED Medications - No data to display  ED Course/ Medical Decision Making/ A&P   {   Click here for ABCD2, HEART and other calculatorsREFRESH Note before signing :1}                              Medical Decision Making Amount and/or Complexity of Data Reviewed Labs: ordered. Radiology: ordered and independent interpretation performed. ECG/medicine tests: ordered and independent interpretation performed.   ***  11:28 PM Spoke with Dr. Lucienne Ryder-- hopeful for OR in AM.  Keep NPO, will see in AM for formal consultation. Final Clinical Impression(s) / ED Diagnoses Final diagnoses:  None    Rx / DC Orders ED Discharge Orders     None

## 2023-09-27 ENCOUNTER — Inpatient Hospital Stay (HOSPITAL_COMMUNITY)

## 2023-09-27 ENCOUNTER — Other Ambulatory Visit: Payer: Self-pay

## 2023-09-27 ENCOUNTER — Encounter (HOSPITAL_COMMUNITY): Payer: Self-pay | Admitting: Internal Medicine

## 2023-09-27 ENCOUNTER — Encounter (HOSPITAL_COMMUNITY)
Admission: EM | Disposition: A | Discharge status: Skilled Nursing Facility | Payer: Self-pay | Source: Home / Self Care | Attending: Internal Medicine

## 2023-09-27 ENCOUNTER — Inpatient Hospital Stay (HOSPITAL_COMMUNITY): Admitting: Anesthesiology

## 2023-09-27 DIAGNOSIS — E7849 Other hyperlipidemia: Secondary | ICD-10-CM

## 2023-09-27 DIAGNOSIS — R6889 Other general symptoms and signs: Secondary | ICD-10-CM | POA: Diagnosis not present

## 2023-09-27 DIAGNOSIS — N1832 Chronic kidney disease, stage 3b: Secondary | ICD-10-CM

## 2023-09-27 DIAGNOSIS — E039 Hypothyroidism, unspecified: Secondary | ICD-10-CM | POA: Diagnosis not present

## 2023-09-27 DIAGNOSIS — Z7984 Long term (current) use of oral hypoglycemic drugs: Secondary | ICD-10-CM | POA: Diagnosis not present

## 2023-09-27 DIAGNOSIS — E119 Type 2 diabetes mellitus without complications: Secondary | ICD-10-CM | POA: Diagnosis not present

## 2023-09-27 DIAGNOSIS — W010XXA Fall on same level from slipping, tripping and stumbling without subsequent striking against object, initial encounter: Secondary | ICD-10-CM | POA: Diagnosis present

## 2023-09-27 DIAGNOSIS — W19XXXA Unspecified fall, initial encounter: Secondary | ICD-10-CM | POA: Diagnosis not present

## 2023-09-27 DIAGNOSIS — C7951 Secondary malignant neoplasm of bone: Secondary | ICD-10-CM | POA: Diagnosis not present

## 2023-09-27 DIAGNOSIS — M21752 Unequal limb length (acquired), left femur: Secondary | ICD-10-CM | POA: Diagnosis present

## 2023-09-27 DIAGNOSIS — S79929A Unspecified injury of unspecified thigh, initial encounter: Secondary | ICD-10-CM | POA: Diagnosis not present

## 2023-09-27 DIAGNOSIS — Z79899 Other long term (current) drug therapy: Secondary | ICD-10-CM | POA: Diagnosis not present

## 2023-09-27 DIAGNOSIS — I2510 Atherosclerotic heart disease of native coronary artery without angina pectoris: Secondary | ICD-10-CM

## 2023-09-27 DIAGNOSIS — K5900 Constipation, unspecified: Secondary | ICD-10-CM | POA: Diagnosis not present

## 2023-09-27 DIAGNOSIS — S72142A Displaced intertrochanteric fracture of left femur, initial encounter for closed fracture: Secondary | ICD-10-CM | POA: Diagnosis not present

## 2023-09-27 DIAGNOSIS — Z9889 Other specified postprocedural states: Secondary | ICD-10-CM | POA: Diagnosis not present

## 2023-09-27 DIAGNOSIS — Z794 Long term (current) use of insulin: Secondary | ICD-10-CM | POA: Diagnosis not present

## 2023-09-27 DIAGNOSIS — G453 Amaurosis fugax: Secondary | ICD-10-CM | POA: Diagnosis present

## 2023-09-27 DIAGNOSIS — S72142D Displaced intertrochanteric fracture of left femur, subsequent encounter for closed fracture with routine healing: Secondary | ICD-10-CM | POA: Diagnosis not present

## 2023-09-27 DIAGNOSIS — Z7983 Long term (current) use of bisphosphonates: Secondary | ICD-10-CM | POA: Diagnosis not present

## 2023-09-27 DIAGNOSIS — Z8679 Personal history of other diseases of the circulatory system: Secondary | ICD-10-CM | POA: Diagnosis not present

## 2023-09-27 DIAGNOSIS — R2681 Unsteadiness on feet: Secondary | ICD-10-CM | POA: Diagnosis not present

## 2023-09-27 DIAGNOSIS — R609 Edema, unspecified: Secondary | ICD-10-CM | POA: Diagnosis not present

## 2023-09-27 DIAGNOSIS — E875 Hyperkalemia: Secondary | ICD-10-CM | POA: Diagnosis not present

## 2023-09-27 DIAGNOSIS — Z91014 Allergy to mammalian meats: Secondary | ICD-10-CM | POA: Diagnosis not present

## 2023-09-27 DIAGNOSIS — Z800 Family history of malignant neoplasm of digestive organs: Secondary | ICD-10-CM | POA: Diagnosis not present

## 2023-09-27 DIAGNOSIS — D62 Acute posthemorrhagic anemia: Secondary | ICD-10-CM | POA: Diagnosis not present

## 2023-09-27 DIAGNOSIS — Z743 Need for continuous supervision: Secondary | ICD-10-CM | POA: Diagnosis not present

## 2023-09-27 DIAGNOSIS — Z7982 Long term (current) use of aspirin: Secondary | ICD-10-CM | POA: Diagnosis not present

## 2023-09-27 DIAGNOSIS — E038 Other specified hypothyroidism: Secondary | ICD-10-CM

## 2023-09-27 DIAGNOSIS — E785 Hyperlipidemia, unspecified: Secondary | ICD-10-CM | POA: Diagnosis present

## 2023-09-27 DIAGNOSIS — Z79890 Hormone replacement therapy (postmenopausal): Secondary | ICD-10-CM | POA: Diagnosis not present

## 2023-09-27 DIAGNOSIS — E1122 Type 2 diabetes mellitus with diabetic chronic kidney disease: Secondary | ICD-10-CM | POA: Diagnosis not present

## 2023-09-27 DIAGNOSIS — Y92009 Unspecified place in unspecified non-institutional (private) residence as the place of occurrence of the external cause: Secondary | ICD-10-CM | POA: Diagnosis not present

## 2023-09-27 DIAGNOSIS — R600 Localized edema: Secondary | ICD-10-CM | POA: Diagnosis not present

## 2023-09-27 DIAGNOSIS — C61 Malignant neoplasm of prostate: Secondary | ICD-10-CM | POA: Diagnosis present

## 2023-09-27 DIAGNOSIS — N183 Chronic kidney disease, stage 3 unspecified: Secondary | ICD-10-CM | POA: Diagnosis not present

## 2023-09-27 DIAGNOSIS — T40605A Adverse effect of unspecified narcotics, initial encounter: Secondary | ICD-10-CM | POA: Diagnosis not present

## 2023-09-27 DIAGNOSIS — Y9301 Activity, walking, marching and hiking: Secondary | ICD-10-CM | POA: Diagnosis present

## 2023-09-27 DIAGNOSIS — R7989 Other specified abnormal findings of blood chemistry: Secondary | ICD-10-CM | POA: Diagnosis present

## 2023-09-27 DIAGNOSIS — Y92018 Other place in single-family (private) house as the place of occurrence of the external cause: Secondary | ICD-10-CM | POA: Diagnosis not present

## 2023-09-27 DIAGNOSIS — Z91013 Allergy to seafood: Secondary | ICD-10-CM | POA: Diagnosis not present

## 2023-09-27 DIAGNOSIS — K5903 Drug induced constipation: Secondary | ICD-10-CM | POA: Diagnosis not present

## 2023-09-27 DIAGNOSIS — Z96642 Presence of left artificial hip joint: Secondary | ICD-10-CM | POA: Diagnosis not present

## 2023-09-27 DIAGNOSIS — M1712 Unilateral primary osteoarthritis, left knee: Secondary | ICD-10-CM | POA: Diagnosis not present

## 2023-09-27 DIAGNOSIS — Z01818 Encounter for other preprocedural examination: Secondary | ICD-10-CM | POA: Diagnosis not present

## 2023-09-27 DIAGNOSIS — S72002A Fracture of unspecified part of neck of left femur, initial encounter for closed fracture: Secondary | ICD-10-CM | POA: Diagnosis not present

## 2023-09-27 HISTORY — PX: INTRAMEDULLARY (IM) NAIL INTERTROCHANTERIC: SHX5875

## 2023-09-27 LAB — COMPREHENSIVE METABOLIC PANEL WITH GFR
ALT: 12 U/L (ref 0–44)
ALT: 13 U/L (ref 0–44)
AST: 21 U/L (ref 15–41)
AST: 24 U/L (ref 15–41)
Albumin: 3.1 g/dL — ABNORMAL LOW (ref 3.5–5.0)
Albumin: 3.3 g/dL — ABNORMAL LOW (ref 3.5–5.0)
Alkaline Phosphatase: 56 U/L (ref 38–126)
Alkaline Phosphatase: 63 U/L (ref 38–126)
Anion gap: 12 (ref 5–15)
Anion gap: 8 (ref 5–15)
BUN: 22 mg/dL (ref 8–23)
BUN: 23 mg/dL (ref 8–23)
CO2: 25 mmol/L (ref 22–32)
CO2: 27 mmol/L (ref 22–32)
Calcium: 10 mg/dL (ref 8.9–10.3)
Calcium: 9.8 mg/dL (ref 8.9–10.3)
Chloride: 101 mmol/L (ref 98–111)
Chloride: 102 mmol/L (ref 98–111)
Creatinine, Ser: 1.45 mg/dL — ABNORMAL HIGH (ref 0.61–1.24)
Creatinine, Ser: 1.48 mg/dL — ABNORMAL HIGH (ref 0.61–1.24)
GFR, Estimated: 45 mL/min — ABNORMAL LOW (ref >60)
GFR, Estimated: 46 mL/min — ABNORMAL LOW (ref >60)
Glucose, Bld: 184 mg/dL — ABNORMAL HIGH (ref 70–99)
Glucose, Bld: 186 mg/dL — ABNORMAL HIGH (ref 70–99)
Potassium: 4.9 mmol/L (ref 3.5–5.1)
Potassium: 5.6 mmol/L — ABNORMAL HIGH (ref 3.5–5.1)
Sodium: 136 mmol/L (ref 135–145)
Sodium: 139 mmol/L (ref 135–145)
Total Bilirubin: 0.6 mg/dL (ref 0.0–1.2)
Total Bilirubin: 0.7 mg/dL (ref 0.0–1.2)
Total Protein: 6.2 g/dL — ABNORMAL LOW (ref 6.5–8.1)
Total Protein: 6.4 g/dL — ABNORMAL LOW (ref 6.5–8.1)

## 2023-09-27 LAB — CBC WITH DIFFERENTIAL/PLATELET
Abs Immature Granulocytes: 0.05 10*3/uL (ref 0.00–0.07)
Basophils Absolute: 0 10*3/uL (ref 0.0–0.1)
Basophils Relative: 1 %
Eosinophils Absolute: 0.3 10*3/uL (ref 0.0–0.5)
Eosinophils Relative: 4 %
HCT: 32.2 % — ABNORMAL LOW (ref 39.0–52.0)
Hemoglobin: 10.3 g/dL — ABNORMAL LOW (ref 13.0–17.0)
Immature Granulocytes: 1 %
Lymphocytes Relative: 14 %
Lymphs Abs: 1.1 10*3/uL (ref 0.7–4.0)
MCH: 27.7 pg (ref 26.0–34.0)
MCHC: 32 g/dL (ref 30.0–36.0)
MCV: 86.6 fL (ref 80.0–100.0)
Monocytes Absolute: 0.6 10*3/uL (ref 0.1–1.0)
Monocytes Relative: 8 %
Neutro Abs: 5.8 10*3/uL (ref 1.7–7.7)
Neutrophils Relative %: 72 %
Platelets: 212 10*3/uL (ref 150–400)
RBC: 3.72 MIL/uL — ABNORMAL LOW (ref 4.22–5.81)
RDW: 13.4 % (ref 11.5–15.5)
WBC: 7.9 10*3/uL (ref 4.0–10.5)
nRBC: 0 % (ref 0.0–0.2)

## 2023-09-27 LAB — CBC
HCT: 31.8 % — ABNORMAL LOW (ref 39.0–52.0)
Hemoglobin: 10 g/dL — ABNORMAL LOW (ref 13.0–17.0)
MCH: 27.5 pg (ref 26.0–34.0)
MCHC: 31.4 g/dL (ref 30.0–36.0)
MCV: 87.4 fL (ref 80.0–100.0)
Platelets: 201 10*3/uL (ref 150–400)
RBC: 3.64 MIL/uL — ABNORMAL LOW (ref 4.22–5.81)
RDW: 13.4 % (ref 11.5–15.5)
WBC: 7.5 10*3/uL (ref 4.0–10.5)
nRBC: 0 % (ref 0.0–0.2)

## 2023-09-27 LAB — SURGICAL PCR SCREEN
MRSA, PCR: NEGATIVE
Staphylococcus aureus: NEGATIVE

## 2023-09-27 LAB — POTASSIUM: Potassium: 5.3 mmol/L — ABNORMAL HIGH (ref 3.5–5.1)

## 2023-09-27 LAB — GLUCOSE, CAPILLARY
Glucose-Capillary: 156 mg/dL — ABNORMAL HIGH (ref 70–99)
Glucose-Capillary: 173 mg/dL — ABNORMAL HIGH (ref 70–99)
Glucose-Capillary: 254 mg/dL — ABNORMAL HIGH (ref 70–99)

## 2023-09-27 LAB — ABO/RH: ABO/RH(D): O POS

## 2023-09-27 LAB — PROTIME-INR
INR: 1 (ref 0.8–1.2)
Prothrombin Time: 13.2 s (ref 11.4–15.2)

## 2023-09-27 SURGERY — FIXATION, FRACTURE, INTERTROCHANTERIC, WITH INTRAMEDULLARY ROD
Anesthesia: General | Laterality: Left

## 2023-09-27 MED ORDER — VITAMIN B-12 1000 MCG PO TABS: 2500.0000 ug | ORAL_TABLET | Freq: Every day | ORAL | Status: DC

## 2023-09-27 MED ORDER — SODIUM CHLORIDE 0.9 % IV SOLN: 250.0000 mL | INTRAVENOUS | Status: DC | PRN

## 2023-09-27 MED ORDER — SODIUM CHLORIDE 0.9% FLUSH: 3.0000 mL | INTRAVENOUS | Status: DC | PRN

## 2023-09-27 MED ORDER — LIDOCAINE 2% (20 MG/ML) 5 ML SYRINGE
INTRAMUSCULAR | Status: DC | PRN
  Administered 2023-09-27: 80 mg via INTRAVENOUS

## 2023-09-27 MED ORDER — OXYCODONE HCL 5 MG PO TABS
5.0000 mg | ORAL_TABLET | ORAL | Status: DC | PRN
  Administered 2023-09-27: 5 mg via ORAL
  Filled 2023-09-27: qty 1

## 2023-09-27 MED ORDER — HYDROMORPHONE HCL 1 MG/ML IJ SOLN: 0.5000 mg | INTRAMUSCULAR | Status: DC | PRN

## 2023-09-27 MED ORDER — PHENYLEPHRINE HCL-NACL 20-0.9 MG/250ML-% IV SOLN: INTRAVENOUS | Status: DC | PRN

## 2023-09-27 MED ORDER — ALBUMIN HUMAN 5 % IV SOLN: INTRAVENOUS | Status: DC | PRN

## 2023-09-27 MED ORDER — ORAL CARE MOUTH RINSE: 15.0000 mL | Freq: Once | OROMUCOSAL | Status: AC

## 2023-09-27 MED ORDER — PHENYLEPHRINE HCL-NACL 20-0.9 MG/250ML-% IV SOLN
INTRAVENOUS | Status: DC | PRN
  Administered 2023-09-27: 50 ug/min via INTRAVENOUS

## 2023-09-27 MED ORDER — SODIUM CHLORIDE 0.9% FLUSH
3.0000 mL | Freq: Two times a day (BID) | INTRAVENOUS | Status: DC
  Administered 2023-09-27 (×3): 3 mL via INTRAVENOUS

## 2023-09-27 MED ORDER — INSULIN ASPART 100 UNIT/ML IJ SOLN
0.0000 [IU] | Freq: Three times a day (TID) | INTRAMUSCULAR | Status: DC
  Administered 2023-09-28 (×2): 1 [IU] via SUBCUTANEOUS
  Administered 2023-09-29 (×2): 2 [IU] via SUBCUTANEOUS
  Administered 2023-09-29 – 2023-09-30 (×3): 1 [IU] via SUBCUTANEOUS
  Administered 2023-09-30: 2 [IU] via SUBCUTANEOUS

## 2023-09-27 MED ORDER — ONDANSETRON HCL 4 MG/2ML IJ SOLN
INTRAMUSCULAR | Status: DC | PRN
  Administered 2023-09-27: 4 mg via INTRAVENOUS

## 2023-09-27 MED ORDER — FENTANYL CITRATE (PF) 250 MCG/5ML IJ SOLN
INTRAMUSCULAR | Status: AC
  Filled 2023-09-27: qty 5

## 2023-09-27 MED ORDER — INSULIN ASPART 100 UNIT/ML IJ SOLN: 0.0000 [IU] | INTRAMUSCULAR | Status: DC | PRN

## 2023-09-27 MED ORDER — TRANEXAMIC ACID-NACL 1000-0.7 MG/100ML-% IV SOLN
1000.0000 mg | Freq: Once | INTRAVENOUS | Status: AC
  Administered 2023-09-27: 1000 mg via INTRAVENOUS
  Filled 2023-09-27: qty 100

## 2023-09-27 MED ORDER — CHLORHEXIDINE GLUCONATE 0.12 % MT SOLN
15.0000 mL | Freq: Once | OROMUCOSAL | Status: AC
  Administered 2023-09-27: 15 mL via OROMUCOSAL

## 2023-09-27 MED ORDER — FENTANYL CITRATE (PF) 250 MCG/5ML IJ SOLN
INTRAMUSCULAR | Status: DC | PRN
  Administered 2023-09-27: 100 ug via INTRAVENOUS

## 2023-09-27 MED ORDER — OXYCODONE HCL 5 MG PO TABS
5.0000 mg | ORAL_TABLET | ORAL | Status: DC | PRN
  Administered 2023-09-27 – 2023-09-30 (×7): 5 mg via ORAL
  Filled 2023-09-27 (×7): qty 1

## 2023-09-27 MED ORDER — ENZALUTAMIDE 40 MG PO TABS: 120.0000 mg | ORAL_TABLET | Freq: Every day | ORAL | Status: DC

## 2023-09-27 MED ORDER — CEFAZOLIN SODIUM-DEXTROSE 2-4 GM/100ML-% IV SOLN
2.0000 g | Freq: Three times a day (TID) | INTRAVENOUS | Status: AC
  Administered 2023-09-27 – 2023-09-28 (×3): 2 g via INTRAVENOUS
  Filled 2023-09-27 (×3): qty 100

## 2023-09-27 MED ORDER — ROCURONIUM BROMIDE 10 MG/ML (PF) SYRINGE
PREFILLED_SYRINGE | INTRAVENOUS | Status: DC | PRN
  Administered 2023-09-27: 60 mg via INTRAVENOUS

## 2023-09-27 MED ORDER — CHLORHEXIDINE GLUCONATE 0.12 % MT SOLN
OROMUCOSAL | Status: AC
  Filled 2023-09-27: qty 15

## 2023-09-27 MED ORDER — CEFAZOLIN SODIUM-DEXTROSE 2-4 GM/100ML-% IV SOLN
INTRAVENOUS | Status: AC
  Filled 2023-09-27: qty 100

## 2023-09-27 MED ORDER — HYDROMORPHONE HCL 1 MG/ML IJ SOLN
0.5000 mg | INTRAMUSCULAR | Status: DC | PRN
  Filled 2023-09-27: qty 0.5

## 2023-09-27 MED ORDER — FENTANYL CITRATE (PF) 100 MCG/2ML IJ SOLN
INTRAMUSCULAR | Status: AC
  Filled 2023-09-27: qty 2

## 2023-09-27 MED ORDER — LACTATED RINGERS IV BOLUS
1000.0000 mL | INTRAVENOUS | Status: AC
  Administered 2023-09-27: 1000 mL via INTRAVENOUS

## 2023-09-27 MED ORDER — LEVOTHYROXINE SODIUM 100 MCG PO TABS
100.0000 ug | ORAL_TABLET | Freq: Every day | ORAL | Status: DC
  Administered 2023-09-27 – 2023-09-30 (×4): 100 ug via ORAL
  Filled 2023-09-27 (×4): qty 1

## 2023-09-27 MED ORDER — TRANEXAMIC ACID-NACL 1000-0.7 MG/100ML-% IV SOLN
INTRAVENOUS | Status: AC
  Filled 2023-09-27: qty 100

## 2023-09-27 MED ORDER — DEXAMETHASONE SODIUM PHOSPHATE 10 MG/ML IJ SOLN
INTRAMUSCULAR | Status: AC
  Filled 2023-09-27: qty 1

## 2023-09-27 MED ORDER — PROPOFOL 10 MG/ML IV BOLUS
INTRAVENOUS | Status: DC | PRN
  Administered 2023-09-27: 90 mg via INTRAVENOUS

## 2023-09-27 MED ORDER — ONDANSETRON HCL 4 MG/2ML IJ SOLN: 4.0000 mg | Freq: Four times a day (QID) | INTRAMUSCULAR | Status: DC | PRN

## 2023-09-27 MED ORDER — CEFAZOLIN SODIUM-DEXTROSE 2-4 GM/100ML-% IV SOLN: 2.0000 g | INTRAVENOUS | Status: DC

## 2023-09-27 MED ORDER — LIDOCAINE 2% (20 MG/ML) 5 ML SYRINGE
INTRAMUSCULAR | Status: AC
  Filled 2023-09-27: qty 5

## 2023-09-27 MED ORDER — ACETAMINOPHEN 325 MG PO TABS: 650.0000 mg | ORAL_TABLET | Freq: Four times a day (QID) | ORAL | Status: DC | PRN

## 2023-09-27 MED ORDER — ACETAMINOPHEN 500 MG PO TABS
1000.0000 mg | ORAL_TABLET | Freq: Three times a day (TID) | ORAL | Status: DC
  Administered 2023-09-27 – 2023-09-30 (×8): 1000 mg via ORAL
  Filled 2023-09-27 (×9): qty 2

## 2023-09-27 MED ORDER — SUGAMMADEX SODIUM 200 MG/2ML IV SOLN
INTRAVENOUS | Status: DC | PRN
Start: 2023-09-27 — End: 2023-09-27
  Administered 2023-09-27: 127 mg via INTRAVENOUS

## 2023-09-27 MED ORDER — ROCURONIUM BROMIDE 10 MG/ML (PF) SYRINGE
PREFILLED_SYRINGE | INTRAVENOUS | Status: AC
  Filled 2023-09-27: qty 10

## 2023-09-27 MED ORDER — PHENYLEPHRINE 80 MCG/ML (10ML) SYRINGE FOR IV PUSH (FOR BLOOD PRESSURE SUPPORT)
PREFILLED_SYRINGE | INTRAVENOUS | Status: DC | PRN
  Administered 2023-09-27: 240 ug via INTRAVENOUS
  Administered 2023-09-27: 120 ug via INTRAVENOUS

## 2023-09-27 MED ORDER — TRANEXAMIC ACID-NACL 1000-0.7 MG/100ML-% IV SOLN
1000.0000 mg | INTRAVENOUS | Status: AC
  Administered 2023-09-27: 1000 mg via INTRAVENOUS

## 2023-09-27 MED ORDER — FENTANYL CITRATE (PF) 100 MCG/2ML IJ SOLN
25.0000 ug | INTRAMUSCULAR | Status: DC | PRN
  Administered 2023-09-27 (×3): 50 ug via INTRAVENOUS

## 2023-09-27 MED ORDER — DEXAMETHASONE SODIUM PHOSPHATE 10 MG/ML IJ SOLN
INTRAMUSCULAR | Status: DC | PRN
  Administered 2023-09-27: 10 mg via INTRAVENOUS

## 2023-09-27 MED ORDER — PHENYLEPHRINE 80 MCG/ML (10ML) SYRINGE FOR IV PUSH (FOR BLOOD PRESSURE SUPPORT)
PREFILLED_SYRINGE | INTRAVENOUS | Status: AC
  Filled 2023-09-27: qty 10

## 2023-09-27 MED ORDER — 0.9 % SODIUM CHLORIDE (POUR BTL) OPTIME
TOPICAL | Status: DC | PRN
  Administered 2023-09-27: 1000 mL

## 2023-09-27 MED ORDER — INSULIN ASPART 100 UNIT/ML IJ SOLN
0.0000 [IU] | Freq: Every day | INTRAMUSCULAR | Status: DC
  Administered 2023-09-27: 3 [IU] via SUBCUTANEOUS
  Administered 2023-09-29: 2 [IU] via SUBCUTANEOUS

## 2023-09-27 MED ORDER — ACETAMINOPHEN 650 MG RE SUPP: 650.0000 mg | Freq: Four times a day (QID) | RECTAL | Status: DC | PRN

## 2023-09-27 MED ORDER — PROPOFOL 10 MG/ML IV BOLUS
INTRAVENOUS | Status: AC
  Filled 2023-09-27: qty 20

## 2023-09-27 MED ORDER — LACTATED RINGERS IV SOLN: INTRAVENOUS | Status: DC

## 2023-09-27 MED ORDER — LACTATED RINGERS IV BOLUS: 1000.0000 mL | Freq: Once | INTRAVENOUS | Status: DC

## 2023-09-27 MED ORDER — ATORVASTATIN CALCIUM 40 MG PO TABS: 40.0000 mg | ORAL_TABLET | Freq: Every day | ORAL | Status: DC

## 2023-09-27 MED ORDER — SUCCINYLCHOLINE CHLORIDE 200 MG/10ML IV SOSY
PREFILLED_SYRINGE | INTRAVENOUS | Status: AC
  Filled 2023-09-27: qty 10

## 2023-09-27 MED ORDER — ONDANSETRON HCL 4 MG PO TABS
4.0000 mg | ORAL_TABLET | Freq: Four times a day (QID) | ORAL | Status: DC | PRN
Start: 2023-09-27 — End: 2023-10-01

## 2023-09-27 MED ORDER — ACETAMINOPHEN 10 MG/ML IV SOLN
INTRAVENOUS | Status: DC | PRN
  Administered 2023-09-27: 1000 mg via INTRAVENOUS

## 2023-09-27 MED ORDER — ACETAMINOPHEN 10 MG/ML IV SOLN
INTRAVENOUS | Status: AC
  Filled 2023-09-27: qty 100

## 2023-09-27 MED ORDER — ONDANSETRON HCL 4 MG/2ML IJ SOLN
INTRAMUSCULAR | Status: AC
  Filled 2023-09-27: qty 2

## 2023-09-27 MED ORDER — ASPIRIN 81 MG PO CHEW
81.0000 mg | CHEWABLE_TABLET | Freq: Two times a day (BID) | ORAL | Status: DC
  Administered 2023-09-27 – 2023-09-30 (×6): 81 mg via ORAL
  Filled 2023-09-27 (×6): qty 1

## 2023-09-27 MED ORDER — SENNOSIDES-DOCUSATE SODIUM 8.6-50 MG PO TABS: 1.0000 | ORAL_TABLET | Freq: Every evening | ORAL | Status: DC | PRN

## 2023-09-27 MED ORDER — ONDANSETRON HCL 4 MG/2ML IJ SOLN: 4.0000 mg | Freq: Once | INTRAMUSCULAR | Status: DC | PRN

## 2023-09-27 MED ORDER — CEFAZOLIN SODIUM-DEXTROSE 2-3 GM-%(50ML) IV SOLR
INTRAVENOUS | Status: DC | PRN
  Administered 2023-09-27: 2 g via INTRAVENOUS

## 2023-09-27 SURGICAL SUPPLY — 35 items
BIT DRILL INTERTAN LAG SCREW (BIT) IMPLANT
BIT DRILL SHORT 4.0 (BIT) IMPLANT
BNDG COHESIVE 6X5 TAN NS LF (GAUZE/BANDAGES/DRESSINGS) ×1 IMPLANT
CANISTER SUCTION 3000ML PPV (SUCTIONS) ×1 IMPLANT
COVER PERINEAL POST (MISCELLANEOUS) ×1 IMPLANT
COVER SURGICAL LIGHT HANDLE (MISCELLANEOUS) ×1 IMPLANT
DRAPE C-ARM 42X72 X-RAY (DRAPES) ×1 IMPLANT
DRAPE STERI IOBAN 125X83 (DRAPES) ×1 IMPLANT
DRESSING MEPILEX FLEX 4X4 (GAUZE/BANDAGES/DRESSINGS) IMPLANT
DRSG MEPILEX POST OP 4X8 (GAUZE/BANDAGES/DRESSINGS) IMPLANT
DRSG TEGADERM 4X4.75 (GAUZE/BANDAGES/DRESSINGS) IMPLANT
DURAPREP 26ML APPLICATOR (WOUND CARE) ×1 IMPLANT
ELECTRODE REM PT RTRN 9FT ADLT (ELECTROSURGICAL) ×1 IMPLANT
GAUZE PAD ABD 8X10 STRL (GAUZE/BANDAGES/DRESSINGS) ×1 IMPLANT
GAUZE SPONGE 4X4 12PLY STRL (GAUZE/BANDAGES/DRESSINGS) ×1 IMPLANT
GAUZE XEROFORM 1X8 LF (GAUZE/BANDAGES/DRESSINGS) ×1 IMPLANT
GAUZE XEROFORM 5X9 LF (GAUZE/BANDAGES/DRESSINGS) IMPLANT
GLOVE BIOGEL PI IND STRL 8 (GLOVE) ×1 IMPLANT
GLOVE PI ORTHO PRO STRL 7.5 (GLOVE) ×1 IMPLANT
GOWN STRL REUS W/ TWL XL LVL3 (GOWN DISPOSABLE) ×1 IMPLANT
KIT BASIN OR (CUSTOM PROCEDURE TRAY) ×1 IMPLANT
KIT TURNOVER KIT B (KITS) ×1 IMPLANT
MANIFOLD NEPTUNE II (INSTRUMENTS) ×1 IMPLANT
NAIL TRIGEN 10MMX40CM-125 LEFT (Nail) IMPLANT
NS IRRIG 1000ML POUR BTL (IV SOLUTION) ×1 IMPLANT
PACK GENERAL/GYN (CUSTOM PROCEDURE TRAY) ×1 IMPLANT
PAD ARMBOARD POSITIONER FOAM (MISCELLANEOUS) ×1 IMPLANT
PIN GUIDE 3.2X343MM (PIN) IMPLANT
ROD GUIDE 3.0 (MISCELLANEOUS) IMPLANT
SCREW LAG COMPR KIT 95/90 (Screw) IMPLANT
SCREW TRIGEN LOW PROF 5.0X30 (Screw) IMPLANT
SCREW TRIGEN LOW PROF 5.0X32.5 (Screw) IMPLANT
SUT VIC AB 0 CT1 18XCR BRD 8 (SUTURE) ×1 IMPLANT
SUT VIC AB 2-0 CT1 18 (SUTURE) ×1 IMPLANT
WATER STERILE IRR 1000ML POUR (IV SOLUTION) ×1 IMPLANT

## 2023-09-27 NOTE — Plan of Care (Signed)
   Problem: Coping: Goal: Level of anxiety will decrease Outcome: Progressing   Problem: Pain Managment: Goal: General experience of comfort will improve and/or be controlled Outcome: Progressing   Problem: Safety: Goal: Ability to remain free from injury will improve Outcome: Progressing

## 2023-09-27 NOTE — H&P (Addendum)
 History and Physical    Marco [name] RUE:[id number] DOB: 06-Feb-1934 DOA: 09/26/2023  PCP: Ezell Hollow, MD   Patient coming from: Home   Chief Complaint:  Chief Complaint  Patient presents with   Fall   ED TRIAGE note:      Arrives [facility] from home after a mechanical fall. Tripped while walking and landed on carpet flooring.    Left hip pain with subjective shortening and outward rotation.    EMS admin 50mcg fentanyl enroute to ED.         HPI:  Marco [name] is a 89 y.o. male with medical history significant of CAD, non-insulin -dependent DM type II, prostatic cancer with bony metastasis,  chronic sacral insufficiency fracture, CKD stage III and postherpetic neuralgia presented to emergency department complaining of mechanical fall and since the fall patient has noticing left-sided hip pain, limb shortening.  Patient states he had just walked to the kitchen to take his medicine and was walking back to the couch when he tripped and landed on his left hip on carpet.  There was no head injury or loss of consciousness.  Unable to get up off the floor and EMS was called.  Found to have some shortening and external rotation.  Denies any prior left hip injuries or surgeries.  He is not currently on anticoagulation.  Patient reported history of diagnosed with prostate cancer with bony mets, due to start chemotherapy soon.  Patient denies any chest pain, palpitation, shortness of breath, headache and blurry vision.  Denies any presyncope/syncope.  No other complaint at this time.   ED Course:  At presentation to ED patient is hemodynamically stable. CBC showing normal WBC count, stable H&H and normal platelet count. CMP unremarkable creatinine and GFR at baseline.  Normal calcium  level. Normal pro time INR.  Chest x-ray no acute cardiopulmonary pulmonary. X-ray of the hip showed acute mildly displaced intertrochanteric fracture of the proximal left femur.  In the ED patient has  been seen by orthopedic surgery recommended medicine admission and clearance for surgery.  Patient has been referred to oncology last seen in the clinic 09/14/2023 for metastatic prostate cancer  Hospitalist has been consulted for admission for management of hip fracture.  Called on-call oncology Dr. Klae for further recommendation however unsuccessful attempt to reach out to him over phone.  Need to call oncology in the daytime regarding further recommendation if it is safe to proceed with surgery with metastatic prostate cancer with metastasis to bone.   Significant labs in the ED: Lab Orders         Surgical pcr screen         CBC with Differential         Comprehensive metabolic panel         Protime-INR         Comprehensive metabolic panel         CBC       Review of Systems:  Review of Systems  Constitutional:  Negative for chills, fever, malaise/fatigue and weight loss.  Respiratory:  Negative for cough, sputum production and shortness of breath.   Cardiovascular:  Negative for chest pain, palpitations, orthopnea and claudication.  Gastrointestinal:  Negative for heartburn and nausea.  Musculoskeletal:  Positive for falls and joint pain. Negative for back pain, myalgias and neck pain.  Neurological:  Negative for dizziness and headaches.  Psychiatric/Behavioral:  The patient is not nervous/anxious.     Past Medical History:  Diagnosis Date  Amaurosis fugax 2017   Diabetes mellitus    DJD (degenerative joint disease) 08/25/2013   Elevated PSA    Other and unspecified hyperlipidemia 04/25/2013   Prostate cancer [facility])     Past Surgical History:  Procedure Laterality Date   CATARACT EXTRACTION     Bilaterally   TYMPANIC MEMBRANE REPAIR     R     reports that he has never smoked. He has never used smokeless tobacco. He reports that he does not drink alcohol  and does not use drugs.  Allergies  Allergen Reactions   Beef-Derived Drug Products     vegetarian    Chicken Allergy    Fish Allergy    Pork-Derived Products     Family History  Problem Relation Age of Onset   Other Mother        Unknown    Other Father        Unknown   Colon cancer Nephew 35   Diabetes Neg Hx    CAD Neg Hx     Prior to Admission medications   Medication Sig Start Date End Date Taking? Authorizing Provider  alendronate  (FOSAMAX ) 70 MG tablet Take 1 tablet (70 mg total) by mouth every 7 (seven) days. Take with a full glass of water on an empty stomach. 10/29/22  Yes Paz, Anitra Ket, MD  atorvastatin  (LIPITOR) 40 MG tablet Take 1 tablet (40 mg total) by mouth at bedtime. 08/24/23  Yes Paz, Anitra Ket, MD  Calcium  Carb-Cholecalciferol (CALCIUM  500 + D) 500-125 MG-UNIT TABS Take 1 tablet by mouth daily.   Yes [provider]  Cyanocobalamin  (VITAMIN B-12 PO) Take 2,500 mcg by mouth daily.   Yes [provider]  levothyroxine  (SYNTHROID ) 100 MCG tablet TAKE 1 TABLET BY MOUTH DAILY  BEFORE BREAKFAST 07/01/23  Yes Paz, Jose E, MD  metFORMIN  (GLUCOPHAGE ) 500 MG tablet Take 1 tablet (500 mg total) by mouth daily with breakfast. 08/24/23  Yes Paz, Jose E, MD  Multiple Vitamin (MULTI-VITAMIN DAILY PO) Take 1 tablet by mouth daily.   Yes [provider]  sodium zirconium cyclosilicate  (LOKELMA ) 10 g PACK packet MIX 1 PACKET IN AT LEAST 3  TABLESPOONS OF WATER STIR AND  DRINK IMMEDIATELY EVERY OTHER  DAY 12/10/22  Yes Paz, Jose E, MD  XTANDI  40 MG tablet Take 120 mg by mouth daily. 07/12/21  Yes [provider]  Accu-Chek Softclix Lancets lancets CHECK BLOOD SUGAR ONCE DAILY AS  DIRECTED 10/24/22   Ezell Hollow, MD  Alcohol  Swabs  PADS Use to clean finger prior to testing blood sugars 03/13/21   Ezell Hollow, MD  aspirin  EC 81 MG tablet Take 81 mg by mouth daily. Swallow whole. Patient not taking: Reported on 09/27/2023    [provider]  Blood Glucose Calibration (ACCU-CHEK GUIDE CONTROL) LIQD Use as directed 12/24/20   Paz, Jose E, MD  Blood Glucose  Monitoring Suppl (ACCU-CHEK GUIDE) w/Device KIT Check blood sugars once daily 09/09/22   Paz, Jose E, MD  glucose blood (ACCU-CHEK GUIDE) test strip USE TO CHECK BLOOD SUGAR ONCE  DAILY 10/24/22   Paz, Jose E, MD  nitroGLYCERIN  (NITROSTAT ) 0.4 MG SL tablet DISSOLVE 1 TABLET UNDER THE  TONGUE EVERY 5 MINUTES AS NEEDED FOR CHEST PAIN. MAX OF 3 TABLETS IN 15 MINUTES. CALL 911 IF PAIN  PERSISTS. 08/03/23   Paz, Jose E, MD  senna-docusate (SENOKOT-S) 8.6-50 MG tablet Take 1 tablet by mouth at bedtime as needed for moderate constipation. 02/20/23  Ezell Hollow, MD  sitaGLIPtin  (JANUVIA ) 50 MG tablet Take 1 tablet (50 mg total) by mouth daily. 02/05/23   Ezell Hollow, MD     Physical Exam: Vitals:   09/27/23 0115 09/27/23 0130 09/27/23 0217 09/27/23 0406  BP: (!) 139/90 (!) 143/89 (!) 150/71 (!) 113/49  Pulse: 78 79 66 83  Resp: 18 17 18 16   Temp:   97.6 F (36.4 C) (!) 97.5 F (36.4 C)  TempSrc:   Oral   SpO2: 99% 100% 99% 100%  Weight:      Height:        Physical Exam Vitals and nursing note reviewed.  Constitutional:      General: He is not in acute distress.    Appearance: He is not ill-appearing.  HENT:     Mouth/Throat:     Mouth: Mucous membranes are moist.  Eyes:     Pupils: Pupils are equal, round, and reactive to light.  Cardiovascular:     Rate and Rhythm: Normal rate and regular rhythm.     Pulses: Normal pulses.     Heart sounds: Normal heart sounds. No murmur heard. Pulmonary:     Effort: Pulmonary effort is normal.  Abdominal:     Palpations: Abdomen is soft.  Musculoskeletal:        General: Tenderness and deformity present.     Cervical back: Neck supple.     Right lower leg: No edema.     Left lower leg: No edema.  Neurological:     Mental Status: He is alert and oriented to person, place, and time.     Motor: No weakness.  Psychiatric:        Mood and Affect: Mood normal.      Labs on Admission: I have personally reviewed following labs and imaging  studies  CBC: Recent Labs  Lab 09/27/23 0000  WBC 7.9  NEUTROABS 5.8  HGB 10.3*  HCT 32.2*  MCV 86.6  PLT 212   Basic Metabolic Panel: Recent Labs  Lab 09/27/23 0000  NA 139  K 4.9  CL 102  CO2 25  GLUCOSE 184*  BUN 23  CREATININE 1.45*  CALCIUM  10.0   GFR: Estimated Creatinine Clearance: 31.6 mL/min (A) (by C-G formula based on SCr of 1.45 mg/dL (H)). Liver Function Tests: Recent Labs  Lab 09/27/23 0000  AST 24  ALT 12  ALKPHOS 63  BILITOT 0.7  PROT 6.4*  ALBUMIN 3.3*   No results for input(s): "LIPASE", "AMYLASE" in the last 168 hours. No results for input(s): "AMMONIA" in the last 168 hours. Coagulation Profile: Recent Labs  Lab 09/27/23 0000  INR 1.0   Cardiac Enzymes: No results for input(s): "CKTOTAL", "CKMB", "CKMBINDEX", "TROPONINI", "TROPONINIHS" in the last 168 hours. BNP (last 3 results) No results for input(s): "BNP" in the last 8760 hours. HbA1C: No results for input(s): "HGBA1C" in the last 72 hours. CBG: No results for input(s): "GLUCAP" in the last 168 hours. Lipid Profile: No results for input(s): "CHOL", "HDL", "LDLCALC", "TRIG", "CHOLHDL", "LDLDIRECT" in the last 72 hours. Thyroid  Function Tests: No results for input(s): "TSH", "T4TOTAL", "FREET4", "T3FREE", "THYROIDAB" in the last 72 hours. Anemia Panel: No results for input(s): "VITAMINB12", "FOLATE", "FERRITIN", "TIBC", "IRON", "RETICCTPCT" in the last 72 hours. Urine analysis:    Component Value Date/Time   COLORURINE YELLOW 02/11/2022 1923   APPEARANCEUR CLEAR 02/11/2022 1923   LABSPEC 1.008 02/11/2022 1923   PHURINE 6.0 02/11/2022 1923   GLUCOSEU NEGATIVE 02/11/2022 1923  GLUCOSEU NEGATIVE 07/24/2016 0908   HGBUR NEGATIVE 02/11/2022 1923   BILIRUBINUR NEGATIVE 02/11/2022 1923   KETONESUR NEGATIVE 02/11/2022 1923   PROTEINUR NEGATIVE 02/11/2022 1923   UROBILINOGEN 0.2 07/24/2016 0908   NITRITE NEGATIVE 02/11/2022 1923   LEUKOCYTESUR NEGATIVE 02/11/2022 1923     Radiological Exams on Admission: I have personally reviewed images DG HIP UNILAT W OR W/O PELVIS 2-3 VIEWS LEFT Result Date: 09/26/2023 CLINICAL DATA:  Status post fall. EXAM: DG HIP (WITH OR WITHOUT PELVIS) 2-3V LEFT COMPARISON:  None Available. FINDINGS: An acute mildly displaced fracture deformity is seen extending through the inter trochanteric region of the proximal left femur. There is no evidence of dislocation. A radiopaque intramedullary rod and compression screw device are seen within the proximal right femur. Mild degenerative changes are seen in the form of joint space narrowing and acetabular sclerosis. Additional degenerative changes are present within the visualized portion of the lower lumbar spine. IMPRESSION: Acute mildly displaced intertrochanteric fracture of the proximal left femur. Electronically Signed   By: Virgle Grime M.D.   On: 09/26/2023 23:12   DG Chest 1 View Result Date: 09/26/2023 CLINICAL DATA:  Status post fall. EXAM: CHEST  1 VIEW COMPARISON:  February 11, 2022 FINDINGS: The heart size and mediastinal contours are within normal limits. There is moderate severity calcification of the aortic arch. Low lung volumes are noted. Mild atelectatic changes seen within the bilateral lung bases. There is no evidence of acute infiltrate, pleural effusion or pneumothorax. Multilevel degenerative changes seen throughout the thoracic spine. IMPRESSION: Low lung volumes without acute cardiopulmonary disease. Electronically Signed   By: Virgle Grime M.D.   On: 09/26/2023 23:10     EKG: My personal interpretation of EKG shows: EKG showing normal sinus rhythm heart rate 84    Assessment/Plan: Principal Problem:   Intertrochanteric fracture of left femur (HCC) Active Problems:   Prostate cancer metastatic to bone (HCC)   Non-insulin  dependent type 2 diabetes mellitus (HCC)   Hyperlipidemia   Hypothyroidism   CKD (chronic kidney disease), stage III (HCC)   Fall at  home, initial encounter   History of CAD (coronary artery disease)    Assessment and Plan: Left-sided intertrochanteric fracture Fall at home -Patient presenting emergency department with complaining of mechanical fall.  Patient denies hitting of the head.  And loss of consciousness. Unfortunately patient has history of metastatic prostate cancer with metastasis to bone and oncology Dr. Davene Ernst planning to start chemotherapy very soon.  At the baseline patient has very brittle bone and on top of that patient is on androgen deprivation therapy. - Presentation to ED patient is hemodynamically stable.  Chest x-ray unremarkable.  X-ray hip showed acute mildly displaced intertrochanteric fracture of the proximal left femur. -CBC unremarkable stable H&H 10.3 and 32.  Normal WBC platelet count. - Normal pro time INR. - CMP elevated creatinine 1.45 and GFR 46 renal function at baseline. - In the ED patient has been evaluated by orthopedic surgeon Dr. Lucienne Ryder plan for surgical intervention for stabilization of the fracture in order to allow mobilization better.  Recommended keep patient n.p.o. and anticipated surgery in the daytime and also recommended medicine service admission for anticipation for surgical clearance. -Tried to reach out to on-call oncology Dr.Kale for further discussion risk versus benefit for surgery and further surgical clearance.  Unsuccessful attempt to reach on-call oncology. -Based on patient's past medical history revised cardiac index for preoperative risk score is 1 which indicates 6% risk for major cardiac event for  noncardiac surgery. Based on my assessment patient can proceed with hip repair surgery. - Continue maintenance fluid LR 100 cc/h. - Continue optimal pain control. -Need to consult PT and OT after surgical repair of the hip fracture.   History of metastatic prostate cancer to bone -History of prostate cancer on Xtandi  120 mg daily. - Recent PET scan in April 2025  showed bony metastasis to pelvis, spine and rib. -Patient follows urology Dr. Freddi Jaeger and oncology Dr.Cari outpatient.  Non-insulin  insulin -dependent DM type II -At home patient is on metformin .  Given patient is n.p.o. holding metformin .   Hyperlipidemia Resume Lipitor postoperatively.  Hypothyroidism -Continue levothyroxine  100 mcg daily  CKD stage IIIb -Creatinine and GFR at baseline.  Continue monitor renal function.  History of CAD -NM stress test 2018 showing 54% with basal wall hypokinesis. -At home patient is not on beta-blocker or dual antiplatelet therapy. - Will resume Lipitor postoperatively and start aspirin  81 mg daily.   DVT prophylaxis:  SCDs.  Can start pharmacological DVT prophylaxis per surgery Code Status:  Full Code Diet: npo Family Communication:   Family was present at bedside, at the time of interview. Opportunity was given to ask question and all questions were answered satisfactorily.  Disposition Plan: Pending hip surgery Consults: Orthopedic surgery and oncology Admission status:   Inpatient, Telemetry bed  Severity of Illness: The appropriate patient status for this patient is INPATIENT. Inpatient status is judged to be reasonable and necessary in order to provide the required intensity of service to ensure the patient's safety. The patient's presenting symptoms, physical exam findings, and initial radiographic and laboratory data in the context of their chronic comorbidities is felt to place them at high risk for further clinical deterioration. Furthermore, it is not anticipated that the patient will be medically stable for discharge from the hospital within 2 midnights of admission.   * I certify that at the point of admission it is my clinical judgment that the patient will require inpatient hospital care spanning beyond 2 midnights from the point of admission due to high intensity of service, high risk for further deterioration and high frequency of  surveillance required.Aaron Aas    Lowell Mcgurk, MD [facility]  How to contact the [facility] Attending or Consulting provider 7A - 7P or covering provider during after hours 7P -7A, for this patient.  Check the care team in [facility] and look for a) attending/consulting [facility] provider listed and b) the [facility] team listed Log into [email or web address] and use 's universal password to access. If you do not have the password, please contact the hospital operator. Locate the [facility] provider you are looking for under [facility] and page to a number that you can be directly reached. If you still have difficulty reaching the provider, please page the [facility] (Director on Call) for the Hospitalists listed on amion for assistance.  09/27/2023, 5:44 AM

## 2023-09-27 NOTE — Progress Notes (Signed)
 PHARMACY NOTE:  ANTIMICROBIAL RENAL DOSAGE ADJUSTMENT  Current antimicrobial regimen includes a mismatch between antimicrobial dosage and estimated renal function.  As per policy approved by the Pharmacy & Therapeutics and Medical Executive Committees, the antimicrobial dosage will be adjusted accordingly.  Current antimicrobial dosage:  Cefazolin 2g IV every 6 hours x3 doses post op  Indication: surgical prophylaxis  Renal Function:Estimated Creatinine Clearance: 31 mL/min (A) (by C-G formula based on SCr of 1.48 mg/dL (H)). []      On intermittent HD, scheduled: []      On CRRT    Antimicrobial dosage has been changed to:  Cefazolin 2g IV every 8 hours x 3 doses post op  Additional comments:   Thank you for allowing pharmacy to be a part of this patient's care.  Alisa Irish, RPh Clinical Pharmacist 09/27/2023 3:41 PM

## 2023-09-27 NOTE — Anesthesia Postprocedure Evaluation (Signed)
 Anesthesia Post Note  Patient: Marco [name]  Procedure(s) Performed: FIXATION, FRACTURE, INTERTROCHANTERIC, WITH INTRAMEDULLARY ROD (Left)     Patient location during evaluation: PACU Anesthesia Type: General Level of consciousness: awake and alert Pain management: pain level controlled Vital Signs Assessment: post-procedure vital signs reviewed and stable Respiratory status: spontaneous breathing, nonlabored ventilation and respiratory function stable Cardiovascular status: blood pressure returned to baseline and stable Postop Assessment: no apparent nausea or vomiting Anesthetic complications: no   There were no known notable events for this encounter.  Last Vitals:  Vitals:   09/27/23 1300 09/27/23 1539  BP: (!) (P) 95/58 131/78  Pulse: 80 96  Resp: (P) 16 20  Temp:  (!) 36.4 C  SpO2: (P) 92% 100%    Last Pain:  Vitals:   09/27/23 1300  TempSrc:   PainSc: (P) Asleep                 Erin Havers

## 2023-09-27 NOTE — Op Note (Signed)
 Orthopedic Surgery Operative Report   Procedure: Left intertrochanteric fracture intramedullary rodding   Modifier: none   Date of procedure: 09/27/2023   Patient name: Marco [name]   MRN: [id number]  DOB: April 08, 1934   Surgeon: Colette Davies, MD Assistant: None Pre-operative diagnosis: left intertrochanteric femur fracture Post-operative diagnosis: same as above Findings: left intertrochanteric femur fracture   Specimens: none Anesthesia: general EBL: 100cc Complications: none Pre-incision antibiotic: ancef TXA was given prior to incision as well   Implants:  Implant Name Type Inv. Item Serial No. Manufacturer Lot No. LRB No. Used Action  NAIL TRIGEN [id number] LEFT - [id number] Nail NAIL TRIGEN [id number] LEFT  [employer] [id number] Left 1 Implanted  SCREW LAG COMPR KIT 95/90 - [id number] Screw SCREW LAG COMPR KIT 95/90  [employer] [id number] Left 1 Implanted  SCREW TRIGEN LOW PROF 5.0X30 - [id number] Screw SCREW TRIGEN LOW PROF 5.0X30  [employer] [id number] Left 1 Implanted  SCREW TRIGEN LOW PROF 5.0X32.5 - [id number] Screw SCREW TRIGEN LOW PROF 5.0X32.5  [employer] [id number] Left 1 Implanted       Indication for procedure: Patient is a 89 y.o. male who presented to the ER after a ground level fall. The patient had left hip pain and x-rays revealed an intertrochanteric femur fracture. The patient was admitted to a medicine service with orthopedics consulted. I met the patient and discussed the fracture. I recommended operative management in the form of intramedullary rodding to stabilize the fracture and allow for mobilization. Explained the risks of this procedure included, but were not limited to: nonunion, malunion, fixation failure, infection, bleeding, stiffness, need for additional procedures, deep vein thrombosis, pulmonary embolism, MI, arrhythmia, and death. The alternatives of this surgery would  be to treat the fracture with immobilization or to perform no intervention. After our discussion, patient elected to proceed with surgery.    Procedure Description: The patient was met in the pre-operative holding area. The patient's identity and consent were verified. The operative site was marked by myself. The patient's remaining questions about the surgery were answered. The patient was brought back to the operating room. General anesthesia was induced and an endotracheal tube was placed by the anesthesia staff. The patient was transferred to the [facility] table. All bony prominences were well padded. Traction was applied and an attempt at closed reduction with the Hana table was made. Fluoroscopy confirmed a satisfactory reduction with slight valgus alignment. The surgical area was cleansed with alcohol . Ancef and TXA were administered by anesthesia. The patient's skin was then prepped and draped in a standard, sterile fashion. A time out was performed that identified the patient, the procedure, and the operative site. All team members agreed with what was stated in the time out.    An incision was made just proximal and inferior to the greater trochanter. The incision was taken sharply down through the fascia. A guide pin was inserted into the wound onto the top of the greater trochanter. Fluoroscopy was used to place the guide pin at the starting point at the tip of the greater trochanter and in line with the middle of the femoral neck. The wire was then advanced to a point just past the lesser trochanter. A soft tissue sleeve was advanced over the wire onto the greater trochanter. An entry reamer was used to open the proximal femoral canal under fluoroscopic guidance. The pin and reamer were removed. A long guide wire was placed down the femoral canal.  It was advanced to the superior aspect of the patella under fluoroscopy. The length of the nail was estimated off of the guide wire. The nail measure about 42  so a 40 was selected. A 9mm reamer was inserted over the guidewire and used to ream the femoral canal. It was advanced down past the isthmus under fluoroscopic guidance. The canal was serially reamed with increasing sized reamers until a 11mm reamer at which point there was chatter. A 10mm nail was selected. The nail was advanced over the guidewire. It was advanced until the lag screws were estimated to end in the center of the femoral head. The guide wire was removed.    An incision was made sharply through the skin, dermis, and fascia over the lateral thigh in the area where the lag screws would be inserted. The lag screw inserter was placed through the jig onto the lateral femoral cortex. A guide wire was advanced through the lag screw inserted into the femoral head under fluoroscopic guidance. It was found to be in acceptable position on the AP and lateral views. The length of the lag screw was estimated off of the guide wire. A 95mm screw was selected. The inferior lag screw was drilled through the guide. The derotation device was placed through the inserter. The proximal lag screw hole was then drilled over the guide wire. The screw was inserted over the wire under fluoroscopic guidance. The derotation bar was removed and the inferior lag screw was inserted.    The C arm was then brought to the knee in a lateral position to obtain perfect circles at the distal interlocking holes. An incision was made over the distal interlocking holes on the lateral aspect of the femur. This incision was taken down through fascia. A drill was inserted into the wound and placed over the interlocking hole using perfect circle technique. The hole was then drilled bicortically. A depth gauge was used to estimate the screw length. A 30mm screw was selected for the more proximal hole. This was inserted and there was good purchase. The same process was then repeated to insert a 32.12mm screw in the more distal hole.    Final AP  and lateral fluoroscopic images were then taken of the hip, femur, and knee showing satisfactory reduction and placement of the cephalomedullary rod. The wounds were copiously irrigated with sterile saline. The fascia was closed with 0 vicryl. The deep dermal layer was closed with 2-0 vicryl. The skin was closed with staples. Dressings were applied. All counts were correct at the end of the case. Patient was transferred back to a hospital bed. The patient was awakened from anesthesia and brought back to the post-anesthesia care unit in stable condition.     Post-operative plan: The patient will recover in the post-anesthesia care unit and then go to the floor on the medicine service. The patient will receive two post-operative doses of ancef. He will get another dose of TXA. The patient will be weight bearing as tolerated. The patient will work with physical therapy. Patient was set to start chemotherapy in the coming weeks. Will hold off on starting any chemotherapy until his wounds have healed. The patient's disposition will be determined by the medicine service.        Colette Davies, MD Orthopedic Surgeon

## 2023-09-27 NOTE — Discharge Instructions (Addendum)
 Orthopedic Surgery Discharge Instructions  Patient name: Marco [name] Fracture: left intertrochanteric femur fracture Procedure Performed: left hip cephalomedullary nail Date of Surgery: 09/27/2023 Surgeon: Colette Davies, MD  Activity: You are allowed to put as much weight on your leg as you would like. You can walk as much as you would like. You can perform household activities such as cleaning dishes, doing laundry, vacuuming, etc.  Incision Care: Your incision site has a dressing over it. That dressing should remain in place and dry at all times for a total of one week after surgery. After one week, you can remove the dressing. Underneath the dressing, you will find skin staples. You should leave these staples in place. They will be taken out in the office when the wound has healed. Do not pick, rub, or scrub at them. Do not put cream or lotion over the surgical area. After one week and once the dressing is off, it is okay to let soap and water run over your incision. Again, do not pick, scrub, or rub at the staples when bathing. Do not submerge (e.g., take a bath, swim, go in a hot tub, etc.) until six weeks after surgery. There may be some bloody drainage from the incision into the dressing after surgery. This is normal. You do not need to replace the dressing. Continue to leave it in place for the one week as instructed above. Should the dressing become saturated with blood or drainage, please call the office for further instructions.   Medications: You have been prescribed oxycodone . This is a narcotic pain medication and should only be taken as prescribed. You should not drink alcohol  or operate heavy machinery (including driving) while taking this medication. The oxycodone  can cause constipation as a side effect. For that reason, you have been prescribed senna and miralax . These are both laxatives. You do not need to take this medication if you develop diarrhea. Should you remain constipated  even while taking the senna and miralax , please use the miralax  twice daily. Tylenol  has been prescribed to be taken every 8 hours, which will give you additional pain relief.   You have been prescribed aspirin  as a blood thinner. This medication is to be taken to prevent blood clots. Take 81 milligrams twice daily. You should refrain from using other blood thinners (warfarin, apixaban, plavix, xarelto, etc.) while using the aspirin . You will need to take this medication for a total of 6 weeks after your surgery.   You should not use over-the-counter NSAIDs (ibuprofen, Aleve, Celebrex, naproxen, meloxicam, etc.) for pain relief because aspirin  is a similar medication. There can be side effects including but not limited to kidney injury and ulcers if you take these type of medications with the aspirin .  In order to set expectations for opioid prescriptions, you will only be prescribed opioids for a total of six weeks after surgery and, at two-weeks after surgery, your opioid prescription will start to tapered (decreased dosage and number of pills). If you have ongoing need for opioid medication six weeks after surgery, you will be referred to pain management. If you are already established with a provider that is giving you opioid medications, you should schedule an appointment with them for six weeks after surgery if you feel you are going to need another prescription. State law only allows for opioid prescriptions one week at a time. If you are running out of opioid medication near the end of the week, please call the office during business hours before  running out so I can send you another prescription.   Diet: You are safe to resume your regular diet after surgery.   Reasons to Call the Office After Surgery: You should feel free to call the office with any concerns or questions you have in the post-operative period, but you should definitely notify the office if you develop: -shortness of breath, chest  pain, or trouble breathing -excessive bleeding, drainage, redness, or swelling around the surgical site -fevers, chills, or pain that is getting worse with each passing day -persistent nausea or vomiting -new weakness in the left lower extremity, new or worsening numbness or tingling in the left lower extremity -other concerns about your surgery  Follow Up Appointments: You have a follow up appointment scheduled with Dr. Sulema Endo on 10/14/2023 at 10:15am. Please arrive on time to this appointment. The office location and phone number are listed below.   Office Information:  -Colette Davies, MD -Phone number: [phone] -Address: [address]       [place], [place] [address]

## 2023-09-27 NOTE — Progress Notes (Signed)
 [facility] PROGRESS NOTE   Marco [name] WGN:[id number] DOB: 16-Oct-1933 DOA: 09/26/2023  PCP: Ezell Hollow, MD  Brief History: 89 y.o. male with medical history significant of CAD, non-insulin -dependent DM type II, prostatic cancer with bony metastasis,  chronic sacral insufficiency fracture, CKD stage III and postherpetic neuralgia presented to emergency department complaining of mechanical fall and since the fall patient has noticing left-sided hip pain, limb shortening.  Patient was found to have hip fracture.  He was hospitalized for further management.    Consultants: Orthopedics  Procedures: Plan is for surgical intervention today    Subjective/Interval History: Patient complains of pain in the left hip area.  6 out of 10 in intensity.  Denies any chest pain shortness of breath.      Assessment/Plan:  Left hip fracture Secondary to mechanical fall.  Patient seen by orthopedics.  Plan is for surgical intervention today.  Pain medications.  PT and OT subsequently.  DVT prophylaxis to be initiated after surgery.  Metastatic prostate cancer Has been followed by urology.  Has been on Xtandi .  Recent PET scan in April showed bony metastases to pelvis spine and ribs.  Apparently has been referred to medical oncology but has not been seen by them yet.  Diabetes mellitus type 2 Monitor CBGs.  Patient is on metformin  prior to admission.  Hyperlipidemia On statin prior to admission  Hypothyroidism Continue levothyroxine .  Chronic kidney disease stage IIIb Stable  Hyperkalemia Potassium level was normal last night at 4.9.  Noted to be 5.6 this morning.  He has not received any potassium supplements.  Will recheck potassium level this afternoon.  Coronary artery disease Stable.  Normocytic anemia No evidence of overt bleeding.  Monitor daily for now.  DVT Prophylaxis: Definitive prophylaxis after surgery. Code Status: Full code Family Communication: Son was at  bedside Disposition Plan: Probably will need to go to skilled nursing facility for short-term rehab.  Patient sustained injury to the other leg 2 years ago and required rehab at that time.  Status is: Inpatient Remains inpatient appropriate because: Hip fracture      Medications: Scheduled:  enzalutamide   120 mg Oral Daily   levothyroxine   100 mcg Oral Q0600   sodium chloride  flush  3 mL Intravenous Q12H   Continuous:  sodium chloride       ceFAZolin (ANCEF) IV     lactated ringers  100 mL/hr at 09/27/23 0230   tranexamic acid     PRN:sodium chloride , acetaminophen  **OR** acetaminophen , HYDROmorphone  (DILAUDID ) injection, ondansetron  **OR** ondansetron  (ZOFRAN ) IV, oxyCODONE , senna-docusate, sodium chloride  flush  Antibiotics: Anti-infectives (From admission, onward)    Start     Dose/Rate Route Frequency Ordered Stop   09/27/23 1000  ceFAZolin (ANCEF) IVPB 2g/100 mL premix        2 g 200 mL/hr over 30 Minutes Intravenous To Surgery 09/27/23 0754 09/28/23 1000       Objective:  Vital Signs  Vitals:   09/27/23 0130 09/27/23 0217 09/27/23 0406 09/27/23 0751  BP: (!) 143/89 (!) 150/71 (!) 113/49 (!) 143/82  Pulse: 79 66 83 76  Resp: 17 18 16 18   Temp:  97.6 F (36.4 C) (!) 97.5 F (36.4 C) 97.6 F (36.4 C)  TempSrc:  Oral    SpO2: 100% 99% 100% 100%  Weight:      Height:        Intake/Output Summary (Last 24 hours) at 09/27/2023 0908 Last data filed at 09/27/2023 0700 Gross per 24 hour  Intake 0  ml  Output 400 ml  Net -400 ml   Filed Weights   09/26/23 2243  Weight: 63.5 kg    General appearance: Awake alert.  In no distress Resp: Clear to auscultation bilaterally.  Normal effort Cardio: S1-S2 is normal regular.  No S3-S4.  No rubs murmurs or bruit GI: Abdomen is soft.  Nontender nondistended.  Bowel sounds are present normal.  No masses organomegaly Extremities: Left leg is externally rotated. No obvious focal neurological deficits.   Lab  Results:  Data Reviewed: I have personally reviewed following labs and reports of the imaging studies  CBC: Recent Labs  Lab 09/27/23 0000  WBC 7.9  NEUTROABS 5.8  HGB 10.3*  HCT 32.2*  MCV 86.6  PLT 212    Basic Metabolic Panel: Recent Labs  Lab 09/27/23 0000 09/27/23 0518  NA 139 136  K 4.9 5.6*  CL 102 101  CO2 25 27  GLUCOSE 184* 186*  BUN 23 22  CREATININE 1.45* 1.48*  CALCIUM  10.0 9.8    GFR: Estimated Creatinine Clearance: 31 mL/min (A) (by C-G formula based on SCr of 1.48 mg/dL (H)).  Liver Function Tests: Recent Labs  Lab 09/27/23 0000 09/27/23 0518  AST 24 21  ALT 12 13  ALKPHOS 63 56  BILITOT 0.7 0.6  PROT 6.4* 6.2*  ALBUMIN 3.3* 3.1*     Coagulation Profile: Recent Labs  Lab 09/27/23 0000  INR 1.0     Recent Results (from the past 240 hours)  Surgical pcr screen     Status: None   Collection Time: 09/27/23  5:32 AM   Specimen: Nasal Mucosa; Nasal Swab  Result Value Ref Range Status   MRSA, PCR NEGATIVE NEGATIVE Final   Staphylococcus aureus NEGATIVE NEGATIVE Final    Comment: (NOTE) The Xpert SA Assay (FDA approved for NASAL specimens in patients 98 years of age and older), is one component of a comprehensive surveillance program. It is not intended to diagnose infection nor to guide or monitor treatment. Performed at [facility], [address], [place], [place] [address]       Radiology Studies: DG HIP UNILAT W OR W/O PELVIS 2-3 VIEWS LEFT Result Date: 09/26/2023 CLINICAL DATA:  Status post fall. EXAM: DG HIP (WITH OR WITHOUT PELVIS) 2-3V LEFT COMPARISON:  None Available. FINDINGS: An acute mildly displaced fracture deformity is seen extending through the inter trochanteric region of the proximal left femur. There is no evidence of dislocation. A radiopaque intramedullary rod and compression screw device are seen within the proximal right femur. Mild degenerative changes are seen in the form of joint space narrowing and  acetabular sclerosis. Additional degenerative changes are present within the visualized portion of the lower lumbar spine. IMPRESSION: Acute mildly displaced intertrochanteric fracture of the proximal left femur. Electronically Signed   By: Virgle Grime M.D.   On: 09/26/2023 23:12   DG Chest 1 View Result Date: 09/26/2023 CLINICAL DATA:  Status post fall. EXAM: CHEST  1 VIEW COMPARISON:  February 11, 2022 FINDINGS: The heart size and mediastinal contours are within normal limits. There is moderate severity calcification of the aortic arch. Low lung volumes are noted. Mild atelectatic changes seen within the bilateral lung bases. There is no evidence of acute infiltrate, pleural effusion or pneumothorax. Multilevel degenerative changes seen throughout the thoracic spine. IMPRESSION: Low lung volumes without acute cardiopulmonary disease. Electronically Signed   By: Virgle Grime M.D.   On: 09/26/2023 23:10       LOS:  0 days   [employer]  [facility] Pager on [email or web address]  09/27/2023, 9:08 AM

## 2023-09-27 NOTE — Brief Op Note (Signed)
 09/27/2023  12:23 PM  PATIENT:  Marco [name]  89 y.o. male  PRE-OPERATIVE DIAGNOSIS:  Left hip fracture  POST-OPERATIVE DIAGNOSIS:  Left hip fracture  PROCEDURE:  Procedure(s): FIXATION, FRACTURE, INTERTROCHANTERIC, WITH INTRAMEDULLARY ROD (Left)  SURGEON:  Surgeons and Role:    * Diedra Fowler, MD - Primary  PHYSICIAN ASSISTANT: none  ASSISTANTS: none   ANESTHESIA:   general  EBL:  100 mL   BLOOD ADMINISTERED:none  DRAINS: none   LOCAL MEDICATIONS USED:  NONE  SPECIMEN:  No Specimen  DISPOSITION OF SPECIMEN:  N/A  COUNTS:  YES  TOURNIQUET: NONE  DICTATION: .Note written in EPIC  PLAN OF CARE: Admit to inpatient   PATIENT DISPOSITION:  PACU - hemodynamically stable.   Delay start of Pharmacological VTE agent (>24hrs) due to surgical blood loss or risk of bleeding: no

## 2023-09-27 NOTE — Consult Note (Signed)
 Orthopedic Surgery Consult Note  Assessment: Patient is a 89 y.o. male with left intertrochanteric femur fracture   Plan: -Planning for operative fixation today -Diet: NPO for procedure -DVT ppx: aspirin  81mg  BID post-operatively -Ancef and TXA on call to OR -Weight bearing status: NWB LLE -PT evaluate and treat post-operatively -Pain control -Dispo: pending completion of operative plans   Discussed recommendation for operative intervention in the form of left intertrochanteric femur fracture open reduction internal fixation with cephalomedullary rod. Explained the risks of this procedure included, but were not limited to: nonunion, malunion, hardware failure, infection, bleeding, stiffness, screw cut out, need for additional procedures, deep vein thrombosis, pulmonary embolism, and death. The benefits of this procedure would be to promote fracture healing by providing stability and to allow for early mobilization. The alternatives of this surgery would be to treat the fracture with immobilization in traction or to do no intervention. The patient's questions were answered to his and his son's satisfaction. After this discussion, patient elected to proceed with surgery. Informed consent was obtained.   ___________________________________________________________________________   Reason for consult: left intertrochanteric femur fracture  History:  Patient is a 89 y.o. male who had a ground level fall yesterday at his home. He landed on his left side. He was unable to ambulate afterwards. He was brought to [facility] ER and was found to have a left intertrochanteric femur fracture. He was admitted to medicine and orthopedics was consulted. He is reporting left hip pain this morning. No pain elsewhere. Denies paresthesias and numbness.   Review of systems: General: denies fevers and chills, myalgias Neurologic: denies recent changes in vision, slurred speech Abdomen: denies nausea,  vomiting, hematemesis Respiratory: denies cough, shortness of breath  Past medical history:  DM (last A1c was 7.4 on 07/10/2023) Metastatic prostate cancer Amaurosis fugax HLD  Allergies: NKDA   Past surgical history:  Right tympanic membrane repair Right intertrochanteric femur fracture intramedullary rodding Cataract surgery   Social history: Denies use of nicotine-containing products (cigarettes, vaping, smokeless, etc.) Alcohol  use: denies Denies use of recreational drugs  Family history: -reviewed and not pertinent to intertrochanteric femur fracture   Physical Exam:  BMI of 20.7  General: no acute distress, appears stated age Neurologic: alert, answering questions appropriately, following commands Cardiovascular: regular rate, no cyanosis Respiratory: unlabored breathing on room air, symmetric chest rise Psychiatric: appropriate affect, normal cadence to speech  MSK:   -Bilateral upper extremities  No tenderness to palpation over extremity, no gross deformity, no open wounds Fires deltoid, biceps, triceps, wrist extensors, wrist flexors, finger extensors, finger flexors  AIN/PIN/IO intact  Palpable radial pulse  Sensation intact to light touch in median/ulnar/radial/axillary nerve distributions  Hand warm and well perfused  -Right lower extremity  No tenderness to palpation over extremity, no gross deformity, no open wounds. No pain with log roll Fires hip flexors, quadriceps, hamstrings, tibialis anterior, gastrocnemius and soleus, extensor hallucis longus Plantarflexes and dorsiflexes toes Sensation intact to light touch in sural, saphenous, tibial, deep peroneal, and superficial peroneal nerve distributions Foot warm and well perfused, palpable DP pulse  -Left lower extremity  No tenderness to palpation over extremity, except over the hip. Pain with log roll. Leg shorter when compared to contralateral side. No open wounds Fires quadriceps, hamstrings,  tibialis anterior, gastrocnemius and soleus, extensor hallucis longus. Does not fire hip flexors due to pain Plantarflexes and dorsiflexes toes Sensation intact to light touch in sural, saphenous, tibial, deep peroneal, and superficial peroneal nerve distributions Foot warm and well  perfused, palpable DP pulse  Imaging: XRs of the left femur from 09/26/2023 was independently reviewed and interpreted, showing a displaced and shortened intertrochanteric femur fracture. Fracture in varus alignment. No other fractures seen. No dislocation seen.    Patient name: Marco [name] Patient MRN: [id number] Date: 09/27/23

## 2023-09-27 NOTE — Plan of Care (Signed)
  Problem: Education: Goal: Knowledge of General Education information will improve Description: Including pain rating scale, medication(s)/side effects and non-pharmacologic comfort measures Outcome: Progressing   Problem: Activity: Goal: Risk for activity intolerance will decrease Outcome: Not Progressing   Problem: Nutrition: Goal: Adequate nutrition will be maintained Outcome: Progressing   Problem: Elimination: Goal: Will not experience complications related to bowel motility Outcome: Progressing   Problem: Elimination: Goal: Will not experience complications related to urinary retention Outcome: Progressing   Problem: Pain Managment: Goal: General experience of comfort will improve and/or be controlled Outcome: Progressing

## 2023-09-27 NOTE — Anesthesia Procedure Notes (Signed)
 Procedure Name: Intubation Date/Time: 09/27/2023 10:33 AM  Performed by: Samul Croft, CRNAPre-anesthesia Checklist: Patient identified, Emergency Drugs available, Suction available and Patient being monitored Patient Re-evaluated:Patient Re-evaluated prior to induction Oxygen Delivery Method: Circle System Utilized Preoxygenation: Pre-oxygenation with 100% oxygen Induction Type: IV induction Ventilation: Mask ventilation without difficulty Laryngoscope Size: Mac and 3 Grade View: Grade I Tube type: Oral Tube size: 7.5 mm Number of attempts: 1 Airway Equipment and Method: Stylet and Oral airway Placement Confirmation: ETT inserted through vocal cords under direct vision, positive ETCO2 and breath sounds checked- equal and bilateral Secured at: 21 cm Tube secured with: Tape Dental Injury: Teeth and Oropharynx as per pre-operative assessment

## 2023-09-27 NOTE — Anesthesia Preprocedure Evaluation (Addendum)
 Anesthesia Evaluation  Patient identified by MRN, date of birth, ID band Patient awake    Reviewed: Allergy & Precautions, NPO status , Patient's Chart, lab work & pertinent test results  Airway Mallampati: III  TM Distance: >3 FB Neck ROM: Full    Dental  (+) Dental Advisory Given, Edentulous Upper, Edentulous Lower   Pulmonary neg pulmonary ROS   Pulmonary exam normal breath sounds clear to auscultation       Cardiovascular + CAD  Normal cardiovascular exam Rhythm:Regular Rate:Normal     Neuro/Psych negative neurological ROS     GI/Hepatic negative GI ROS, Neg liver ROS,,,  Endo/Other  diabetes, Type 2, Oral Hypoglycemic AgentsHypothyroidism    Renal/GU Renal InsufficiencyRenal disease     Musculoskeletal  (+) Arthritis ,  Left hip fracture   Abdominal   Peds  Hematology  (+) Blood dyscrasia, anemia   Anesthesia Other Findings Day of surgery medications reviewed with the patient.  Reproductive/Obstetrics                             Anesthesia Physical Anesthesia Plan  ASA: 3  Anesthesia Plan: General   Post-op Pain Management: Tylenol  PO (pre-op)*   Induction: Intravenous  PONV Risk Score and Plan: 2 and Dexamethasone  and Ondansetron   Airway Management Planned: Oral ETT  Additional Equipment:   Intra-op Plan:   Post-operative Plan: Extubation in OR  Informed Consent: I have reviewed the patients History and Physical, chart, labs and discussed the procedure including the risks, benefits and alternatives for the proposed anesthesia with the patient or authorized representative who has indicated his/her understanding and acceptance.     Dental advisory given  Plan Discussed with: CRNA  Anesthesia Plan Comments:         Anesthesia Quick Evaluation

## 2023-09-27 NOTE — Transfer of Care (Signed)
 Immediate Anesthesia Transfer of Care Note  Patient: Marco [name]  Procedure(s) Performed: FIXATION, FRACTURE, INTERTROCHANTERIC, WITH INTRAMEDULLARY ROD (Left)  Patient Location: PACU  Anesthesia Type:General  Level of Consciousness: awake and alert   Airway & Oxygen Therapy: Patient Spontanous Breathing and Patient connected to nasal cannula oxygen  Post-op Assessment: Report given to RN and Post -op Vital signs reviewed and stable  Post vital signs: Reviewed and stable  Last Vitals:  Vitals Value Taken Time  BP 106/71   Temp 97.8   Pulse 90   Resp 12   SpO2 96     Last Pain:  Vitals:   09/27/23 0954  TempSrc: Oral  PainSc:       Patients Stated Pain Goal: 2 (09/27/23 0217)  Complications: There were no known notable events for this encounter.

## 2023-09-28 DIAGNOSIS — C61 Malignant neoplasm of prostate: Secondary | ICD-10-CM

## 2023-09-28 DIAGNOSIS — E119 Type 2 diabetes mellitus without complications: Secondary | ICD-10-CM | POA: Diagnosis not present

## 2023-09-28 DIAGNOSIS — E039 Hypothyroidism, unspecified: Secondary | ICD-10-CM

## 2023-09-28 DIAGNOSIS — D62 Acute posthemorrhagic anemia: Secondary | ICD-10-CM

## 2023-09-28 DIAGNOSIS — C7951 Secondary malignant neoplasm of bone: Secondary | ICD-10-CM

## 2023-09-28 LAB — BASIC METABOLIC PANEL WITH GFR
Anion gap: 5 (ref 5–15)
BUN: 20 mg/dL (ref 8–23)
CO2: 28 mmol/L (ref 22–32)
Calcium: 8.8 mg/dL — ABNORMAL LOW (ref 8.9–10.3)
Chloride: 103 mmol/L (ref 98–111)
Creatinine, Ser: 1.5 mg/dL — ABNORMAL HIGH (ref 0.61–1.24)
GFR, Estimated: 45 mL/min — ABNORMAL LOW (ref >60)
Glucose, Bld: 147 mg/dL — ABNORMAL HIGH (ref 70–99)
Potassium: 4.7 mmol/L (ref 3.5–5.1)
Sodium: 136 mmol/L (ref 135–145)

## 2023-09-28 LAB — GLUCOSE, CAPILLARY
Glucose-Capillary: 133 mg/dL — ABNORMAL HIGH (ref 70–99)
Glucose-Capillary: 177 mg/dL — ABNORMAL HIGH (ref 70–99)
Glucose-Capillary: 187 mg/dL — ABNORMAL HIGH (ref 70–99)
Glucose-Capillary: 178 mg/dL — ABNORMAL HIGH (ref 70–99)

## 2023-09-28 LAB — VITAMIN B12: Vitamin B-12: 1860 pg/mL — ABNORMAL HIGH (ref 180–914)

## 2023-09-28 LAB — CBC
HCT: 22.9 % — ABNORMAL LOW (ref 39.0–52.0)
Hemoglobin: 7.3 g/dL — ABNORMAL LOW (ref 13.0–17.0)
MCH: 27.1 pg (ref 26.0–34.0)
MCHC: 31.9 g/dL (ref 30.0–36.0)
MCV: 85.1 fL (ref 80.0–100.0)
Platelets: 139 10*3/uL — ABNORMAL LOW (ref 150–400)
RBC: 2.69 MIL/uL — ABNORMAL LOW (ref 4.22–5.81)
RDW: 13.5 % (ref 11.5–15.5)
WBC: 6.8 10*3/uL (ref 4.0–10.5)
nRBC: 0 % (ref 0.0–0.2)

## 2023-09-28 LAB — FERRITIN: Ferritin: 43 ng/mL (ref 24–336)

## 2023-09-28 LAB — RETICULOCYTES
Immature Retic Fract: 19.7 % — ABNORMAL HIGH (ref 2.3–15.9)
RBC.: 2.69 MIL/uL — ABNORMAL LOW (ref 4.22–5.81)
Retic Count, Absolute: 53.5 10*3/uL (ref 19.0–186.0)
Retic Ct Pct: 2 % (ref 0.4–3.1)

## 2023-09-28 LAB — IRON AND TIBC
Iron: 45 ug/dL (ref 45–182)
Saturation Ratios: 13 % — ABNORMAL LOW (ref 17.9–39.5)
TIBC: 336 ug/dL (ref 250–450)
UIBC: 291 ug/dL

## 2023-09-28 LAB — HEMOGLOBIN AND HEMATOCRIT, BLOOD
HCT: 30.4 % — ABNORMAL LOW (ref 39.0–52.0)
Hemoglobin: 10.1 g/dL — ABNORMAL LOW (ref 13.0–17.0)

## 2023-09-28 LAB — PREPARE RBC (CROSSMATCH)

## 2023-09-28 LAB — FOLATE: Folate: 15.8 ng/mL (ref >5.9)

## 2023-09-28 MED ORDER — FUROSEMIDE 10 MG/ML IJ SOLN
20.0000 mg | Freq: Once | INTRAMUSCULAR | Status: AC
  Administered 2023-09-28: 20 mg via INTRAVENOUS
  Filled 2023-09-28: qty 2

## 2023-09-28 MED ORDER — METHOCARBAMOL 500 MG PO TABS
500.0000 mg | ORAL_TABLET | Freq: Three times a day (TID) | ORAL | Status: DC | PRN
  Administered 2023-09-28 – 2023-09-30 (×3): 500 mg via ORAL
  Filled 2023-09-28 (×3): qty 1

## 2023-09-28 MED ORDER — SODIUM CHLORIDE 0.9% IV SOLUTION: Freq: Once | INTRAVENOUS | Status: AC

## 2023-09-28 NOTE — Plan of Care (Signed)

## 2023-09-28 NOTE — Plan of Care (Signed)
   Problem: Activity: Goal: Risk for activity intolerance will decrease Outcome: Progressing   Problem: Nutrition: Goal: Adequate nutrition will be maintained Outcome: Progressing   Problem: Pain Managment: Goal: General experience of comfort will improve and/or be controlled Outcome: Progressing

## 2023-09-28 NOTE — Progress Notes (Signed)
 Orthopedic Surgery Progress Note   Assessment: Patient is a 89 y.o. male with left intertrochanteric femur fracture s/p CMN   Plan: -Operative plans: complete -Diet: regular -DVT ppx: aspirin  81mg  BID -Antibiotics: ancef  x2 post-op doses -Weight bearing status: as tolerated  -PT evaluate and treat -Pain control -Would hold off on any chemotherapy until patient's surgical wounds have healed -Dispo: per primary  ___________________________________________________________________________  Subjective: No acute events overnight. Pain well controlled. Has not worked with PT yet. Denies paresthesias and numbness.    Physical Exam:  General: no acute distress, appears stated age Neurologic: alert, answering questions appropriately, following commands Respiratory: unlabored breathing on room air, symmetric chest rise Psychiatric: appropriate affect, normal cadence to speech  MSK:   -Left lower extremity  Dressings over hip c/d/i EHL/TA/GSC intact Plantarflexes and dorsiflexes toes Sensation intact to light touch in sural, saphenous, tibial, deep peroneal, and superficial peroneal nerve distributions Foot warm and well perfused, palpable DP pulse   Patient name: Marco [name] Patient MRN: [id number] Date: 09/28/23

## 2023-09-28 NOTE — Progress Notes (Signed)
 [facility] PROGRESS NOTE   Marco [name] XLK:[id number] DOB: [birth year] DOA: 09/26/2023  PCP: Ezell Hollow, MD  Brief History: 89 y.o. male with medical history significant of CAD, non-insulin -dependent DM type II, prostatic cancer with bony metastasis,  chronic sacral insufficiency fracture, CKD stage III and postherpetic neuralgia presented to emergency department complaining of mechanical fall and since the fall patient has noticing left-sided hip pain, limb shortening.  Patient was found to have hip fracture.  He was hospitalized for further management.    Consultants: Orthopedics  Procedures: ORIF left hip    Subjective/Interval History: Complains of significant pain in the left hip area.  Unable to get comfortable in the bed.  Son is at the bedside.  No chest pain shortness of breath reported by patient.    Assessment/Plan:  Left hip fracture Secondary to mechanical fall.  Patient seen by orthopedics.  Underwent surgery.  PT and OT eval.  Will likely need to go to skilled nursing facility for short-term rehab. Started on aspirin  twice a day by orthopedics for DVT prophylaxis.    Acute blood loss anemia Drop in hemoglobin noted.  Most likely due to fracture and operative loss. Patient with history of CAD.  Will benefit from blood transfusion.  Will order 2 units of PRBC with Lasix in between.  Discussed with son as well who agrees. Anemia panel shows ferritin of 43, iron 45, TIBC 336, percent saturation 13.  Vitamin B12 level 1860.  Folic acid 15.8.  Metastatic prostate cancer Has been followed by urology.  Has been on Xtandi .  Recent PET scan in April showed bony metastases to pelvis spine and ribs.  Apparently has been referred to medical oncology but has not been seen by them yet.  Diabetes mellitus type 2 Monitor CBGs.  Patient is on metformin  prior to admission.  Continue SSI.  Hyperlipidemia On statin prior to admission  Hypothyroidism Continue  levothyroxine .  Chronic kidney disease stage IIIb Stable  Hyperkalemia Potassium level noted to be normal this morning.  Patient mentions that he was prescribed Lokelma  by his PCP for elevated potassium level.  Does not take it daily but takes it every other day.  Since potassium is normal today we will hold off on Lokelma .  Check labs daily.    Coronary artery disease Stable.  DVT Prophylaxis: Aspirin  twice a day Code Status: Full code Family Communication: Son was at bedside Disposition Plan: Probably will need to go to skilled nursing facility for short-term rehab.  Patient sustained injury to the other leg 2 years ago and required rehab at that time.     Medications: Scheduled:  acetaminophen   1,000 mg Oral Q8H   aspirin   81 mg Oral BID   enzalutamide   120 mg Oral Daily   insulin  aspart  0-5 Units Subcutaneous QHS   insulin  aspart  0-6 Units Subcutaneous TID WC   levothyroxine   100 mcg Oral Q0600   Continuous:   ceFAZolin (ANCEF) IV 2 g (09/28/23 0658)   PRN:HYDROmorphone  (DILAUDID ) injection, methocarbamol , ondansetron  **OR** ondansetron  (ZOFRAN ) IV, oxyCODONE , senna-docusate  Antibiotics: Anti-infectives (From admission, onward)    Start     Dose/Rate Route Frequency Ordered Stop   09/27/23 1800  ceFAZolin (ANCEF) IVPB 2g/100 mL premix        2 g 200 mL/hr over 30 Minutes Intravenous Every 8 hours 09/27/23 1325 09/28/23 2159   09/27/23 1000  ceFAZolin (ANCEF) IVPB 2g/100 mL premix  Status:  Discontinued  2 g 200 mL/hr over 30 Minutes Intravenous To Surgery 09/27/23 0754 09/27/23 1325       Objective:  Vital Signs  Vitals:   09/27/23 1935 09/27/23 2341 09/28/23 0413 09/28/23 0723  BP: 117/66 (!) 105/49 (!) 117/52 105/66  Pulse: 94 89 87 97  Resp: 16 16 16 16   Temp: 98.3 F (36.8 C) 98.8 F (37.1 C) 98.3 F (36.8 C) (!) 97.4 F (36.3 C)  TempSrc:   Oral   SpO2: 100% 99% 98% 98%  Weight:      Height:        Intake/Output Summary (Last 24  hours) at 09/28/2023 1013 Last data filed at 09/28/2023 0700 Gross per 24 hour  Intake 1984.06 ml  Output 700 ml  Net 1284.06 ml   Filed Weights   09/26/23 2243  Weight: 63.5 kg    General appearance: Awake alert.  In no distress Resp: Clear to auscultation bilaterally.  Normal effort Cardio: S1-S2 is normal regular.  No S3-S4.  No rubs murmurs or bruit GI: Abdomen is soft.  Nontender nondistended.  Bowel sounds are present normal.  No masses organomegaly    Lab Results:  Data Reviewed: I have personally reviewed following labs and reports of the imaging studies  CBC: Recent Labs  Lab 09/27/23 0000 09/27/23 0518 09/28/23 0602  WBC 7.9 7.5 6.8  NEUTROABS 5.8  --   --   HGB 10.3* 10.0* 7.3*  HCT 32.2* 31.8* 22.9*  MCV 86.6 87.4 85.1  PLT 212 201 139*    Basic Metabolic Panel: Recent Labs  Lab 09/27/23 0000 09/27/23 0518 09/27/23 1433 09/28/23 0602  NA 139 136  --  136  K 4.9 5.6* 5.3* 4.7  CL 102 101  --  103  CO2 25 27  --  28  GLUCOSE 184* 186*  --  147*  BUN 23 22  --  20  CREATININE 1.45* 1.48*  --  1.50*  CALCIUM  10.0 9.8  --  8.8*    GFR: Estimated Creatinine Clearance: 30.6 mL/min (A) (by C-G formula based on SCr of 1.5 mg/dL (H)).  Liver Function Tests: Recent Labs  Lab 09/27/23 0000 09/27/23 0518  AST 24 21  ALT 12 13  ALKPHOS 63 56  BILITOT 0.7 0.6  PROT 6.4* 6.2*  ALBUMIN 3.3* 3.1*     Coagulation Profile: Recent Labs  Lab 09/27/23 0000  INR 1.0     Recent Results (from the past 240 hours)  Surgical pcr screen     Status: None   Collection Time: 09/27/23  5:32 AM   Specimen: Nasal Mucosa; Nasal Swab  Result Value Ref Range Status   MRSA, PCR NEGATIVE NEGATIVE Final   Staphylococcus aureus NEGATIVE NEGATIVE Final    Comment: (NOTE) The Xpert SA Assay (FDA approved for NASAL specimens in patients 19 years of age and older), is one component of a comprehensive surveillance program. It is not intended to diagnose infection  nor to guide or monitor treatment. Performed at [facility], [address], [place], [place] [address]       Radiology Studies: DG HIP UNILAT WITH PELVIS 1V LEFT Result Date: 09/27/2023 CLINICAL DATA:  Postop. EXAM: DG HIP (WITH OR WITHOUT PELVIS) 1V*L* COMPARISON:  Radiograph earlier today FINDINGS: Frogleg lateral view requested by the referring provider. Femoral intramedullary nail and trans trochanteric screw fixation of intertrochanteric femur fracture. Mild residual fracture displacement. No change in alignment from earlier today. Recent postsurgical change includes air and edema in  the soft tissues with lateral skin staples in place. IMPRESSION: ORIF of intertrochanteric femur fracture. No change in alignment from earlier today. Electronically Signed   By: Chadwick Colonel M.D.   On: 09/27/2023 16:00   DG FEMUR MIN 2 VIEWS LEFT Result Date: 09/27/2023 CLINICAL DATA:  Postop. EXAM: LEFT FEMUR 2 VIEWS COMPARISON:  Preoperative imaging FINDINGS: Femoral intramedullary nail with trans trochanteric and distal locking screw fixation traverse proximal femur fracture. Improved fracture alignment from preoperative imaging. Recent postsurgical change includes air and edema in the soft tissues with multiple skin staples in place. IMPRESSION: ORIF of proximal femur fracture. Electronically Signed   By: Chadwick Colonel M.D.   On: 09/27/2023 15:28   DG FEMUR MIN 2 VIEWS LEFT Result Date: 09/27/2023 CLINICAL DATA:  [id number] Surgery, elective [id number] EXAM: LEFT FEMUR 2 VIEWS COMPARISON:  Sep 27, 2023 FINDINGS: Spot fluoroscopy images were obtained for surgical planning purposes. This demonstrates placement of an intramedullary rod. There is persistent displacement of the lesser trochanter. Time: 1 minute 47 seconds Dose: 15.85 mGy Please reference procedure report for further details. IMPRESSION: Spot fluoroscopy images for surgical planning purposes. Electronically Signed   By: Clancy Crimes M.D.    On: 09/27/2023 12:19   DG C-Arm 1-60 Min-No Report Result Date: 09/27/2023 Fluoroscopy was utilized by the requesting physician.  No radiographic interpretation.   DG C-Arm 1-60 Min-No Report Result Date: 09/27/2023 Fluoroscopy was utilized by the requesting physician.  No radiographic interpretation.   DG FEMUR MIN 2 VIEWS LEFT Result Date: 09/27/2023 CLINICAL DATA:  Preop.  Pain. EXAM: LEFT FEMUR 2 VIEWS COMPARISON:  09/26/2023. FINDINGS: Intertrochanteric comminuted fracture proximal left femur. Lesser trochanter displaced several cm. Distal femur is intact. Marked tricompartmental degenerative changes noted at the left knee. IMPRESSION: Intertrochanteric fracture. Electronically Signed   By: Sydell Eva M.D.   On: 09/27/2023 09:40   DG HIP UNILAT W OR W/O PELVIS 2-3 VIEWS LEFT Result Date: 09/26/2023 CLINICAL DATA:  Status post fall. EXAM: DG HIP (WITH OR WITHOUT PELVIS) 2-3V LEFT COMPARISON:  None Available. FINDINGS: An acute mildly displaced fracture deformity is seen extending through the inter trochanteric region of the proximal left femur. There is no evidence of dislocation. A radiopaque intramedullary rod and compression screw device are seen within the proximal right femur. Mild degenerative changes are seen in the form of joint space narrowing and acetabular sclerosis. Additional degenerative changes are present within the visualized portion of the lower lumbar spine. IMPRESSION: Acute mildly displaced intertrochanteric fracture of the proximal left femur. Electronically Signed   By: Virgle Grime M.D.   On: 09/26/2023 23:12   DG Chest 1 View Result Date: 09/26/2023 CLINICAL DATA:  Status post fall. EXAM: CHEST  1 VIEW COMPARISON:  February 11, 2022 FINDINGS: The heart size and mediastinal contours are within normal limits. There is moderate severity calcification of the aortic arch. Low lung volumes are noted. Mild atelectatic changes seen within the bilateral lung bases. There  is no evidence of acute infiltrate, pleural effusion or pneumothorax. Multilevel degenerative changes seen throughout the thoracic spine. IMPRESSION: Low lung volumes without acute cardiopulmonary disease. Electronically Signed   By: Virgle Grime M.D.   On: 09/26/2023 23:10       LOS: 1 day   Xianna Siverling  [facility] Pager on [email or web address]  09/28/2023, 10:13 AM

## 2023-09-28 NOTE — Evaluation (Signed)
 Physical Therapy Evaluation Patient Details Name: Marco [name] MRN: [id number] DOB: 08-Jun-1933 Today's Date: 09/28/2023  History of Present Illness  89 y.o. male presents to [facility] hospital on 09/26/2022 after falling at home and with L hip pain. Pt found to have L intertrochanteric fx. Pt underwent IM nailing on 5/25. PMH includes CAD, DMII, prostate CA with bony mets, CKD III, postherpetic neuralgia.  Clinical Impression  Pt presents to PT with deficits in functional mobility, strength, power, gait, balance, endurance. Pt is limited by pain at L hip when mobilizing, requiring physical assistance to perform bed mobility and to transfer out of bed. Pt is encouraged to mobilize frequently with staff assistance in an effort to improve strength and to reduce pain. PT will continue to follow in the acute setting. Patient will benefit from continued inpatient follow up therapy, <3 hours/day.        If plan is discharge home, recommend the following: A lot of help with walking and/or transfers;A lot of help with bathing/dressing/bathroom;Assistance with cooking/housework;Assist for transportation;Help with stairs or ramp for entrance   Can travel by private vehicle   No    Equipment Recommendations BSC/3in1  Recommendations for Other Services       Functional Status Assessment Patient has had a recent decline in their functional status and demonstrates the ability to make significant improvements in function in a reasonable and predictable amount of time.     Precautions / Restrictions Precautions Precautions: Fall Recall of Precautions/Restrictions: Intact Restrictions Weight Bearing Restrictions Per Provider Order: Yes LLE Weight Bearing Per Provider Order: Weight bearing as tolerated      Mobility  Bed Mobility Overal bed mobility: Needs Assistance Bed Mobility: Supine to Sit     Supine to sit: Mod assist, HOB elevated, Used rails          Transfers Overall transfer level:  Needs assistance Equipment used: Rolling walker (2 wheels) Transfers: Sit to/from Stand, Bed to chair/wheelchair/BSC Sit to Stand: Mod assist   Step pivot transfers: Min assist            Ambulation/Gait                  Stairs            Wheelchair Mobility     Tilt Bed    Modified Rankin (Stroke Patients Only)       Balance Overall balance assessment: Needs assistance Sitting-balance support: Single extremity supported, Feet supported Sitting balance-Leahy Scale: Poor Sitting balance - Comments: right lateral lean due to pain at L hip with leftward weight shift Postural control: Right lateral lean Standing balance support: Bilateral upper extremity supported, Reliant on assistive device for balance Standing balance-Leahy Scale: Poor Standing balance comment: CGA for static standing                             Pertinent Vitals/Pain Pain Assessment Pain Assessment: Faces Faces Pain Scale: Hurts whole lot Pain Location: L hip Pain Descriptors / Indicators: Sore Pain Intervention(s): Monitored during session    Home Living Family/patient expects to be discharged to:: Private residence Living Arrangements: Children Available Help at Discharge: Family;Available PRN/intermittently (son and DIL work) Type of Home: House Home Access: Level entry       Home Layout: Two level (pt typically sleeps upstairs, son sleeps downstairs) Home Equipment: Agricultural consultant (2 wheels);Cane - single point;Shower seat;Wheelchair - manual      Prior Function Prior Level  of Function : Independent/Modified Independent             Mobility Comments: ambulating with SPC or RW typically       Extremity/Trunk Assessment   Upper Extremity Assessment Upper Extremity Assessment: Overall WFL for tasks assessed    Lower Extremity Assessment Lower Extremity Assessment: LLE deficits/detail LLE Deficits / Details: generalized weakness, formal ROM  assessment at hip deferred due to pain, ankle and knee ROM WFL    Cervical / Trunk Assessment Cervical / Trunk Assessment: Kyphotic  Communication   Communication Communication: No apparent difficulties    Cognition Arousal: Alert Behavior During Therapy: WFL for tasks assessed/performed   PT - Cognitive impairments: No apparent impairments                         Following commands: Intact       Cueing Cueing Techniques: Verbal cues     General Comments General comments (skin integrity, edema, etc.): VSS on RA    Exercises     Assessment/Plan    PT Assessment Patient needs continued PT services  PT Problem List Decreased strength;Decreased range of motion;Decreased activity tolerance;Decreased balance;Decreased mobility;Decreased knowledge of use of DME;Pain       PT Treatment Interventions DME instruction;Gait training;Stair training;Functional mobility training;Therapeutic activities;Therapeutic exercise;Neuromuscular re-education;Balance training;Patient/family education    PT Goals (Current goals can be found in the Care Plan section)  Acute Rehab PT Goals Patient Stated Goal: to return to independence PT Goal Formulation: With patient Time For Goal Achievement: 10/12/23 Potential to Achieve Goals: Good    Frequency Min 3X/week     Co-evaluation               AM-PAC PT "6 Clicks" Mobility  Outcome Measure Help needed turning from your back to your side while in a flat bed without using bedrails?: A Lot Help needed moving from lying on your back to sitting on the side of a flat bed without using bedrails?: A Lot Help needed moving to and from a bed to a chair (including a wheelchair)?: A Little Help needed standing up from a chair using your arms (e.g., wheelchair or bedside chair)?: A Lot Help needed to walk in hospital room?: Total Help needed climbing 3-5 steps with a railing? : Total 6 Click Score: 11    End of Session Equipment  Utilized During Treatment: Gait belt Activity Tolerance: Patient tolerated treatment well Patient left: in chair;with call bell/phone within reach;with chair alarm set Nurse Communication: Mobility status PT Visit Diagnosis: Other abnormalities of gait and mobility (R26.89);Muscle weakness (generalized) (M62.81);Pain Pain - Right/Left: Left Pain - part of body: Hip    Time: 0912-0947 PT Time Calculation (min) (ACUTE ONLY): 35 min   Charges:   PT Evaluation $PT Eval Low Complexity: 1 Low   PT General Charges $$ ACUTE PT VISIT: 1 Visit         Rexie Catena, PT, DPT Acute Rehabilitation Office [phone]   Rexie Catena 09/28/2023, 12:45 PM

## 2023-09-28 NOTE — Evaluation (Signed)
 Occupational Therapy Evaluation Patient Details Name: Marco [name] MRN: [id number] DOB: 03/07/1934 Today's Date: 09/28/2023   History of Present Illness   89 y.o. male presents to [facility] hospital on 09/26/2022 after falling at home and with L hip pain. Pt found to have L intertrochanteric fx. Pt underwent IM nailing on 5/25. PMH includes CAD, DMII, prostate CA with bony mets, CKD III, postherpetic neuralgia.     Clinical Impressions Patient admitted for the diagnosis and procedure above.  PTA he lives with family, continued to walk with an AD, and completed his own ADL.  L leg weakness and pain are the deficits impacting independence.  Currently he is needing up to Mod A for simple transfers and Max A for ADL completion from a sit to stand level.  OT will continue efforts in the acute setting to address deficits, and Patient will benefit from continued inpatient follow up therapy, <3 hours/day.     If plan is discharge home, recommend the following:   Assist for transportation;A lot of help with bathing/dressing/bathroom;A lot of help with walking and/or transfers;Assistance with cooking/housework;Help with stairs or ramp for entrance     Functional Status Assessment   Patient has had a recent decline in their functional status and demonstrates the ability to make significant improvements in function in a reasonable and predictable amount of time.     Equipment Recommendations         Recommendations for Other Services         Precautions/Restrictions   Precautions Precautions: Fall Restrictions Weight Bearing Restrictions Per Provider Order: Yes LLE Weight Bearing Per Provider Order: Weight bearing as tolerated     Mobility Bed Mobility Overal bed mobility: Needs Assistance Bed Mobility: Sit to Supine       Sit to supine: Max assist     Patient Response: Cooperative  Transfers Overall transfer level: Needs assistance Equipment used: Rolling walker (2  wheels) Transfers: Sit to/from Stand, Bed to chair/wheelchair/BSC Sit to Stand: Mod assist     Step pivot transfers: Min assist, Mod assist            Balance Overall balance assessment: Needs assistance Sitting-balance support: Feet supported Sitting balance-Leahy Scale: Poor   Postural control: Right lateral lean, Posterior lean Standing balance support: Reliant on assistive device for balance Standing balance-Leahy Scale: Poor                             ADL either performed or assessed with clinical judgement   ADL Overall ADL's : Needs assistance/impaired Eating/Feeding: Set up;Sitting   Grooming: Wash/dry hands;Wash/dry face;Set up;Sitting   Upper Body Bathing: Minimal assistance;Sitting   Lower Body Bathing: Maximal assistance;Sit to/from stand   Upper Body Dressing : Minimal assistance;Sitting   Lower Body Dressing: Maximal assistance;Sit to/from stand   Toilet Transfer: Moderate assistance;Stand-pivot;BSC/3in1;Rolling walker (2 wheels)                   Vision Baseline Vision/History: 1 Wears glasses Patient Visual Report: No change from baseline       Perception Perception: Not tested       Praxis Praxis: Not tested       Pertinent Vitals/Pain Pain Assessment Pain Assessment: Faces Faces Pain Scale: Hurts whole lot Pain Location: L hip Pain Descriptors / Indicators: Grimacing, Guarding, Sharp Pain Intervention(s): Monitored during session, RN gave pain meds during session     Extremity/Trunk Assessment Upper Extremity Assessment Upper Extremity  Assessment: Overall WFL for tasks assessed   Lower Extremity Assessment Lower Extremity Assessment: Defer to PT evaluation   Cervical / Trunk Assessment Cervical / Trunk Assessment: Kyphotic   Communication Communication Communication: No apparent difficulties   Cognition Arousal: Alert Behavior During Therapy: Flat affect Cognition: No apparent impairments                                Following commands: Intact       Cueing  General Comments   Cueing Techniques: Verbal cues   Complete 1 unit of RBC's   Exercises     Shoulder Instructions      Home Living Family/patient expects to be discharged to:: Private residence Living Arrangements: Children Available Help at Discharge: Family;Available PRN/intermittently Type of Home: House Home Access: Level entry     Home Layout: Two level     Bathroom Shower/Tub: Tub/shower unit;Walk-in shower   Bathroom Toilet: Standard Bathroom Accessibility: Yes How Accessible: Accessible via walker Home Equipment: Rolling Walker (2 wheels);Cane - single point;Shower seat;Wheelchair - manual          Prior Functioning/Environment Prior Level of Function : Independent/Modified Independent             Mobility Comments: ambulating with SPC or RW typically ADLs Comments: Mod I with ADL, can help with light mael prep, but does not complete heavy iADL.  Family assist with community mobility.    OT Problem List: Decreased strength;Decreased range of motion;Decreased activity tolerance;Impaired balance (sitting and/or standing);Pain   OT Treatment/Interventions: Self-care/ADL training;Therapeutic activities;Patient/family education;DME and/or AE instruction;Balance training      OT Goals(Current goals can be found in the care plan section)   Acute Rehab OT Goals Patient Stated Goal: Return home OT Goal Formulation: With patient Time For Goal Achievement: 10/12/23 Potential to Achieve Goals: Good ADL Goals Pt Will Perform Grooming: with contact guard assist;standing Pt Will Perform Lower Body Dressing: with min assist;sit to/from stand;with adaptive equipment Pt Will Transfer to Toilet: stand pivot transfer;bedside commode;with contact guard assist   OT Frequency:  Min 2X/week    Co-evaluation              AM-PAC OT "6 Clicks" Daily Activity     Outcome Measure Help from  another person eating meals?: None Help from another person taking care of personal grooming?: A Little Help from another person toileting, which includes using toliet, bedpan, or urinal?: A Lot Help from another person bathing (including washing, rinsing, drying)?: A Lot Help from another person to put on and taking off regular upper body clothing?: A Little Help from another person to put on and taking off regular lower body clothing?: A Lot 6 Click Score: 16   End of Session Equipment Utilized During Treatment: Gait belt;Rolling walker (2 wheels) Nurse Communication: Mobility status  Activity Tolerance: Patient tolerated treatment well Patient left: in bed;with call bell/phone within reach;with bed alarm set;with family/visitor present  OT Visit Diagnosis: Unsteadiness on feet (R26.81);Muscle weakness (generalized) (M62.81);History of falling (Z91.81);Pain Pain - Right/Left: Left Pain - part of body: Leg;Hip                Time: 1330-1355 OT Time Calculation (min): 25 min Charges:  OT General Charges $OT Visit: 1 Visit OT Evaluation $OT Eval Moderate Complexity: 1 Mod OT Treatments $Self Care/Home Management : 8-22 mins  09/28/2023  RP, OTR/L  Acute Rehabilitation Services  Office:  [phone]  Marco [name] 09/28/2023, 2:01 PM

## 2023-09-29 ENCOUNTER — Encounter (HOSPITAL_COMMUNITY): Payer: Self-pay | Admitting: Orthopedic Surgery

## 2023-09-29 DIAGNOSIS — C61 Malignant neoplasm of prostate: Secondary | ICD-10-CM | POA: Diagnosis not present

## 2023-09-29 DIAGNOSIS — D62 Acute posthemorrhagic anemia: Secondary | ICD-10-CM | POA: Diagnosis not present

## 2023-09-29 DIAGNOSIS — E119 Type 2 diabetes mellitus without complications: Secondary | ICD-10-CM | POA: Diagnosis not present

## 2023-09-29 DIAGNOSIS — E039 Hypothyroidism, unspecified: Secondary | ICD-10-CM | POA: Diagnosis not present

## 2023-09-29 LAB — CBC
HCT: 29.5 % — ABNORMAL LOW (ref 39.0–52.0)
Hemoglobin: 9.7 g/dL — ABNORMAL LOW (ref 13.0–17.0)
MCH: 28 pg (ref 26.0–34.0)
MCHC: 32.9 g/dL (ref 30.0–36.0)
MCV: 85.3 fL (ref 80.0–100.0)
Platelets: 122 10*3/uL — ABNORMAL LOW (ref 150–400)
RBC: 3.46 MIL/uL — ABNORMAL LOW (ref 4.22–5.81)
RDW: 13.9 % (ref 11.5–15.5)
WBC: 6.7 10*3/uL (ref 4.0–10.5)
nRBC: 0 % (ref 0.0–0.2)

## 2023-09-29 LAB — BPAM RBC
Blood Product Expiration Date: 202506172359
Blood Product Expiration Date: 202506202359
ISSUE DATE / TIME: 202505261053
ISSUE DATE / TIME: 202505261527
Unit Type and Rh: 5100
Unit Type and Rh: 5100

## 2023-09-29 LAB — TYPE AND SCREEN
ABO/RH(D): O POS
Antibody Screen: NEGATIVE
Unit division: 0
Unit division: 0

## 2023-09-29 LAB — GLUCOSE, CAPILLARY
Glucose-Capillary: 176 mg/dL — ABNORMAL HIGH (ref 70–99)
Glucose-Capillary: 224 mg/dL — ABNORMAL HIGH (ref 70–99)
Glucose-Capillary: 208 mg/dL — ABNORMAL HIGH (ref 70–99)

## 2023-09-29 LAB — BASIC METABOLIC PANEL WITH GFR
Anion gap: 9 (ref 5–15)
BUN: 20 mg/dL (ref 8–23)
CO2: 26 mmol/L (ref 22–32)
Calcium: 8.8 mg/dL — ABNORMAL LOW (ref 8.9–10.3)
Chloride: 101 mmol/L (ref 98–111)
Creatinine, Ser: 1.51 mg/dL — ABNORMAL HIGH (ref 0.61–1.24)
GFR, Estimated: 44 mL/min — ABNORMAL LOW (ref >60)
Glucose, Bld: 152 mg/dL — ABNORMAL HIGH (ref 70–99)
Potassium: 4.3 mmol/L (ref 3.5–5.1)
Sodium: 136 mmol/L (ref 135–145)

## 2023-09-29 MED ORDER — ENZALUTAMIDE 40 MG PO CAPS: 120.0000 mg | ORAL_CAPSULE | Freq: Every day | ORAL | Status: DC

## 2023-09-29 MED ORDER — ENZALUTAMIDE 40 MG PO TABS
120.0000 mg | ORAL_TABLET | Freq: Every day | ORAL | Status: DC
  Administered 2023-09-29 – 2023-09-30 (×2): 120 mg via ORAL
  Filled 2023-09-29 (×3): qty 3

## 2023-09-29 MED ORDER — GLUCERNA SHAKE PO LIQD
237.0000 mL | Freq: Three times a day (TID) | ORAL | Status: DC
  Administered 2023-09-29 – 2023-09-30 (×5): 237 mL via ORAL
  Filled 2023-09-29: qty 237

## 2023-09-29 NOTE — Progress Notes (Signed)
 [facility] PROGRESS NOTE   KEELEN QUEVEDO KGM:[id number] DOB: 05-Dec-1933 DOA: 09/26/2023  PCP: Ezell Hollow, MD  Brief History: 89 y.o. male with medical history significant of CAD, non-insulin -dependent DM type II, prostatic cancer with bony metastasis,  chronic sacral insufficiency fracture, CKD stage III and postherpetic neuralgia presented to emergency department complaining of mechanical fall and since the fall patient has noticing left-sided hip pain, limb shortening.  Patient was found to have hip fracture.  He was hospitalized for further management.    Consultants: Orthopedics  Procedures: ORIF left hip    Subjective/Interval History: Patient seems to be more comfortable today compared to yesterday.  Denies any significant pain.  Slept well overnight.  Eating his breakfast this morning.     Assessment/Plan:  Left hip fracture Secondary to mechanical fall.  Patient seen by orthopedics.  Underwent surgery.  PT and OT eval.  Will likely need to go to skilled nursing facility for short-term rehab. Started on aspirin  twice a day by orthopedics for DVT prophylaxis.    Acute blood loss anemia Drop in hemoglobin noted.  Most likely due to fracture and operative loss. Due to history of CAD patient was transfused 2 units of PRBC yesterday with improvement in hemoglobin. Anemia panel shows ferritin of 43, iron 45, TIBC 336, percent saturation 13.  Vitamin B12 level 1860.  Folic acid 15.8.  Metastatic prostate cancer Has been followed by urology.  Has been on Xtandi .  Recent PET scan in April showed bony metastases to pelvis spine and ribs.  Apparently has been referred to medical oncology but has not been seen by them yet.  Diabetes mellitus type 2 Monitor CBGs.  Patient is on metformin  prior to admission.  Continue SSI.  Hyperlipidemia On statin prior to admission  Hypothyroidism Continue levothyroxine .  Chronic kidney disease stage  IIIb Stable  Hyperkalemia Potassium level was noted to be elevated.  Apparently has a history of same and has required Lokelma  in the past.  Stable in the last 48 hours.    Coronary artery disease Stable.  DVT Prophylaxis: Aspirin  twice a day Code Status: Full code Family Communication: No family at bedside today. Disposition Plan: SNF     Medications: Scheduled:  acetaminophen   1,000 mg Oral Q8H   aspirin   81 mg Oral BID   enzalutamide   120 mg Oral Daily   feeding supplement (GLUCERNA SHAKE)  237 mL Oral TID BM   insulin  aspart  0-5 Units Subcutaneous QHS   insulin  aspart  0-6 Units Subcutaneous TID WC   levothyroxine   100 mcg Oral Q0600   Continuous:   PRN:HYDROmorphone  (DILAUDID ) injection, methocarbamol , ondansetron  **OR** ondansetron  (ZOFRAN ) IV, oxyCODONE , senna-docusate  Antibiotics: Anti-infectives (From admission, onward)    Start     Dose/Rate Route Frequency Ordered Stop   09/27/23 1800  ceFAZolin (ANCEF) IVPB 2g/100 mL premix        2 g 200 mL/hr over 30 Minutes Intravenous Every 8 hours 09/27/23 1325 09/28/23 1428   09/27/23 1000  ceFAZolin (ANCEF) IVPB 2g/100 mL premix  Status:  Discontinued        2 g 200 mL/hr over 30 Minutes Intravenous To Surgery 09/27/23 0754 09/27/23 1325       Objective:  Vital Signs  Vitals:   09/28/23 1810 09/28/23 1929 09/29/23 0351 09/29/23 0721  BP: 119/67 117/70 124/65 138/72  Pulse: (!) 107 100 87 86  Resp: 16 15 15 16   Temp: 98.8 F (37.1 C) 98.3 F (36.8 C) 98.6  F (37 C) 98.6 F (37 C)  TempSrc: Oral     SpO2: 99% 98% 95% 99%  Weight:      Height:        Intake/Output Summary (Last 24 hours) at 09/29/2023 1054 Last data filed at 09/29/2023 0700 Gross per 24 hour  Intake 1197 ml  Output 1800 ml  Net -603 ml   Filed Weights   09/26/23 2243  Weight: 63.5 kg    General appearance: Awake alert.  In no distress Resp: Clear to auscultation bilaterally.  Normal effort Cardio: S1-S2 is normal regular.   No S3-S4.  No rubs murmurs or bruit GI: Abdomen is soft.  Nontender nondistended.  Bowel sounds are present normal.  No masses organomegaly   Lab Results:  Data Reviewed: I have personally reviewed following labs and reports of the imaging studies  CBC: Recent Labs  Lab 09/27/23 0000 09/27/23 0518 09/28/23 0602 09/28/23 1959 09/29/23 0540  WBC 7.9 7.5 6.8  --  6.7  NEUTROABS 5.8  --   --   --   --   HGB 10.3* 10.0* 7.3* 10.1* 9.7*  HCT 32.2* 31.8* 22.9* 30.4* 29.5*  MCV 86.6 87.4 85.1  --  85.3  PLT 212 201 139*  --  122*    Basic Metabolic Panel: Recent Labs  Lab 09/27/23 0000 09/27/23 0518 09/27/23 1433 09/28/23 0602 09/29/23 0540  NA 139 136  --  136 136  K 4.9 5.6* 5.3* 4.7 4.3  CL 102 101  --  103 101  CO2 25 27  --  28 26  GLUCOSE 184* 186*  --  147* 152*  BUN 23 22  --  20 20  CREATININE 1.45* 1.48*  --  1.50* 1.51*  CALCIUM  10.0 9.8  --  8.8* 8.8*    GFR: Estimated Creatinine Clearance: 30.4 mL/min (A) (by C-G formula based on SCr of 1.51 mg/dL (H)).  Liver Function Tests: Recent Labs  Lab 09/27/23 0000 09/27/23 0518  AST 24 21  ALT 12 13  ALKPHOS 63 56  BILITOT 0.7 0.6  PROT 6.4* 6.2*  ALBUMIN 3.3* 3.1*     Coagulation Profile: Recent Labs  Lab 09/27/23 0000  INR 1.0     Recent Results (from the past 240 hours)  Surgical pcr screen     Status: None   Collection Time: 09/27/23  5:32 AM   Specimen: Nasal Mucosa; Nasal Swab  Result Value Ref Range Status   MRSA, PCR NEGATIVE NEGATIVE Final   Staphylococcus aureus NEGATIVE NEGATIVE Final    Comment: (NOTE) The Xpert SA Assay (FDA approved for NASAL specimens in patients 39 years of age and older), is one component of a comprehensive surveillance program. It is not intended to diagnose infection nor to guide or monitor treatment. Performed at [facility], [address] [place], [place] [address]       Radiology Studies: DG HIP UNILAT WITH PELVIS 1V LEFT Result Date:  09/27/2023 CLINICAL DATA:  Postop. EXAM: DG HIP (WITH OR WITHOUT PELVIS) 1V*L* COMPARISON:  Radiograph earlier today FINDINGS: Frogleg lateral view requested by the referring provider. Femoral intramedullary nail and trans trochanteric screw fixation of intertrochanteric femur fracture. Mild residual fracture displacement. No change in alignment from earlier today. Recent postsurgical change includes air and edema in the soft tissues with lateral skin staples in place. IMPRESSION: ORIF of intertrochanteric femur fracture. No change in alignment from earlier today. Electronically Signed   By: Alvina Axon.D.  On: 09/27/2023 16:00   DG FEMUR MIN 2 VIEWS LEFT Result Date: 09/27/2023 CLINICAL DATA:  Postop. EXAM: LEFT FEMUR 2 VIEWS COMPARISON:  Preoperative imaging FINDINGS: Femoral intramedullary nail with trans trochanteric and distal locking screw fixation traverse proximal femur fracture. Improved fracture alignment from preoperative imaging. Recent postsurgical change includes air and edema in the soft tissues with multiple skin staples in place. IMPRESSION: ORIF of proximal femur fracture. Electronically Signed   By: Chadwick Colonel M.D.   On: 09/27/2023 15:28   DG FEMUR MIN 2 VIEWS LEFT Result Date: 09/27/2023 CLINICAL DATA:  [id number] Surgery, elective [id number] EXAM: LEFT FEMUR 2 VIEWS COMPARISON:  Sep 27, 2023 FINDINGS: Spot fluoroscopy images were obtained for surgical planning purposes. This demonstrates placement of an intramedullary rod. There is persistent displacement of the lesser trochanter. Time: 1 minute 47 seconds Dose: 15.85 mGy Please reference procedure report for further details. IMPRESSION: Spot fluoroscopy images for surgical planning purposes. Electronically Signed   By: Clancy Crimes M.D.   On: 09/27/2023 12:19   DG C-Arm 1-60 Min-No Report Result Date: 09/27/2023 Fluoroscopy was utilized by the requesting physician.  No radiographic interpretation.   DG C-Arm 1-60 Min-No  Report Result Date: 09/27/2023 Fluoroscopy was utilized by the requesting physician.  No radiographic interpretation.       LOS: 2 days   Toivo Bordon Lyndon Santiago  [facility] Pager on [email or web address]  09/29/2023, 10:54 AM

## 2023-09-29 NOTE — Progress Notes (Signed)
 Physical Therapy Treatment Patient Details Name: Marco [name] MRN: [id number] DOB: 04-13-1934 Today's Date: 09/29/2023   History of Present Illness 89 y.o. male presents to [facility] hospital on 09/26/2022 after falling at home and with L hip pain. Pt found to have L intertrochanteric fx. Pt underwent IM nailing on 5/25. PMH includes CAD, DMII, prostate CA with bony mets, CKD III, postherpetic neuralgia.    PT Comments  Pt tolerates treatment well, transferring with less physical assistance and progressing to short bouts of ambulation. Pt is encouraged to participate in LE exercise for strengthening and neuromuscular activation. PT will continue to follow. Patient will benefit from continued inpatient follow up therapy, <3 hours/day.    If plan is discharge home, recommend the following: A lot of help with walking and/or transfers;A lot of help with bathing/dressing/bathroom;Assistance with cooking/housework;Assist for transportation;Help with stairs or ramp for entrance   Can travel by private vehicle     No  Equipment Recommendations  BSC/3in1    Recommendations for Other Services       Precautions / Restrictions Precautions Precautions: Fall Recall of Precautions/Restrictions: Intact Restrictions Weight Bearing Restrictions Per Provider Order: No LLE Weight Bearing Per Provider Order: Weight bearing as tolerated     Mobility  Bed Mobility Overal bed mobility: Needs Assistance Bed Mobility: Supine to Sit     Supine to sit: Mod assist, HOB elevated          Transfers Overall transfer level: Needs assistance Equipment used: Rolling walker (2 wheels) Transfers: Sit to/from Stand Sit to Stand: Min assist           General transfer comment: increased time and verbal cues for hand placement and anterior weight shift    Ambulation/Gait Ambulation/Gait assistance: Min assist Gait Distance (Feet): 12 Feet Assistive device: Rolling walker (2 wheels) Gait  Pattern/deviations: Step-to pattern Gait velocity: reduced Gait velocity interpretation: <1.31 ft/sec, indicative of household ambulator   General Gait Details: pt with slowed step-to gait, reduced stance time on LLE   Stairs             Wheelchair Mobility     Tilt Bed    Modified Rankin (Stroke Patients Only)       Balance Overall balance assessment: Needs assistance Sitting-balance support: No upper extremity supported, Feet supported Sitting balance-Leahy Scale: Good     Standing balance support: Bilateral upper extremity supported, Reliant on assistive device for balance Standing balance-Leahy Scale: Poor                              Communication Communication Communication: No apparent difficulties  Cognition Arousal: Alert Behavior During Therapy: WFL for tasks assessed/performed   PT - Cognitive impairments: No apparent impairments                         Following commands: Intact      Cueing Cueing Techniques: Verbal cues  Exercises General Exercises - Lower Extremity Ankle Circles/Pumps: AROM, Both, 5 reps Quad Sets: AROM, Both, 5 reps Gluteal Sets: AROM, Both, 5 reps    General Comments General comments (skin integrity, edema, etc.): VSS on RA      Pertinent Vitals/Pain Pain Assessment Pain Assessment: 0-10 Pain Score: 4  Pain Location: L hip pre-mobility Pain Descriptors / Indicators: Sore Pain Intervention(s): Premedicated before session    Home Living  Prior Function            PT Goals (current goals can now be found in the care plan section) Acute Rehab PT Goals Patient Stated Goal: to return to independence Progress towards PT goals: Progressing toward goals    Frequency    Min 3X/week      PT Plan      Co-evaluation              AM-PAC PT "6 Clicks" Mobility   Outcome Measure  Help needed turning from your back to your side while in a flat bed  without using bedrails?: A Lot Help needed moving from lying on your back to sitting on the side of a flat bed without using bedrails?: A Lot Help needed moving to and from a bed to a chair (including a wheelchair)?: A Little Help needed standing up from a chair using your arms (e.g., wheelchair or bedside chair)?: A Little Help needed to walk in hospital room?: A Lot Help needed climbing 3-5 steps with a railing? : Total 6 Click Score: 13    End of Session Equipment Utilized During Treatment: Gait belt Activity Tolerance: Patient tolerated treatment well Patient left: in chair;with call bell/phone within reach;with chair alarm set Nurse Communication: Mobility status PT Visit Diagnosis: Other abnormalities of gait and mobility (R26.89);Muscle weakness (generalized) (M62.81);Pain Pain - Right/Left: Left Pain - part of body: Hip     Time: 0981-1914 PT Time Calculation (min) (ACUTE ONLY): 27 min  Charges:    $Gait Training: 8-22 mins $Therapeutic Activity: 8-22 mins PT General Charges $$ ACUTE PT VISIT: 1 Visit                     Rexie Catena, PT, DPT Acute Rehabilitation Office [phone]    Rexie Catena 09/29/2023, 1:23 PM

## 2023-09-29 NOTE — TOC Initial Note (Signed)
 Transition of Care [facility]) - Initial/Assessment Note    Patient Details  Name: Marco [name] MRN: [id number] Date of Birth: [birth year]  Transition of Care [facility]) CM/SW Contact:    Elspeth Hals, LCSW Phone Number: 09/29/2023, 4:02 PM  Clinical Narrative:      CSW spoke with pt regarding PT recommendation for SNF.  Pt oriented x4 and able to participate in conversation but somewhat agitated as well.  Pt indicates he has been to SNF before, says agreeable to SNF now.  States he lives with his son, permission given to speak with son Alvia Jointer.  Pt then starts attempting to contact RN, distracted.  CSW sent message and voicemail to son Alvia Jointer. Referral sent out in hub for SNF.             Expected Discharge Plan: Skilled Nursing Facility Barriers to Discharge: Continued Medical Work up, SNF Pending bed offer   Patient Goals and CMS Choice            Expected Discharge Plan and Services In-house Referral: Clinical Social Work   Post Acute Care Choice: Skilled Nursing Facility Living arrangements for the past 2 months: Single Family Home                                      Prior Living Arrangements/Services Living arrangements for the past 2 months: Single Family Home Lives with:: Adult Children (with son Alvia Jointer) Patient language and need for interpreter reviewed:: Yes        Need for Family Participation in Patient Care: Yes (Comment) Care giver support system in place?: Yes (comment)   Criminal Activity/Legal Involvement Pertinent to Current Situation/Hospitalization: No - Comment as needed  Activities of Daily Living   ADL Screening (condition at time of admission) Independently performs ADLs?: Yes (appropriate for developmental age) Is the patient deaf or have difficulty hearing?: No Does the patient have difficulty seeing, even when wearing glasses/contacts?: No Does the patient have difficulty concentrating, remembering, or making decisions?:  No  Permission Sought/Granted Permission sought to share information with : Family Supports Permission granted to share information with : Yes, Verbal Permission Granted  Share Information with NAME: son [name]           Emotional Assessment Appearance:: Appears stated age Attitude/Demeanor/Rapport: Engaged Affect (typically observed): Agitated Orientation: : Oriented to Self, Oriented to Place, Oriented to  Time, Oriented to Situation      Admission diagnosis:  Intertrochanteric fracture of left femur (HCC) [S72.142A] Closed fracture of left hip, initial encounter (HCC) [S72.002A] Patient Active Problem List   Diagnosis Date Noted   Intertrochanteric fracture of left femur (HCC) 09/27/2023   History of CAD (coronary artery disease) 09/27/2023   Callus 03/05/2023   Hallux limitus of left foot 03/05/2023   Fall at home, initial encounter 02/11/2022   Prostate cancer metastatic to bone (HCC) 05/29/2021   CKD (chronic kidney disease), stage III (HCC) 02/11/2021   CAD (coronary artery disease) 03/06/2020   Hypothyroidism 12/02/2017   Chronic pain of right knee 10/13/2017   Chest pain 11/19/2016   Amaurosis fugax 12/12/2015   PCP NOTES >>> 03/08/2015   Osteoporosis 09/15/2014   Primary osteoarthritis of both knees 03/16/2014   Hearing difficulty 10/20/2013   DJD (degenerative joint disease) 08/25/2013   Neck mass 04/25/2013   Hyperlipidemia 04/25/2013   Erectile dysfunction 12/26/2011   Annual physical exam 08/22/2011   Prostate nodule  08/22/2011   Non-insulin  dependent type 2 diabetes mellitus (HCC) 05/07/2011   PCP:  Ezell Hollow, MD Pharmacy:   [facility] - [place], [place] - [id number] [place] [address] Phone: [phone] Fax: [phone]  [id number] - [place], [place] - [address] AT [facility] [address] [facility] [address] [place] [address] Phone: [phone] Fax:  [phone]     Social Drivers of Health (SDOH) Social History: SDOH Screenings   Food Insecurity: No Food Insecurity (09/27/2023)  Housing: Low Risk  (09/27/2023)  Transportation Needs: No Transportation Needs (09/27/2023)  Utilities: Not At Risk (09/27/2023)  Alcohol  Screen: Low Risk  (09/04/2021)  Depression (PHQ2-9): Low Risk  (07/10/2023)  Financial Resource Strain: Low Risk  (10/30/2021)  Physical Activity: Inactive (10/25/2020)  Social Connections: Moderately Isolated (09/27/2023)  Stress: No Stress Concern Present (09/04/2021)  Tobacco Use: Low Risk  (09/27/2023)   SDOH Interventions:     Readmission Risk Interventions     No data to display

## 2023-09-29 NOTE — TOC CAGE-AID Note (Signed)
 Transition of Care [facility]) - CAGE-AID Screening   Patient Details  Name: Marco [name] MRN: [id number] Date of Birth: 1933-12-21  Transition of Care [facility]) CM/SW Contact:    [name] E Solenne Manwarren, LCSW Phone Number: 09/29/2023, 8:47 AM   Clinical Narrative:    CAGE-AID Screening:    Have You Ever Felt You Ought to Cut Down on Your Drinking or Drug Use?: No Have People Annoyed You By [employer] Your Drinking Or Drug Use?: No Have You Felt Bad Or Guilty About Your Drinking Or Drug Use?: No Have You Ever Had a Drink or Used Drugs First Thing In The Morning to Steady Your Nerves or to Get Rid of a Hangover?: No CAGE-AID Score: 0  Substance Abuse Education Offered: No

## 2023-09-29 NOTE — Plan of Care (Signed)

## 2023-09-29 NOTE — Care Management Important Message (Signed)
 Important Message  Patient Details  Name: Marco [name] MRN: [id number] Date of Birth: 17-Nov-1933   Important Message Given:  Yes - Medicare IM     Felix Host 09/29/2023, 2:52 PM

## 2023-09-29 NOTE — NC FL2 (Signed)
 [place]  MEDICAID FL2 LEVEL OF CARE FORM     IDENTIFICATION  Patient Name: Marco [name] Birthdate: [birth year] Sex: male Admission Date (Current Location): 09/26/2023  [place] and [place] Number:  Ernesto Heady [id number] L Facility and Address:  The . [facility], [address] [place], [place] [address]      Provider Number: [id number]  Attending Physician Name and Address:  Maylene Spear, MD  Relative Name and Phone Number:  [place], Crescenzo [phone]    Current Level of Care: Hospital Recommended Level of Care: Skilled Nursing Facility Prior Approval Number:    Date Approved/Denied:   PASRR Number: [id number]  Discharge Plan: SNF    Current Diagnoses: Patient Active Problem List   Diagnosis Date Noted   Intertrochanteric fracture of left femur (HCC) 09/27/2023   History of CAD (coronary artery disease) 09/27/2023   Callus 03/05/2023   Hallux limitus of left foot 03/05/2023   Fall at home, initial encounter 02/11/2022   Prostate cancer metastatic to bone (HCC) 05/29/2021   CKD (chronic kidney disease), stage III (HCC) 02/11/2021   CAD (coronary artery disease) 03/06/2020   Hypothyroidism 12/02/2017   Chronic pain of right knee 10/13/2017   Chest pain 11/19/2016   Amaurosis fugax 12/12/2015   PCP NOTES >>> 03/08/2015   Osteoporosis 09/15/2014   Primary osteoarthritis of both knees 03/16/2014   Hearing difficulty 10/20/2013   DJD (degenerative joint disease) 08/25/2013   Neck mass 04/25/2013   Hyperlipidemia 04/25/2013   Erectile dysfunction 12/26/2011   Annual physical exam 08/22/2011   Prostate nodule 08/22/2011   Non-insulin  dependent type 2 diabetes mellitus (HCC) 05/07/2011    Orientation RESPIRATION BLADDER Height & Weight     Self, Time, Situation, Place  Normal Continent Weight: 140 lb (63.5 kg) Height:  5\' 9"  (175.3 cm)  BEHAVIORAL SYMPTOMS/MOOD NEUROLOGICAL BOWEL NUTRITION STATUS      Continent Diet (see discharge  summary)  AMBULATORY STATUS COMMUNICATION OF NEEDS Skin   Total Care Verbally Surgical wounds                       Personal Care Assistance Level of Assistance  Bathing, Feeding, Dressing Bathing Assistance: Maximum assistance Feeding assistance: Limited assistance Dressing Assistance: Maximum assistance     Functional Limitations Info  Sight, Hearing, Speech Sight Info: Adequate Hearing Info: Impaired Speech Info: Adequate    SPECIAL CARE FACTORS FREQUENCY  PT (By licensed PT), OT (By licensed OT)     PT Frequency: 5x week OT Frequency: 5x week            Contractures Contractures Info: Not present    Additional Factors Info  Code Status, Allergies, Insulin  Sliding Scale Code Status Info: full Allergies Info: Beef-derived Drug Products, Chicken Allergy, Fish Allergy, Pork-derived Products   Insulin  Sliding Scale Info: Novolog : see discharge summary       Current Medications (09/29/2023):  This is the current hospital active medication list Current Facility-Administered Medications  Medication Dose Route Frequency Provider Last Rate Last Admin   acetaminophen  (TYLENOL ) tablet 1,000 mg  1,000 mg Oral Q8H Moore, Michael A, MD   1,000 mg at 09/29/23 1500   aspirin  chewable tablet 81 mg  81 mg Oral BID Moore, Michael A, MD   81 mg at 09/29/23 1000   enzalutamide  (XTANDI ) tablet 120 mg/3 tablets-Pt's Own Supply  120 mg Oral Daily Krishnan, Gokul, MD   120 mg at 09/29/23 1220   feeding supplement (GLUCERNA SHAKE) (GLUCERNA SHAKE) liquid 237 mL  237 mL Oral TID BM Krishnan, Gokul, MD   237 mL at 09/29/23 1500   HYDROmorphone  (DILAUDID ) injection 0.5 mg  0.5 mg Intravenous Q3H PRN Moore, Michael A, MD       insulin  aspart (novoLOG ) injection 0-5 Units  0-5 Units Subcutaneous QHS Sundil, Subrina, MD   3 Units at 09/27/23 2141   insulin  aspart (novoLOG ) injection 0-6 Units  0-6 Units Subcutaneous TID WC Sundil, Subrina, MD   2 Units at 09/29/23 1218   levothyroxine   (SYNTHROID ) tablet 100 mcg  100 mcg Oral Q0600 Moore, Michael A, MD   100 mcg at 09/29/23 0556   methocarbamol  (ROBAXIN ) tablet 500 mg  500 mg Oral Q8H PRN Krishnan, Gokul, MD   500 mg at 09/29/23 1001   ondansetron  (ZOFRAN ) tablet 4 mg  4 mg Oral Q6H PRN Diedra Fowler, MD       Or   ondansetron  (ZOFRAN ) injection 4 mg  4 mg Intravenous Q6H PRN Diedra Fowler, MD       oxyCODONE  (Oxy IR/ROXICODONE ) immediate release tablet 5 mg  5 mg Oral Q4H PRN Moore, Michael A, MD   5 mg at 09/29/23 1000   senna-docusate (Senokot-S) tablet 1 tablet  1 tablet Oral QHS PRN Moore, Michael A, MD         Discharge Medications: Please see discharge summary for a list of discharge medications.  Relevant Imaging Results:  Relevant Lab Results:   Additional Information SSN: [id number]  Elspeth Hals, LCSW

## 2023-09-30 DIAGNOSIS — S72142D Displaced intertrochanteric fracture of left femur, subsequent encounter for closed fracture with routine healing: Secondary | ICD-10-CM

## 2023-09-30 LAB — CBC
HCT: 30.3 % — ABNORMAL LOW (ref 39.0–52.0)
Hemoglobin: 9.7 g/dL — ABNORMAL LOW (ref 13.0–17.0)
MCH: 28 pg (ref 26.0–34.0)
MCHC: 32 g/dL (ref 30.0–36.0)
MCV: 87.6 fL (ref 80.0–100.0)
Platelets: 142 10*3/uL — ABNORMAL LOW (ref 150–400)
RBC: 3.46 MIL/uL — ABNORMAL LOW (ref 4.22–5.81)
RDW: 14.1 % (ref 11.5–15.5)
WBC: 7.1 10*3/uL (ref 4.0–10.5)
nRBC: 0 % (ref 0.0–0.2)

## 2023-09-30 LAB — BASIC METABOLIC PANEL WITH GFR
Anion gap: 6 (ref 5–15)
BUN: 23 mg/dL (ref 8–23)
CO2: 26 mmol/L (ref 22–32)
Calcium: 8.3 mg/dL — ABNORMAL LOW (ref 8.9–10.3)
Chloride: 101 mmol/L (ref 98–111)
Creatinine, Ser: 1.36 mg/dL — ABNORMAL HIGH (ref 0.61–1.24)
GFR, Estimated: 50 mL/min — ABNORMAL LOW (ref >60)
Glucose, Bld: 154 mg/dL — ABNORMAL HIGH (ref 70–99)
Potassium: 4.2 mmol/L (ref 3.5–5.1)
Sodium: 133 mmol/L — ABNORMAL LOW (ref 135–145)

## 2023-09-30 LAB — GLUCOSE, CAPILLARY
Glucose-Capillary: 167 mg/dL — ABNORMAL HIGH (ref 70–99)
Glucose-Capillary: 212 mg/dL — ABNORMAL HIGH (ref 70–99)
Glucose-Capillary: 185 mg/dL — ABNORMAL HIGH (ref 70–99)

## 2023-09-30 MED ORDER — METHOCARBAMOL 500 MG PO TABS: 500.0000 mg | ORAL_TABLET | Freq: Three times a day (TID) | ORAL | 0 refills | Status: AC | PRN

## 2023-09-30 MED ORDER — SENNA 8.6 MG PO TABS: 1.0000 | ORAL_TABLET | Freq: Two times a day (BID) | ORAL | 0 refills | Status: AC

## 2023-09-30 MED ORDER — POLYETHYLENE GLYCOL 3350 17 G PO PACK
17.0000 g | PACK | Freq: Every day | ORAL | 0 refills | Status: AC
Start: 2023-09-30 — End: 2023-10-14

## 2023-09-30 MED ORDER — LACTULOSE 10 GM/15ML PO SOLN
20.0000 g | Freq: Two times a day (BID) | ORAL | Status: DC
  Administered 2023-09-30: 20 g via ORAL
  Filled 2023-09-30: qty 30

## 2023-09-30 MED ORDER — ACETAMINOPHEN 500 MG PO TABS: 1000.0000 mg | ORAL_TABLET | Freq: Three times a day (TID) | ORAL | 0 refills | Status: AC

## 2023-09-30 MED ORDER — ASPIRIN 81 MG PO CHEW
81.0000 mg | CHEWABLE_TABLET | Freq: Two times a day (BID) | ORAL | 0 refills | Status: AC
Start: 2023-09-30 — End: 2023-11-11

## 2023-09-30 MED ORDER — OXYCODONE HCL 5 MG PO TABS: 5.0000 mg | ORAL_TABLET | ORAL | 0 refills | Status: AC | PRN

## 2023-09-30 NOTE — Progress Notes (Signed)
 Report given to Rice Chamorro, staff nurse at [facility], all concerns were fully addressed.

## 2023-09-30 NOTE — Plan of Care (Signed)
  Problem: Education: Goal: Knowledge of General Education information will improve Description: Including pain rating scale, medication(s)/side effects and non-pharmacologic comfort measures Outcome: Progressing   Problem: Health Behavior/Discharge Planning: Goal: Ability to manage health-related needs will improve Outcome: Progressing   Problem: Clinical Measurements: Goal: Ability to maintain clinical measurements within normal limits will improve Outcome: Progressing   Problem: Pain Managment: Goal: General experience of comfort will improve and/or be controlled Outcome: Progressing   Problem: Safety: Goal: Ability to remain free from injury will improve Outcome: Progressing

## 2023-09-30 NOTE — TOC Transition Note (Signed)
 Transition of Care [facility]) - Discharge Note   Patient Details  Name: Marco [name] MRN: [id number] Date of Birth: 1933-05-07  Transition of [facility]) CM/SW Contact:  Elspeth Hals, LCSW Phone Number: 09/30/2023, 3:03 PM   Clinical Narrative:   Pt discharging to [facility], room 111.  RN call report to [phone].  PTAR called 1500.    Final next level of care: Skilled Nursing Facility Barriers to Discharge: Barriers Resolved   Patient Goals and CMS Choice            Discharge Placement              Patient chooses bed at: [facility] Patient to be transferred to facility by: ptar Name of family member notified: son [name] Patient and family notified of of transfer: 09/30/23  Discharge Plan and Services Additional resources added to the After Visit Summary for   In-house Referral: Clinical Social Work   Post Acute Care Choice: Skilled Nursing Facility                               Social Drivers of Health (SDOH) Interventions SDOH Screenings   Food Insecurity: No Food Insecurity (09/27/2023)  Housing: Low Risk  (09/27/2023)  Transportation Needs: No Transportation Needs (09/27/2023)  Utilities: Not At Risk (09/27/2023)  Alcohol  Screen: Low Risk  (09/04/2021)  Depression (PHQ2-9): Low Risk  (07/10/2023)  Financial Resource Strain: Low Risk  (10/30/2021)  Physical Activity: Inactive (10/25/2020)  Social Connections: Moderately Isolated (09/27/2023)  Stress: No Stress Concern Present (09/04/2021)  Tobacco Use: Low Risk  (09/27/2023)     Readmission Risk Interventions     No data to display

## 2023-09-30 NOTE — Progress Notes (Signed)
 Mobility Specialist Progress Note:    09/30/23 0900  Mobility  Activity Ambulated with assistance in hallway;Ambulated with assistance in room  Level of Assistance +2 (takes two people) (chair follow)  Assistive Device Front wheel walker  Distance Ambulated (ft) 30 ft  LLE Weight Bearing Per Provider Order WBAT  Activity Response Tolerated well  Mobility Referral Yes  Mobility visit 1 Mobility  Mobility Specialist Start Time (ACUTE ONLY) [id number]  Mobility Specialist Stop Time (ACUTE ONLY) [id number]  Mobility Specialist Time Calculation (min) (ACUTE ONLY) 32 min   Pt received in bed and agreeable. Required modA for bed mobility. C/o LLE pain throughout. Able to stand and ambulate w/ minA. Son assisted w/ chair follow. Pt left in chair with call bell and chair alarm on. Family present.  [place] Nolon Baxter Mobility Specialist Please contact via Special educational needs teacher or Rehab office at [phone]

## 2023-09-30 NOTE — Progress Notes (Signed)
 Orthopedic Surgery Progress Note   Assessment: Patient is a 89 y.o. male with left intertrochanteric femur fracture s/p CMN   Plan: -Operative plans: complete -Diet: regular -DVT ppx: aspirin  81mg  BID -Antibiotics: ancef x2 post-op doses -Weight bearing status: as tolerated  -PT evaluate and treat -Pain control -Would hold off on any chemotherapy until patient's surgical wounds have healed -Dispo: SNF  ___________________________________________________________________________  Subjective: No acute events overnight. Has been working with PT and ambulating around the room. Pain controlled with current medications.    Physical Exam:  General: no acute distress, appears stated age Neurologic: alert, answering questions appropriately, following commands Respiratory: unlabored breathing on room air, symmetric chest rise Psychiatric: appropriate affect, normal cadence to speech  MSK:   -Left lower extremity  Dressings over hip c/d/i EHL/TA/GSC intact Plantarflexes and dorsiflexes toes Sensation intact to light touch in sural, saphenous, tibial, deep peroneal, and superficial peroneal nerve distributions Foot warm and well perfused, palpable DP pulse   Patient name: Marco [name] Patient MRN: [id number] Date: 09/30/23

## 2023-09-30 NOTE — TOC Progression Note (Addendum)
 Transition of Care [facility]) - Progression Note    Patient Details  Name: Marco [name] MRN: [id number] Date of Birth: 02-21-1934  Transition of Care [facility]) CM/SW Contact  Elspeth Hals, LCSW Phone Number: 09/30/2023, 12:04 PM  Clinical Narrative:   CSW spoke with son Alvia Jointer in room, bed offers provided on medicare choice document.  He will reivew.  1000: son asking about private rooms: [facility], [place], [place].  [facility] now full, [place] and [employer] [facility] do have private rooms.  Son will accept offer at [facility].  1200: Auth request submitted in [place] and approved: [id number], 3 days: 5/28-5/30.    1300: Son requesting change of facility from [facility] to [facility].  CSW spoke with [place] and she confirmed that she can receive pt today if [facility] can be transferred. CSW spoke with Navi: system down currently, will need to call back.  1430: CSW spoke with Navi, they are able to rebuild the [place] for [facility].  Will call back with details.  1505: TC Navi: updated auth info: [id number], 3 days: 5/28-5/30.    Expected Discharge Plan: Skilled Nursing Facility Barriers to Discharge: Continued Medical Work up, SNF Pending bed offer  Expected Discharge Plan and Services In-house Referral: Clinical Social Work   Post Acute Care Choice: Skilled Nursing Facility Living arrangements for the past 2 months: Single Family Home                                       Social Determinants of Health (SDOH) Interventions SDOH Screenings   Food Insecurity: No Food Insecurity (09/27/2023)  Housing: Low Risk  (09/27/2023)  Transportation Needs: No Transportation Needs (09/27/2023)  Utilities: Not At Risk (09/27/2023)  Alcohol  Screen: Low Risk  (09/04/2021)  Depression (PHQ2-9): Low Risk  (07/10/2023)  Financial Resource Strain: Low Risk  (10/30/2021)  Physical Activity: Inactive (10/25/2020)  Social Connections: Moderately Isolated (09/27/2023)  Stress: No  Stress Concern Present (09/04/2021)  Tobacco Use: Low Risk  (09/27/2023)    Readmission Risk Interventions     No data to display

## 2023-09-30 NOTE — Progress Notes (Signed)
 Patient picked up by PTAR to [facility], IV removed. Patient stable, vital signs normal.

## 2023-09-30 NOTE — Discharge Summary (Signed)
 Physician Discharge Summary  Marco [name]:[id number] DOB: 1934/03/16 DOA: 09/26/2023  PCP: Ezell Hollow, MD  Admit date: 09/26/2023 Discharge date: 09/30/2023  Time spent:45 minutes  Recommendations for Outpatient Follow-up:  Orthopedics Dr. Colette Davies in 1 week SNF for short-term rehab Follow-up with medical oncology for metastatic prostate cancer   Discharge Diagnoses:  Principal Problem:   Intertrochanteric fracture of left femur [facility]) Active Problems:   Prostate cancer metastatic to bone [facility])   Non-insulin  dependent type 2 diabetes mellitus (HCC)   Hyperlipidemia   Hypothyroidism   CKD (chronic kidney disease), stage III (HCC)   Fall at home, initial encounter   History of CAD (coronary artery disease)   Discharge Condition: Improved  Diet recommendation: Diabetic  Filed Weights   09/26/23 2243  Weight: 63.5 kg    History of present illness:  89 y.o. male with medical history significant of CAD, non-insulin -dependent DM type II, prostatic cancer with bony metastasis,  chronic sacral insufficiency fracture, CKD stage III and postherpetic neuralgia presented to emergency department complaining of mechanical fall and since the fall patient has noticing left-sided hip pain, limb shortening.  Patient was found to have hip fracture.  He was hospitalized for further management.   Hospital Course:   Left hip fracture Secondary to mechanical fall.  Patient seen by orthopedics.  Underwent surgery.  PT and OT eval.   -Will need to go to skilled nursing facility for short-term rehab. Started on aspirin  twice a day by orthopedics for DVT prophylaxis.     Acute blood loss anemia Drop in hemoglobin noted.  Most likely due to fracture and operative loss. Due to history of CAD patient was transfused 2 units of PRBC 2 days ago with improvement in hemoglobin. Anemia panel shows ferritin of 43, iron 45, TIBC 336, percent saturation 13.  Vitamin B12 level 1860.  Folic acid  15.8.  Constipation Secondary to narcotics and limited mobility, continue laxatives at discharge, can wean down as tolerated   Metastatic prostate cancer Has been followed by urology.  Has been on Xtandi .  Recent PET scan in April showed bony metastases to pelvis spine and ribs.  Apparently has been referred to medical oncology but has not been seen by them yet.   Diabetes mellitus type 2 Monitor CBGs.  Patient is on metformin  prior to admission.  Continue SSI.   Hyperlipidemia On statin prior to admission   Hypothyroidism Continue levothyroxine .   Chronic kidney disease stage IIIb Stable   Hyperkalemia Potassium level was noted to be elevated.  Apparently has a history of same and has required Lokelma  in the past.  Stable in the last 48 hours.     Coronary artery disease Stable.  Discharge Exam: Vitals:   09/30/23 0434 09/30/23 0839  BP: (!) 126/57 (!) 120/48  Pulse: 84 86  Resp: 16 18  Temp: 98.2 F (36.8 C) 98 F (36.7 C)  SpO2: 98% 97%   Gen: Awake, Alert, Oriented X 3,  HEENT: no JVD Lungs: Good air movement bilaterally, CTAB CVS: S1S2/RRR Abd: soft, Non tender, non distended, BS present Extremities: No edema Skin: no new rashes on exposed skin   Discharge Instructions    Allergies as of 09/30/2023       Reactions   Beef-derived Drug Products    vegetarian   Chicken Allergy    Fish Allergy    Pork-derived Products         Medication List     STOP taking these medications  nitroGLYCERIN  0.4 MG SL tablet Commonly known as: NITROSTAT    senna-docusate 8.6-50 MG tablet Commonly known as: Senokot-S       TAKE these medications    Accu-Chek Guide Control Liqd Use as directed   Accu-Chek Guide test strip Generic drug: glucose blood USE TO CHECK BLOOD SUGAR ONCE  DAILY   Accu-Chek Guide w/Device Kit Check blood sugars once daily   Accu-Chek Softclix Lancets lancets CHECK BLOOD SUGAR ONCE DAILY AS  DIRECTED   acetaminophen  500 MG  tablet Commonly known as: TYLENOL  Take 2 tablets (1,000 mg total) by mouth every 8 (eight) hours for 14 days.   Alcohol  Swabs  Pads Use to clean finger prior to testing blood sugars   alendronate  70 MG tablet Commonly known as: FOSAMAX  Take 1 tablet (70 mg total) by mouth every 7 (seven) days. Take with a full glass of water on an empty stomach.   aspirin  81 MG chewable tablet Chew 1 tablet (81 mg total) by mouth 2 (two) times daily.   atorvastatin  40 MG tablet Commonly known as: LIPITOR Take 1 tablet (40 mg total) by mouth at bedtime.   Calcium  500 + D 500-125 MG-UNIT tablet Generic drug: Calcium  Carb-Cholecalciferol Take 1 tablet by mouth daily.   levothyroxine  100 MCG tablet Commonly known as: SYNTHROID  TAKE 1 TABLET BY MOUTH DAILY  BEFORE BREAKFAST   Lokelma  10 g Pack packet Generic drug: sodium zirconium cyclosilicate  MIX 1 PACKET IN AT LEAST 3  TABLESPOONS OF WATER STIR AND  DRINK IMMEDIATELY EVERY OTHER  DAY   metFORMIN  500 MG tablet Commonly known as: GLUCOPHAGE  Take 1 tablet (500 mg total) by mouth daily with breakfast.   methocarbamol  500 MG tablet Commonly known as: ROBAXIN  Take 1 tablet (500 mg total) by mouth every 8 (eight) hours as needed for up to 10 days for muscle spasms.   MULTI-VITAMIN DAILY PO Take 1 tablet by mouth daily.   oxyCODONE  5 MG immediate release tablet Commonly known as: Oxy IR/ROXICODONE  Take 1 tablet (5 mg total) by mouth every 4 (four) hours as needed for up to 7 days for moderate pain (pain score 4-6) or severe pain (pain score 7-10).   polyethylene glycol 17 g packet Commonly known as: MiraLax  Take 17 g by mouth daily for 14 days.   senna 8.6 MG Tabs tablet Commonly known as: SENOKOT Take 1 tablet (8.6 mg total) by mouth 2 (two) times daily for 14 days.   VITAMIN B-12 PO Take 2,500 mcg by mouth daily.   Xtandi  40 MG tablet Generic drug: enzalutamide  Take 120 mg by mouth daily.       Allergies  Allergen Reactions    Beef-Derived Drug Products     vegetarian   Chicken Allergy    Fish Allergy    Pork-Derived Products       The results of significant diagnostics from this hospitalization (including imaging, microbiology, ancillary and laboratory) are listed below for reference.    Significant Diagnostic Studies: DG HIP UNILAT WITH PELVIS 1V LEFT Result Date: 09/27/2023 CLINICAL DATA:  Postop. EXAM: DG HIP (WITH OR WITHOUT PELVIS) 1V*L* COMPARISON:  Radiograph earlier today FINDINGS: Frogleg lateral view requested by the referring provider. Femoral intramedullary nail and trans trochanteric screw fixation of intertrochanteric femur fracture. Mild residual fracture displacement. No change in alignment from earlier today. Recent postsurgical change includes air and edema in the soft tissues with lateral skin staples in place. IMPRESSION: ORIF of intertrochanteric femur fracture. No change in alignment from earlier today. Electronically Signed  By: Chadwick Colonel M.D.   On: 09/27/2023 16:00   DG FEMUR MIN 2 VIEWS LEFT Result Date: 09/27/2023 CLINICAL DATA:  Postop. EXAM: LEFT FEMUR 2 VIEWS COMPARISON:  Preoperative imaging FINDINGS: Femoral intramedullary nail with trans trochanteric and distal locking screw fixation traverse proximal femur fracture. Improved fracture alignment from preoperative imaging. Recent postsurgical change includes air and edema in the soft tissues with multiple skin staples in place. IMPRESSION: ORIF of proximal femur fracture. Electronically Signed   By: Chadwick Colonel M.D.   On: 09/27/2023 15:28   DG FEMUR MIN 2 VIEWS LEFT Result Date: 09/27/2023 CLINICAL DATA:  [id number] Surgery, elective [id number] EXAM: LEFT FEMUR 2 VIEWS COMPARISON:  Sep 27, 2023 FINDINGS: Spot fluoroscopy images were obtained for surgical planning purposes. This demonstrates placement of an intramedullary rod. There is persistent displacement of the lesser trochanter. Time: 1 minute 47 seconds Dose: 15.85 mGy Please  reference procedure report for further details. IMPRESSION: Spot fluoroscopy images for surgical planning purposes. Electronically Signed   By: Clancy Crimes M.D.   On: 09/27/2023 12:19   DG C-Arm 1-60 Min-No Report Result Date: 09/27/2023 Fluoroscopy was utilized by the requesting physician.  No radiographic interpretation.   DG C-Arm 1-60 Min-No Report Result Date: 09/27/2023 Fluoroscopy was utilized by the requesting physician.  No radiographic interpretation.   DG FEMUR MIN 2 VIEWS LEFT Result Date: 09/27/2023 CLINICAL DATA:  Preop.  Pain. EXAM: LEFT FEMUR 2 VIEWS COMPARISON:  09/26/2023. FINDINGS: Intertrochanteric comminuted fracture proximal left femur. Lesser trochanter displaced several cm. Distal femur is intact. Marked tricompartmental degenerative changes noted at the left knee. IMPRESSION: Intertrochanteric fracture. Electronically Signed   By: Sydell Eva M.D.   On: 09/27/2023 09:40   DG HIP UNILAT W OR W/O PELVIS 2-3 VIEWS LEFT Result Date: 09/26/2023 CLINICAL DATA:  Status post fall. EXAM: DG HIP (WITH OR WITHOUT PELVIS) 2-3V LEFT COMPARISON:  None Available. FINDINGS: An acute mildly displaced fracture deformity is seen extending through the inter trochanteric region of the proximal left femur. There is no evidence of dislocation. A radiopaque intramedullary rod and compression screw device are seen within the proximal right femur. Mild degenerative changes are seen in the form of joint space narrowing and acetabular sclerosis. Additional degenerative changes are present within the visualized portion of the lower lumbar spine. IMPRESSION: Acute mildly displaced intertrochanteric fracture of the proximal left femur. Electronically Signed   By: Virgle Grime M.D.   On: 09/26/2023 23:12   DG Chest 1 View Result Date: 09/26/2023 CLINICAL DATA:  Status post fall. EXAM: CHEST  1 VIEW COMPARISON:  February 11, 2022 FINDINGS: The heart size and mediastinal contours are within  normal limits. There is moderate severity calcification of the aortic arch. Low lung volumes are noted. Mild atelectatic changes seen within the bilateral lung bases. There is no evidence of acute infiltrate, pleural effusion or pneumothorax. Multilevel degenerative changes seen throughout the thoracic spine. IMPRESSION: Low lung volumes without acute cardiopulmonary disease. Electronically Signed   By: Virgle Grime M.D.   On: 09/26/2023 23:10    Microbiology: Recent Results (from the past 240 hours)  Surgical pcr screen     Status: None   Collection Time: 09/27/23  5:32 AM   Specimen: Nasal Mucosa; Nasal Swab  Result Value Ref Range Status   MRSA, PCR NEGATIVE NEGATIVE Final   Staphylococcus aureus NEGATIVE NEGATIVE Final    Comment: (NOTE) The Xpert SA Assay (FDA approved for NASAL specimens in patients 61 years of age and  older), is one component of a comprehensive surveillance program. It is not intended to diagnose infection nor to guide or monitor treatment. Performed at [facility], [address], [place], [place] [address]      Labs: Basic Metabolic Panel: Recent Labs  Lab 09/27/23 0000 09/27/23 0518 09/27/23 1433 09/28/23 0602 09/29/23 0540 09/30/23 0459  NA 139 136  --  136 136 133*  K 4.9 5.6* 5.3* 4.7 4.3 4.2  CL 102 101  --  103 101 101  CO2 25 27  --  28 26 26   GLUCOSE 184* 186*  --  147* 152* 154*  BUN 23 22  --  20 20 23   CREATININE 1.45* 1.48*  --  1.50* 1.51* 1.36*  CALCIUM  10.0 9.8  --  8.8* 8.8* 8.3*   Liver Function Tests: Recent Labs  Lab 09/27/23 0000 09/27/23 0518  AST 24 21  ALT 12 13  ALKPHOS 63 56  BILITOT 0.7 0.6  PROT 6.4* 6.2*  ALBUMIN 3.3* 3.1*   No results for input(s): "LIPASE", "AMYLASE" in the last 168 hours. No results for input(s): "AMMONIA" in the last 168 hours. CBC: Recent Labs  Lab 09/27/23 0000 09/27/23 0518 09/28/23 0602 09/28/23 1959 09/29/23 0540 09/30/23 0459  WBC 7.9 7.5 6.8  --  6.7 7.1   NEUTROABS 5.8  --   --   --   --   --   HGB 10.3* 10.0* 7.3* 10.1* 9.7* 9.7*  HCT 32.2* 31.8* 22.9* 30.4* 29.5* 30.3*  MCV 86.6 87.4 85.1  --  85.3 87.6  PLT 212 201 139*  --  122* 142*   Cardiac Enzymes: No results for input(s): "CKTOTAL", "CKMB", "CKMBINDEX", "TROPONINI" in the last 168 hours. BNP: BNP (last 3 results) No results for input(s): "BNP" in the last 8760 hours.  ProBNP (last 3 results) No results for input(s): "PROBNP" in the last 8760 hours.  CBG: Recent Labs  Lab 09/28/23 2119 09/29/23 0610 09/29/23 1101 09/29/23 2135 09/30/23 0528  GLUCAP 178* 176* 224* 208* 167*       Signed:  Deforest Fast MD.  [facility] 09/30/2023, 9:30 AM

## 2023-09-30 NOTE — Plan of Care (Signed)
  Problem: Education: Goal: Knowledge of General Education information will improve Description: Including pain rating scale, medication(s)/side effects and non-pharmacologic comfort measures Outcome: Progressing   Problem: Activity: Goal: Risk for activity intolerance will decrease Outcome: Progressing   Problem: Elimination: Goal: Will not experience complications related to bowel motility Outcome: Progressing   Problem: Pain Managment: Goal: General experience of comfort will improve and/or be controlled Outcome: Progressing

## 2023-10-04 DIAGNOSIS — M4848XA Fatigue fracture of vertebra, sacral and sacrococcygeal region, initial encounter for fracture: Secondary | ICD-10-CM | POA: Diagnosis not present

## 2023-10-04 DIAGNOSIS — E875 Hyperkalemia: Secondary | ICD-10-CM | POA: Diagnosis not present

## 2023-10-04 DIAGNOSIS — M170 Bilateral primary osteoarthritis of knee: Secondary | ICD-10-CM | POA: Diagnosis not present

## 2023-10-04 DIAGNOSIS — B0229 Other postherpetic nervous system involvement: Secondary | ICD-10-CM | POA: Diagnosis not present

## 2023-10-04 DIAGNOSIS — M2580 Other specified joint disorders, unspecified joint: Secondary | ICD-10-CM | POA: Diagnosis not present

## 2023-10-04 DIAGNOSIS — E039 Hypothyroidism, unspecified: Secondary | ICD-10-CM | POA: Diagnosis not present

## 2023-10-04 DIAGNOSIS — R296 Repeated falls: Secondary | ICD-10-CM | POA: Diagnosis not present

## 2023-10-04 DIAGNOSIS — Z4889 Encounter for other specified surgical aftercare: Secondary | ICD-10-CM | POA: Diagnosis not present

## 2023-10-04 DIAGNOSIS — S72142D Displaced intertrochanteric fracture of left femur, subsequent encounter for closed fracture with routine healing: Secondary | ICD-10-CM | POA: Diagnosis not present

## 2023-10-04 DIAGNOSIS — M810 Age-related osteoporosis without current pathological fracture: Secondary | ICD-10-CM | POA: Diagnosis not present

## 2023-10-04 DIAGNOSIS — C7951 Secondary malignant neoplasm of bone: Secondary | ICD-10-CM | POA: Diagnosis not present

## 2023-10-04 DIAGNOSIS — Z8679 Personal history of other diseases of the circulatory system: Secondary | ICD-10-CM | POA: Diagnosis not present

## 2023-10-04 DIAGNOSIS — N1832 Chronic kidney disease, stage 3b: Secondary | ICD-10-CM | POA: Diagnosis not present

## 2023-10-04 DIAGNOSIS — D62 Acute posthemorrhagic anemia: Secondary | ICD-10-CM | POA: Diagnosis not present

## 2023-10-04 DIAGNOSIS — K5900 Constipation, unspecified: Secondary | ICD-10-CM | POA: Diagnosis not present

## 2023-10-04 DIAGNOSIS — E119 Type 2 diabetes mellitus without complications: Secondary | ICD-10-CM | POA: Diagnosis not present

## 2023-10-04 DIAGNOSIS — E785 Hyperlipidemia, unspecified: Secondary | ICD-10-CM | POA: Diagnosis not present

## 2023-10-06 ENCOUNTER — Telehealth: Payer: Self-pay | Admitting: Genetic Counselor

## 2023-10-06 ENCOUNTER — Encounter: Payer: Self-pay | Admitting: Genetic Counselor

## 2023-10-06 DIAGNOSIS — Z1379 Encounter for other screening for genetic and chromosomal anomalies: Secondary | ICD-10-CM | POA: Insufficient documentation

## 2023-10-06 NOTE — Telephone Encounter (Signed)
 Contacted patient in attempt to disclose results of genetic testing.  LVM with contact information requesting a call back.

## 2023-10-08 ENCOUNTER — Telehealth: Payer: Self-pay | Admitting: Genetic Counselor

## 2023-10-08 ENCOUNTER — Ambulatory Visit: Payer: Self-pay | Admitting: Genetic Counselor

## 2023-10-08 DIAGNOSIS — Z1379 Encounter for other screening for genetic and chromosomal anomalies: Secondary | ICD-10-CM

## 2023-10-08 DIAGNOSIS — Z800 Family history of malignant neoplasm of digestive organs: Secondary | ICD-10-CM

## 2023-10-08 DIAGNOSIS — C61 Malignant neoplasm of prostate: Secondary | ICD-10-CM

## 2023-10-08 NOTE — Telephone Encounter (Signed)
 Second attempt.  Contacted patient in attempt to disclose results of genetic testing.  LVM with contact information requesting a call back.  Told him we would send results via MyChart.

## 2023-10-08 NOTE — Progress Notes (Signed)
 HPI:   [name] was previously seen in the [name] due to a personal history of metastatic prostate cancer and concerns regarding a hereditary predisposition to cancer.    [name] recent genetic test results were disclosed to him by MyChart after being unable to reach him by telephone. These results and recommendations are discussed in more detail below.  CANCER HISTORY:  In 2023, at the age of 89, [name] was diagnosed with metastatic prostate cancer.        FAMILY HISTORY:  We obtained a detailed, 4-generation family history.  Significant diagnoses are listed below:      Family History  Problem Relation Age of Onset   Colon cancer Nephew 35       [name] is unaware of previous family history of genetic testing for hereditary cancer risks. Other relatives are unavailable for genetic testing at this time.    Patient's maternal ancestors are of [place] descent, and paternal ancestors are of [place] descent. There is no reported Ashkenazi Jewish ancestry.   GENETIC TEST RESULTS:  The [facility]-Cancer +RNA Panel found no pathogenic mutations.   The [facility] CustomNext-Cancer +RNAinsight Panel (Medicare Core + prostate genes + GI genes) includes sequencing, deletion/duplication, and RNA analysis for the following 28 genes: APC, ATM, BMPR1A, BRCA1, BRCA2, CDH1, CHEK2, MLH1, MSH2, MSH6, MUTYH, NTHL1, PALB2, PMS2, PTEN, RPS20, SMAD4, STK11 and TP53 (sequencing and deletion/duplication); AXIN2, CTNNA1, HOXB13, MBD4, MSH3, POLD1 and POLE (sequencing only); EPCAM and GREM1 (deletion/duplication only).  The test report has been scanned into EPIC and is located under the Molecular Pathology section of the Results Review tab.  A portion of the result report is included below for reference. Genetic testing reported out on 09/26/2023.      Even though a pathogenic variant was not identified, possible explanations for the cancer in the family may include: There  may be no hereditary risk for cancer in the family. The cancers in [name] and/or his family may be sporadic/familial or due to other genetic and environmental factors.  Most cancer is not hereditary.  There may be a gene mutation in one of these genes that current testing methods cannot detect but that chance is small. There could be another gene that has not yet been discovered, or that we have not yet tested, that is responsible for the cancer diagnoses in the family.  It is also possible there is a hereditary cause for the cancer in the family that [name] did not inherit.   Therefore, it is important to remain in touch with cancer genetics in the future so that we can continue to offer [name] the most up to date genetic testing.    ADDITIONAL GENETIC TESTING:  There are other genes that are associated with increased cancer risk that can be analyzed (beyond the cancers seen in the family). Should [name] wish to pursue additional genetic testing, we are happy to discuss and coordinate this testing, at any time.  CANCER SCREENING RECOMMENDATIONS:  [name] test result is considered negative (normal).  This means that we have not identified a hereditary cause for his personal history of prostate cancer at this time.   An individual's cancer risk and medical management are not determined by genetic test results alone. Overall cancer risk assessment incorporates additional factors, including personal medical history, family history, and any available genetic information that may result in a personalized plan for cancer prevention and surveillance. Therefore, it is recommended  he continue to follow the cancer management and screening guidelines provided by his oncology and primary healthcare provider.  RECOMMENDATIONS FOR FAMILY MEMBERS:   Since he did not inherit a identifiable mutation in a cancer predisposition gene included on this panel, his children could not have inherited a known  mutation from him in one of these genes. Individuals in this family might be at some increased risk of developing cancer, over the general population risk, due to the family history of cancer.  Individuals in the family should notify their providers of the family history of cancer. Male relatives should speak with their providers about prostate cancer screening.   First degree relatives of those with colon cancer should receive colonoscopies beginning at age 29, or 10 years prior to the earliest diagnosis of colon cancer in the family, and receive colonoscopies at least every 5 years, or as recommended by their gastroenterologist.   Other members of the family may still carry a pathogenic variant in one of these genes that [name] did not inherit. Based on the family history, we recommend his nephew, who was diagnosed with colon cancer at age 55, have genetic counseling and testing. [name] can let us  know if we can be of any assistance in coordinating genetic counseling and/or testing for these family member.     FOLLOW-UP:  Cancer genetics is a rapidly advancing field and it is possible that new genetic tests will be appropriate for him and/or his family members in the future. We encourage [name] to remain in contact with cancer genetics, so we can update his personal and family histories and let him know of advances in cancer genetics that may benefit this family.   Our contact number was provided.  They are welcome to call us  at anytime with additional questions or concerns.   Marco [name] M. Ora Billing, MS, [name] [email or web address] (P) [phone]

## 2023-10-09 ENCOUNTER — Other Ambulatory Visit: Payer: Self-pay | Admitting: Internal Medicine

## 2023-10-14 ENCOUNTER — Other Ambulatory Visit (INDEPENDENT_AMBULATORY_CARE_PROVIDER_SITE_OTHER)

## 2023-10-14 ENCOUNTER — Ambulatory Visit (INDEPENDENT_AMBULATORY_CARE_PROVIDER_SITE_OTHER): Admitting: Orthopedic Surgery

## 2023-10-14 DIAGNOSIS — S72142D Displaced intertrochanteric fracture of left femur, subsequent encounter for closed fracture with routine healing: Secondary | ICD-10-CM

## 2023-10-14 NOTE — Progress Notes (Signed)
 Orthopedic Surgery Post-operative Office Visit  Procedure: left intertrochanteric femur fracture CMN Date of Surgery: 09/27/2023 (~2 weeks post-op)  Assessment: Patient is a 89 y.o. who is still having pain and limited ambulation after surgery   Plan: -Operative plans complete -Staples removed in office today -Okay to let soap/water run over incisions but do not submerge -Encouraged him to continue to encourage him to continue to push himself and work on ambulating longer distances -Continue to work with PT -DVT ppx: ASA 81mg  BID -Weightbearing as tolerated LLE -Pain management: weaning oxycodone , tylenol  -Return to office in 4 weeks, x-rays needed at next visit: AP/lateral left femur  ___________________________________________________________________________   Subjective: Patient was discharged to a SNF after his hospitalization.  He has been working with physical therapy at the facility.  He has been doing sit to stand and walking short distances with a walker.  He has not done more than short walks around his room.  He is still having pain in the hip but it has gotten better since he was in the hospital.  He said he is able to sit longer than he was when he was in the hospital.  Has not noticed any redness or drainage around his incisions.  Objective:  General: no acute distress, appropriate affect Neurologic: alert, answering questions appropriately, following commands Respiratory: unlabored breathing on room air Skin: incision are well-approximated with no erythema, induration, active/expressible drainage  MSK (LLE): EHL/TA/GSC intact, sensation intact to light touch in sural/saphenous/deep peroneal/superficial peroneal/tibial nerve distributions, foot warm and well-perfused, palpable DP pulse, ambulates short distances with walker, able to perform sit to stand independently  Imaging: X-rays of the left femur from 10/14/2023 were independently reviewed and interpreted, showing  a long cephalomedullary rod in place.  There is a displaced intertrochanteric femur fracture.  No change in alignment since immediate postoperative films on 09/27/2023.  No callus formation seen.  No lucency seen around the lag screws or the interlocking screws.  No new fracture seen.   Patient name: Marco [name] Patient MRN: [id number] Date of visit: 10/14/23

## 2023-10-15 ENCOUNTER — Other Ambulatory Visit: Payer: Self-pay

## 2023-10-15 MED ORDER — ALENDRONATE SODIUM 70 MG PO TABS: 70.0000 mg | ORAL_TABLET | ORAL | 3 refills | Status: DC

## 2023-10-19 DIAGNOSIS — C7951 Secondary malignant neoplasm of bone: Secondary | ICD-10-CM | POA: Diagnosis not present

## 2023-10-19 DIAGNOSIS — N189 Chronic kidney disease, unspecified: Secondary | ICD-10-CM | POA: Diagnosis not present

## 2023-10-19 DIAGNOSIS — Z9181 History of falling: Secondary | ICD-10-CM | POA: Diagnosis not present

## 2023-10-19 DIAGNOSIS — Z7984 Long term (current) use of oral hypoglycemic drugs: Secondary | ICD-10-CM | POA: Diagnosis not present

## 2023-10-19 DIAGNOSIS — D631 Anemia in chronic kidney disease: Secondary | ICD-10-CM | POA: Diagnosis not present

## 2023-10-19 DIAGNOSIS — S72302D Unspecified fracture of shaft of left femur, subsequent encounter for closed fracture with routine healing: Secondary | ICD-10-CM | POA: Diagnosis not present

## 2023-10-19 DIAGNOSIS — E1122 Type 2 diabetes mellitus with diabetic chronic kidney disease: Secondary | ICD-10-CM | POA: Diagnosis not present

## 2023-10-20 ENCOUNTER — Ambulatory Visit

## 2023-10-20 ENCOUNTER — Other Ambulatory Visit

## 2023-10-21 DIAGNOSIS — E785 Hyperlipidemia, unspecified: Secondary | ICD-10-CM | POA: Diagnosis not present

## 2023-10-21 DIAGNOSIS — Z9181 History of falling: Secondary | ICD-10-CM | POA: Diagnosis not present

## 2023-10-21 DIAGNOSIS — N183 Chronic kidney disease, stage 3 unspecified: Secondary | ICD-10-CM | POA: Diagnosis not present

## 2023-10-21 DIAGNOSIS — C7951 Secondary malignant neoplasm of bone: Secondary | ICD-10-CM | POA: Diagnosis not present

## 2023-10-21 DIAGNOSIS — Z7984 Long term (current) use of oral hypoglycemic drugs: Secondary | ICD-10-CM | POA: Diagnosis not present

## 2023-10-21 DIAGNOSIS — D631 Anemia in chronic kidney disease: Secondary | ICD-10-CM | POA: Diagnosis not present

## 2023-10-21 DIAGNOSIS — E039 Hypothyroidism, unspecified: Secondary | ICD-10-CM | POA: Diagnosis not present

## 2023-10-21 DIAGNOSIS — E1122 Type 2 diabetes mellitus with diabetic chronic kidney disease: Secondary | ICD-10-CM | POA: Diagnosis not present

## 2023-10-21 DIAGNOSIS — S72302D Unspecified fracture of shaft of left femur, subsequent encounter for closed fracture with routine healing: Secondary | ICD-10-CM | POA: Diagnosis not present

## 2023-10-22 ENCOUNTER — Telehealth: Payer: Self-pay

## 2023-10-22 ENCOUNTER — Ambulatory Visit

## 2023-10-22 NOTE — Telephone Encounter (Signed)
Contacted patient on preferred number listed in notes for scheduled AWV. Patient stated unable to complete visit today will call back to reschedule.

## 2023-10-23 DIAGNOSIS — S72142D Displaced intertrochanteric fracture of left femur, subsequent encounter for closed fracture with routine healing: Secondary | ICD-10-CM | POA: Diagnosis not present

## 2023-10-23 DIAGNOSIS — I10 Essential (primary) hypertension: Secondary | ICD-10-CM | POA: Diagnosis not present

## 2023-10-23 DIAGNOSIS — C7951 Secondary malignant neoplasm of bone: Secondary | ICD-10-CM | POA: Diagnosis not present

## 2023-10-23 DIAGNOSIS — M4848XA Fatigue fracture of vertebra, sacral and sacrococcygeal region, initial encounter for fracture: Secondary | ICD-10-CM | POA: Diagnosis not present

## 2023-10-23 DIAGNOSIS — M170 Bilateral primary osteoarthritis of knee: Secondary | ICD-10-CM | POA: Diagnosis not present

## 2023-10-27 NOTE — Progress Notes (Addendum)
 Addendum Tempus NGS result. No actionable mutation from Xf 523 gene liquid biopsy. FOXA1 inframe deletion TP53 missense variant bTMB 5.5 m/Mb Various VUS  [facility] CONSULT NOTE  Patient Care Team: Marco Aloysius BRAVO, MD as PCP - General (Internal Medicine) Marco Ozell BRAVO, MD as Referring Physician (Ophthalmology) Marco [name], RPH-CPP (Pharmacist) Marco Donnice SAUNDERS, MD as Consulting Physician (Urology) Marco Hoes, MD as Consulting Physician (Nephrology)  ASSESSMENT & PLAN:  Marco [name] is a 89 y.o.[name] with history of mCRPC referred by Dr. Donnice Marco to Medical Oncology Clinic for prostate cancer.  Initially presented to mCRPC with bone and lymph node metastases.  PSA 690 from December 2022.  Biopsy showed GG5 GS 4+5=9 in 8/12 cores.  Patient was placed on ADT with enzalutamide .  PSA nadir 8.36 in 07/2022. PSA was 13.3 in Nov 2024, 35.6 in 06/26/23. T<3. PSADT was about 2 months. PSMA PET from 08/06/23 showed intense radiotracer avid skeletal metastasis. Lesions are too numerous to count within the pelvis, spine and ribs. Two liver metastases and multiple nodal metastases.  He recently had left intertrochanteric fracture intramedullary rodding. Clinically recovering well.  Current diagnosis: mCRCP Initial diagnosis: mHSPC  Germline testing: Negative [facility] CustomNext-Cancer +RNAinsight (prostate + GI genes) Panel.  Somatic testing: will request somatic testing. Treatment: ADT/ARPI Last Eligard 45 mg on 07/08/23.  The patient was counseled on the natural history of prostate cancer and the standard treatment options that are available for prostate cancer.   Discussed current available treatment of mCRPC. All treatment are palliative and not curative. Discussed options. He is not a good candidate for cytotoxic chemotherapy at this time. He is getting better from recent fracture. Discussed Pluvicto, potential side effects, survival data. He and his son expressed understanding and  would like to proceed.  Will need bone density and reported on fosamax . May continue until starts zometa  before next Pluvicto.  Will give iv iron  for IDA. Assessment & Plan Prostate cancer metastatic to bone (HCC) Recommend Pluvicto Will stop Xtandi  once starts Pluvicto NGS testing Baseline PSA, T  Palliative care referral for ACP At risk for side effect of medication Supportive bone mineral density study calcium  (1000-1200 mg daily from food and supplements) and vitamin D3 (1000 IU daily) Continue Fosamax  until starts Zometa  4 mg every 3 months Healthy lifestyle to prevent diabetes and CV disease Aggressive cardiovascular risk management Continue PT History of fracture bone mineral density study calcium  (1000-1200 mg daily from food and supplements) and vitamin D3 (1000 IU daily) Continue Fosamax  until starts Zometa  4 mg every 3 months Advance care planning Discussed palliative treatment for mCRPC Discussed important to discuss advanced care planning. Referral to palliative care Normocytic anemia From low iron  and malignancy Other iron  deficiency anemia Iv iron  feraheme  ordered.  Patient instruction printed and given to the son in the room. Marco [name] our NN met with the patient.  Orders Placed This Encounter  Procedures   NM Radiologist Eval And Mgmt    Standing Status:   Future    Expected Date:   11/06/2023    Expiration Date:   10/29/2024    Reason for Exam (SYMPTOM  OR DIAGNOSIS REQUIRED):   mCRPC for Pluvicto    If indicated for the ordered procedure, I authorize the administration of a radiopharmaceutical per Radiology protocol:   Yes    Preferred imaging location?:   [facility]   NM PET (PSMA) SKULL TO MID THIGH    Standing Status:   Future    Expected  Date:   11/06/2023    Expiration Date:   10/29/2024    If indicated for the ordered procedure, I authorize the administration of a radiopharmaceutical per Radiology protocol:   Yes    Preferred imaging location?:    [name]   DG Bone Density    Standing Status:   Future    Expected Date:   11/29/2023    Expiration Date:   10/29/2024    Reason for Exam (SYMPTOM  OR DIAGNOSIS REQUIRED):   bone density for fx and on ADT    Preferred imaging location?:   [facility]   CBC with Differential (Cancer Center Only)    Standing Status:   Future    Number of Occurrences:   1    Expiration Date:   10/29/2024   CMP (Cancer Center only)    Standing Status:   Future    Number of Occurrences:   1    Expiration Date:   10/29/2024   Prostate-Specific AG, Serum    Standing Status:   Future    Number of Occurrences:   1    Expiration Date:   10/29/2024   Testosterone     Standing Status:   Future    Number of Occurrences:   1    Expiration Date:   10/29/2024   CBC with Differential (Cancer Center Only)    Standing Status:   Future    Expiration Date:   10/29/2024   CMP (Cancer Center only)    Standing Status:   Future    Expiration Date:   10/29/2024   Prostate-Specific AG, Serum    Standing Status:   Future    Expiration Date:   10/29/2024   Testosterone     Standing Status:   Future    Expiration Date:   10/29/2024   Lactate dehydrogenase    Standing Status:   Future    Expiration Date:   10/29/2024   Amb Referral to Palliative Care    Referral Priority:   Routine    Referral Type:   Consultation    Referral Reason:   Advance Care Planning    Number of Visits Requested:   1   Sample to Blood Bank    Standing Status:   Future    Expiration Date:   10/29/2024    The total time spent in the appointment was 60 minutes encounter with patients including review of chart and various tests results, discussions about plan of care and coordination of care plan  All questions were answered. The patient knows to call the clinic with any problems, questions or concerns. No barriers to learning was detected.  Marco JAYSON Chihuahua, MD 6/27/20252:29 PM  CHIEF COMPLAINTS/PURPOSE OF CONSULTATION:  Prostate  cancer  HISTORY OF PRESENTING ILLNESS:  Marco [name] 89 y.o. [name] is here because of prostate cancer. I have reviewed his chart and materials related to his cancer extensively and collaborated history with the patient. Summary of oncologic history is as follows: Oncology History  Prostate cancer metastatic to bone [facility])  08/22/2011 Initial Diagnosis   Prostate cancer metastatic to bone [facility])  PSA 17 with right prostate nodule.   04/09/2021 Tumor Marker   PSA 690   05/10/2021 Pathology Results   TRUS GG5 GS 4+5=9 in 8/12 cores.   05/17/2021 Imaging   MRI ABD 1. Infiltrative enhancing 4.4 cm mass centered in the right side of the prostate gland which extends beyond the prostatic capsule both posterolaterally and at the apex  with involvement of the right seminal vesicle, and possible involvement of the anterior rectal wall as well as the inferior bladder wall. 2. Right iliac chain, right pelvic sidewall, and retroperitoneal adenopathy, consistent with metastatic disease. 3. Enhancing T2 hyperintense lesions in the spine and pelvic girdle, suspicious for metastatic disease. 4. Right hydroureteronephrosis to the level of pelvic sidewall/iliac chain adenopathy. 5. Bilateral cystic renal lesions, with the left renal lesion demonstrating some internal septations which almost certainly reflects a Bosniak classification 2 or at most 7f renal cyst but is incompletely evaluated without intravenous contrast material. Suggest attention on follow-up oncologic imaging.   05/28/2021 -  Chemotherapy   Started degarelix, followed by Eligard and Xtandi .   06/18/2021 Imaging   BONE SCAN IMPRESSION: 1. Decreased right renal function. This is most likely related to renal atrophy and hydronephrosis previously identified on MRI of 05/17/2021. Left renal function and excretion appears normal. Photopenic area noted in the left kidney consistent with previously identified cyst.   2. Focal areas  of increased activity noted over the thoracic and lumbar spine most consistent with metastatic disease. Subtle punctate area of increased activity noted over the medial aspect of a right upper posterior rib is also suspicious for metastatic disease.   3. Increased activity noted over the right hip may be related to prior postsurgical change. If loosening or infection is a concern right hip series suggested for further evaluation.   10/14/2022 Imaging   BONE SCAN 1. Mildly decreased but persistent uptake at previously described sites of osseous metastatic disease of the thoracolumbar spine. Newly conspicuous increased radiotracer uptake within the sacrum and RIGHT sacroiliac joint are favored to reflect newly conspicuous sites of osseous metastatic disease. 2. Focal increased radiotracer uptake in a linear distribution along the costochondral junctions anteriorly are favored posttraumatic in etiology. Recommend attention on follow-up. 3. Additional favored degenerative changes as above.   01/2023 Tumor Marker   PSA 8.51   03/2023 Tumor Marker   PSA 13.3   06/2023 Tumor Marker   PSA 35.6   08/06/2023 PET scan   PSMA PET 1. Intense radiotracer avid skeletal metastasis. Lesions are too numerous to count within the pelvis, spine and ribs. 2. Two radiotracer avid hepatic metastasis. 3. Radiotracer avid LEFT supraclavicular, LEFT mediastinal, periaortic retroperitoneal and RIGHT iliac lymph nodes consistent with nodal metastasis. 4. No evidence of local prostate carcinoma recurrence in the prostate gland.     MEDICAL HISTORY:  Past Medical History:  Diagnosis Date   Amaurosis fugax 2017   Diabetes mellitus    DJD (degenerative joint disease) 08/25/2013   Elevated PSA    Other and unspecified hyperlipidemia 04/25/2013   Prostate cancer [facility])     SURGICAL HISTORY: Past Surgical History:  Procedure Laterality Date   CATARACT EXTRACTION     Bilaterally   FRACTURE SURGERY Right     rod 2020   INTRAMEDULLARY (IM) NAIL INTERTROCHANTERIC Left 09/27/2023   Procedure: FIXATION, FRACTURE, INTERTROCHANTERIC, WITH INTRAMEDULLARY ROD;  Surgeon: Georgina Ozell LABOR, MD;  Location: [facility] OR;  Service: Orthopedics;  Laterality: Left;   TYMPANIC MEMBRANE REPAIR     R    SOCIAL HISTORY: Social History   Socioeconomic History   Marital status: Legally Separated    Spouse name: Not on file   Number of children: 3   Years of education: Not on file   Highest education level: Not on file  Occupational History   Occupation: Retired  Tobacco Use   Smoking status: Never   Smokeless  tobacco: Never  Vaping Use   Vaping status: Never Used  Substance and Sexual Activity   Alcohol  use: No   Drug use: No   Sexual activity: Not on file  Other Topics Concern   Not on file  Social History Narrative   Moved to the [place] area 2012     Original from [place], separated   Lives w/ his son's family    2 daughters, 1 son     Social Drivers of Corporate investment banker Strain: Low Risk  (10/30/2021)   Overall Financial Resource Strain (CARDIA)    Difficulty of Paying Living Expenses: Not very hard  Food Insecurity: No Food Insecurity (10/30/2023)   Hunger Vital Sign    Worried About Running Out of Food in the Last Year: Never true    Ran Out of Food in the Last Year: Never true  Transportation Needs: No Transportation Needs (10/30/2023)   PRAPARE - Administrator, Civil Service (Medical): No    Lack of Transportation (Non-Medical): No  Physical Activity: Inactive (10/25/2020)   Exercise Vital Sign    Days of Exercise per Week: 0 days    Minutes of Exercise per Session: 0 min  Stress: No Stress Concern Present (09/04/2021)   [employer] of Occupational Health - Occupational Stress Questionnaire    Feeling of Stress : Not at all  Social Connections: Moderately Isolated (09/27/2023)   Social Connection and Isolation Panel    Frequency of Communication with Friends and  Family: More than three times a week    Frequency of Social Gatherings with Friends and Family: More than three times a week    Attends Religious Services: 1 to 4 times per year    Active Member of [employer] or Organizations: No    Attends Banker Meetings: Never    Marital Status: Widowed  Intimate Partner Violence: Not At Risk (10/30/2023)   Humiliation, Afraid, Rape, and Kick questionnaire    Fear of Current or Ex-Partner: No    Emotionally Abused: No    Physically Abused: No    Sexually Abused: No    FAMILY HISTORY: Family History  Problem Relation Age of Onset   Other Mother        Unknown    Other Father        Unknown   Colon cancer Nephew 35   Diabetes Neg Hx    CAD Neg Hx     ALLERGIES:  is allergic to beef-derived drug products, chicken allergy, fish allergy, and pork-derived products.  MEDICATIONS:  Current Outpatient Medications  Medication Sig Dispense Refill   Accu-Chek Softclix Lancets lancets CHECK BLOOD SUGAR ONCE DAILY AS  DIRECTED 100 each 12   Alcohol  Swabs  PADS Use to clean finger prior to testing blood sugars 100 each 1   alendronate  (FOSAMAX ) 70 MG tablet Take 1 tablet (70 mg total) by mouth once a week. Take with full glass of water on empty stomach 12 tablet 3   aspirin  81 MG chewable tablet Chew 1 tablet (81 mg total) by mouth 2 (two) times daily. 84 tablet 0   atorvastatin  (LIPITOR) 40 MG tablet Take 1 tablet (40 mg total) by mouth at bedtime. 90 tablet 1   Blood Glucose Calibration (ACCU-CHEK GUIDE CONTROL) LIQD Use as directed 1 each 3   Blood Glucose Monitoring Suppl (ACCU-CHEK GUIDE) w/Device KIT Check blood sugars once daily 1 kit 0   Calcium  Carb-Cholecalciferol (CALCIUM  500 + D) 500-125 MG-UNIT TABS Take  1 tablet by mouth daily.     Cyanocobalamin  (VITAMIN B-12 PO) Take 2,500 mcg by mouth daily.     glucose blood (ACCU-CHEK GUIDE) test strip USE TO CHECK BLOOD SUGAR ONCE  DAILY 100 strip 12   levothyroxine  (SYNTHROID ) 100 MCG tablet  TAKE 1 TABLET BY MOUTH DAILY  BEFORE BREAKFAST 100 tablet 2   metFORMIN  (GLUCOPHAGE ) 500 MG tablet Take 1 tablet (500 mg total) by mouth daily with breakfast. 90 tablet 1   Multiple Vitamin (MULTI-VITAMIN DAILY PO) Take 1 tablet by mouth daily.     sodium zirconium cyclosilicate  (LOKELMA ) 10 g PACK packet MIX 1 PACKET IN AT LEAST 3  TABLESPOONS OF WATER STIR AND  DRINK IMMEDIATELY EVERY OTHER  DAY 90 each 0   XTANDI  40 MG tablet Take 120 mg by mouth daily.     No current facility-administered medications for this visit.    REVIEW OF SYSTEMS:   All relevant systems were reviewed with the patient and are negative.  PHYSICAL EXAMINATION: ECOG PERFORMANCE STATUS: 2 - Symptomatic, <50% confined to bed  Vitals:   10/30/23 1105  BP: 131/67  Pulse: 75  Resp: 20  Temp: (!) 97.3 F (36.3 C)  SpO2: 99%   Filed Weights   10/30/23 1105  Weight: 138 lb 8 oz (62.8 kg)    GENERAL: alert, no distress and comfortable on wheel chair EYES: sclera clear LUNGS: Effort normal, no respiratory distress.  Clear to auscultation bilaterally HEART: regular rate & rhythm and no lower extremity edema ABDOMEN: soft, non-tender and nondistended Musculoskeletal: no point tenderness  LABORATORY DATA:  I have reviewed the results of PSA.  RADIOGRAPHIC STUDIES: I have personally reviewed the radiological images as listed and agreed with the findings in the report.

## 2023-10-28 ENCOUNTER — Other Ambulatory Visit: Payer: Self-pay | Admitting: Internal Medicine

## 2023-10-29 ENCOUNTER — Inpatient Hospital Stay: Admitting: Family Medicine

## 2023-10-29 DIAGNOSIS — N189 Chronic kidney disease, unspecified: Secondary | ICD-10-CM | POA: Diagnosis not present

## 2023-10-29 DIAGNOSIS — S72302D Unspecified fracture of shaft of left femur, subsequent encounter for closed fracture with routine healing: Secondary | ICD-10-CM | POA: Diagnosis not present

## 2023-10-29 DIAGNOSIS — I2510 Atherosclerotic heart disease of native coronary artery without angina pectoris: Secondary | ICD-10-CM | POA: Diagnosis not present

## 2023-10-29 DIAGNOSIS — E785 Hyperlipidemia, unspecified: Secondary | ICD-10-CM | POA: Diagnosis not present

## 2023-10-29 DIAGNOSIS — Z9181 History of falling: Secondary | ICD-10-CM | POA: Diagnosis not present

## 2023-10-29 DIAGNOSIS — Z7984 Long term (current) use of oral hypoglycemic drugs: Secondary | ICD-10-CM | POA: Diagnosis not present

## 2023-10-29 DIAGNOSIS — E1122 Type 2 diabetes mellitus with diabetic chronic kidney disease: Secondary | ICD-10-CM | POA: Diagnosis not present

## 2023-10-29 DIAGNOSIS — B0229 Other postherpetic nervous system involvement: Secondary | ICD-10-CM | POA: Diagnosis not present

## 2023-10-29 DIAGNOSIS — D630 Anemia in neoplastic disease: Secondary | ICD-10-CM | POA: Diagnosis not present

## 2023-10-29 DIAGNOSIS — C7951 Secondary malignant neoplasm of bone: Secondary | ICD-10-CM | POA: Diagnosis not present

## 2023-10-29 NOTE — Telephone Encounter (Signed)
 Refill request for Lokelma , okay to refill?

## 2023-10-30 ENCOUNTER — Other Ambulatory Visit: Payer: Self-pay

## 2023-10-30 ENCOUNTER — Inpatient Hospital Stay: Attending: Internal Medicine

## 2023-10-30 ENCOUNTER — Telehealth: Payer: Self-pay

## 2023-10-30 VITALS — BP 131/67 | HR 75 | Temp 97.3°F | Resp 20 | Wt 138.5 lb

## 2023-10-30 DIAGNOSIS — Z79890 Hormone replacement therapy (postmenopausal): Secondary | ICD-10-CM | POA: Insufficient documentation

## 2023-10-30 DIAGNOSIS — D649 Anemia, unspecified: Secondary | ICD-10-CM | POA: Diagnosis not present

## 2023-10-30 DIAGNOSIS — C787 Secondary malignant neoplasm of liver and intrahepatic bile duct: Secondary | ICD-10-CM | POA: Insufficient documentation

## 2023-10-30 DIAGNOSIS — D509 Iron deficiency anemia, unspecified: Secondary | ICD-10-CM | POA: Diagnosis not present

## 2023-10-30 DIAGNOSIS — M1990 Unspecified osteoarthritis, unspecified site: Secondary | ICD-10-CM | POA: Diagnosis not present

## 2023-10-30 DIAGNOSIS — C61 Malignant neoplasm of prostate: Secondary | ICD-10-CM | POA: Diagnosis not present

## 2023-10-30 DIAGNOSIS — Z9189 Other specified personal risk factors, not elsewhere classified: Secondary | ICD-10-CM

## 2023-10-30 DIAGNOSIS — E119 Type 2 diabetes mellitus without complications: Secondary | ICD-10-CM | POA: Diagnosis not present

## 2023-10-30 DIAGNOSIS — E785 Hyperlipidemia, unspecified: Secondary | ICD-10-CM | POA: Diagnosis not present

## 2023-10-30 DIAGNOSIS — Z7982 Long term (current) use of aspirin: Secondary | ICD-10-CM | POA: Insufficient documentation

## 2023-10-30 DIAGNOSIS — N1832 Chronic kidney disease, stage 3b: Secondary | ICD-10-CM | POA: Diagnosis not present

## 2023-10-30 DIAGNOSIS — Z8781 Personal history of (healed) traumatic fracture: Secondary | ICD-10-CM

## 2023-10-30 DIAGNOSIS — Z7189 Other specified counseling: Secondary | ICD-10-CM | POA: Diagnosis not present

## 2023-10-30 DIAGNOSIS — N402 Nodular prostate without lower urinary tract symptoms: Secondary | ICD-10-CM

## 2023-10-30 DIAGNOSIS — C7951 Secondary malignant neoplasm of bone: Secondary | ICD-10-CM | POA: Diagnosis not present

## 2023-10-30 DIAGNOSIS — Z7984 Long term (current) use of oral hypoglycemic drugs: Secondary | ICD-10-CM | POA: Insufficient documentation

## 2023-10-30 DIAGNOSIS — D508 Other iron deficiency anemias: Secondary | ICD-10-CM | POA: Diagnosis not present

## 2023-10-30 DIAGNOSIS — Z79899 Other long term (current) drug therapy: Secondary | ICD-10-CM | POA: Insufficient documentation

## 2023-10-30 LAB — CMP (CANCER CENTER ONLY)
ALT: 11 U/L (ref 0–44)
AST: 26 U/L (ref 15–41)
Albumin: 3.8 g/dL (ref 3.5–5.0)
Alkaline Phosphatase: 144 U/L — ABNORMAL HIGH (ref 38–126)
Anion gap: 8 (ref 5–15)
BUN: 21 mg/dL (ref 8–23)
CO2: 25 mmol/L (ref 22–32)
Calcium: 9.5 mg/dL (ref 8.9–10.3)
Chloride: 103 mmol/L (ref 98–111)
Creatinine: 1.43 mg/dL — ABNORMAL HIGH (ref 0.61–1.24)
GFR, Estimated: 47 mL/min — ABNORMAL LOW (ref >60)
Glucose, Bld: 111 mg/dL — ABNORMAL HIGH (ref 70–99)
Potassium: 5.3 mmol/L — ABNORMAL HIGH (ref 3.5–5.1)
Sodium: 136 mmol/L (ref 135–145)
Total Bilirubin: 0.6 mg/dL (ref 0.0–1.2)
Total Protein: 7.1 g/dL (ref 6.5–8.1)

## 2023-10-30 LAB — CBC WITH DIFFERENTIAL (CANCER CENTER ONLY)
Abs Immature Granulocytes: 0.07 10*3/uL (ref 0.00–0.07)
Basophils Absolute: 0.1 10*3/uL (ref 0.0–0.1)
Basophils Relative: 1 %
Eosinophils Absolute: 0.3 10*3/uL (ref 0.0–0.5)
Eosinophils Relative: 4 %
HCT: 32.7 % — ABNORMAL LOW (ref 39.0–52.0)
Hemoglobin: 10.5 g/dL — ABNORMAL LOW (ref 13.0–17.0)
Immature Granulocytes: 1 %
Lymphocytes Relative: 27 %
Lymphs Abs: 2 10*3/uL (ref 0.7–4.0)
MCH: 28 pg (ref 26.0–34.0)
MCHC: 32.1 g/dL (ref 30.0–36.0)
MCV: 87.2 fL (ref 80.0–100.0)
Monocytes Absolute: 0.7 10*3/uL (ref 0.1–1.0)
Monocytes Relative: 10 %
Neutro Abs: 4.1 10*3/uL (ref 1.7–7.7)
Neutrophils Relative %: 57 %
Platelet Count: 205 10*3/uL (ref 150–400)
RBC: 3.75 MIL/uL — ABNORMAL LOW (ref 4.22–5.81)
RDW: 14.7 % (ref 11.5–15.5)
WBC Count: 7.2 10*3/uL (ref 4.0–10.5)
nRBC: 0 % (ref 0.0–0.2)

## 2023-10-30 LAB — MISCELLANEOUS TEST

## 2023-10-30 MED ORDER — SODIUM CHLORIDE 0.9 % IV SOLN
300.0000 mg | Freq: Once | INTRAVENOUS | Status: DC
  Filled 2023-10-30: qty 15

## 2023-10-30 MED ORDER — SODIUM CHLORIDE 0.9 % IV SOLN
510.0000 mg | Freq: Once | INTRAVENOUS | Status: DC
  Filled 2023-10-30: qty 17

## 2023-10-30 NOTE — Progress Notes (Addendum)
 PATIENT NAVIGATOR PROGRESS NOTE  Name: Marco [name] Date: 10/30/2023 MRN: [id number]  DOB: 1933-10-13   Reason for visit:  mCRPC  Recommendations:  PSMA PET (Pending auth) Pluvicto  Zometa [facility]    Introduced myself to the patient, and his son, as the prostate nurse navigator.  No barriers to care identified at this time.  He is here to discuss his systemic treatment options.  I gave him my business card and asked him to call me with questions or concerns.  Verbalized understanding.   Plan of care in progress. Will continue to follow.

## 2023-10-30 NOTE — Telephone Encounter (Signed)
 Dr. Tina, patient will be scheduled as soon as possible.  Auth Submission: NO AUTH NEEDED Site of care: Site of care: [facility] Payer: [employer]HC dual complete medicare Medication & CPT/J Code(s) submitted: Feraheme (ferumoxytol) [id number] Diagnosis Code:  Route of submission (phone, fax, portal):  Phone # Fax # Auth type: Buy/Bill PB Units/visits requested: 510mg  x 2 doses Reference number:  Approval from: 10/30/23 to 02/29/24

## 2023-10-30 NOTE — Telephone Encounter (Signed)
 Okay to RF, 6-month supply.

## 2023-10-30 NOTE — Assessment & Plan Note (Addendum)
 Recommend Pluvicto Will stop Xtandi  once starts Pluvicto NGS testing Baseline PSA, T  Palliative care referral for ACP

## 2023-10-30 NOTE — Assessment & Plan Note (Addendum)
 Iv iron feraheme ordered.

## 2023-10-30 NOTE — Assessment & Plan Note (Signed)
 Fluid goal 60 + oz per day

## 2023-10-30 NOTE — Patient Instructions (Addendum)
 Will refer for Pluvicto. Obtain new baseline PET scan Obtain new PSA level as baseline Check additional mutations. Start zometa in the coming week. Continue Fosamax  until your first zometa. Continue vitamin D and calcium  daily Heart healthy diet Return about 1-2 week before 2nd dose of Pluvicto

## 2023-11-02 ENCOUNTER — Telehealth: Payer: Self-pay

## 2023-11-02 LAB — TESTOSTERONE: Testosterone: <3 ng/dL — ABNORMAL LOW (ref 264–916)

## 2023-11-02 LAB — PROSTATE-SPECIFIC AG, SERUM (LABCORP): Prostate Specific Ag, Serum: 607 ng/mL — ABNORMAL HIGH (ref 0.0–4.0)

## 2023-11-02 NOTE — Telephone Encounter (Signed)
 Scheduled appointments with the patient. He seemed really confused so I am mailing a reminder as well.

## 2023-11-03 NOTE — Telephone Encounter (Signed)
 Auth Submission: NO AUTH NEEDED - insurance change Site of care: Site of care: [facility] Payer: Medicare A/B Medication & CPT/J Code(s) submitted: Feraheme (ferumoxytol) [id number] Diagnosis Code:  Route of submission (phone, fax, portal):  Phone # Fax # Auth type: Buy/Bill PB Units/visits requested: 510mg  x 2 doses Reference number:  Approval from: 11/03/23 to 03/05/24

## 2023-11-04 ENCOUNTER — Other Ambulatory Visit: Payer: Self-pay | Admitting: Internal Medicine

## 2023-11-09 ENCOUNTER — Telehealth: Payer: Self-pay

## 2023-11-09 ENCOUNTER — Ambulatory Visit: Admitting: Internal Medicine

## 2023-11-09 NOTE — Telephone Encounter (Signed)
 Multiple no shows  03/03/22 06/23/23 11/09/2023  Warning letter mailed on 06/23/23 informing he is at 2 dismissals.   Would you like to begin dismissal process?

## 2023-11-10 NOTE — Telephone Encounter (Signed)
No dismissal

## 2023-11-12 NOTE — Progress Notes (Signed)
 Nuclear Medicine has attempted multiple times to reach out to schedule for PSMA PET and consult for Pluvicto with no success.   RN reached out to patient's son and was able to speak with him.   Reviewed recommendations of PSMA PET, Pluvicto, IV Iron , and Zometa.  Patient's son verbalized understanding.   Patient is now scheduled for PSMA PET, and consult on 7/24.   Zometa date pending, will have scheduled prior to Pluvicto.

## 2023-11-13 ENCOUNTER — Telehealth: Payer: Self-pay

## 2023-11-13 ENCOUNTER — Ambulatory Visit (INDEPENDENT_AMBULATORY_CARE_PROVIDER_SITE_OTHER)

## 2023-11-13 VITALS — BP 116/76 | HR 86 | Temp 98.0°F | Resp 18 | Ht 68.0 in | Wt 133.0 lb

## 2023-11-13 DIAGNOSIS — D508 Other iron deficiency anemias: Secondary | ICD-10-CM

## 2023-11-13 DIAGNOSIS — D509 Iron deficiency anemia, unspecified: Secondary | ICD-10-CM | POA: Diagnosis not present

## 2023-11-13 MED ORDER — SODIUM CHLORIDE 0.9 % IV SOLN
510.0000 mg | Freq: Once | INTRAVENOUS | Status: AC
  Administered 2023-11-13: 510 mg via INTRAVENOUS
  Filled 2023-11-13: qty 17

## 2023-11-13 NOTE — Progress Notes (Signed)
 Diagnosis: Iron  Deficiency Anemia  Provider:  Praveen Mannam MD  Procedure: IV Infusion  IV Type: Peripheral, IV Location: R Hand  Feraheme  (Ferumoxytol ), Dose: 510 mg  Infusion Start Time: 1143  Infusion Stop Time: 1159  Post Infusion IV Care: Observation period completed and Peripheral IV Discontinued  Discharge: Condition: Good, Destination: Home . AVS Provided  Performed by:  Latash Nouri, RN

## 2023-11-13 NOTE — Telephone Encounter (Signed)
 Plan of care signed and faxed back to [facility] at [phone]. Form sent for scanning.

## 2023-11-13 NOTE — Patient Instructions (Addendum)

## 2023-11-18 ENCOUNTER — Telehealth: Payer: Self-pay

## 2023-11-18 ENCOUNTER — Other Ambulatory Visit: Payer: Self-pay | Admitting: Internal Medicine

## 2023-11-18 ENCOUNTER — Telehealth: Payer: Self-pay | Admitting: *Deleted

## 2023-11-18 ENCOUNTER — Other Ambulatory Visit (INDEPENDENT_AMBULATORY_CARE_PROVIDER_SITE_OTHER): Payer: Self-pay

## 2023-11-18 ENCOUNTER — Ambulatory Visit (INDEPENDENT_AMBULATORY_CARE_PROVIDER_SITE_OTHER): Admitting: Orthopedic Surgery

## 2023-11-18 DIAGNOSIS — M545 Low back pain, unspecified: Secondary | ICD-10-CM

## 2023-11-18 DIAGNOSIS — M810 Age-related osteoporosis without current pathological fracture: Secondary | ICD-10-CM | POA: Diagnosis not present

## 2023-11-18 DIAGNOSIS — S72142D Displaced intertrochanteric fracture of left femur, subsequent encounter for closed fracture with routine healing: Secondary | ICD-10-CM

## 2023-11-18 NOTE — Telephone Encounter (Signed)
 Copied from CRM [id number]. Topic: General - Other >> Nov 18, 2023  1:55 PM Thersia BROCKS wrote: Reason for CRM: Patient called in stated he would like to speak with Dr.Paz or his nurses would like a callback regarding XTANDI  40 MG tablet  [phone]

## 2023-11-18 NOTE — Telephone Encounter (Signed)
 Son left a message requesting that a refill of Xtandi  be sent to [facility].

## 2023-11-18 NOTE — Progress Notes (Signed)
 Orthopedic Surgery Post-operative Office Visit   Procedure: left intertrochanteric femur fracture CMN Date of Surgery: 09/27/2023 (~6 weeks post-op)   Assessment: Patient is a 89 y.o. who is recovering as expected from his hip fracture but had a recent fall that resulted in a new L2 compression fracture     Plan: - No operative plans at this time -Will treat the lumbar compression fracture nonoperatively.  Can use the brace that he has for the next couple of weeks but then would wean out of it.  Can use Tylenol  as needed to help with the pain -Given his bilateral hip fractures and now a new compression fracture fall from low energy, I explained that this gives him a diagnosis of osteoporosis.  I referred him to our osteoporosis clinic for further management -Ambulate regularly and should continue to try to increase his distance ambulating over time -DVT ppx: none -Weightbearing as tolerated LLE -Return to office in 6 weeks, x-rays needed at next visit: AP/lateral left femur, AP/lateral lumbar   ___________________________________________________________________________     Subjective: Patient has noticed significant improvement in his hip pain since he was last seen in the office.  He is ambulating better and is walking longer distances with his walker.  He did recently have a fall although within the last week and noted onset of low back pain.  He has had back pain before but it is usually more mild and goes away.  He has no pain radiating into either lower extremity.  Has not noticed any weakness of his legs.  No bowel or bladder incontinence.  No saddle anesthesia.   Objective:   General: no acute distress, appropriate affect Neurologic: alert, answering questions appropriately, following commands Respiratory: unlabored breathing on room air Skin: incision are well-approximated with no erythema, induration, active/expressible drainage   MSK (LLE): foot warm and well-perfused, palpable  DP pulse, ambulates well with walker, able to perform sit to stand independently  MSK (spine):  -Strength exam      Left  Right EHL    5/5  5/5 TA    5/5  5/5 GSC    5/5  5/5 Knee extension  5/5  5/5 Hip flexion   5/5  5/5  -Sensory exam    Sensation intact to light touch in L3-S1 nerve distributions of bilateral lower extremities   Imaging: X-rays of the left femur from 11/18/2023 were independently reviewed and interpreted, showing displaced intertrochanteric femur fracture which appears unchanged in terms of displacement and alignment from prior films on 10/14/2023.  There is a long cephalomedullary rod in place spanning the fracture.  The interlocking screws are without any lucency around them.  There is no lucency seen around the lag screws.  No new fracture seen.  XRs of the lumbar spine from 11/18/2023 were independent reviewed and interpreted, showing disc height loss at L4/5 and L5/S1.  No spondylolisthesis seen.  Compression fracture seen at L2.  No other fracture seen.  No dislocation seen.     Patient name: Marco [name] Patient MRN: [id number] Date of visit: 11/18/23

## 2023-11-18 NOTE — Telephone Encounter (Signed)
 Error CRM created, chart not reviewed, refills were denied because Pt no showed his appt on 11/09/23.

## 2023-11-18 NOTE — Telephone Encounter (Signed)
 Needs appt

## 2023-11-18 NOTE — Telephone Encounter (Signed)
 Copied from CRM [id number]. Topic: Clinical - Medication Refill >> Nov 18, 2023 12:31 PM Gennette ORN wrote: Medication: XTANDI  40 MG tablet, metFORMIN  (GLUCOPHAGE ) 500 MG tablet,  atorvastatin  (LIPITOR) 40 MG tablet,  alendronate  (FOSAMAX ) 70 MG tablet ,  levothyroxine  (SYNTHROID ) 100 MCG tablet, sodium zirconium cyclosilicate  (LOKELMA ) 10 g PACK packet ,  glucose blood (ACCU-CHEK GUIDE) test strip  , Alcohol  Swabs  PADS   Has the patient contacted their pharmacy? Yes (Agent: If no, request that the patient contact the pharmacy for the refill. If patient does not wish to contact the pharmacy document the reason why and proceed with request.) (Agent: If yes, when and what did the pharmacy advise?)  This is the patient's preferred pharmacy:  [id number] - [place], [place] - [address] [place] [address] Phone: [phone] Fax: [phone]  [facility] - [place], [place] - [address] [place] [address] Phone: [phone] Fax: [phone]  Is this the correct pharmacy for this prescription? Yes If no, delete pharmacy and type the correct one.   Has the prescription been filled recently? Yes  Is the patient out of the medication? Yes  Has the patient been seen for an appointment in the last year OR does the patient have an upcoming appointment? Yes  Can we respond through MyChart? Yes  Agent: Please be advised that Rx refills may take up to 3 business days. We ask that you follow-up with your pharmacy.

## 2023-11-20 ENCOUNTER — Other Ambulatory Visit: Payer: Self-pay

## 2023-11-20 ENCOUNTER — Ambulatory Visit

## 2023-11-20 ENCOUNTER — Telehealth: Payer: Self-pay

## 2023-11-20 VITALS — BP 123/77 | HR 104 | Temp 97.6°F | Resp 18 | Ht 68.0 in | Wt 133.0 lb

## 2023-11-20 DIAGNOSIS — D509 Iron deficiency anemia, unspecified: Secondary | ICD-10-CM

## 2023-11-20 DIAGNOSIS — D508 Other iron deficiency anemias: Secondary | ICD-10-CM

## 2023-11-20 MED ORDER — SODIUM CHLORIDE 0.9 % IV SOLN
510.0000 mg | Freq: Once | INTRAVENOUS | Status: AC
  Administered 2023-11-20: 510 mg via INTRAVENOUS
  Filled 2023-11-20: qty 17

## 2023-11-20 NOTE — Progress Notes (Signed)
 RN spoke with patient's son to review recommendations from Dr. Tina regarding Xtandi .   Patient will stop Xtandi  and proceed with Pluvicto.  Verbalized understanding.   RN provided direction for him to reach out to PCP for management of other medications for diabetes and cholesterol.

## 2023-11-20 NOTE — Telephone Encounter (Signed)
 Scheduled/rescheduled appointments with the patients son.

## 2023-11-20 NOTE — Progress Notes (Signed)
 Diagnosis: Iron  Deficiency Anemia  Provider:  Praveen Mannam MD  Procedure: IV Infusion  IV Type: Peripheral, IV Location: R Hand  Feraheme  (Ferumoxytol ), Dose: 510 mg  Infusion Start Time: 1015  Infusion Stop Time: 1034  Post Infusion IV Care: Observation period completed and Peripheral IV Discontinued  Discharge: Condition: Good, Destination: Home . AVS Declined  Performed by:  Maximiano JONELLE Pouch, LPN

## 2023-11-23 ENCOUNTER — Inpatient Hospital Stay

## 2023-11-23 ENCOUNTER — Inpatient Hospital Stay: Attending: Internal Medicine

## 2023-11-23 VITALS — BP 136/72 | HR 96 | Temp 97.7°F | Resp 16

## 2023-11-23 DIAGNOSIS — D649 Anemia, unspecified: Secondary | ICD-10-CM

## 2023-11-23 DIAGNOSIS — Z79899 Other long term (current) drug therapy: Secondary | ICD-10-CM | POA: Diagnosis not present

## 2023-11-23 DIAGNOSIS — C61 Malignant neoplasm of prostate: Secondary | ICD-10-CM

## 2023-11-23 DIAGNOSIS — C7951 Secondary malignant neoplasm of bone: Secondary | ICD-10-CM | POA: Insufficient documentation

## 2023-11-23 LAB — CMP (CANCER CENTER ONLY)
ALT: 15 U/L (ref 0–44)
AST: 30 U/L (ref 15–41)
Albumin: 3.7 g/dL (ref 3.5–5.0)
Alkaline Phosphatase: 111 U/L (ref 38–126)
Anion gap: 6 (ref 5–15)
BUN: 30 mg/dL — ABNORMAL HIGH (ref 8–23)
CO2: 26 mmol/L (ref 22–32)
Calcium: 9.7 mg/dL (ref 8.9–10.3)
Chloride: 103 mmol/L (ref 98–111)
Creatinine: 1.31 mg/dL — ABNORMAL HIGH (ref 0.61–1.24)
GFR, Estimated: 52 mL/min — ABNORMAL LOW (ref >60)
Glucose, Bld: 159 mg/dL — ABNORMAL HIGH (ref 70–99)
Potassium: 4.6 mmol/L (ref 3.5–5.1)
Sodium: 135 mmol/L (ref 135–145)
Total Bilirubin: 0.7 mg/dL (ref 0.0–1.2)
Total Protein: 6.8 g/dL (ref 6.5–8.1)

## 2023-11-23 LAB — CBC WITH DIFFERENTIAL (CANCER CENTER ONLY)
Abs Immature Granulocytes: 0.1 K/uL — ABNORMAL HIGH (ref 0.00–0.07)
Basophils Absolute: 0 K/uL (ref 0.0–0.1)
Basophils Relative: 1 %
Eosinophils Absolute: 0.4 K/uL (ref 0.0–0.5)
Eosinophils Relative: 5 %
HCT: 31.3 % — ABNORMAL LOW (ref 39.0–52.0)
Hemoglobin: 10.2 g/dL — ABNORMAL LOW (ref 13.0–17.0)
Immature Granulocytes: 2 %
Lymphocytes Relative: 29 %
Lymphs Abs: 2 K/uL (ref 0.7–4.0)
MCH: 28 pg (ref 26.0–34.0)
MCHC: 32.6 g/dL (ref 30.0–36.0)
MCV: 86 fL (ref 80.0–100.0)
Monocytes Absolute: 0.6 K/uL (ref 0.1–1.0)
Monocytes Relative: 8 %
Neutro Abs: 3.9 K/uL (ref 1.7–7.7)
Neutrophils Relative %: 55 %
Platelet Count: 187 K/uL (ref 150–400)
RBC: 3.64 MIL/uL — ABNORMAL LOW (ref 4.22–5.81)
RDW: 15.2 % (ref 11.5–15.5)
WBC Count: 6.9 K/uL (ref 4.0–10.5)
nRBC: 0 % (ref 0.0–0.2)

## 2023-11-23 LAB — SAMPLE TO BLOOD BANK

## 2023-11-23 LAB — LACTATE DEHYDROGENASE: LDH: 252 U/L — ABNORMAL HIGH (ref 98–192)

## 2023-11-23 MED ORDER — ZOLEDRONIC ACID 4 MG/5ML IV CONC
3.0000 mg | Freq: Once | INTRAVENOUS | Status: AC
  Administered 2023-11-23: 3 mg via INTRAVENOUS
  Filled 2023-11-23: qty 3.75

## 2023-11-23 MED ORDER — ZOLEDRONIC ACID 4 MG/100ML IV SOLN: 4.0000 mg | Freq: Once | INTRAVENOUS | Status: DC

## 2023-11-23 MED ORDER — SODIUM CHLORIDE 0.9 % IV SOLN
INTRAVENOUS | Status: DC
Start: 2023-11-23 — End: 2023-11-23

## 2023-11-23 NOTE — Patient Instructions (Signed)

## 2023-11-23 NOTE — Progress Notes (Signed)
 Pt. and son deny any recent dental issues, procedures, and no dental pain noted.

## 2023-11-23 NOTE — Progress Notes (Signed)
 CrCl = 33.2 ml/min  Adjust dose of Zometa  to 3 mg IVPB   T.O. Dr Jacobo Molt, PharmD

## 2023-11-24 ENCOUNTER — Encounter (HOSPITAL_COMMUNITY)

## 2023-11-24 LAB — PROSTATE-SPECIFIC AG, SERUM (LABCORP): Prostate Specific Ag, Serum: 1104 ng/mL — ABNORMAL HIGH (ref 0.0–4.0)

## 2023-11-24 LAB — TESTOSTERONE: Testosterone: 7 ng/dL — ABNORMAL LOW (ref 264–916)

## 2023-11-26 ENCOUNTER — Encounter (HOSPITAL_COMMUNITY)
Admission: RE | Admit: 2023-11-26 | Discharge: 2023-11-26 | Disposition: A | Discharge status: Home / Self Care | Source: Ambulatory Visit

## 2023-11-26 ENCOUNTER — Other Ambulatory Visit: Payer: Self-pay

## 2023-11-26 ENCOUNTER — Ambulatory Visit: Payer: Self-pay

## 2023-11-26 DIAGNOSIS — C61 Malignant neoplasm of prostate: Secondary | ICD-10-CM

## 2023-11-26 DIAGNOSIS — C7951 Secondary malignant neoplasm of bone: Secondary | ICD-10-CM | POA: Diagnosis present

## 2023-11-26 NOTE — Consult Note (Signed)
 Chief Complaint: Patient with metastatic castrate resistant prostate cancer. Evaluation for   Lu 177 PSMA therapy (Pluvicto).  Referring Physician(s):Cheng    Patient Status: [facility] - Out-pt  History of Present Illness: Marco [name] is a 89 y.o. male with castrate resistant metastatic prostate carcinoma.  Initial diagnosis in 2022 with Gleason 4+5=9 prostate carcinoma with metastatic lymph nodes and metastatic bone disease.  Patient has undergone androgen deprivation.  Evidence of progression on androgen deprivation.     Recent PSMA PET scan 11/26/2023 demonstrates marked progression radiotracer avid skeletal metastasis.  Additional progression liver metastasis.     PSA rapidly increasing and now greater than 1,000.     Past Medical History:  Diagnosis Date   Amaurosis fugax 2017   Diabetes mellitus    DJD (degenerative joint disease) 08/25/2013   Elevated PSA    Other and unspecified hyperlipidemia 04/25/2013   Prostate cancer [facility])     Past Surgical History:  Procedure Laterality Date   CATARACT EXTRACTION     Bilaterally   FRACTURE SURGERY Right    rod 2020   INTRAMEDULLARY (IM) NAIL INTERTROCHANTERIC Left 09/27/2023   Procedure: FIXATION, FRACTURE, INTERTROCHANTERIC, WITH INTRAMEDULLARY ROD;  Surgeon: Georgina Ozell LABOR, MD;  Location: [facility] OR;  Service: Orthopedics;  Laterality: Left;   TYMPANIC MEMBRANE REPAIR     R    Allergies: Beef-derived drug products, Chicken allergy, Fish allergy, and Pork-derived products  Medications: Prior to Admission medications   Medication Sig Start Date End Date Taking? Authorizing Provider  Accu-Chek Softclix Lancets lancets CHECK BLOOD SUGAR ONCE DAILY AS  DIRECTED 10/24/22   Amon Aloysius BRAVO, MD  Alcohol  Swabs  PADS Use to clean finger prior to testing blood sugars 03/13/21   Amon Aloysius BRAVO, MD  alendronate  (FOSAMAX ) 70 MG tablet Take 1 tablet (70 mg total) by mouth once a week. Take with full glass of water on empty stomach 10/15/23    Amon Aloysius BRAVO, MD  atorvastatin  (LIPITOR) 40 MG tablet Take 1 tablet (40 mg total) by mouth at bedtime. 11/05/23   Paz, Jose E, MD  Blood Glucose Calibration (ACCU-CHEK GUIDE CONTROL) LIQD Use as directed 12/24/20   Paz, Jose E, MD  Blood Glucose Monitoring Suppl (ACCU-CHEK GUIDE) w/Device KIT Check blood sugars once daily 09/09/22   Paz, Jose E, MD  Calcium  Carb-Cholecalciferol (CALCIUM  500 + D) 500-125 MG-UNIT TABS Take 1 tablet by mouth daily.    [provider]  Cyanocobalamin  (VITAMIN B-12 PO) Take 2,500 mcg by mouth daily.    [provider]  glucose blood (ACCU-CHEK GUIDE) test strip USE TO CHECK BLOOD SUGAR ONCE  DAILY 10/24/22   Paz, Jose E, MD  levothyroxine  (SYNTHROID ) 100 MCG tablet TAKE 1 TABLET BY MOUTH DAILY  BEFORE BREAKFAST 07/01/23   Paz, Jose E, MD  metFORMIN  (GLUCOPHAGE ) 500 MG tablet Take 1 tablet (500 mg total) by mouth daily with breakfast. 11/05/23   Paz, Jose E, MD  Multiple Vitamin (MULTI-VITAMIN DAILY PO) Take 1 tablet by mouth daily.    [provider]  sodium zirconium cyclosilicate  (LOKELMA ) 10 g PACK packet MIX 1 PACKET IN AT LEAST 3  TABLESPOONS OF WATER STIR AND  DRINK IMMEDIATELY EVERY OTHER  DAY 10/30/23   Paz, Jose E, MD  XTANDI  40 MG tablet Take 120 mg by mouth daily. 07/12/21   [provider]     Family History  Problem Relation Age of Onset   Other Mother        Unknown  Other Father        Unknown   Colon cancer Nephew 35   Diabetes Neg Hx    CAD Neg Hx     Social History   Socioeconomic History   Marital status: Legally Separated    Spouse name: Not on file   Number of children: 3   Years of education: Not on file   Highest education level: Not on file  Occupational History   Occupation: Retired  Tobacco Use   Smoking status: Never   Smokeless tobacco: Never  Vaping Use   Vaping status: Never Used  Substance and Sexual Activity   Alcohol  use: No   Drug use: No   Sexual activity: Not on file  Other Topics  Concern   Not on file  Social History Narrative   Moved to the [place] area 2012     Original from [place], separated   Lives w/ his son's family    2 daughters, 1 son     Social Drivers of Corporate investment banker Strain: Low Risk  (10/30/2021)   Overall Financial Resource Strain (CARDIA)    Difficulty of Paying Living Expenses: Not very hard  Food Insecurity: No Food Insecurity (10/30/2023)   Hunger Vital Sign    Worried About Running Out of Food in the Last Year: Never true    Ran Out of Food in the Last Year: Never true  Transportation Needs: No Transportation Needs (10/30/2023)   PRAPARE - Administrator, Civil Service (Medical): No    Lack of Transportation (Non-Medical): No  Physical Activity: Inactive (10/25/2020)   Exercise Vital Sign    Days of Exercise per Week: 0 days    Minutes of Exercise per Session: 0 min  Stress: No Stress Concern Present (09/04/2021)   [employer] of Occupational Health - Occupational Stress Questionnaire    Feeling of Stress : Not at all  Social Connections: Moderately Isolated (09/27/2023)   Social Connection and Isolation Panel    Frequency of Communication with Friends and Family: More than three times a week    Frequency of Social Gatherings with Friends and Family: More than three times a week    Attends Religious Services: 1 to 4 times per year    Active Member of [employer] or Organizations: No    Attends Banker Meetings: Never    Marital Status: Widowed    ECOG Status: 2 - Symptomatic, <50% confined to bed  Review of Systems: A 12 point ROS discussed and pertinent positives are indicated in the HPI above.  All other systems are negative.  Review of Systems   Back pain.  No incontinence.  Right hip pain improving following internal fixation of left femur fracture.   Vital Signs: There were no vitals taken for this visit.  Physical Exam Frail alert elderly male.    Imaging:    IMPRESSION: 1.  Significant progression of skeletal metastasis. 2. Significant progression of hepatic metastasis. 3. No evidence of local recurrence in the prostatectomy bed. 4. Aortic Atherosclerosis (ICD10-I70.0).  Labs: Recent Labs    06/26/23 0000 10/30/23 1235 11/23/23 1225  PSA 35.6  --   --   PSA1  --  607.0* 1,104.0*    CBC: Recent Labs    09/29/23 0540 09/30/23 0459 10/30/23 1235 11/23/23 1225  WBC 6.7 7.1 7.2 6.9  HGB 9.7* 9.7* 10.5* 10.2*  HCT 29.5* 30.3* 32.7* 31.3*  PLT 122* 142* 205 187    COAGS: Recent  Labs    09/27/23 0000  INR 1.0    BMP: Recent Labs    09/29/23 0540 09/30/23 0459 10/30/23 1235 11/23/23 1225  NA 136 133* 136 135  K 4.3 4.2 5.3* 4.6  CL 101 101 103 103  CO2 26 26 25 26   GLUCOSE 152* 154* 111* 159*  BUN 20 23 21  30*  CALCIUM  8.8* 8.3* 9.5 9.7  CREATININE 1.51* 1.36* 1.43* 1.31*  GFRNONAA 44* 50* 47* 52*    LIVER FUNCTION TESTS: Recent Labs    09/27/23 0000 09/27/23 0518 10/30/23 1235 11/23/23 1225  BILITOT 0.7 0.6 0.6 0.7  AST 24 21 26 30   ALT 12 13 11 15   ALKPHOS 63 56 144* 111  PROT 6.4* 6.2* 7.1 6.8  ALBUMIN  3.3* 3.1* 3.8 3.7    TUMOR MARKERS: No results for input(s): AFPTM, CEA, CA199, CHROMOGA in the last 8760 hours.  Assessment and Plan:  [Patient is adequate candidate Lu 177 PSMA therapy ( vipivotide tetraxetan).  Patient demonstrates marked progression metastatic skeletal disease and liver mets identified on recent PSMA PET scan.  Additionally patient's PSA is increasing.  Patient has demonstrated progression on androgen deprivation.   Patient explained major and minor risks and benefits of therapy.  Son in attendance and asked most questions.   Major benefit being progression-free survival.  Major risk being myelosuppression and renal toxicity. Minor toxicity of xerostomia.  All the patient's questions were answered.  Patient accompanied by daughter who was also present for consult.    Patient is scheduled  for 6 treatments spaced 6 weeks apart.  Recommend following up with oncologist for CBC and CMP 1 week prior to each treatment to assess safety of continuing with therapy.      Thank you for this interesting consult.  I greatly enjoyed meeting Marco [name] and look forward to participating in their care.  A copy of this report was sent to the requesting provider on this date.  Electronically Signed: Norleen GORMAN Boxer, MD 11/26/2023, 4:10 PM   I spent a total of  30 Minutes   in face to face in clinical consultation, greater than 50% of which was counseling/coordinating care for metastatic neuroendocrine tumor.

## 2023-11-27 ENCOUNTER — Telehealth: Payer: Self-pay

## 2023-11-27 MED ORDER — METFORMIN HCL 500 MG PO TABS: 500.0000 mg | ORAL_TABLET | Freq: Every day | ORAL | 1 refills | Status: DC

## 2023-11-27 MED ORDER — ATORVASTATIN CALCIUM 40 MG PO TABS: 40.0000 mg | ORAL_TABLET | Freq: Every day | ORAL | 1 refills | Status: DC

## 2023-11-27 MED ORDER — LEVOTHYROXINE SODIUM 100 MCG PO TABS: 100.0000 ug | ORAL_TABLET | Freq: Every day | ORAL | 1 refills | Status: DC

## 2023-11-27 NOTE — Telephone Encounter (Signed)
 Copied from CRM [id number]. Topic: General - Other >> Nov 27, 2023 10:52 AM Rosina BIRCH wrote: Reason for CRM: patient son would like to be called regarding a letter to not travel outside to another country and medicine for the patient. Patient son want to pick it up today CB [phone]

## 2023-11-27 NOTE — Telephone Encounter (Signed)
 Patient needs a letter to the airline because patient is to sick to travel.  The flight is 24hrs and he is unable to sit for that long on the plane.  He has had a fall and he seeing oncology.  Advised that Dr. Amon is out of the office until 8/4.  He stated that he needed the letter as soon as possible. Also advised that I will speak with Dr. Amon assistant on Monday in regards to letter.  They also needed rxs sent into [facility] ([facility] for atorvastatin , metformin  and levothyroxine .

## 2023-11-27 NOTE — Addendum Note (Signed)
 Addended by: ESTELLE GILLIS D on: 11/27/2023 03:41 PM   Modules accepted: Orders

## 2023-11-27 NOTE — Telephone Encounter (Signed)
 Called and spoke to patient's son of PET results and he is aware. He is scheduled for pluvicto on 7/30.  Recommend cancel appt on 7/31 and reschedule to before cycle 2.  Please change appt to end Aug.  Report he needs a letter due to father now having prostate cancer progression and needs treatment. He cannot get out of the country and need refund on his flight and traveling cost. Letter written. Please mail to patient at their address.

## 2023-11-27 NOTE — Progress Notes (Signed)
 Patient will proceed with Pluvicto on 7/30.

## 2023-11-29 ENCOUNTER — Other Ambulatory Visit: Payer: Self-pay | Admitting: Internal Medicine

## 2023-11-30 ENCOUNTER — Telehealth: Payer: Self-pay

## 2023-11-30 NOTE — Telephone Encounter (Signed)
 Left the patient a voicemail with the rescheduled appointment details per staff message.

## 2023-11-30 NOTE — Telephone Encounter (Signed)
 Will have to wait until PCP returns.

## 2023-12-01 ENCOUNTER — Inpatient Hospital Stay

## 2023-12-01 NOTE — Written Directive (Cosign Needed)
  PLUVICTO   THERAPY   RADIOPHARMACEUTICAL: Lutetium 177 vipivotide tetraxetan (Pluvicto )     PRESCRIBED DOSE FOR ADMINISTRATION:  200 mCi   ROUTE OFADMINISTRATION:  IV   DIAGNOSIS:  prostate cancer mets to bone, Prostate cancer, residual or recurrent disease suspected, Prostate cancer metastatic to bone [facility])    REFERRING PHYSICIAN: Tina Pauletta BROCKS, MD   TREATMENT #: 1   ADDITIONAL PHYSICIAN COMMENTS/NOTES:   AUTHORIZED USER SIGNATURE & TIME STAMP: .johnny

## 2023-12-02 ENCOUNTER — Other Ambulatory Visit: Payer: Self-pay | Admitting: Radiology

## 2023-12-02 ENCOUNTER — Encounter (HOSPITAL_COMMUNITY)
Admission: RE | Admit: 2023-12-02 | Discharge: 2023-12-02 | Disposition: A | Discharge status: Home / Self Care | Source: Ambulatory Visit

## 2023-12-02 ENCOUNTER — Other Ambulatory Visit: Payer: Self-pay

## 2023-12-02 ENCOUNTER — Other Ambulatory Visit (HOSPITAL_COMMUNITY): Payer: Self-pay

## 2023-12-02 DIAGNOSIS — C7951 Secondary malignant neoplasm of bone: Secondary | ICD-10-CM | POA: Diagnosis present

## 2023-12-02 DIAGNOSIS — C61 Malignant neoplasm of prostate: Secondary | ICD-10-CM | POA: Insufficient documentation

## 2023-12-02 LAB — CBC WITH DIFFERENTIAL/PLATELET
Abs Immature Granulocytes: 0.14 K/uL — ABNORMAL HIGH (ref 0.00–0.07)
Basophils Absolute: 0.1 K/uL (ref 0.0–0.1)
Basophils Relative: 1 %
Eosinophils Absolute: 0.2 K/uL (ref 0.0–0.5)
Eosinophils Relative: 3 %
HCT: 36.5 % — ABNORMAL LOW (ref 39.0–52.0)
Hemoglobin: 11.1 g/dL — ABNORMAL LOW (ref 13.0–17.0)
Immature Granulocytes: 2 %
Lymphocytes Relative: 23 %
Lymphs Abs: 1.6 K/uL (ref 0.7–4.0)
MCH: 27.4 pg (ref 26.0–34.0)
MCHC: 30.4 g/dL (ref 30.0–36.0)
MCV: 90.1 fL (ref 80.0–100.0)
Monocytes Absolute: 0.6 K/uL (ref 0.1–1.0)
Monocytes Relative: 9 %
Neutro Abs: 4.3 K/uL (ref 1.7–7.7)
Neutrophils Relative %: 62 %
Platelets: 169 K/uL (ref 150–400)
RBC: 4.05 MIL/uL — ABNORMAL LOW (ref 4.22–5.81)
RDW: 15.7 % — ABNORMAL HIGH (ref 11.5–15.5)
WBC: 6.9 K/uL (ref 4.0–10.5)
nRBC: 0 % (ref 0.0–0.2)

## 2023-12-02 LAB — BASIC METABOLIC PANEL WITH GFR
Anion gap: 11 (ref 5–15)
BUN: 25 mg/dL — ABNORMAL HIGH (ref 8–23)
CO2: 21 mmol/L — ABNORMAL LOW (ref 22–32)
Calcium: 9.3 mg/dL (ref 8.9–10.3)
Chloride: 102 mmol/L (ref 98–111)
Creatinine, Ser: 1.52 mg/dL — ABNORMAL HIGH (ref 0.61–1.24)
GFR, Estimated: 44 mL/min — ABNORMAL LOW (ref >60)
Glucose, Bld: 155 mg/dL — ABNORMAL HIGH (ref 70–99)
Potassium: 4.8 mmol/L (ref 3.5–5.1)
Sodium: 134 mmol/L — ABNORMAL LOW (ref 135–145)

## 2023-12-02 LAB — PSA: Prostatic Specific Antigen: >1500 ng/mL — ABNORMAL HIGH (ref 0.00–4.00)

## 2023-12-02 MED ORDER — LUTETIUM LU 177 VIPIVOTIDE TET 1000 MBQ/ML IV SOLN
211.9000 | Freq: Once | INTRAVENOUS | Status: AC
  Administered 2023-12-02: 211.9 via INTRAVENOUS

## 2023-12-02 MED ORDER — ONDANSETRON HCL 4 MG PO TABS
4.0000 mg | ORAL_TABLET | Freq: Three times a day (TID) | ORAL | 0 refills | Status: DC | PRN
  Filled 2023-12-02: qty 20, 7d supply, fill #0

## 2023-12-02 MED ORDER — SODIUM CHLORIDE 0.9 % IV BOLUS
1000.0000 mL | Freq: Once | INTRAVENOUS | Status: AC
  Administered 2023-12-02: 1000 mL via INTRAVENOUS

## 2023-12-02 MED ORDER — ONDANSETRON HCL 4 MG PO TABS: 4.0000 mg | ORAL_TABLET | Freq: Three times a day (TID) | ORAL | 0 refills | Status: DC | PRN

## 2023-12-02 NOTE — Progress Notes (Signed)
 CLINICAL DATA: [89 year old male with prostate cancer.  Castrate resistant metastatic prostate cancer.  PSMA avid metastatic disease identified within the bones and liver on recent PSMA PET scan.]  EXAM: NUCLEAR MEDICINE PLUVICTO  INJECTION  TECHNIQUE: Infusion: The nuclear medicine technologist and I personally verified the dose activity to be delivered as specified in the written directive, and verified the patient identification via 2 separate methods.  Initial flush of the intravenous catheter was performed was sterile saline. The dose syringe was connected to the catheter and the Lu-177 Pluvicto  administered over a 1 to 10 min infusion. Single 10 cc  lushes with normal saline follow the dose. No complications were noted. The entire IV tubing, venocatheter, stopcock and syringes was removed in total, placed in a disposal bag and sent for assay of the residual activity, which will be reported at a later time in our EMR by the physics staff. Pressure was applied to the venipuncture site, and a compression bandage placed. Patient monitored for 1 hour following infusion.    Radiation Safety personnel were present to perform the discharge survey, as detailed on their documentation. After a short period of observation, the patient had his IV removed.  RADIOPHARMACEUTICALS: [211.9] microcuries Lu-177 PLUVICTO   FINDINGS: Current Infusion: [1]  Planned Infusions: 6    Patient presented to nuclear medicine for treatment.  Patient accompanied by son.  Risk and benefits again explained and consent obtained.      The patient's most recent blood counts were reviewed and remains a adequate candidate to proceed with Lu-177 Pluvicto .       Hemoglobin equal 11.1.  Normal white blood cell count.  Normal platelet count.      Creatinine equal 1.52     PSA equal greater than 1,500.  PSA equal 607 one  month prior.       The patient was situated in an infusion suite with a contact barrier  placed under the arm. Intravenous access was established, using sterile technique, and a normal saline infusion from a syringe was started.     Micro-dosimetry: The prescribed radiation activity was assayed and confirmed to be within specified tolerance.  IMPRESSION: Current Infusion: [1]  Planned Infusions: 6    [The patient tolerated the infusion well. The patient will return in one month for ongoing care.]

## 2023-12-03 ENCOUNTER — Inpatient Hospital Stay

## 2023-12-03 ENCOUNTER — Other Ambulatory Visit: Payer: Self-pay | Admitting: Internal Medicine

## 2023-12-03 MED ORDER — ACCU-CHEK GUIDE W/DEVICE KIT: PACK | 0 refills | Status: DC

## 2023-12-03 NOTE — Telephone Encounter (Signed)
 Copied from CRM [id number]. Topic: Clinical - Medication Refill >> Dec 03, 2023  1:25 PM Burnard H wrote: Medication:  Blood Glucose Monitoring Suppl (ACCU-CHEK GUIDE) w/Device KIT    Has the patient contacted their pharmacy? No (Agent: If no, request that the patient contact the pharmacy for the refill. If patient does not wish to contact the pharmacy document the reason why and proceed with request.) (Agent: If yes, when and what did the pharmacy advise?)  This is the patient's preferred pharmacy:  [id number] - [place], [place] - [address] [place] [address] Phone: [phone] Fax: [phone]   Is this the correct pharmacy for this prescription? Yes If no, delete pharmacy and type the correct one.   Has the prescription been filled recently? No  Is the patient out of the medication? Yes  Has the patient been seen for an appointment in the last year OR does the patient have an upcoming appointment? Yes  Can we respond through MyChart? No  Agent: Please be advised that Rx refills may take up to 3 business days. We ask that you follow-up with your pharmacy. **Patient stated that his blood glucose monitor is broke.He would ike provider to send prescription in for him to get a new one**

## 2023-12-07 NOTE — Telephone Encounter (Signed)
 Spoke w/ Ollis- informed letter ready for pick up at front desk. Follow-up appt scheduled.

## 2023-12-07 NOTE — Telephone Encounter (Signed)
 Okay to send a letter: In my medical opinion Marco [name] is unable to fly due to multiple medical problems including advanced prostate cancer with significant musculoskeletal pain. Please do not hesitate to contact my office if further information is needed.

## 2023-12-08 ENCOUNTER — Other Ambulatory Visit: Payer: Self-pay

## 2023-12-08 ENCOUNTER — Telehealth: Payer: Self-pay | Admitting: *Deleted

## 2023-12-08 ENCOUNTER — Other Ambulatory Visit: Payer: Self-pay | Admitting: Internal Medicine

## 2023-12-08 ENCOUNTER — Inpatient Hospital Stay (HOSPITAL_COMMUNITY)

## 2023-12-08 ENCOUNTER — Emergency Department (HOSPITAL_COMMUNITY)

## 2023-12-08 ENCOUNTER — Inpatient Hospital Stay (HOSPITAL_COMMUNITY)
Admission: EM | Admit: 2023-12-08 | Discharge: 2023-12-17 | DRG: 064 | Disposition: A | Discharge status: Skilled Nursing Facility | Attending: Family Medicine | Admitting: Family Medicine

## 2023-12-08 ENCOUNTER — Encounter (HOSPITAL_COMMUNITY): Payer: Self-pay

## 2023-12-08 DIAGNOSIS — G9341 Metabolic encephalopathy: Secondary | ICD-10-CM | POA: Diagnosis present

## 2023-12-08 DIAGNOSIS — I6389 Other cerebral infarction: Secondary | ICD-10-CM | POA: Diagnosis not present

## 2023-12-08 DIAGNOSIS — D6869 Other thrombophilia: Secondary | ICD-10-CM | POA: Diagnosis present

## 2023-12-08 DIAGNOSIS — Z7901 Long term (current) use of anticoagulants: Secondary | ICD-10-CM

## 2023-12-08 DIAGNOSIS — Z66 Do not resuscitate: Secondary | ICD-10-CM | POA: Diagnosis present

## 2023-12-08 DIAGNOSIS — D6182 Myelophthisis: Secondary | ICD-10-CM | POA: Diagnosis present

## 2023-12-08 DIAGNOSIS — N179 Acute kidney failure, unspecified: Secondary | ICD-10-CM | POA: Insufficient documentation

## 2023-12-08 DIAGNOSIS — N170 Acute kidney failure with tubular necrosis: Secondary | ICD-10-CM | POA: Diagnosis present

## 2023-12-08 DIAGNOSIS — W19XXXA Unspecified fall, initial encounter: Secondary | ICD-10-CM

## 2023-12-08 DIAGNOSIS — Z91018 Allergy to other foods: Secondary | ICD-10-CM

## 2023-12-08 DIAGNOSIS — H547 Unspecified visual loss: Secondary | ICD-10-CM | POA: Diagnosis present

## 2023-12-08 DIAGNOSIS — I63532 Cerebral infarction due to unspecified occlusion or stenosis of left posterior cerebral artery: Secondary | ICD-10-CM

## 2023-12-08 DIAGNOSIS — N1832 Chronic kidney disease, stage 3b: Secondary | ICD-10-CM | POA: Diagnosis present

## 2023-12-08 DIAGNOSIS — E8721 Acute metabolic acidosis: Secondary | ICD-10-CM | POA: Diagnosis present

## 2023-12-08 DIAGNOSIS — I639 Cerebral infarction, unspecified: Principal | ICD-10-CM | POA: Diagnosis present

## 2023-12-08 DIAGNOSIS — S32020A Wedge compression fracture of second lumbar vertebra, initial encounter for closed fracture: Secondary | ICD-10-CM

## 2023-12-08 DIAGNOSIS — R29704 NIHSS score 4: Secondary | ICD-10-CM | POA: Diagnosis not present

## 2023-12-08 DIAGNOSIS — Z7189 Other specified counseling: Secondary | ICD-10-CM | POA: Diagnosis not present

## 2023-12-08 DIAGNOSIS — D631 Anemia in chronic kidney disease: Secondary | ICD-10-CM | POA: Diagnosis present

## 2023-12-08 DIAGNOSIS — Z79899 Other long term (current) drug therapy: Secondary | ICD-10-CM

## 2023-12-08 DIAGNOSIS — M8448XA Pathological fracture, other site, initial encounter for fracture: Secondary | ICD-10-CM | POA: Diagnosis present

## 2023-12-08 DIAGNOSIS — C61 Malignant neoplasm of prostate: Secondary | ICD-10-CM | POA: Diagnosis present

## 2023-12-08 DIAGNOSIS — E785 Hyperlipidemia, unspecified: Secondary | ICD-10-CM | POA: Diagnosis present

## 2023-12-08 DIAGNOSIS — Z79890 Hormone replacement therapy (postmenopausal): Secondary | ICD-10-CM

## 2023-12-08 DIAGNOSIS — M4854XA Collapsed vertebra, not elsewhere classified, thoracic region, initial encounter for fracture: Secondary | ICD-10-CM | POA: Diagnosis present

## 2023-12-08 DIAGNOSIS — G8314 Monoplegia of lower limb affecting left nondominant side: Secondary | ICD-10-CM | POA: Diagnosis present

## 2023-12-08 DIAGNOSIS — I129 Hypertensive chronic kidney disease with stage 1 through stage 4 chronic kidney disease, or unspecified chronic kidney disease: Secondary | ICD-10-CM | POA: Diagnosis present

## 2023-12-08 DIAGNOSIS — G8311 Monoplegia of lower limb affecting right dominant side: Secondary | ICD-10-CM | POA: Diagnosis present

## 2023-12-08 DIAGNOSIS — I82412 Acute embolism and thrombosis of left femoral vein: Secondary | ICD-10-CM | POA: Diagnosis present

## 2023-12-08 DIAGNOSIS — I63441 Cerebral infarction due to embolism of right cerebellar artery: Secondary | ICD-10-CM | POA: Diagnosis present

## 2023-12-08 DIAGNOSIS — Z91014 Allergy to mammalian meats: Secondary | ICD-10-CM

## 2023-12-08 DIAGNOSIS — C661 Malignant neoplasm of right ureter: Secondary | ICD-10-CM | POA: Diagnosis not present

## 2023-12-08 DIAGNOSIS — I6340 Cerebral infarction due to embolism of unspecified cerebral artery: Secondary | ICD-10-CM | POA: Diagnosis not present

## 2023-12-08 DIAGNOSIS — I63432 Cerebral infarction due to embolism of left posterior cerebral artery: Principal | ICD-10-CM | POA: Diagnosis present

## 2023-12-08 DIAGNOSIS — R29708 NIHSS score 8: Secondary | ICD-10-CM | POA: Diagnosis not present

## 2023-12-08 DIAGNOSIS — C7951 Secondary malignant neoplasm of bone: Secondary | ICD-10-CM | POA: Diagnosis present

## 2023-12-08 DIAGNOSIS — E1122 Type 2 diabetes mellitus with diabetic chronic kidney disease: Secondary | ICD-10-CM | POA: Diagnosis present

## 2023-12-08 DIAGNOSIS — T50995A Adverse effect of other drugs, medicaments and biological substances, initial encounter: Secondary | ICD-10-CM | POA: Diagnosis present

## 2023-12-08 DIAGNOSIS — Z91013 Allergy to seafood: Secondary | ICD-10-CM

## 2023-12-08 DIAGNOSIS — E039 Hypothyroidism, unspecified: Secondary | ICD-10-CM | POA: Diagnosis present

## 2023-12-08 DIAGNOSIS — E43 Unspecified severe protein-calorie malnutrition: Secondary | ICD-10-CM | POA: Diagnosis present

## 2023-12-08 DIAGNOSIS — I2510 Atherosclerotic heart disease of native coronary artery without angina pectoris: Secondary | ICD-10-CM | POA: Diagnosis present

## 2023-12-08 DIAGNOSIS — R296 Repeated falls: Secondary | ICD-10-CM | POA: Diagnosis present

## 2023-12-08 DIAGNOSIS — Z7984 Long term (current) use of oral hypoglycemic drugs: Secondary | ICD-10-CM

## 2023-12-08 DIAGNOSIS — R29711 NIHSS score 11: Secondary | ICD-10-CM | POA: Diagnosis not present

## 2023-12-08 DIAGNOSIS — M1990 Unspecified osteoarthritis, unspecified site: Secondary | ICD-10-CM | POA: Diagnosis present

## 2023-12-08 DIAGNOSIS — E860 Dehydration: Secondary | ICD-10-CM | POA: Diagnosis present

## 2023-12-08 DIAGNOSIS — I82452 Acute embolism and thrombosis of left peroneal vein: Secondary | ICD-10-CM | POA: Diagnosis not present

## 2023-12-08 LAB — COMPREHENSIVE METABOLIC PANEL WITH GFR
ALT: 20 U/L (ref 0–44)
AST: 33 U/L (ref 15–41)
Albumin: 3.2 g/dL — ABNORMAL LOW (ref 3.5–5.0)
Alkaline Phosphatase: 137 U/L — ABNORMAL HIGH (ref 38–126)
Anion gap: 13 (ref 5–15)
BUN: 35 mg/dL — ABNORMAL HIGH (ref 8–23)
CO2: 17 mmol/L — ABNORMAL LOW (ref 22–32)
Calcium: 8.5 mg/dL — ABNORMAL LOW (ref 8.9–10.3)
Chloride: 108 mmol/L (ref 98–111)
Creatinine, Ser: 2.93 mg/dL — ABNORMAL HIGH (ref 0.61–1.24)
GFR, Estimated: 20 mL/min — ABNORMAL LOW (ref >60)
Glucose, Bld: 125 mg/dL — ABNORMAL HIGH (ref 70–99)
Potassium: 5.3 mmol/L — ABNORMAL HIGH (ref 3.5–5.1)
Sodium: 138 mmol/L (ref 135–145)
Total Bilirubin: 1.2 mg/dL (ref 0.0–1.2)
Total Protein: 6.7 g/dL (ref 6.5–8.1)

## 2023-12-08 LAB — TSH: TSH: 10.559 u[IU]/mL — ABNORMAL HIGH (ref 0.350–4.500)

## 2023-12-08 LAB — CBC WITH DIFFERENTIAL/PLATELET
Abs Immature Granulocytes: 0.09 K/uL — ABNORMAL HIGH (ref 0.00–0.07)
Basophils Absolute: 0 K/uL (ref 0.0–0.1)
Basophils Relative: 0 %
Eosinophils Absolute: 0.1 K/uL (ref 0.0–0.5)
Eosinophils Relative: 1 %
HCT: 32.5 % — ABNORMAL LOW (ref 39.0–52.0)
Hemoglobin: 10.4 g/dL — ABNORMAL LOW (ref 13.0–17.0)
Immature Granulocytes: 1 %
Lymphocytes Relative: 7 %
Lymphs Abs: 0.5 K/uL — ABNORMAL LOW (ref 0.7–4.0)
MCH: 28.6 pg (ref 26.0–34.0)
MCHC: 32 g/dL (ref 30.0–36.0)
MCV: 89.3 fL (ref 80.0–100.0)
Monocytes Absolute: 0.6 K/uL (ref 0.1–1.0)
Monocytes Relative: 9 %
Neutro Abs: 5.3 K/uL (ref 1.7–7.7)
Neutrophils Relative %: 82 %
Platelets: 150 K/uL (ref 150–400)
RBC: 3.64 MIL/uL — ABNORMAL LOW (ref 4.22–5.81)
RDW: 15.3 % (ref 11.5–15.5)
WBC: 6.5 K/uL (ref 4.0–10.5)
nRBC: 0 % (ref 0.0–0.2)

## 2023-12-08 LAB — PROTIME-INR
INR: 1.4 — ABNORMAL HIGH (ref 0.8–1.2)
Prothrombin Time: 17.9 s — ABNORMAL HIGH (ref 11.4–15.2)

## 2023-12-08 LAB — I-STAT CHEM 8, ED
BUN: 33 mg/dL — ABNORMAL HIGH (ref 8–23)
Calcium, Ion: 1.14 mmol/L — ABNORMAL LOW (ref 1.15–1.40)
Chloride: 111 mmol/L (ref 98–111)
Creatinine, Ser: 2.9 mg/dL — ABNORMAL HIGH (ref 0.61–1.24)
Glucose, Bld: 121 mg/dL — ABNORMAL HIGH (ref 70–99)
HCT: 34 % — ABNORMAL LOW (ref 39.0–52.0)
Hemoglobin: 11.6 g/dL — ABNORMAL LOW (ref 13.0–17.0)
Potassium: 5.3 mmol/L — ABNORMAL HIGH (ref 3.5–5.1)
Sodium: 137 mmol/L (ref 135–145)
TCO2: 20 mmol/L — ABNORMAL LOW (ref 22–32)

## 2023-12-08 LAB — LIPID PANEL
Cholesterol: 138 mg/dL (ref 0–200)
HDL: 64 mg/dL (ref >40)
LDL Cholesterol: 58 mg/dL (ref 0–99)
Total CHOL/HDL Ratio: 2.2 ratio
Triglycerides: 78 mg/dL (ref <150)
VLDL: 16 mg/dL (ref 0–40)

## 2023-12-08 LAB — I-STAT CG4 LACTIC ACID, ED: Lactic Acid, Venous: 0.7 mmol/L (ref 0.5–1.9)

## 2023-12-08 MED ORDER — HEPARIN SODIUM (PORCINE) 5000 UNIT/ML IJ SOLN: 5000.0000 [IU] | Freq: Three times a day (TID) | INTRAMUSCULAR | Status: DC

## 2023-12-08 MED ORDER — DIAZEPAM 5 MG/ML IJ SOLN: 2.5000 mg | Freq: Once | INTRAMUSCULAR | Status: AC

## 2023-12-08 MED ORDER — LEVOTHYROXINE SODIUM 100 MCG PO TABS
100.0000 ug | ORAL_TABLET | Freq: Every day | ORAL | Status: DC
  Administered 2023-12-09 – 2023-12-14 (×7): 100 ug via ORAL
  Filled 2023-12-08 (×6): qty 1

## 2023-12-08 MED ORDER — INSULIN ASPART 100 UNIT/ML IJ SOLN
0.0000 [IU] | Freq: Three times a day (TID) | INTRAMUSCULAR | Status: DC
  Administered 2023-12-10: 2 [IU] via SUBCUTANEOUS
  Administered 2023-12-10 – 2023-12-11 (×3): 1 [IU] via SUBCUTANEOUS
  Administered 2023-12-11 – 2023-12-12 (×4): 2 [IU] via SUBCUTANEOUS
  Administered 2023-12-12 – 2023-12-13 (×3): 1 [IU] via SUBCUTANEOUS
  Administered 2023-12-13: 2 [IU] via SUBCUTANEOUS
  Administered 2023-12-14 (×2): 1 [IU] via SUBCUTANEOUS
  Administered 2023-12-14 (×2): 2 [IU] via SUBCUTANEOUS
  Administered 2023-12-14 – 2023-12-15 (×5): 1 [IU] via SUBCUTANEOUS
  Administered 2023-12-15: 2 [IU] via SUBCUTANEOUS
  Administered 2023-12-15: 1 [IU] via SUBCUTANEOUS
  Administered 2023-12-15 – 2023-12-17 (×9): 2 [IU] via SUBCUTANEOUS

## 2023-12-08 MED ORDER — SODIUM CHLORIDE 0.9% FLUSH
3.0000 mL | Freq: Two times a day (BID) | INTRAVENOUS | Status: DC
  Administered 2023-12-08 – 2023-12-16 (×19): 3 mL via INTRAVENOUS

## 2023-12-08 MED ORDER — BISACODYL 5 MG PO TBEC
5.0000 mg | DELAYED_RELEASE_TABLET | Freq: Every day | ORAL | Status: DC | PRN
  Administered 2023-12-17: 5 mg via ORAL
  Filled 2023-12-08: qty 1

## 2023-12-08 MED ORDER — SODIUM CHLORIDE 0.9 % IV BOLUS: 500.0000 mL | Freq: Once | INTRAVENOUS | Status: DC

## 2023-12-08 MED ORDER — ONDANSETRON HCL 4 MG/2ML IJ SOLN: 4.0000 mg | Freq: Four times a day (QID) | INTRAMUSCULAR | Status: DC | PRN

## 2023-12-08 MED ORDER — SODIUM CHLORIDE 0.9 % IV SOLN: INTRAVENOUS | Status: AC

## 2023-12-08 MED ORDER — ACETAMINOPHEN 650 MG RE SUPP: 650.0000 mg | Freq: Four times a day (QID) | RECTAL | Status: DC | PRN

## 2023-12-08 MED ORDER — GADOBUTROL 1 MMOL/ML IV SOLN
5.0000 mL | Freq: Once | INTRAVENOUS | Status: AC | PRN
  Administered 2023-12-08: 5 mL via INTRAVENOUS

## 2023-12-08 MED ORDER — OXYCODONE HCL 5 MG PO TABS
5.0000 mg | ORAL_TABLET | ORAL | Status: DC | PRN
  Administered 2023-12-12 – 2023-12-14 (×8): 5 mg via ORAL
  Filled 2023-12-08 (×9): qty 1

## 2023-12-08 MED ORDER — ALBUTEROL SULFATE (2.5 MG/3ML) 0.083% IN NEBU: 2.5000 mg | INHALATION_SOLUTION | RESPIRATORY_TRACT | Status: DC | PRN

## 2023-12-08 MED ORDER — CLOPIDOGREL BISULFATE 75 MG PO TABS
75.0000 mg | ORAL_TABLET | Freq: Every day | ORAL | Status: DC
  Administered 2023-12-08 – 2023-12-09 (×2): 75 mg via ORAL
  Filled 2023-12-08 (×2): qty 1

## 2023-12-08 MED ORDER — ASPIRIN 81 MG PO TBEC
81.0000 mg | DELAYED_RELEASE_TABLET | Freq: Every day | ORAL | Status: DC
  Administered 2023-12-08 – 2023-12-09 (×2): 81 mg via ORAL
  Filled 2023-12-08 (×2): qty 1

## 2023-12-08 MED ORDER — ATORVASTATIN CALCIUM 40 MG PO TABS
40.0000 mg | ORAL_TABLET | Freq: Every day | ORAL | Status: DC
  Administered 2023-12-08 – 2023-12-17 (×13): 40 mg via ORAL
  Filled 2023-12-08 (×10): qty 1

## 2023-12-08 MED ORDER — SENNOSIDES-DOCUSATE SODIUM 8.6-50 MG PO TABS
1.0000 | ORAL_TABLET | Freq: Every evening | ORAL | Status: DC | PRN
  Administered 2023-12-12: 1 via ORAL
  Filled 2023-12-08: qty 1

## 2023-12-08 MED ORDER — METHOCARBAMOL 1000 MG/10ML IJ SOLN
500.0000 mg | Freq: Four times a day (QID) | INTRAMUSCULAR | Status: DC | PRN
  Administered 2023-12-12 – 2023-12-13 (×2): 500 mg via INTRAVENOUS
  Filled 2023-12-08 (×2): qty 10

## 2023-12-08 MED ORDER — ACETAMINOPHEN 325 MG PO TABS
650.0000 mg | ORAL_TABLET | Freq: Four times a day (QID) | ORAL | Status: DC | PRN
  Administered 2023-12-12 – 2023-12-14 (×6): 650 mg via ORAL
  Filled 2023-12-08 (×5): qty 2

## 2023-12-08 MED ORDER — HEPARIN SODIUM (PORCINE) 5000 UNIT/ML IJ SOLN
5000.0000 [IU] | Freq: Three times a day (TID) | INTRAMUSCULAR | Status: DC
  Administered 2023-12-09: 5000 [IU] via SUBCUTANEOUS
  Filled 2023-12-08: qty 1

## 2023-12-08 MED ORDER — ONDANSETRON HCL 4 MG PO TABS: 4.0000 mg | ORAL_TABLET | Freq: Four times a day (QID) | ORAL | Status: DC | PRN

## 2023-12-08 MED ORDER — DIAZEPAM 5 MG/ML IJ SOLN
INTRAMUSCULAR | Status: AC
  Administered 2023-12-08: 2.5 mg via INTRAVENOUS
  Filled 2023-12-08: qty 2

## 2023-12-08 NOTE — Consult Note (Signed)
 NEUROLOGY CONSULT NOTE   Date of service: December 08, 2023 Patient Name: Marco [name] MRN:  [id number] DOB:  17-May-1933 Chief Complaint: Falls, weakness, abnormal CT head Requesting Provider: Lenor Hollering, MD  History of Present Illness  Marco [name] is a 89 y.o. male with hx of prostate cancer, diabetes, hyperlipidemia, coronary disease, documented history of amaurosis fugax presented for increasing lower extremity weakness and inability to walk over the past 7 to 10 days.  Unclear last known well but not within the last 24 hours. At baseline he was able to stand and bear his weight but over the past week he has not been able to do so.  He had radiation on 1 week ago for his prostate cancer with mets to the bone. He is not able to provide much history at this time.  Son was at bedside presumably in the ED but was not at bedside upon my arrival.  LKW: 7 to 10 days ago Modified rankin score: 4-Needs assistance to walk and tend to bodily needs IV Thrombolysis: Outside the window EVT: Outside window   NIHSS components Score: Comment  1a Level of Conscious 0[x]  1[]  2[]  3[]      1b LOC Questions 0[x]  1[]  2[]       1c LOC Commands 0[x]  1[]  2[]       2 Best Gaze 0[x]  1[]  2[]       3 Visual 0[]  1[]  2[x]  3[]      4 Facial Palsy 0[x]  1[]  2[]  3[]      5a Motor Arm - left 0[x]  1[]  2[]  3[]  4[]  UN[]    5b Motor Arm - Right 0[]  1[x]  2[]  3[]  4[]  UN[]    6a Motor Leg - Left 0[]  1[]  2[]  3[x]  4[]  UN[]    6b Motor Leg - Right 0[]  1[]  2[]  3[x]  4[]  UN[]    7 Limb Ataxia 0[x]  1[]  2[]  UN[]      8 Sensory 0[x]  1[]  2[]  UN[]      9 Best Language 0[x]  1[]  2[]  3[]      10 Dysarthria 0[]  1[]  2[x]  UN[]      11 Extinct. and Inattention 0[x]  1[]  2[]       TOTAL: 11      ROS  Comprehensive ROS performed and pertinent positives documented in HPI    Past History   Past Medical History:  Diagnosis Date   Amaurosis fugax 2017   Diabetes mellitus    DJD (degenerative joint disease) 08/25/2013   Elevated PSA     Other and unspecified hyperlipidemia 04/25/2013   Prostate cancer [facility])     Past Surgical History:  Procedure Laterality Date   CATARACT EXTRACTION     Bilaterally   FRACTURE SURGERY Right    rod 2020   INTRAMEDULLARY (IM) NAIL INTERTROCHANTERIC Left 09/27/2023   Procedure: FIXATION, FRACTURE, INTERTROCHANTERIC, WITH INTRAMEDULLARY ROD;  Surgeon: Georgina Ozell LABOR, MD;  Location: [facility] OR;  Service: Orthopedics;  Laterality: Left;   TYMPANIC MEMBRANE REPAIR     R    Family History: Family History  Problem Relation Age of Onset   Other Mother        Unknown    Other Father        Unknown   Colon cancer Nephew 35   Diabetes Neg Hx    CAD Neg Hx     Social History  reports that he has never smoked. He has never used smokeless tobacco. He reports that he does not drink alcohol  and does not use drugs.  Allergies  Allergen Reactions   Beef-Derived  Drug Products     vegetarian   Chicken Allergy    Fish Allergy    Pork-Derived Products     Medications  No current facility-administered medications for this encounter.  Current Outpatient Medications:    Accu-Chek Softclix Lancets lancets, CHECK BLOOD SUGAR ONCE DAILY AS  DIRECTED, Disp: 100 each, Rfl: 12   Alcohol  Swabs  PADS, Use to clean finger prior to testing blood sugars, Disp: 100 each, Rfl: 1   alendronate  (FOSAMAX ) 70 MG tablet, Take 1 tablet (70 mg total) by mouth once a week. Take with full glass of water on empty stomach, Disp: 12 tablet, Rfl: 3   atorvastatin  (LIPITOR) 40 MG tablet, Take 1 tablet (40 mg total) by mouth at bedtime., Disp: 90 tablet, Rfl: 1   Blood Glucose Calibration (ACCU-CHEK GUIDE CONTROL) LIQD, Use as directed, Disp: 1 each, Rfl: 3   Blood Glucose Monitoring Suppl (ACCU-CHEK GUIDE) w/Device KIT, Check blood sugars once daily, Disp: 1 kit, Rfl: 0   Calcium  Carb-Cholecalciferol (CALCIUM  500 + D) 500-125 MG-UNIT TABS, Take 1 tablet by mouth daily., Disp: , Rfl:    Cyanocobalamin  (VITAMIN B-12 PO), Take  2,500 mcg by mouth daily., Disp: , Rfl:    glucose blood (ACCU-CHEK GUIDE) test strip, USE TO CHECK BLOOD SUGAR ONCE  DAILY, Disp: 100 strip, Rfl: 12   levothyroxine  (SYNTHROID ) 100 MCG tablet, Take 1 tablet (100 mcg total) by mouth daily before breakfast., Disp: 90 tablet, Rfl: 1   metFORMIN  (GLUCOPHAGE ) 500 MG tablet, Take 1 tablet (500 mg total) by mouth daily with breakfast., Disp: 90 tablet, Rfl: 1   Multiple Vitamin (MULTI-VITAMIN DAILY PO), Take 1 tablet by mouth daily., Disp: , Rfl:    ondansetron  (ZOFRAN ) 4 MG tablet, Take 1 tablet (4 mg total) by mouth every 8 (eight) hours as needed for nausea or vomiting., Disp: 20 tablet, Rfl: 0   sodium zirconium cyclosilicate  (LOKELMA ) 10 g PACK packet, MIX 1 PACKET IN AT LEAST 3  TABLESPOONS OF WATER STIR AND  DRINK IMMEDIATELY EVERY OTHER  DAY, Disp: 90 each, Rfl: 0   XTANDI  40 MG tablet, Take 120 mg by mouth daily., Disp: , Rfl:   Vitals   Vitals:   12/08/23 1430 12/08/23 1445 12/08/23 1500 12/08/23 1515  BP:  134/86 (!) 142/69   Pulse: 99 92 91 96  Resp: 17 14 15 17   Temp:      TempSrc:      SpO2: 100% 100% 100% 100%  Weight:      Height:        Body mass index is 20.11 kg/m.   Physical Exam  General: Awake alert in no distress HEENT: Normocephalic/atraumatic Lungs: Clear Cardiovascular: Regular rhythm Abdomen nondistended nontender Neurological exam Awake alert oriented x 3 Moderate to severe dysarthria, no gross aphasia, diminished attention concentration Cranial nerves: Pupils equal round react light, extraocular movements appear grossly unhindered, visual field examination shows right homonymous hemianopsia, face appears grossly symmetric, tongue and palate midline. Motor examination with no drift in the left upper extremity, there is not much of a drift but there is definitely restricted range of motion in the right upper extremity with mild asterixis on outstretched arm.  Both lower extremities are barely 2/5-exam severely  limited by pain. Sensation intact light touch Coordination examination with no gross dysmetria in the upper extremities.  Unable to perform the lower extremities  Labs/Imaging/Neurodiagnostic studies   CBC:  Recent Labs  Lab 2023/12/15 1220  WBC 6.9  NEUTROABS 4.3  HGB 11.1*  HCT 36.5*  MCV 90.1  PLT 169   Basic Metabolic Panel:  Lab Results  Component Value Date   NA 134 (L) 12/02/2023   K 4.8 12/02/2023   CO2 21 (L) 12/02/2023   GLUCOSE 155 (H) 12/02/2023   BUN 25 (H) 12/02/2023   CREATININE 1.52 (H) 12/02/2023   CALCIUM  9.3 12/02/2023   GFRNONAA 44 (L) 12/02/2023   GFRAA >60 11/21/2016   Lipid Panel:  Lab Results  Component Value Date   LDLCALC 57 09/19/2022   HgbA1c:  Lab Results  Component Value Date   HGBA1C 7.4 (H) 07/10/2023   Urine Drug Screen: No results found for: LABOPIA, COCAINSCRNUR, LABBENZ, AMPHETMU, THCU, LABBARB  Alcohol  Level     Component Value Date/Time   ETH <10 02/11/2022 1940   INR  Lab Results  Component Value Date   INR 1.0 09/27/2023     CT Head without contrast(Personally reviewed): Late subacute infarct in the left occipital lobe and new subacute lacunar infarcts in the left thalamus.  Mild to moderate periventricular white matter disease.  Calcification within the cavernous carotids.     ASSESSMENT   Marco [name] is a 89 y.o. male past history of prostate cancer, diabetes, hyperlipidemia, coronary artery disease, history of emesis fugax, presenting for worsening gait and lower extremity weakness ongoing for 7 to 10 days.  CT head with subacute infarct in the left occipital lobe and left thalamus-concerning for a PCA occlusion. Will need admission for further workup. Etiology of the stroke cryptogenic for now.  Impression: Acute ischemic infarct-etiology under investigation  RECOMMENDATIONS  Admit to hospitalist Frequent neurochecks Telemetry CTA head and neck MRI brain with and without contrast 2D  echo Hemoglobin A1c Fasting lipid panel Physical therapy assessment Speech therapy assessment Occupational Therapy assessment N.p.o. until cleared by bedside swallow evaluation a formal swallow evaluation Aspirin  81+ Plavix  75. Continue home statin atorvastatin  40 (Goal A1c is less than 7 and goal LDL less than 70.)  Stroke team to follow.  Plan discussed with Warren Shad, PA-C, EDP ______________________________________________________________________    Bonney Eligio Lav, MD [facility]

## 2023-12-08 NOTE — ED Notes (Signed)
 Pt in MRI, family member in new room waiting for patient to return.

## 2023-12-08 NOTE — ED Provider Notes (Signed)
 [place] EMERGENCY DEPARTMENT AT [facility] Provider Note   CSN: [id number] Arrival date & time: 12/08/23  1200     Patient presents with: Weakness   Marco [name] is a 89 y.o. male with past medical history of prostate cancer, amaurosis fugax, diabetes, hyperlipidemia, coronary artery disease presents to emergency room today with family with complaint of fall.  Patient's family reports that over the last week he has had increasing lower extremity weakness and inability to walk.  They note that he has history of bilateral hip fractures thus it is always difficult for him to ambulate.  They report he is so weak he is no longer able to stand which she can do at baseline. Say his mention appears to be at baseline.  No ASA, no BT. Radiation tx 1 week ago. Has prostate cancer with mets to bone.     Weakness      Prior to Admission medications   Medication Sig Start Date End Date Taking? Authorizing Provider  Accu-Chek Softclix Lancets lancets CHECK BLOOD SUGAR ONCE DAILY AS  DIRECTED 10/24/22   Amon Aloysius BRAVO, MD  Alcohol  Swabs  PADS Use to clean finger prior to testing blood sugars 03/13/21   Amon Aloysius BRAVO, MD  alendronate  (FOSAMAX ) 70 MG tablet Take 1 tablet (70 mg total) by mouth once a week. Take with full glass of water on empty stomach 10/15/23   Amon Aloysius BRAVO, MD  atorvastatin  (LIPITOR) 40 MG tablet Take 1 tablet (40 mg total) by mouth at bedtime. 11/27/23   Paz, Jose E, MD  Blood Glucose Calibration (ACCU-CHEK GUIDE CONTROL) LIQD Use as directed 12/24/20   Paz, Jose E, MD  Blood Glucose Monitoring Suppl (ACCU-CHEK GUIDE) w/Device KIT Check blood sugars once daily 12/03/23   Paz, Jose E, MD  Calcium  Carb-Cholecalciferol (CALCIUM  500 + D) 500-125 MG-UNIT TABS Take 1 tablet by mouth daily.    [provider]  Cyanocobalamin  (VITAMIN B-12 PO) Take 2,500 mcg by mouth daily.    [provider]  glucose blood (ACCU-CHEK GUIDE) test strip USE TO CHECK BLOOD SUGAR ONCE   DAILY 10/24/22   Paz, Jose E, MD  levothyroxine  (SYNTHROID ) 100 MCG tablet Take 1 tablet (100 mcg total) by mouth daily before breakfast. 11/27/23   Paz, Jose E, MD  metFORMIN  (GLUCOPHAGE ) 500 MG tablet Take 1 tablet (500 mg total) by mouth daily with breakfast. 11/27/23   Paz, Jose E, MD  Multiple Vitamin (MULTI-VITAMIN DAILY PO) Take 1 tablet by mouth daily.    [provider]  ondansetron  (ZOFRAN ) 4 MG tablet Take 1 tablet (4 mg total) by mouth every 8 (eight) hours as needed for nausea or vomiting. 12/02/23   Emerson, Abigail C, PA-C  sodium zirconium cyclosilicate  (LOKELMA ) 10 g PACK packet MIX 1 PACKET IN AT LEAST 3  TABLESPOONS OF WATER STIR AND  DRINK IMMEDIATELY EVERY OTHER  DAY 10/30/23   Paz, Jose E, MD  XTANDI  40 MG tablet Take 120 mg by mouth daily. 07/12/21   [provider]    Allergies: Beef-derived drug products, Chicken allergy, Fish allergy, and Pork-derived products    Review of Systems  Neurological:  Positive for weakness.    Updated Vital Signs There were no vitals taken for this visit.  Physical Exam Vitals and nursing note reviewed.  Constitutional:      General: He is not in acute distress.    Appearance: He is ill-appearing. He is not toxic-appearing.  HENT:  Head: Normocephalic and atraumatic.  Eyes:     General: No scleral icterus.    Conjunctiva/sclera: Conjunctivae normal.  Cardiovascular:     Rate and Rhythm: Normal rate and regular rhythm.     Pulses: Normal pulses.     Heart sounds: Normal heart sounds.  Pulmonary:     Effort: Pulmonary effort is normal. No respiratory distress.     Breath sounds: Normal breath sounds.  Abdominal:     General: Abdomen is flat. Bowel sounds are normal.     Palpations: Abdomen is soft.     Tenderness: There is no abdominal tenderness.  Musculoskeletal:     Comments: Pupils equal and reactive.  No obvious facial droop. Patient can squeeze my hands bilaterally.  Radial pulse equal bilaterally.   Sensation intact. Patient is able to wiggle both feet.  Unable to hold either leg up against gravity.  Unable to initiate dorsiflexion or plantarflexion.  Sensation is intact bilaterally.  Strong dorsal pedal pulse equal bilaterally.  Skin:    General: Skin is warm and dry.     Findings: No lesion.  Neurological:     General: No focal deficit present.     Mental Status: He is alert and oriented to person, place, and time. Mental status is at baseline.     (all labs ordered are listed, but only abnormal results are displayed) Labs Reviewed - No data to display  EKG: None  Radiology: No results found.   Procedures   Medications Ordered in the ED - No data to display  Clinical Course as of 12/08/23 1958  Tue Dec 08, 2023  1505 Brain w and wo  Cta head neck Hosp admit Neuro will see [JB]    Clinical Course User Index [JB] Kaelah Hayashi, Warren SAILOR, PA-C                                 Medical Decision Making Amount and/or Complexity of Data Reviewed Labs: ordered. Radiology: ordered.  Risk Prescription drug management. Decision regarding hospitalization.   This patient presents to the ED for concern of weakness, this involves an extensive number of treatment options, and is a complaint that carries with it a high risk of complications and morbidity.  The differential diagnosis includes VA, AKI, dehydration, electrolyte abnormality   Co morbidities that complicate the patient evaluation  Cancer   Lab Tests:  I personally interpreted labs.  The pertinent results include:   She has no leukocytosis.  Hemoglobin 10. CMP is showing potassium of 5.3, will give fluids creatinine increased double since prior recent labs to 2.9 with GFR of 20   Imaging Studies ordered:  I ordered imaging studies including head CT which showed subacute infarct.  Will order angio and brain MRI with and without contrast to further characterize.  Chest x-ray no acute findings.  Lumbar spine  question L2 compression fracture.  Hip x-ray showing no obvious acute fracture questioning possible chronic or small subacute fracture.  Ordering CT scan of lumbar spine and pelvis to further characterize fractures.   Cardiac Monitoring: / EKG:  The patient was maintained on a cardiac monitor.   Consultations Obtained:  I requested consultation with the neurology,  and discussed lab and imaging findings as well as pertinent plan - they recommend: CT angio and brain MRI with and without contrast given history of cancer.   Problem List / ED Course / Critical interventions / Medication management  Patient reporting to emergency room with complaint of weakness and fall.  He has bilateral lower extremity weakness but sensation is intact.  He is able to follow basic commands and answer basic questions.  His vital signs are normal.  Not febrile, did check rectal temperature which is normal.  He has no infectious symptoms.  Chest x-ray shows no infection.  CT scan of head does show infarct, neurology was consulted.  Will get advanced imaging to further characterize.  Given fluids for AKI.  Discussed admission with the admitting team. I ordered medication including 500 NS Reevaluation of the patient after these medicines showed that the patient improved I have reviewed the patients home medicines and have made adjustments as needed       Final diagnoses:  Cerebrovascular accident (CVA), unspecified mechanism (HCC)  Fall, initial encounter  Closed compression fracture of L2 vertebra, initial encounter [facility])  AKI (acute kidney injury) [facility])    ED Discharge Orders     None          Shermon Warren LOISE DEVONNA 12/08/23 2003    Lenor Hollering, MD 12/09/23 [phone]

## 2023-12-08 NOTE — H&P (Addendum)
 History and Physical    Patient: Marco [name] FMW:[id number] DOB: 11/30/1933 DOA: 12/08/2023 DOS: the patient was seen and examined on 12/08/2023 PCP: Amon Aloysius BRAVO, MD  Patient coming from: Home  Chief Complaint:  Chief Complaint  Patient presents with   Weakness   HPI: Marco [name] is a 89 y.o. 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  Most of the history is taken from patient's son who was present at bedside.  As per patient's son, patient has been progressively getting weaker over the past 10 days but for the past 7 days, weakness has gotten worse.  Patient had multiple falls at home with the most recent fall of yesterday night.  Patient refused to come to the emergency room for further evaluation yesterday night but today he was found to be in feces and urine so patient's son called 911 and he was brought to the hospital.  Patient recently had right hip surgery done and rods placement back in May 2025 and was sent to a rehab placement for 4 weeks.  He was discharged from the rehab to home and was getting home health physical therapy.  Patient does have advanced prostate cancer and follows up outpatient with a urologist as well as oncologist.  Patient received dose of Prevagen immunotherapy last week and has been progressively getting worse in terms of his physical condition and mentation.  I also spoke to   Upon arrival, CT of the brain was done which showed evidence of subacute ischemic infarcts.  Neurology was consulted and recommendation is to get a CT angio of the head and neck along with MRI of the brain.    I also spoke to  patient's daughter on the phone who lives in [place] and updated her on patient's medical condition.   Review of Systems: As mentioned in the history of present illness. All other systems reviewed and are negative. Past Medical History:  Diagnosis Date   Amaurosis fugax 2017   Diabetes mellitus    DJD (degenerative joint disease) 08/25/2013   Elevated PSA    Other and unspecified hyperlipidemia 04/25/2013   Prostate cancer [facility])    Past Surgical History:  Procedure Laterality Date   CATARACT EXTRACTION     Bilaterally   FRACTURE SURGERY Right    rod 2020   INTRAMEDULLARY (IM) NAIL INTERTROCHANTERIC Left 09/27/2023   Procedure: FIXATION, FRACTURE, INTERTROCHANTERIC, WITH INTRAMEDULLARY ROD;  Surgeon: Georgina Ozell LABOR, MD;  Location: [facility] OR;  Service: Orthopedics;  Laterality: Left;   TYMPANIC MEMBRANE REPAIR     R   Social History:  reports that he has never smoked. He has never used smokeless tobacco. He reports that he does not drink alcohol  and does not use drugs.  Allergies  Allergen Reactions   Beef-Derived Drug Products     vegetarian   Chicken Allergy    Fish Allergy    Pork-Derived Products     Family History  Problem Relation Age of Onset   Other Mother        Unknown    Other Father        Unknown   Colon cancer Nephew 35   Diabetes Neg Hx    CAD Neg  Hx     Prior to Admission medications   Medication Sig Start Date End Date Taking? Authorizing Provider  Accu-Chek Softclix Lancets lancets CHECK BLOOD SUGAR ONCE DAILY AS  DIRECTED 10/24/22   Amon Aloysius BRAVO, MD  Alcohol  Swabs  PADS Use to clean finger prior to testing blood sugars 03/13/21   Amon Aloysius BRAVO, MD  alendronate  (FOSAMAX ) 70 MG tablet Take 1 tablet (70 mg total) by mouth once a week. Take with full glass of water on empty stomach 10/15/23   Amon Aloysius BRAVO, MD  atorvastatin  (LIPITOR) 40 MG tablet Take 1 tablet (40 mg total) by mouth at bedtime. 11/27/23   Paz, Jose E, MD  Blood Glucose Calibration (ACCU-CHEK GUIDE  CONTROL) LIQD Use as directed 12/24/20   Paz, Jose E, MD  Blood Glucose Monitoring Suppl (ACCU-CHEK GUIDE) w/Device KIT Check blood sugars once daily 12/03/23   Paz, Jose E, MD  Calcium  Carb-Cholecalciferol (CALCIUM  500 + D) 500-125 MG-UNIT TABS Take 1 tablet by mouth daily.    [provider]  Cyanocobalamin  (VITAMIN B-12 PO) Take 2,500 mcg by mouth daily.    [provider]  glucose blood (ACCU-CHEK GUIDE) test strip USE TO CHECK BLOOD SUGAR ONCE  DAILY 10/24/22   Paz, Jose E, MD  levothyroxine  (SYNTHROID ) 100 MCG tablet Take 1 tablet (100 mcg total) by mouth daily before breakfast. 11/27/23   Paz, Jose E, MD  metFORMIN  (GLUCOPHAGE ) 500 MG tablet Take 1 tablet (500 mg total) by mouth daily with breakfast. 11/27/23   Paz, Jose E, MD  Multiple Vitamin (MULTI-VITAMIN DAILY PO) Take 1 tablet by mouth daily.    [provider]  ondansetron  (ZOFRAN ) 4 MG tablet Take 1 tablet (4 mg total) by mouth every 8 (eight) hours as needed for nausea or vomiting. 12/02/23   Emerson, Abigail C, PA-C  sodium zirconium cyclosilicate  (LOKELMA ) 10 g PACK packet MIX 1 PACKET IN AT LEAST 3  TABLESPOONS OF WATER STIR AND  DRINK IMMEDIATELY EVERY OTHER  DAY 10/30/23   Paz, Jose E, MD  XTANDI  40 MG tablet Take 120 mg by mouth daily. 07/12/21   [provider]    Physical Exam: Vitals:   12/08/23 1445 12/08/23 1500 12/08/23 1515 12/08/23 1639  BP: 134/86 (!) 142/69    Pulse: 92 91 96   Resp: 14 15 17    Temp:    97.8 F (36.6 C)  TempSrc:    Oral  SpO2: 100% 100% 100%   Weight:      Height:       Constitutional:in slight distress, AAOx2 Eyes: PERRL, lids and conjunctivae normal ENMT: Mucous membranes are moist. Posterior pharynx clear of any exudate or lesions.Normal dentition.  Neck: normal, supple, no masses, no thyromegaly Respiratory: clear to auscultation bilaterally, no wheezing, no crackles. Normal respiratory effort. No accessory muscle use.  Cardiovascular: Regular rate and  rhythm, no murmurs / rubs / gallops. No extremity edema. 2+ pedal pulses. No carotid bruits.  Abdomen: no tenderness, no masses palpated. No hepatosplenomegaly. Bowel sounds positive.  Musculoskeletal: no clubbing / cyanosis. No joint deformity upper and lower extremities.  Skin: no rashes, lesions, ulcers. No induration Neurologic: AAOx2. Power 2/5 in lower extremities and 3/5 in upper extremities  Data Reviewed:  There are no new results to review at this time.  Assessment and Plan: No notes have been filed under this hospital service. Service: Hospitalist  89 years old male with past medical history of multiple comorbidities, brought to the ED by EMS with  multiple complaints including lower extremity weakness, physical deconditioning as well as multiple falls at home over the past few days.   Acute ischemic CVA, POA: CT head was done which showed evidence of late subacute infarct of the left occipital lobe as well as new new subacute infarcts of the lateral thalamus. Follow-up MRA head and neck Follow-up MRI of the brain Continue with aspirin  and statin along with Plavix  Neurology on board, appreciate assistance Risk stratification with TSH, hemoglobin A1c and lipid panel Needs speech therapy/physical therapy/Occupational Therapy evaluation Patient will likely need skilled nursing facility  Non-insulin  dependent type 2 diabetes mellitus, POA: Patient takes metformin  at home.  Will hold that in the setting of AKI superimposed on chronic kidney disease.  Continue with sliding scale insulin .  Follow-up hemoglobin A1c.  Essential hypertension, POA: Permissive hypertension in the setting of acute/subacute stroke.  Avoid scheduled antihypertensives at this point.  Acute kidney injury superimposed on CKD stage IIIa, POA: AKI likely in the setting of dehydration, prerenal injury in the setting of poor oral intake.  Continue with intravenous fluids and follow BMP in the morning.  Of note,  patient does have advanced prostate cancer. Follow-up ultrasound of the kidneys to rule out obstructive uropathy. Strict intake output monitoring Follow-up sodium and osmolality May need nephrology consultation if kidney function gets worse Holding metformin  Avoid nephrotoxic medications  Advanced/stage IV prostate cancer with mets to the bone, POA: Patient follows up outpatient with oncology.  He received a dose of Prevagen immunotherapy last week.  In the past, patient has received zoledronate intramuscular injections. Outpatient follow-up with oncology  Hypothyroidism: continue with synthroid   Recent Left hip fracture s/p surgery: back in May 2025. Continue with PT OT.  DVT prophylaxis: Heparin   Disposition: Patient was living at home with his son.  As per son, he is unable to take care of the patient because of multiple falls, physical deconditioning, advanced prostate cancer as well as worsening cognition.  Case management and PT will be consulted and patient will need long-term skilled nursing facility placement.   Advance Care Planning:   Code Status: Prior full code  Consults: Neurology  Family Communication: Case discussed in length with the patient son at the bedside.  I also spoke with the patient's daughter on the phone who lives in [place] and updated her on patient's medical condition and further management plan.  Severity of Illness: The appropriate patient status for this patient is INPATIENT. Inpatient status is judged to be reasonable and necessary in order to provide the required intensity of service to ensure the patient's safety. The patient's presenting symptoms, physical exam findings, and initial radiographic and laboratory data in the context of their chronic comorbidities is felt to place them at high risk for further clinical deterioration. Furthermore, it is not anticipated that the patient will be medically stable for discharge from the hospital within 2  midnights of admission.   * I certify that at the point of admission it is my clinical judgment that the patient will require inpatient hospital care spanning beyond 2 midnights from the point of admission due to high intensity of service, high risk for further deterioration and high frequency of surveillance required.*  Author: Deliliah Room, MD 12/08/2023 5:32 PM  For on call review [email or web address].

## 2023-12-08 NOTE — ED Notes (Signed)
 CCMD called and notified

## 2023-12-08 NOTE — ED Notes (Signed)
 2nd lac not needed per MD- Dino

## 2023-12-08 NOTE — ED Triage Notes (Signed)
 Pt bib [facility] from home. Family reports weakness that started last night and has gotten worse. Pt unable to stand, can normally ambulate with a walker. A&Ox2

## 2023-12-08 NOTE — ED Notes (Signed)
 Phleb stuck twice, was unable to get any blood

## 2023-12-08 NOTE — Telephone Encounter (Signed)
 Januvia  rx denied. No longer on med list.

## 2023-12-08 NOTE — Telephone Encounter (Signed)
 Received PC from family member of this patient - she states patient had radiation last week & is not feeling well.  She thinks he may have injured his back and has hip fx from 2 months ago.  She states the patient cannot get up to the BR & has urinated allover his bed & carpet.  She also states she has called EMS & they are there presently to evaluate patient.  This person is asking if the patient can have a urinary catheter since he is unable to walk to the BR.  Suggested that an easier solution may be for the patient to use a urinal, she states he has one but will not use it.  Informed family member that patient should go with EMS to the hospital to be evaluated for his inability to walk, she verbalizes understanding.

## 2023-12-09 ENCOUNTER — Inpatient Hospital Stay (HOSPITAL_COMMUNITY)

## 2023-12-09 DIAGNOSIS — I6340 Cerebral infarction due to embolism of unspecified cerebral artery: Secondary | ICD-10-CM

## 2023-12-09 DIAGNOSIS — I63432 Cerebral infarction due to embolism of left posterior cerebral artery: Secondary | ICD-10-CM

## 2023-12-09 DIAGNOSIS — I63441 Cerebral infarction due to embolism of right cerebellar artery: Secondary | ICD-10-CM

## 2023-12-09 DIAGNOSIS — I639 Cerebral infarction, unspecified: Secondary | ICD-10-CM | POA: Diagnosis not present

## 2023-12-09 DIAGNOSIS — D6869 Other thrombophilia: Secondary | ICD-10-CM

## 2023-12-09 DIAGNOSIS — C661 Malignant neoplasm of right ureter: Secondary | ICD-10-CM | POA: Diagnosis not present

## 2023-12-09 DIAGNOSIS — I6389 Other cerebral infarction: Secondary | ICD-10-CM | POA: Diagnosis not present

## 2023-12-09 DIAGNOSIS — I82412 Acute embolism and thrombosis of left femoral vein: Secondary | ICD-10-CM

## 2023-12-09 DIAGNOSIS — C7951 Secondary malignant neoplasm of bone: Secondary | ICD-10-CM | POA: Diagnosis not present

## 2023-12-09 DIAGNOSIS — I82452 Acute embolism and thrombosis of left peroneal vein: Secondary | ICD-10-CM

## 2023-12-09 LAB — BASIC METABOLIC PANEL WITH GFR
Anion gap: 13 (ref 5–15)
BUN: 36 mg/dL — ABNORMAL HIGH (ref 8–23)
CO2: 16 mmol/L — ABNORMAL LOW (ref 22–32)
Calcium: 8.3 mg/dL — ABNORMAL LOW (ref 8.9–10.3)
Chloride: 110 mmol/L (ref 98–111)
Creatinine, Ser: 2.86 mg/dL — ABNORMAL HIGH (ref 0.61–1.24)
GFR, Estimated: 21 mL/min — ABNORMAL LOW (ref >60)
Glucose, Bld: 106 mg/dL — ABNORMAL HIGH (ref 70–99)
Potassium: 5.1 mmol/L (ref 3.5–5.1)
Sodium: 139 mmol/L (ref 135–145)

## 2023-12-09 LAB — CBC
HCT: 31.6 % — ABNORMAL LOW (ref 39.0–52.0)
Hemoglobin: 10.1 g/dL — ABNORMAL LOW (ref 13.0–17.0)
MCH: 28.5 pg (ref 26.0–34.0)
MCHC: 32 g/dL (ref 30.0–36.0)
MCV: 89 fL (ref 80.0–100.0)
Platelets: 145 K/uL — ABNORMAL LOW (ref 150–400)
RBC: 3.55 MIL/uL — ABNORMAL LOW (ref 4.22–5.81)
RDW: 15.5 % (ref 11.5–15.5)
WBC: 5.7 K/uL (ref 4.0–10.5)
nRBC: 0 % (ref 0.0–0.2)

## 2023-12-09 LAB — OSMOLALITY, URINE: Osmolality, Ur: 391 mosm/kg (ref 300–900)

## 2023-12-09 LAB — HEPARIN LEVEL (UNFRACTIONATED): Heparin Unfractionated: 0.67 [IU]/mL (ref 0.30–0.70)

## 2023-12-09 LAB — URINALYSIS, W/ REFLEX TO CULTURE (INFECTION SUSPECTED)
Bilirubin Urine: NEGATIVE
Glucose, UA: NEGATIVE mg/dL
Ketones, ur: 5 mg/dL — AB
Leukocytes,Ua: NEGATIVE
Nitrite: NEGATIVE
Protein, ur: 100 mg/dL — AB
Specific Gravity, Urine: 1.012 (ref 1.005–1.030)
pH: 5 (ref 5.0–8.0)

## 2023-12-09 LAB — GLUCOSE, CAPILLARY
Glucose-Capillary: 113 mg/dL — ABNORMAL HIGH (ref 70–99)
Glucose-Capillary: 109 mg/dL — ABNORMAL HIGH (ref 70–99)
Glucose-Capillary: 107 mg/dL — ABNORMAL HIGH (ref 70–99)
Glucose-Capillary: 128 mg/dL — ABNORMAL HIGH (ref 70–99)

## 2023-12-09 LAB — ECHOCARDIOGRAM COMPLETE
AV Mean grad: 4 mmHg
AV Peak grad: 8 mmHg
AV Vena cont: 0.2 cm
Ao pk vel: 1.41 m/s
Calc EF: 71.9 %
Height: 68 in
P 1/2 time: 376 ms
S' Lateral: 2.5 cm
Single Plane A2C EF: 72.7 %
Single Plane A4C EF: 72.7 %
Weight: 2035.29 [oz_av]

## 2023-12-09 LAB — SODIUM, URINE, RANDOM: Sodium, Ur: 79 mmol/L

## 2023-12-09 MED ORDER — STROKE: EARLY STAGES OF RECOVERY BOOK: Freq: Once | Status: DC

## 2023-12-09 MED ORDER — HEPARIN (PORCINE) 25000 UT/250ML-% IV SOLN
900.0000 [IU]/h | INTRAVENOUS | Status: AC
  Administered 2023-12-09: 1000 [IU]/h via INTRAVENOUS
  Filled 2023-12-09: qty 250

## 2023-12-09 MED ORDER — STROKE: EARLY STAGES OF RECOVERY BOOK
Freq: Once | Status: AC
  Filled 2023-12-09: qty 1

## 2023-12-09 NOTE — Hospital Course (Addendum)
 89 y.o. M with metastatic prostate CA, DM, HTN, CKD IIIb baseline 1.3-1.5 recent hip fracture last May, hypothyroidism, and prior amaurosis fugax who presented with acute on chronic weakness and falls.  In the ER, found to have AKI and stroke.

## 2023-12-09 NOTE — Progress Notes (Signed)
 PHARMACY - ANTICOAGULATION CONSULT NOTE  Pharmacy Consult for heparin  Indication: acute DVT  Allergies  Allergen Reactions   Beef-Derived Drug Products Other (See Comments)    Vegetarian   Chicken Allergy Other (See Comments)    Vegetarian   Fish Allergy Other (See Comments)    Vegetarian    Pork-Derived Products Other (See Comments)    Vegetarian     Patient Measurements: Height: 5' 8 (172.7 cm) Weight: 57.7 kg (127 lb 3.3 oz) IBW/kg (Calculated) : 68.4 HEPARIN  DW (KG): 60  Vital Signs: Temp: 98 F (36.7 C) (08/06 1104) Temp Source: Oral (08/06 0422) BP: 138/84 (08/06 1104) Pulse Rate: 104 (08/06 1104)  Labs: Recent Labs    12/08/23 1520 12/08/23 1557 12/09/23 0449  HGB 10.4* 11.6* 10.1*  HCT 32.5* 34.0* 31.6*  PLT 150  --  145*  LABPROT 17.9*  --   --   INR 1.4*  --   --   CREATININE 2.93* 2.90* 2.86*    Estimated Creatinine Clearance: 14.6 mL/min (A) (by C-G formula based on SCr of 2.86 mg/dL (H)).   Assessment:  89 y.o. male with medical history significant for prostate cancer with metastases who is admitted for multiple falls found to have a stroke. US  doppler confirmed acute deep vein thrombosis involving the left femoral vein, and left peroneal veins. Patient not on anticoagulation PTA. Pharmacy consulted to initiate IV heparin . No bolus due to recent stroke.   Initially started on DAPT per neurology, ok to stop with therapeutic anticoagulation being initiated. CBC wnl, platelets low stable 150 > 145.   Goal of Therapy:  Heparin  level 0.3-0.5 units/ml Monitor platelets by anticoagulation protocol: Yes   Plan:   Stop aspirin  and clopidogrel  Start heparin  infusion at 1000 units/hr, no bolus due to stroke Check heparin  level in 8 hours and daily while on heparin  Continue to monitor H&H and platelets  Thank you for allowing pharmacy to be a part of this patient's care.  Shelba Collier, PharmD, BCPS Clinical Pharmacist

## 2023-12-09 NOTE — Evaluation (Signed)
 Clinical/Bedside Swallow Evaluation Patient Details  Name: Marco [name] MRN: [id number] Date of Birth: March 11, 1934  Today's Date: 12/09/2023 Time: SLP Start Time (ACUTE ONLY): 9083 SLP Stop Time (ACUTE ONLY): 0932 SLP Time Calculation (min) (ACUTE ONLY): 16 min  Past Medical History:  Past Medical History:  Diagnosis Date   Amaurosis fugax 2017   Diabetes mellitus    DJD (degenerative joint disease) 08/25/2013   Elevated PSA    Other and unspecified hyperlipidemia 04/25/2013   Prostate cancer Marco [name])    Past Surgical History:  Past Surgical History:  Procedure Laterality Date   CATARACT EXTRACTION     Bilaterally   FRACTURE SURGERY Right    rod 2020   INTRAMEDULLARY (IM) NAIL INTERTROCHANTERIC Left 09/27/2023   Procedure: FIXATION, FRACTURE, INTERTROCHANTERIC, WITH INTRAMEDULLARY ROD;  Surgeon: Georgina Ozell LABOR, MD;  Location: [facility] OR;  Service: Orthopedics;  Laterality: Left;   TYMPANIC MEMBRANE REPAIR     R   HPI:  Marco [name] is a 89 y.o. 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    Assessment / Plan / Recommendation  Clinical Impression  Pt;s comprehends and expresses himself well in [place]. Upper dentures ill fitting and sister-in-law states he uses cream (has been ordered) but sometimes eats without them. Sh reported he has mostly been eating soups, rice and mostly liquids past several days. Oral cavity cleaned- lower dentures loose but remain in place. No significant oromotor impairment and  volitional cough is mildly weak. Consumed thin via cup and straw and had one immediate cough with straw use. Intake of puree/applesauce was unremarkable and solid not given. Recommend initiate full liquids and pt will be assessed with solids once denture cream present. Thin liquids via cup, so straw, crush meds and full supervision/assist. SLP Visit Diagnosis: Dysphagia, unspecified (R13.10)    Aspiration Risk  Mild aspiration risk    Diet Recommendation Thin liquid;Other (Comment) (full liquids)    Liquid Administration via: Cup;No straw Medication Administration: Crushed with puree Compensations: Slow rate;Small sips/bites Postural Changes: Seated upright at 90 degrees    Other  Recommendations Oral Care Recommendations: Oral care BID     Assistance Recommended at Discharge    Functional Status Assessment Patient has had a recent decline in their functional status and demonstrates the ability to make significant improvements in function in a reasonable and predictable amount of time.  Frequency and Duration min 2x/week  2 weeks       Prognosis Prognosis for improved oropharyngeal function: Good      Swallow Study   General Date of Onset: 12/09/23 HPI: Marco [name] is a 89 y.o. 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 Type of Study: Bedside Swallow Evaluation Previous Swallow Assessment:  (none)  Diet Prior to this Study: NPO Temperature Spikes Noted: No Respiratory Status: Room air History of Recent  Intubation: No Behavior/Cognition: Alert;Cooperative;Pleasant mood;Requires cueing Oral Cavity Assessment: Dry Oral Care Completed by SLP: Yes Oral Cavity - Dentition: Dentures, top;Dentures, bottom (top do not fit- ordering denture cream) Vision:  (seems impaired?) Self-Feeding Abilities: Needs assist Patient Positioning: Upright in bed Baseline Vocal Quality: Normal Volitional Cough: Weak    Oral/Motor/Sensory Function Overall Oral Motor/Sensory Function: Within functional limits   Ice Chips Ice chips: Not tested   Thin Liquid Thin Liquid: Impaired Presentation: Straw;Cup Oral Phase Impairments: Reduced labial seal Oral Phase Functional Implications: Left anterior spillage;Right anterior spillage Pharyngeal  Phase Impairments: Cough - Immediate (with straw)    Nectar Thick Nectar Thick Liquid: Not tested   Honey Thick Honey Thick Liquid: Not tested   Puree Puree: Within functional limits   Solid     Solid: Not tested      Marco [name] 12/09/2023,9:55 AM

## 2023-12-09 NOTE — NC FL2 (Signed)
 [place]  MEDICAID FL2 LEVEL OF CARE FORM     IDENTIFICATION  Patient Name: Marco [name] Birthdate: [birth year] Sex: male Admission Date (Current Location): 12/08/2023  [facility] and [place] Number:  Producer, television/film/video and Address:  [facility], [address], [place], [place] [address]      Provider Number: [id number]  Attending Physician Name and Address:  Jonel Lonni SQUIBB, *  Relative Name and Phone Number:  Oather, [place] [phone]  [phone]  Hausler,Binta Relative   [phone]    Current Level of Care: Hospital Recommended Level of Care: Skilled Nursing Facility Prior Approval Number:    Date Approved/Denied:   PASRR Number: [id number]  Discharge Plan: SNF    Current Diagnoses: Patient Active Problem List   Diagnosis Date Noted   Stroke [facility]) 12/08/2023   Iron  deficiency anemia 10/30/2023   Genetic testing 10/06/2023   Intertrochanteric fracture of left femur (HCC) 09/27/2023   History of CAD (coronary artery disease) 09/27/2023   Callus 03/05/2023   Hallux limitus of left foot 03/05/2023   Fall at home, initial encounter 02/11/2022   Prostate cancer metastatic to bone (HCC) 05/29/2021   CKD (chronic kidney disease), stage III (HCC) 02/11/2021   CAD (coronary artery disease) 03/06/2020   Hypothyroidism 12/02/2017   Chronic pain of right knee 10/13/2017   Chest pain 11/19/2016   Amaurosis fugax 12/12/2015   PCP NOTES >>> 03/08/2015   Osteoporosis 09/15/2014   Primary osteoarthritis of both knees 03/16/2014   Hearing difficulty 10/20/2013   DJD (degenerative joint disease) 08/25/2013   Neck mass 04/25/2013   Hyperlipidemia 04/25/2013   Erectile dysfunction 12/26/2011   Annual physical exam 08/22/2011   Prostate nodule 08/22/2011   Non-insulin  dependent type 2 diabetes mellitus (HCC) 05/07/2011    Orientation RESPIRATION BLADDER Height & Weight     Self, Time  Normal Incontinent Weight: 57.7 kg Height:  5' 8  (172.7 cm)  BEHAVIORAL SYMPTOMS/MOOD NEUROLOGICAL BOWEL NUTRITION STATUS      Continent Diet (see d/c summary)  AMBULATORY STATUS COMMUNICATION OF NEEDS Skin   Extensive Assist Verbally Normal                       Personal Care Assistance Level of Assistance  Bathing, Feeding, Dressing Bathing Assistance: Maximum assistance Feeding assistance: Limited assistance Dressing Assistance: Maximum assistance     Functional Limitations Info  Sight, Hearing, Speech Sight Info: Impaired Hearing Info: Adequate Speech Info: Adequate    SPECIAL CARE FACTORS FREQUENCY  PT (By licensed PT), OT (By licensed OT)     PT Frequency: 5x/wk OT Frequency: 5x/wk            Contractures Contractures Info: Not present    Additional Factors Info  Code Status, Allergies, Insulin  Sliding Scale Code Status Info: Full Allergies Info: Beef-derived drug products/ chicken allergy/ fish allergy/ pork-derived products   Insulin  Sliding Scale Info: Novolog  0-9 units SQ TID       Current Medications (12/09/2023):  This is the current hospital active medication list Current Facility-Administered Medications  Medication Dose Route Frequency Provider Last Rate Last Admin   [START ON 12/10/2023]  stroke: early stages of recovery book   Does not apply Once Danford, Lonni SQUIBB, MD       0.9 %  sodium chloride  infusion   Intravenous Continuous Jonel Lonni SQUIBB, MD 150 mL/hr at 12/09/23 1024 Rate Change at 12/09/23 1024   acetaminophen  (TYLENOL ) tablet 650 mg  650 mg  Oral Q6H PRN Rashid, Farhan, MD       Or   acetaminophen  (TYLENOL ) suppository 650 mg  650 mg Rectal Q6H PRN Rashid, Farhan, MD       albuterol  (PROVENTIL ) (2.5 MG/3ML) 0.083% nebulizer solution 2.5 mg  2.5 mg Nebulization Q2H PRN Rashid, Farhan, MD       aspirin  EC tablet 81 mg  81 mg Oral Daily Rashid, Farhan, MD   81 mg at 12/09/23 1023   atorvastatin  (LIPITOR) tablet 40 mg  40 mg Oral Daily Rashid, Farhan, MD   40 mg at 12/09/23  1023   bisacodyl  (DULCOLAX) EC tablet 5 mg  5 mg Oral Daily PRN Rashid, Farhan, MD       clopidogrel  (PLAVIX ) tablet 75 mg  75 mg Oral Daily Rashid, Farhan, MD   75 mg at 12/09/23 1023   heparin  injection 5,000 Units  5,000 Units Subcutaneous Q8H Rashid, Farhan, MD   5,000 Units at 12/09/23 9393   insulin  aspart (novoLOG ) injection 0-9 Units  0-9 Units Subcutaneous TID WC Rashid, Farhan, MD       levothyroxine  (SYNTHROID ) tablet 100 mcg  100 mcg Oral Q0600 Rashid, Farhan, MD   100 mcg at 12/09/23 9393   methocarbamol  (ROBAXIN ) injection 500 mg  500 mg Intravenous Q6H PRN Rashid, Farhan, MD       ondansetron  (ZOFRAN ) tablet 4 mg  4 mg Oral Q6H PRN Rashid, Farhan, MD       Or   ondansetron  (ZOFRAN ) injection 4 mg  4 mg Intravenous Q6H PRN Rashid, Farhan, MD       oxyCODONE  (Oxy IR/ROXICODONE ) immediate release tablet 5 mg  5 mg Oral Q4H PRN Rashid, Farhan, MD       senna-docusate (Senokot-S) tablet 1 tablet  1 tablet Oral QHS PRN Rashid, Farhan, MD       sodium chloride  0.9 % bolus 500 mL  500 mL Intravenous Once Barrett, Jamie N, PA-C       sodium chloride  flush (NS) 0.9 % injection 3 mL  3 mL Intravenous Q12H Rashid, Farhan, MD   3 mL at 12/09/23 1023     Discharge Medications: Please see discharge summary for a list of discharge medications.  Relevant Imaging Results:  Relevant Lab Results:   Additional Information SSN: [id number]  Andrez JULIANNA George, RN

## 2023-12-09 NOTE — Progress Notes (Signed)
 Lower ext venous duplex  has been completed. Refer to [facility] under chart review to view preliminary results. Results given to Dr. Jerri.   12/09/2023  1:08 PM Rebbie Lauricella D

## 2023-12-09 NOTE — Progress Notes (Signed)
 STROKE TEAM PROGRESS NOTE   SUBJECTIVE (INTERVAL HISTORY) His daughters are at the bedside.  Patient lying in bed, lethargic and drowsy sleepy, not quite cooperative with exam.  LE venous Doppler showed left DVT, with current embolic stroke, concerning for hypercoag state from advanced malignancy.  Put on heparin  IV.   OBJECTIVE Temp:  [97.6 F (36.4 C)-98.1 F (36.7 C)] 98 F (36.7 C) (08/06 1104) Pulse Rate:  [96-104] 104 (08/06 1104) Cardiac Rhythm: Normal sinus rhythm (08/06 0700) Resp:  [16-18] 18 (08/06 1104) BP: (117-145)/(66-84) 138/84 (08/06 1104) SpO2:  [97 %-100 %] 100 % (08/06 1104) Weight:  [57.7 kg] 57.7 kg (08/05 2046)  Recent Labs  Lab 12/09/23 0630 12/09/23 1131 12/09/23 1609  GLUCAP 113* 109* 107*   Recent Labs  Lab 12/08/23 1520 12/08/23 1557 12/09/23 0449  NA 138 137 139  K 5.3* 5.3* 5.1  CL 108 111 110  CO2 17*  --  16*  GLUCOSE 125* 121* 106*  BUN 35* 33* 36*  CREATININE 2.93* 2.90* 2.86*  CALCIUM  8.5*  --  8.3*   Recent Labs  Lab 12/08/23 1520  AST 33  ALT 20  ALKPHOS 137*  BILITOT 1.2  PROT 6.7  ALBUMIN  3.2*   Recent Labs  Lab 12/08/23 1520 12/08/23 1557 12/09/23 0449  WBC 6.5  --  5.7  NEUTROABS 5.3  --   --   HGB 10.4* 11.6* 10.1*  HCT 32.5* 34.0* 31.6*  MCV 89.3  --  89.0  PLT 150  --  145*   No results for input(s): CKTOTAL, CKMB, CKMBINDEX, TROPONINI in the last 168 hours. Recent Labs    12/08/23 1520  LABPROT 17.9*  INR 1.4*   Recent Labs    12/09/23 0421  COLORURINE YELLOW  LABSPEC 1.012  PHURINE 5.0  GLUCOSEU NEGATIVE  HGBUR SMALL*  BILIRUBINUR NEGATIVE  KETONESUR 5*  PROTEINUR 100*  NITRITE NEGATIVE  LEUKOCYTESUR NEGATIVE       Component Value Date/Time   CHOL 138 12/08/2023 2140   TRIG 78 12/08/2023 2140   HDL 64 12/08/2023 2140   CHOLHDL 2.2 12/08/2023 2140   VLDL 16 12/08/2023 2140   LDLCALC 58 12/08/2023 2140   Lab Results  Component Value Date   HGBA1C 7.4 (H) 07/10/2023   No  results found for: LABOPIA, COCAINSCRNUR, LABBENZ, AMPHETMU, THCU, LABBARB  No results for input(s): ETH in the last 168 hours.  I have personally reviewed the radiological images below and agree with the radiology interpretations.  VAS US  LOWER EXTREMITY VENOUS (DVT) Result Date: 12/09/2023  Lower Venous DVT Study Patient Name:  Marco [name]  Date of Exam:   12/09/2023 Medical Rec #: [id number]          Accession #:    [id number] Date of Birth: Jan 10, 1934           Patient Gender: M Patient Age:   89 years Exam Location:  [facility] Procedure:      VAS US  LOWER EXTREMITY VENOUS (DVT) Referring Phys: ARY Tavi Gaughran --------------------------------------------------------------------------------  Indications: Embolic stroke.  Risk Factors: Prostate cancer, bony metastases. Comparison Study: 02-11-21 - negative Performing Technologist: Ricka Sturdivant-Jones RDMS, RVT  Examination Guidelines: A complete evaluation includes B-mode imaging, spectral Doppler, color Doppler, and power Doppler as needed of all accessible portions of each vessel. Bilateral testing is considered an integral part of a complete examination. Limited examinations for reoccurring indications may be performed as noted. The reflux portion of the exam is performed with the patient in  reverse Trendelenburg.  +---------+---------------+---------+-----------+----------+--------------+ RIGHT    CompressibilityPhasicitySpontaneityPropertiesThrombus Aging +---------+---------------+---------+-----------+----------+--------------+ CFV      Full           Yes      Yes                                 +---------+---------------+---------+-----------+----------+--------------+ SFJ      Full                                                        +---------+---------------+---------+-----------+----------+--------------+ FV Prox  Full                                                         +---------+---------------+---------+-----------+----------+--------------+ FV Mid   Full                                                        +---------+---------------+---------+-----------+----------+--------------+ FV DistalFull                                                        +---------+---------------+---------+-----------+----------+--------------+ PFV      Full                                                        +---------+---------------+---------+-----------+----------+--------------+ POP      Full           Yes      Yes                                 +---------+---------------+---------+-----------+----------+--------------+ PTV      Full                                                        +---------+---------------+---------+-----------+----------+--------------+ PERO     Full                                                        +---------+---------------+---------+-----------+----------+--------------+   +---------+---------------+---------+-----------+----------+--------------+ LEFT     CompressibilityPhasicitySpontaneityPropertiesThrombus Aging +---------+---------------+---------+-----------+----------+--------------+ CFV      Full           Yes      Yes                                 +---------+---------------+---------+-----------+----------+--------------+  SFJ      Full                                                        +---------+---------------+---------+-----------+----------+--------------+ FV Prox  Full                                                        +---------+---------------+---------+-----------+----------+--------------+ FV Mid   Partial        Yes      Yes                  Acute          +---------+---------------+---------+-----------+----------+--------------+ FV DistalPartial        No       No                   Acute           +---------+---------------+---------+-----------+----------+--------------+ PFV      Full                                                        +---------+---------------+---------+-----------+----------+--------------+ POP      Full                                                        +---------+---------------+---------+-----------+----------+--------------+ PTV      Full                                                        +---------+---------------+---------+-----------+----------+--------------+ PERO     None                                         Acute          +---------+---------------+---------+-----------+----------+--------------+     Summary: RIGHT: - There is no evidence of deep vein thrombosis in the lower extremity.  - No cystic structure found in the popliteal fossa.  LEFT: - Findings consistent with acute deep vein thrombosis involving the left femoral vein, and left peroneal veins.  - No cystic structure found in the popliteal fossa.  *See table(s) above for measurements and observations. Electronically signed by Gaile New MD on 12/09/2023 at 6:11:30 PM.    Final    ECHOCARDIOGRAM COMPLETE Result Date: 12/09/2023    ECHOCARDIOGRAM REPORT   Patient Name:   Marco [name] Date of Exam: 12/09/2023 Medical Rec #:  [id number]         Height:       68.0 in Accession #:    [id number]  Weight:       127.2 lb Date of Birth:  03/19/1934          BSA:          1.686 m Patient Age:    89 years          BP:           124/67 mmHg Patient Gender: M                 HR:           99 bpm. Exam Location:  Inpatient Procedure: 2D Echo, Cardiac Doppler and Color Doppler (Both Spectral and Color            Flow Doppler were utilized during procedure). Indications:    Stroke  History:        Patient has prior history of Echocardiogram examinations, most                 recent 02/12/2022. CAD, CKD; Risk Factors:Dyslipidemia.  Sonographer:    Therisa Crouch Referring Phys: [id number]  Jasmarie Coppock IMPRESSIONS  1. Left ventricular ejection fraction, by estimation, is 65 to 70%. The left ventricle has normal function. The left ventricle has no regional wall motion abnormalities. There is moderate asymmetric left ventricular hypertrophy of the basal-septal segment. Left ventricular diastolic parameters are indeterminate.  2. Right ventricular systolic function is normal. The right ventricular size is normal. There is mildly elevated pulmonary artery systolic pressure. The estimated right ventricular systolic pressure is 36.3 mmHg.  3. Left atrial size was mildly dilated.  4. The mitral valve is grossly normal. Trivial mitral valve regurgitation. No evidence of mitral stenosis.  5. The aortic valve is grossly normal. Aortic valve regurgitation is mild. No aortic stenosis is present.  6. The inferior vena cava is dilated in size with <50% respiratory variability, suggesting right atrial pressure of 15 mmHg. Conclusion(s)/Recommendation(s): No intracardiac source of embolism detected on this transthoracic study. Consider a transesophageal echocardiogram to exclude cardiac source of embolism if clinically indicated. FINDINGS  Left Ventricle: Left ventricular ejection fraction, by estimation, is 65 to 70%. The left ventricle has normal function. The left ventricle has no regional wall motion abnormalities. The left ventricular internal cavity size was normal in size. There is  moderate asymmetric left ventricular hypertrophy of the basal-septal segment. Left ventricular diastolic parameters are indeterminate. Right Ventricle: The right ventricular size is normal. No increase in right ventricular wall thickness. Right ventricular systolic function is normal. There is mildly elevated pulmonary artery systolic pressure. The tricuspid regurgitant velocity is 2.31  m/s, and with an assumed right atrial pressure of 15 mmHg, the estimated right ventricular systolic pressure is 36.3 mmHg. Left Atrium: Left atrial  size was mildly dilated. Right Atrium: Right atrial size was normal in size. Pericardium: There is no evidence of pericardial effusion. Mitral Valve: The mitral valve is grossly normal. Trivial mitral valve regurgitation. No evidence of mitral valve stenosis. Tricuspid Valve: The tricuspid valve is normal in structure. Tricuspid valve regurgitation is trivial. No evidence of tricuspid stenosis. Aortic Valve: The aortic valve is grossly normal. Aortic valve regurgitation is mild. Aortic regurgitation PHT measures 376 msec. No aortic stenosis is present. Aortic valve mean gradient measures 4.0 mmHg. Aortic valve peak gradient measures 8.0 mmHg. Pulmonic Valve: The pulmonic valve was normal in structure. Pulmonic valve regurgitation is trivial. No evidence of pulmonic stenosis. Aorta: The aortic root is normal in size and structure. Venous: The inferior vena cava is dilated  in size with less than 50% respiratory variability, suggesting right atrial pressure of 15 mmHg. IAS/Shunts: No atrial level shunt detected by color flow Doppler.  LEFT VENTRICLE PLAX 2D LVIDd:         3.40 cm LVIDs:         2.50 cm LV PW:         0.80 cm LV IVS:        1.40 cm  LV Volumes (MOD) LV vol d, MOD A2C: 35.0 ml LV vol d, MOD A4C: 46.5 ml LV vol s, MOD A2C: 9.6 ml LV vol s, MOD A4C: 12.7 ml LV SV MOD A2C:     25.5 ml LV SV MOD A4C:     46.5 ml LV SV MOD BP:      29.3 ml RIGHT VENTRICLE          IVC RV Basal diam:  2.70 cm  IVC diam: 2.40 cm TAPSE (M-mode): 2.9 cm LEFT ATRIUM             Index LA diam:        2.40 cm 1.42 cm/m LA Vol (A2C):   29.3 ml 17.38 ml/m LA Vol (A4C):   47.2 ml 27.99 ml/m LA Biplane Vol: 37.1 ml 22.00 ml/m  AORTIC VALVE AV Vmax:           141.00 cm/s AV Vmean:          94.100 cm/s AV VTI:            0.227 m AV Peak Grad:      8.0 mmHg AV Mean Grad:      4.0 mmHg LVOT Vmax:         85.40 cm/s LVOT Vmean:        57.500 cm/s LVOT VTI:          0.134 m LVOT/AV VTI ratio: 0.59 AI PHT:            376 msec AR Vena  Contracta: 0.20 cm  AORTA Ao Root diam: 3.40 cm Ao Asc diam:  3.50 cm TRICUSPID VALVE TR Peak grad:   21.3 mmHg TR Vmax:        231.00 cm/s  SHUNTS Systemic VTI: 0.13 m Soyla Merck MD Electronically signed by Soyla Merck MD Signature Date/Time: 12/09/2023/1:44:24 PM    Final    MR Brain W and Wo Contrast Addendum Date: 12/08/2023 ADDENDUM REPORT: 12/08/2023 21:34 ADDENDUM: Findings discussed with the on call provider at 9:17 p.m. Electronically Signed   By: Gilmore GORMAN Molt M.D.   On: 12/08/2023 21:34   Result Date: 12/08/2023 CLINICAL DATA:  Stroke, follow up Stroke, also hx of cancer with met; Stroke/TIA, determine embolic source EXAM: MRI HEAD WITHOUT AND WITH CONTRAST MRA HEAD WITHOUT CONTRAST MRA NECK WITHOUT AND WITH CONTRAST TECHNIQUE: Multiplanar, multi-echo pulse sequences of the brain and surrounding structures were acquired without and with intravenous contrast. Angiographic images of the Circle of Willis were acquired using MRA technique without intravenous contrast. Angiographic images of the neck were acquired using MRA technique without and with intravenous contrast. Carotid stenosis measurements (when applicable) are obtained utilizing NASCET criteria, using the distal internal carotid diameter as the denominator. CONTRAST:  5mL GADAVIST  GADOBUTROL  1 MMOL/ML IV SOLN COMPARISON:  CT head from earlier today. FINDINGS: MRI HEAD FINDINGS Brain: Acute left PCA territory infarct involving the left occipital lobe, hippocampus and thalamus. Additional punctate acute infarct in the right cerebellum. Edema without substantial mass effect. No midline shift. Small amount of  petechial hemorrhage associated with the left occipital infarct. No evidence of mass occupying acute hemorrhage, mass lesion or hydrocephalus. No pathologic enhancement. Vascular: See below. Skull and upper cervical spine: Normal marrow signal. Sinuses/Orbits: Clear sinuses.  No acute orbital findings. MRA HEAD FINDINGS Anterior  circulation: Bilateral intracranial ICAs, MCAs, and ACAs are patent without proximal hemodynamically significant stenosis. Posterior circulation: Bilateral intradural vertebral arteries, basilar artery and right PCA are patent without proximal him at least significant stenosis. Left proximal to mid P2 PCA occlusion. MRA NECK FINDINGS Aortic arch: Great vessel origins are patent. Motion limited assessment. Right carotid system: On motion limited evaluation the common carotid internal carotid arteries appear patent without significant stenosis. Left carotid system: On motion limited evaluation the common carotid internal carotid arteries appear patent without significant stenosis. Vertebral arteries: Limited evaluation due to motion and contrast timing. The vertebral arteries appear patent bilaterally without visible high-grade stenosis. IMPRESSION: 1. 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 2. Left proximal to mid P2 PCA occlusion. Electronically Signed: By: Gilmore GORMAN Molt M.D. On: 12/08/2023 20:45   MR ANGIO HEAD WO CONTRAST Addendum Date: 12/08/2023 ADDENDUM REPORT: 12/08/2023 21:34 ADDENDUM: Findings discussed with the on call provider at 9:17 p.m. Electronically Signed   By: Gilmore GORMAN Molt M.D.   On: 12/08/2023 21:34   Result Date: 12/08/2023 CLINICAL DATA:  Stroke, follow up Stroke, also hx of cancer with met; Stroke/TIA, determine embolic source EXAM: MRI HEAD WITHOUT AND WITH CONTRAST MRA HEAD WITHOUT CONTRAST MRA NECK WITHOUT AND WITH CONTRAST TECHNIQUE: Multiplanar, multi-echo pulse sequences of the brain and surrounding structures were acquired without and with intravenous contrast. Angiographic images of the Circle of Willis were acquired using MRA technique without intravenous contrast. Angiographic images of the neck were acquired using MRA technique without and with intravenous contrast. Carotid stenosis measurements (when applicable) are obtained utilizing  NASCET criteria, using the distal internal carotid diameter as the denominator. CONTRAST:  5mL GADAVIST  GADOBUTROL  1 MMOL/ML IV SOLN COMPARISON:  CT head from earlier today. FINDINGS: MRI HEAD FINDINGS Brain: Acute left PCA territory infarct involving the left occipital lobe, hippocampus and thalamus. Additional punctate acute infarct in the right cerebellum. Edema without substantial mass effect. No midline shift. Small amount of petechial hemorrhage associated with the left occipital infarct. No evidence of mass occupying acute hemorrhage, mass lesion or hydrocephalus. No pathologic enhancement. Vascular: See below. Skull and upper cervical spine: Normal marrow signal. Sinuses/Orbits: Clear sinuses.  No acute orbital findings. MRA HEAD FINDINGS Anterior circulation: Bilateral intracranial ICAs, MCAs, and ACAs are patent without proximal hemodynamically significant stenosis. Posterior circulation: Bilateral intradural vertebral arteries, basilar artery and right PCA are patent without proximal him at least significant stenosis. Left proximal to mid P2 PCA occlusion. MRA NECK FINDINGS Aortic arch: Great vessel origins are patent. Motion limited assessment. Right carotid system: On motion limited evaluation the common carotid internal carotid arteries appear patent without significant stenosis. Left carotid system: On motion limited evaluation the common carotid internal carotid arteries appear patent without significant stenosis. Vertebral arteries: Limited evaluation due to motion and contrast timing. The vertebral arteries appear patent bilaterally without visible high-grade stenosis. IMPRESSION: 1. 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 2. Left proximal to mid P2 PCA occlusion. Electronically Signed: By: Gilmore GORMAN Molt M.D. On: 12/08/2023 20:45   MR Angiogram Neck W or Wo Contrast Result Date: 12/08/2023 CLINICAL DATA:  Provided history: Stroke/TIA, determine embolic source.  EXAM: MRA NECK WITHOUT AND WITH CONTRAST TECHNIQUE:  Multiplanar and multiecho pulse sequences of the neck were obtained without and with intravenous contrast. Angiographic images of the neck were obtained using MRA technique without and with intravenous contrast. CONTRAST:  5mL GADAVIST  GADOBUTROL  1 MMOL/ML IV SOLN COMPARISON:  Same-day brain MRI 12/08/2023. Same day MRA head 12/08/2023. FINDINGS: Aortic arch: Standard aortic branching. No hemodynamically significant innominate or proximal subclavian artery stenosis. Right carotid system: CCA and ICA patent within the neck without stenosis. Tortuosity of the cervical ICA. Left carotid system: CCA and ICA patent within the neck without stenosis. Tortuosity of the cervical ICA. Vertebral arteries: Patent within the neck without stenosis. The left vertebral artery is dominant. IMPRESSION: 1. The common carotid, internal carotid and vertebral arteries are patent within the neck without stenosis. 2. MRA head findings separately reported. Electronically Signed   By: Rockey Childs D.O.   On: 12/08/2023 20:42   CT Lumbar Spine Wo Contrast Result Date: 12/08/2023 CLINICAL DATA:  Fall with back pain EXAM: CT LUMBAR SPINE WITHOUT CONTRAST TECHNIQUE: Multidetector CT imaging of the lumbar spine was performed without intravenous contrast administration. Multiplanar CT image reconstructions were also generated. RADIATION DOSE REDUCTION: This exam was performed according to the departmental dose-optimization program which includes automated exposure control, adjustment of the mA and/or kV according to patient size and/or use of iterative reconstruction technique. COMPARISON:  Radiographs 12/08/2023, 11/18/2023, chest CT 02/12/2022 FINDINGS: Segmentation: Based on chest CT from October 2023, hypoplastic ribs are present at T12. Transitional anatomy, for the purposes of reporting, transitional vertebra will be designated sacralized L5 and the first non rib-bearing lumbar type  vertebra will be designated L1, careful attention to lumbar numbering recommended if there is any planned intervention. Alignment: Normal. Vertebrae: Multiple sclerotic and lucent lesions throughout the spine corresponding to history of widespread skeletal metastatic disease. Comminuted fracture involving the L1 vertebral body, involves the anterior and posterior vertebral body, including the superior endplate. Fracture through the posterior cortex of the vertebral body. The fracture also involves the bilateral pedicles. Fracture is present on radiographs from July 16, new finding since June 2024. Diffuse height loss of the vertebral body compared to prior CT images, estimated at 25%. Minimal 2 mm retropulsion of right posterior vertebral fracture fragment, no significant canal stenosis. No other discrete fractures are visualized. Paraspinal and other soft tissues: Paravertebral soft tissue swelling at L1. Disc levels: At L1-L2, disc space narrowing. Central, left foraminal and extraforaminal disc bulging. No high-grade canal stenosis. Facet degenerative changes. At L2-L3, disc space narrowing. Posterior disc osteophyte complex with diffuse disc bulge. Ligamentum flavum thickening and hypertrophic facet degenerative changes. Moderate severe canal stenosis. At L3-L4, disc space narrowing. Disc bulge, ligamentum flavum thickening and hypertrophic facet degeneration results in moderate severe canal stenosis. At L4-L5, disc space narrowing. Posterior disc osteophyte complex, ligamentum flavum thickening and hypertrophic facet degenerative changes results in severe canal stenosis. There is moderate bilateral foraminal narrowing. Rudimentary disc at transitional L5 and S1 segment. No canal stenosis. IMPRESSION: 1. Suspected transition anatomy as discussed above. 2. Comminuted likely pathologic fracture involving the L1 vertebral body, involving the anterior and posterior vertebral body, including the superior endplate.  The fracture also involves the bilateral pedicles. Diffuse height loss of the vertebral body compared to prior CT images, estimated at 25%. Minimal 2 mm retropulsion of right posterior vertebral fracture fragment, no significant canal stenosis. Fracture is likely subacute as it was present on radiograph from July. 3. Multiple sclerotic and lucent lesions throughout the spine corresponding to history of widespread skeletal  metastatic disease. 4. Multilevel degenerative changes with multilevel moderate severe canal stenosis due to combination of disc disease, ligamentum flavum thickening and hypertrophic facet degeneration Electronically Signed   By: Luke Bun M.D.   On: 12/08/2023 20:34   US  RENAL Result Date: 12/08/2023 CLINICAL DATA:  Acute kidney injury EXAM: RENAL / URINARY TRACT ULTRASOUND COMPLETE COMPARISON:  CT 10/14/2022 FINDINGS: Right Kidney: Renal measurements: 5.7 x 2.4 x 2.8 cm = volume: 20.7 mL. Cortex is echogenic. No hydronephrosis. Upper pole cyst measuring 26 mm, no imaging follow-up is recommended. Left Kidney: Renal measurements: 10.6 x 5.2 x 4.1 cm = volume: 115.6 mL. Cortex appears slightly echogenic. No hydronephrosis. Interpolar cyst measuring 35 mm, no imaging follow-up is recommended. Bladder: Appears normal for degree of bladder distention. Other: None. IMPRESSION: 1. Echogenic kidneys bilaterally consistent with medical renal disease, no hydronephrosis. There is atrophy of the right kidney Electronically Signed   By: Luke Bun M.D.   On: 12/08/2023 20:10   CT PELVIS WO CONTRAST Result Date: 12/08/2023 CLINICAL DATA:  Hip trauma EXAM: CT PELVIS WITHOUT CONTRAST TECHNIQUE: Multidetector CT imaging of the pelvis was performed following the standard protocol without intravenous contrast. RADIATION DOSE REDUCTION: This exam was performed according to the departmental dose-optimization program which includes automated exposure control, adjustment of the mA and/or kV according to  patient size and/or use of iterative reconstruction technique. COMPARISON:  12/08/2023, 11/26/2023 PET-CT, hip radiograph 09/27/2023, CT 10/14/2022 FINDINGS: Urinary Tract:  No abnormality visualized. Bowel: Moderate retained feces at the rectum. No acute bowel wall thickening. Vascular/Lymphatic: Aortic atherosclerosis. Tortuous ectatic common iliac arteries. No suspicious lymph nodes Reproductive:  Enlarged prostate Other:  Negative for pelvic effusion or free air. Musculoskeletal: Diffuse heterogeneous mineralization of the pelvis and femurs with faintly visible sclerosis and areas of subtle lucency, corresponding to history of widespread skeletal metastatic disease on recent PET CT. Status post intramedullary rodding of the right femur with intact hardware and no fracture. Suspicion of lucent femoral bone lesion with cortical erosion at the posterior lower trochanter, series 4, image 109. Status post intramedullary rodding of left femur across comminuted intertrochanteric fracture. There is mild sclerosis and periosteal new bone formation at the fracture margins consistent with more remote injury. No definite acute displaced fracture is seen. Markedly displaced lesser trochanteric fracture fragment. IMPRESSION: 1. Status post intramedullary rodding of left femur across comminuted intertrochanteric fracture. There is mild sclerosis and periosteal new bone formation at the fracture margins consistent with more remote injury. No definite acute displaced fracture is seen. 2. Status post intramedullary rodding of the right femur with intact hardware and no fracture. Suspicion of lucent femoral bone lesion with cortical erosion at the posterior lower trochanter. 3. Diffuse heterogeneous mineralization of the pelvis and femurs with faintly visible sclerosis and areas of subtle lucency, corresponding to history of widespread skeletal metastatic disease on recent PET CT. 4. Aortic atherosclerosis. Aortic Atherosclerosis  (ICD10-I70.0). Electronically Signed   By: Luke Bun M.D.   On: 12/08/2023 20:06   DG Chest 1 View Result Date: 12/08/2023 CLINICAL DATA:  [id number] Sepsis [facility]) [id number] EXAM: CHEST  1 VIEW COMPARISON:  Chest x-ray 09/26/2023 FINDINGS: The heart and mediastinal contours are within normal limits. Atherosclerotic plaque. Low lung volumes. No focal consolidation. No pulmonary edema. No pleural effusion. No pneumothorax. No acute osseous abnormality. IMPRESSION: 1. No active disease. 2.  Aortic Atherosclerosis (ICD10-I70.0). Electronically Signed   By: Morgane  Naveau M.D.   On: 12/08/2023 14:35   DG Lumbar Spine Complete Result  Date: 12/08/2023 CLINICAL DATA:  fall EXAM: LUMBAR SPINE - COMPLETE 4+ VIEW COMPARISON:  CT abdomen pelvis 10/14/2022 FINDINGS: Limited evaluation due to overlapping osseous structures and overlying soft tissues. Age-indeterminate L2 vertebral body height loss. Multilevel moderate severe degenerative change of the spine. Multilevel osseous neural foraminal stenosis. Alignment is normal. Multilevel intervertebral disc space narrowing. Vascular calcification. IMPRESSION: Age-indeterminate L2 vertebral body height loss. Limited evaluation due to overlapping osseous structures and overlying soft tissues. Recommend cross-sectional imaging for further evaluation. Electronically Signed   By: Morgane  Naveau M.D.   On: 12/08/2023 14:34   DG HIP UNILAT WITH PELVIS 2-3 VIEWS LEFT Result Date: 12/08/2023 CLINICAL DATA:  [id number] Fall [id number] EXAM: DG HIP (WITH OR WITHOUT PELVIS) 2-3V LEFT; DG HIP (WITH OR WITHOUT PELVIS) 2-3V RIGHT COMPARISON:  PET CT 11/26/2023, x-ray left femur 09/27/2023 FINDINGS: There is no evidence of hip fracture or dislocation of the right hip. Redemonstration of partially visualized interlocking intramedullary nail fixation of the right femur. Redemonstration of subacute to chronic healing left femoral inter trochanteric fracture status post interlocking intramedullary nail  fixation of the femur. Some periosteal reaction noted along the left intertrochanteric fracture with superimposed acute fracture not excluded. No acute displaced fracture or diastasis of the bones of the pelvis. There is no evidence of arthropathy or other focal bone abnormality. IMPRESSION: 1. Bilateral interlocking intramedullary nail fixation with healing subacute to chronic left femoral intertrochanteric fracture. Some periosteal reaction noted along the left intertrochanteric fracture with superimposed periprosthetic acute inter trochanteric fracture not excluded. 2. Negative for acute traumatic injury of the right hip or bones of the pelvis. Electronically Signed   By: Morgane  Naveau M.D.   On: 12/08/2023 14:32   DG HIP UNILAT WITH PELVIS 2-3 VIEWS RIGHT Result Date: 12/08/2023 CLINICAL DATA:  [id number] Fall [id number] EXAM: DG HIP (WITH OR WITHOUT PELVIS) 2-3V LEFT; DG HIP (WITH OR WITHOUT PELVIS) 2-3V RIGHT COMPARISON:  PET CT 11/26/2023, x-ray left femur 09/27/2023 FINDINGS: There is no evidence of hip fracture or dislocation of the right hip. Redemonstration of partially visualized interlocking intramedullary nail fixation of the right femur. Redemonstration of subacute to chronic healing left femoral inter trochanteric fracture status post interlocking intramedullary nail fixation of the femur. Some periosteal reaction noted along the left intertrochanteric fracture with superimposed acute fracture not excluded. No acute displaced fracture or diastasis of the bones of the pelvis. There is no evidence of arthropathy or other focal bone abnormality. IMPRESSION: 1. Bilateral interlocking intramedullary nail fixation with healing subacute to chronic left femoral intertrochanteric fracture. Some periosteal reaction noted along the left intertrochanteric fracture with superimposed periprosthetic acute inter trochanteric fracture not excluded. 2. Negative for acute traumatic injury of the right hip or bones of the  pelvis. Electronically Signed   By: Morgane  Naveau M.D.   On: 12/08/2023 14:32   CT Head Wo Contrast Result Date: 12/08/2023 EXAM: CT HEAD WITHOUT CONTRAST 12/08/2023 01:03:19 PM TECHNIQUE: CT of the head was performed without the administration of intravenous contrast. Automated exposure control, iterative reconstruction, and/or weight based adjustment of the mA/kV was utilized to reduce the radiation dose to as low as reasonably achievable. COMPARISON: 02/11/2022 CLINICAL HISTORY: Mental status change, unknown cause. AMS. FINDINGS: BRAIN AND VENTRICLES: Since the previous study, the patient has developed a late subacute infarct within the left occipital lobe. There are also new subacute lacunar infarcts within the left thalamus. There is mild-to-moderate periventricular white matter disease. No acute hemorrhage. Gray-white differentiation is preserved. No hydrocephalus. No extra-axial collection.  No mass effect or midline shift. ORBITS: The patient is status post bilateral lens replacement. No acute abnormality. SINUSES: No acute abnormality. SOFT TISSUES AND SKULL: No acute soft tissue abnormality. No skull fracture. VASCULATURE: There is calcification within the cavernous carotid arteries. IMPRESSION: 1. Late subacute infarct within the left occipital lobe and new subacute lacunar infarcts within the left thalamus. 2. Mild-to-moderate periventricular white matter disease. 3. Calcification within the cavernous carotid arteries. 4. Status post bilateral lens replacement. Electronically signed by: evalene coho 12/08/2023 01:37 PM EDT RP Workstation: [id number]   NM PLUVICTO  ADMINISTRATION Result Date: 12/02/2023 CLINICAL DATA:  89 year old male with prostate cancer. 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 EXAM: NUCLEAR MEDICINE PLUVICTO  INJECTION TECHNIQUE: Infusion: The nuclear medicine technologist and I  personally verified the dose activity to be delivered as specified in the written directive, and verified the patient identification via 2 separate methods. Initial flush of the intravenous catheter was performed was sterile saline. The dose syringe was connected to the catheter and the Lu-177 Pluvicto  administered over a 1 to 10 min infusion. Single 10 cc lushes with normal saline follow the dose. No complications were noted. The entire IV tubing, venocatheter, stopcock and syringes was removed in total, placed in a disposal bag and sent for assay of the residual activity, which will be reported at a later time in our EMR by the physics staff. Pressure was applied to the venipuncture site, and a compression bandage placed. Patient monitored for 1 hour following infusion. Radiation Safety personnel were present to perform the discharge survey, as detailed on their documentation. After a short period of observation, the patient had his IV removed. RADIOPHARMACEUTICALS:  211.9 microcuries Lu-177 PLUVICTO  FINDINGS: Current Infusion: 1 Planned Infusions: 6 Patient presented to nuclear medicine for treatment. Patient accompanied by son. Risk and benefits again explained and consent obtained. The patient's most recent blood counts were reviewed and remains a adequate candidate to proceed with Lu-177 Pluvicto . Hemoglobin equal 11.1. Normal white blood cell count. Normal platelet count. Creatinine equal 1.52 PSA equal greater than 1,500.  PSA equal 607 one  month prior. The patient was situated in an infusion suite with a contact barrier placed under the arm. Intravenous access was established, using sterile technique, and a normal saline infusion from a syringe was started. Micro-dosimetry: The prescribed radiation activity was assayed and confirmed to be within specified tolerance. IMPRESSION: Current Infusion: 1 Planned Infusions: 6 The patient tolerated the infusion well. The patient will return in one month for ongoing  care. Electronically Signed   By: Jackquline Boxer M.D.   On: 12/02/2023 15:41   NM Radiologist Eval And Mgmt Result Date: 11/26/2023 EXAM: NEW PATIENT OFFICE VISIT CHIEF COMPLAINT: Metastatic castrate resistant prostate carcinoma Current Pain Level: 1-10 HISTORY OF PRESENT ILLNESS: 89 year old male with castrate resistant metastatic prostate carcinoma. Initial diagnosis in 2022 with Gleason 4+5=9 prostate carcinoma with metastatic lymph nodes and metastatic bone disease. Patient has undergone androgen deprivation. Evidence of progression on androgen deprivation. Recent PSMA PET scan 11/26/2023 demonstrates marked progression radiotracer avid skeletal metastasis. Additional progression liver metastasis. PSA rapidly increasing and now greater than 1,000. REVIEW OF SYSTEMS: See epic note PHYSICAL EXAMINATION: See epic note ASSESSMENT AND PLAN: See epic note Electronically Signed   By: Jackquline Boxer M.D.   On: 11/26/2023 16:18   NM PET (PSMA) SKULL TO MID THIGH Result Date: 11/26/2023 CLINICAL DATA:  Prostate carcinoma with biochemical recurrence.  EXAM: NUCLEAR MEDICINE PET SKULL BASE TO THIGH TECHNIQUE: 8.5 mCi Flotufolastat (Posluma ) was injected intravenously. Full-ring PET imaging was performed from the skull base to thigh after the radiotracer. CT data was obtained and used for attenuation correction and anatomic localization. COMPARISON:  PSA PET scan 430 1,020 FINDINGS: NECK No radiotracer activity in neck lymph nodes. Incidental CT finding: None. CHEST No radiotracer accumulation within mediastinal or hilar lymph nodes. No suspicious pulmonary nodules on the CT scan. Incidental CT finding: None. ABDOMEN/PELVIS Prostate: No abnormal activity in the prostatectomy bed. Lymph nodes: No abnormal radiotracer accumulation within pelvic or abdominal nodes. Liver: There are several new radiotracer avid lymph nodes within the liver. Previously there were 2 or 3 discrete lesions. Now there are approximately 20  small lesions. The most intense lesion is in the lateral segment LEFT hepatic lobe with SUV max equal 50 on image 91. Incidental CT finding: Benign renal cysts. The RIGHT kidney is smaller than the LEFT. Atherosclerotic calcification of the aorta. SKELETON There is significant progression of skeletal metastasis. Previously there are proximally 50 lesions. Now the lesions number approximately 200 lesions . Essentially every vertebral body is involved. Extensive activity within the LEFT and RIGHT femurs. Multiple rib lesions. Bilateral shoulder girdles involved. Example lesion at L1 with SUV max equal 68.4. Interval internal fixation of the LEFT proximal femur. Prior fixation the RIGHT femur. IMPRESSION: 1. Significant progression of skeletal metastasis. 2. Significant progression of hepatic metastasis. 3. No evidence of local recurrence in the prostatectomy bed. 4.  Aortic Atherosclerosis (ICD10-I70.0). Electronically Signed   By: Jackquline Boxer M.D.   On: 11/26/2023 15:26   XR FEMUR MIN 2 VIEWS LEFT Result Date: 11/18/2023 X-rays of the left femur from 11/18/2023 were independently reviewed and interpreted, showing displaced intertrochanteric femur fracture which appears unchanged in terms of displacement and alignment from prior films on 10/14/2023.  There is a long cephalomedullary rod in place spanning the fracture.  The interlocking screws are without any lucency around them.  There is no lucency seen around the lag screws.  No new fracture seen.  XR Lumbar Spine 2-3 Views Result Date: 11/18/2023 XRs of the lumbar spine from 11/18/2023 were independent reviewed and interpreted, showing disc height loss at L4/5 and L5/S1.  No spondylolisthesis seen.  Compression fracture seen at L2.  No other fracture seen.  No dislocation seen.    PHYSICAL EXAM  Temp:  [97.6 F (36.4 C)-98.1 F (36.7 C)] 98 F (36.7 C) (08/06 1104) Pulse Rate:  [96-104] 104 (08/06 1104) Resp:  [16-18] 18 (08/06 1104) BP:  (117-145)/(66-84) 138/84 (08/06 1104) SpO2:  [97 %-100 %] 100 % (08/06 1104) Weight:  [57.7 kg] 57.7 kg (08/05 2046)  General - Well nourished, well developed, lethargic and drowsy.  Ophthalmologic - fundi not visualized due to noncooperation.  Cardiovascular - Regular rhythm and rate.  Neuro - lethargic and drowsy sleepy, eyes open to voice but not able to maintain eye opening without repetitive stimulation, not orientated to place, not corporative answering orientation questions.  Intangible words, not quite follow commands.  With eye opening, no gaze palsy, not tracking well, blinking to visual threat on the left but not on the right. No significant facial droop. Tongue protrusion not corporative. Bilateral UEs 2+/5 seems symmetrical. Bilaterally LEs withdraw to pain seems symmetrical. Sensation, coordination and gait not tested.   ASSESSMENT/PLAN Mr. Damarian Priola Kishi is a 89 y.o. male with history of prostate cancer metastasis to bone, diabetes, hypertension, hyperlipidemia admitted for lower extremity  weakness and difficulty walking for a week. No TNK given due to outside window.    Stroke:  left large PCA infarct involving left thalamus as well as right punctate cerebellar infarct, embolic likely secondary to hypercoagulable state from advanced malignancy CT left PCA territory infarct including left thalamus. MRI confirmed left PCA large infarct including left thalamus, as well as right cerebellar punctate infarct MRA head and neck left P2 occlusion 2D Echo EF 65 to 70% LE venous Doppler acute DVT involving left femoral vein and left peroneal veins LDL 58 HgbA1c pending Heparin  IV for VTE prophylaxis No antithrombotic prior to admission, now on heparin  IV.  Ongoing aggressive stroke risk factor management Therapy recommendations: SNF Disposition: Pending  Prostate cancer with metastasis Known prostate cancer with bone metastasis CT L-spine and pelvis showed widespread bone  metastasis with pathologic fracture  DVT LE venous Doppler acute DVT involving left femoral vein and left peroneal veins TCD bubble study pending On heparin  IV  Diabetes HgbA1c pending goal < 7.0 Controlled CBG monitoring SSI DM education and close PCP follow up  Hypertension Stable Long term BP goal normotensive  Hyperlipidemia Home meds: Lipitor 40 LDL 58, goal < 70 Now on Lipitor 40 Continue statin at discharge  AKI on CKD Baseline creatinine 1.5 This admission creatinine 2.93--2.90--2.86 On IV fluid BMP monitoring  Other Stroke Risk Factors Advanced age  Other Active Problems Hypothyroidism, TSH 10.9, on Synthroid  Anemia of chronic disease  Hospital day # 1  I discussed with Dr. Jonel. I spent extensive total face-to-face time with the patient and his daughters, reviewing test results, images and medication, and discussing the diagnosis, treatment plan and potential prognosis. This patient's care requiresreview of multiple databases, neurological assessment, discussion with family, other specialists and medical decision making of high complexity.  Ary Cummins, MD PhD Stroke Neurology 12/09/2023 7:47 PM    To contact Stroke Continuity provider, please refer to [email or web address]. After hours, contact General Neurology

## 2023-12-09 NOTE — Progress Notes (Signed)
 PHARMACY - ANTICOAGULATION CONSULT NOTE  Pharmacy Consult for heparin  Indication: acute DVT  Allergies  Allergen Reactions   Beef-Derived Drug Products Other (See Comments)    Vegetarian   Chicken Allergy Other (See Comments)    Vegetarian   Fish Allergy Other (See Comments)    Vegetarian    Pork-Derived Products Other (See Comments)    Vegetarian     Patient Measurements: Height: 5' 8 (172.7 cm) Weight: 57.7 kg (127 lb 3.3 oz) IBW/kg (Calculated) : 68.4 HEPARIN  DW (KG): 60  Vital Signs: Temp: 97.7 F (36.5 C) (08/06 2002) Temp Source: Oral (08/06 2002) BP: 134/61 (08/06 2002) Pulse Rate: 99 (08/06 2002)  Labs: Recent Labs    12/08/23 1520 12/08/23 1557 12/09/23 0449 12/09/23 2122  HGB 10.4* 11.6* 10.1*  --   HCT 32.5* 34.0* 31.6*  --   PLT 150  --  145*  --   LABPROT 17.9*  --   --   --   INR 1.4*  --   --   --   HEPARINUNFRC  --   --   --  0.67  CREATININE 2.93* 2.90* 2.86*  --     Estimated Creatinine Clearance: 14.6 mL/min (A) (by C-G formula based on SCr of 2.86 mg/dL (H)).   Assessment:  89 y.o. male with medical history significant for prostate cancer with metastases who is admitted for multiple falls found to have a stroke. US  doppler confirmed acute deep vein thrombosis involving the left femoral vein, and left peroneal veins. Patient not on anticoagulation PTA. Pharmacy consulted to initiate IV heparin . No bolus due to recent stroke.   Initially started on DAPT per neurology, ok to stop with therapeutic anticoagulation being initiated. CBC wnl, platelets low stable 150 > 145.   Heparin  level supratherapeutic (0.67) on infusion at 1000 units/hr. No bleeding noted.  Goal of Therapy:  Heparin  level 0.3-0.5 units/ml Monitor platelets by anticoagulation protocol: Yes   Plan:  Decrease heparin  infusion to 900 units/hr F/u 8 hr heparin  level  Thank you for allowing pharmacy to be a part of this patient's care.  Shelba Collier, PharmD,  BCPS Clinical Pharmacist

## 2023-12-09 NOTE — Evaluation (Signed)
 Occupational Therapy Evaluation Patient Details Name: Marco [name] MRN: [id number] DOB: 01/18/1934 Today's Date: 12/09/2023   History of Present Illness   Pt is a 89 y.o. male presenting to [facility] after a fall and inability to walk, found to have subacute L1 fx. 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 PMH significant for advanced stage IV prostate cancer with bony mets, Type II DM, L hip fx s/p IM nailing 09/2023, CAD, CKD III     Clinical Impressions Pt admitted with above diagnosis. Pt lethargic on eval; daughter in-law present and verifies home setup / PLOF. Prior to admission, pt has required increased assist for all ADLs/transfers from family with functional decline since fall in May. Pt alert to self only, follows 1-step commands inconsistently and requires TOTAL A +2 for bed mobility, unable to maintain balance sitting EOB without MOD - MAX A due to posterior + right lateral lean. Pt unable to achieve orientation to midline while seated with max multimodal cuing. Presents with R-sided gaze preference, no active visual tracking of objects noted. Requires MAX A with +2 HHA for standing attempt, unable to safely transfer at this time. Anticipate pt will require use of lift equipment for transfers with nursing staff, and MAX - TOTAL A for bed level ADLs due to impairments in cognition, vision, balance, strength and mobility. Pt would benefit from skilled OT services to address noted impairments and functional limitations (see below for any additional details) in order to maximize safety and independence while minimizing falls risk and caregiver burden. Patient will benefit from continued inpatient follow up therapy, <3 hours/day.      If plan is discharge home, recommend the following:   Two people to help with walking and/or transfers;Two people to help with  bathing/dressing/bathroom;Assistance with cooking/housework;Assistance with feeding;Direct supervision/assist for medications management;Direct supervision/assist for financial management;Assist for transportation;Help with stairs or ramp for entrance;Supervision due to cognitive status     Functional Status Assessment   Patient has had a recent decline in their functional status and demonstrates the ability to make significant improvements in function in a reasonable and predictable amount of time.     Equipment Recommendations   None recommended by OT      Precautions/Restrictions   Precautions Precautions: Fall Recall of Precautions/Restrictions: Impaired Precaution/Restrictions Comments: No spinal prec or brace per MD (L1 subacute fx) Restrictions Weight Bearing Restrictions Per Provider Order: No     Mobility Bed Mobility Overal bed mobility: Needs Assistance Bed Mobility: Supine to Sit, Sit to Supine     Supine to sit: Total assist, +2 for physical assistance, HOB elevated Sit to supine: +2 for physical assistance, +2 for safety/equipment, HOB elevated, Total assist   General bed mobility comments: pt with limited movement initiation; does move LLE towards EOB but ultimately requires TOTAL A +2 to transition to seated position    Transfers Overall transfer level: Needs assistance Equipment used: 2 person hand held assist Transfers: Sit to/from Stand Sit to Stand: Max assist, +2 physical assistance           General transfer comment: max-total +2 HHA to stand. pt requires assist to maintain upright, unsafe to attempt transfers at this time.      Balance Overall balance assessment: Needs assistance, History of Falls Sitting-balance support: Feet unsupported, Feet supported Sitting balance-Leahy Scale: Poor Sitting balance - Comments: pt requires mod-max for upright positioning at EOB due  to posterior / R lateral lean. Postural control: Right lateral lean,  Posterior lean Standing balance support: Bilateral upper extremity supported Standing balance-Leahy Scale: Zero Standing balance comment: MAX A +2 for standing attempt                           ADL either performed or assessed with clinical judgement   ADL Overall ADL's : Needs assistance/impaired Eating/Feeding: Bed level;Maximal assistance;Total assistance   Grooming: Bed level;Maximal assistance;Total assistance   Upper Body Bathing: Bed level;Maximal assistance;Total assistance   Lower Body Bathing: Bed level;Maximal assistance;Total assistance   Upper Body Dressing : Maximal assistance;Bed level;Total assistance   Lower Body Dressing: Bed level;Maximal assistance;Total assistance     Toilet Transfer Details (indicate cue type and reason): unsafe to attempt Toileting- Clothing Manipulation and Hygiene: Bed level;Total assistance       Functional mobility during ADLs: Maximal assistance;Total assistance;+2 for physical assistance General ADL Comments: pt limited by cognition, weakness, balance and vision. anticipate need for lift equipment for safe tranfsers with nursing staff.     Vision Baseline Vision/History: 1 Wears glasses Ability to See in Adequate Light: 1 Impaired Patient Visual Report: Other (comment) (pt unable to identify colors) Vision Assessment?: Vision impaired- to be further tested in functional context;Wears glasses for driving;Wears glasses for reading            Pertinent Vitals/Pain Pain Assessment Pain Assessment: Faces Faces Pain Scale: Hurts little more Pain Location: general grimacing with move60ment Pain Descriptors / Indicators: Grimacing     Extremity/Trunk Assessment Upper Extremity Assessment Upper Extremity Assessment: RUE deficits/detail;LUE deficits/detail RUE Deficits / Details: pt uses BUE for support while seated EOB; difficulties following command for functional reach. at least 3/5 RUE Coordination: decreased fine  motor;decreased gross motor LUE Deficits / Details: pt uses BUE for support while seated EOB; difficulties following command for functional reach. at least 3/5 LUE Coordination: decreased fine motor;decreased gross motor   Lower Extremity Assessment Lower Extremity Assessment: Defer to PT evaluation RLE Deficits / Details: At least 3/5, difficulty to assess due to cognition RLE Sensation: WNL (reports numbness/tingling) LLE Deficits / Details: At least 3/5, difficulty to assess due to cognition LLE Sensation: WNL (reports numbness/tingling)   Cervical / Trunk Assessment Cervical / Trunk Assessment: Other exceptions Cervical / Trunk Exceptions: L1 subacute fx   Communication Communication Communication: Impaired Factors Affecting Communication: Non - English speaking, interpreter not available   Cognition Arousal: Lethargic Behavior During Therapy: Flat affect Cognition: Difficult to assess Difficult to assess due to: Non-English speaking           OT - Cognition Comments: cognition impaired, pt interacts minimally with therapy team, slow processing and does not follow commands.                 Following commands: Impaired Following commands impaired: Follows one step commands inconsistently     Cueing  General Comments   Cueing Techniques: Verbal cues;Gestural cues;Tactile cues;Visual cues  Daughter in law present and verified PLOF/Home setup           Home Living Family/patient expects to be discharged to:: Private residence Living Arrangements: Children Available Help at Discharge: Family;Available PRN/intermittently Type of Home: House Home Access: Level entry     Home Layout: Two level Alternate Level Stairs-Number of Steps: flight Alternate Level Stairs-Rails: None Bathroom Shower/Tub: Producer, television/film/video: Standard (with BSC over toilet) Bathroom Accessibility: Yes   Home Equipment: Grab  bars - tub/shower;Shower Counsellor  (2 wheels);Hospital bed;Wheelchair - manual;BSC/3in1   Additional Comments: does not leave his room, room is upstairs      Prior Functioning/Environment Prior Level of Function : Needs assist;History of Falls (last six months);Patient poor historian/Family not available             Mobility Comments: family assisting with tranfsers and mobility, ambulating with RW. family pushing him in [facility] when outside of the home. 2 recent falls ADLs Comments: family assisting with ADLs as needed, uses urinal. Has needed increased assist the past week    OT Problem List: Decreased strength;Decreased range of motion;Decreased activity tolerance;Impaired balance (sitting and/or standing);Impaired vision/perception;Decreased coordination;Decreased cognition;Decreased safety awareness;Decreased knowledge of use of DME or AE;Decreased knowledge of precautions;Impaired sensation;Impaired UE functional use   OT Treatment/Interventions: Self-care/ADL training;Therapeutic exercise;Neuromuscular education;DME and/or AE instruction;Therapeutic activities;Cognitive remediation/compensation;Visual/perceptual remediation/compensation;Patient/family education;Balance training;Splinting      OT Goals(Current goals can be found in the care plan section)   Acute Rehab OT Goals OT Goal Formulation: With family Time For Goal Achievement: 12/23/23 Potential to Achieve Goals: Fair   OT Frequency:  Min 2X/week    Co-evaluation PT/OT/SLP Co-Evaluation/Treatment: Yes Reason for Co-Treatment: Complexity of the patient's impairments (multi-system involvement);Necessary to address cognition/behavior during functional activity;For patient/therapist safety;To address functional/ADL transfers PT goals addressed during session: Mobility/safety with mobility;Balance OT goals addressed during session: ADL's and self-care;Strengthening/ROM      AM-PAC OT 6 Clicks Daily Activity     Outcome Measure Help from another person  eating meals?: Total Help from another person taking care of personal grooming?: Total Help from another person toileting, which includes using toliet, bedpan, or urinal?: Total Help from another person bathing (including washing, rinsing, drying)?: Total Help from another person to put on and taking off regular upper body clothing?: Total Help from another person to put on and taking off regular lower body clothing?: Total 6 Click Score: 6   End of Session Nurse Communication: Need for lift equipment;Mobility status  Activity Tolerance: Patient tolerated treatment well Patient left: in bed;with call bell/phone within reach;with family/visitor present;with bed alarm set  OT Visit Diagnosis: Muscle weakness (generalized) (M62.81);Repeated falls (R29.6);Other abnormalities of gait and mobility (R26.89);Other symptoms and signs involving the nervous system (R29.898);History of falling (Z91.81)                Time: 1130-1200 OT Time Calculation (min): 30 min Charges:  OT General Charges $OT Visit: 1 Visit OT Evaluation $OT Eval Moderate Complexity: 1 Mod Jocabed Cheese L. Raif Chachere, OTR/L  12/09/23, 3:16 PM   Marco [name] 12/09/2023, 3:15 PM

## 2023-12-09 NOTE — Progress Notes (Signed)
  Progress Note   Patient: Marco [name] FMW:[id number] DOB: 19-Sep-1933 DOA: 12/08/2023     1 DOS: the patient was seen and examined on 12/09/2023 at 9:23AM      Brief hospital course: 89 y.o. M with metastatic prostate CA, DM, HTN, and prior amaurosis fugax who presented with acute on chronic weakness and falls.  In the ER, found to have AKI and stroke.     Assessment and Plan: Acute renal failure on CKD IIIb Acute metabolic acidosis Baseline Cr 8.6-8.4, CKD IIIb Cr on admission 2.9, no improvement overnight with fluids UA without sediment.  Renal ultrasound normal, FeNA elevated.  Poor PO intake per family.  ?NSAIDs - Avoid nephrotoxins and hypotension - IV fluids   Acute stroke MRI brain showed acute left PCA infarct, punctate right cerebellar infarct.  MR angiogram showed no significant stenoses.  LE dopplers showed extensive clot. - Echocardiogram - Hold DAPT given anticoagulation - Start heparin  gtt - LDL ordered - Continue Lipitor - PT/OT - [facility]  Lower extremity DVT - Start heparin   Metastatic prostate cancer On Xtandi  as an outpatient  Diabetes Glucose controled -Hold home metformin  - Continue SS corrections  Hypertension Not on medication at the present - Permissive HTN  Hypothyroidism - Continue levotyhroxine  Normocytic anemia No bleeding reported or osberved       Subjective: Patient remains somewhat confused, no fever, no respiratory distress, no vomiting, no pain complaints     Physical Exam: BP 138/84 (BP Location: Left Arm)   Pulse (!) 104   Temp 98 F (36.7 C)   Resp 18   Ht 5' 8 (1.727 m)   Wt 57.7 kg   SpO2 100%   BMI 19.34 kg/m   Thin elderly adult male, lying in bed, appears weak and tired Tachycardic, regular, no murmurs, no peripheral edema Respiratory rate normal, lungs clear without rales or wheezes Abdomen soft no tenderness palpation or guarding, no ascites or distention He has generalized  weakness, he does not make eye contact, he is oriented to self, his answers are slowed, he does not know he is in the hospital, or where he is, he is redirectable, he believes that the month is August, but does not know the year    Data Reviewed: Discussed with neurology and pharmacy Basic metabolic panel shows acute metabolic acidosis, stable renal insufficiency CBC shows mild anemia      Family Communication: DIL at bedside    Disposition: Status is: Inpatient         Author: Lonni SHAUNNA Dalton, MD 12/09/2023 1:59 PM  For on call review [email or web address].

## 2023-12-09 NOTE — Evaluation (Addendum)
 Physical Therapy Evaluation Patient Details Name: Marco [name] MRN: [id number] DOB: 27-Dec-1933 Today's Date: 12/09/2023  History of Present Illness  Pt is a 89 y.o. male presenting to [facility] after a fall and inability to walk, found to have subacute L1 fx. 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 Pt also with acute LLE DVT. PMH significant for advanced stage IV prostate cancer with bony mets, Type II DM, L hip fx s/p IM nailing 09/2023, CAD, CKD III   Clinical Impression  PTA, pt needed assist to stand and ambulate to the bathroom with RW. Pt primarily stays on the second floor of the house and uses a manual wheelchair with family pushing when in the community. Pt was lethargic in today's session and was only able to state his name. Intermittent following of commands with increased time and effort. TotalAx2 for bed mobility and ModA/MaxA for seated balance. Pt tends to lean posteriorly and to the right with no righting responses. MaxAx2 for stand attempt with [facility] and use of gait belt/bed pad to boost-up. Pt has intermittent level of assist available at home. Recommending post-acute rehab <3hrs to work towards independence with mobility. Pt would benefit from acute skilled PT with current functional limitations listed below (see PT Problem List). Acute PT to follow.         If plan is discharge home, recommend the following: A lot of help with walking and/or transfers;A lot of help with bathing/dressing/bathroom;Assistance with cooking/housework;Direct supervision/assist for medications management;Direct supervision/assist for financial management;Assist for transportation;Help with stairs or ramp for entrance   Can travel by private vehicle   No    Equipment Recommendations None recommended by PT (family owns equipment)     Functional Status Assessment Patient has had a recent decline in  their functional status and/or demonstrates limited ability to make significant improvements in function in a reasonable and predictable amount of time     Precautions / Restrictions Precautions Precautions: Fall Recall of Precautions/Restrictions: Impaired Precaution/Restrictions Comments: No spinal prec or brace per MD (L1 subacute fx) Restrictions Weight Bearing Restrictions Per Provider Order: No      Mobility  Bed Mobility Overal bed mobility: Needs Assistance Bed Mobility: Supine to Sit, Sit to Supine    Supine to sit: Total assist, +2 for physical assistance, HOB elevated Sit to supine: +2 for physical assistance, +2 for safety/equipment, HOB elevated, Total assist   General bed mobility comments: pt with limited movement initiation; does move LLE towards EOB but ultimately requires TOTAL A +2 to transition to seated position    Transfers Overall transfer level: Needs assistance Equipment used: 2 person hand held assist Transfers: Sit to/from Stand Sit to Stand: Max assist, +2 physical assistance      General transfer comment: max-total +2 HHA to stand. pt requires assist to maintain upright, unsafe to attempt transfers at this time.    Ambulation/Gait  General Gait Details: unable this date  Modified Rankin (Stroke Patients Only) Modified Rankin (Stroke Patients Only) Pre-Morbid Rankin Score: Moderately severe disability Modified Rankin: Severe disability     Balance Overall balance assessment: Needs assistance, History of Falls Sitting-balance support: Feet unsupported, Feet supported Sitting balance-Leahy Scale: Poor Sitting balance - Comments: pt requires mod-max for upright positioning at EOB due to posterior / R lateral lean. Postural control: Right lateral lean, Posterior lean Standing balance support: Bilateral upper extremity supported Standing balance-Leahy Scale: Zero Standing  balance comment: MAX A +2 for standing attempt       Pertinent  Vitals/Pain Pain Assessment Pain Assessment: Faces Faces Pain Scale: Hurts little more Pain Location: general grimacing with move24ment Pain Descriptors / Indicators: Grimacing Pain Intervention(s): Limited activity within patient's tolerance, Monitored during session, Repositioned    Home Living Family/patient expects to be discharged to:: Private residence Living Arrangements: Children Available Help at Discharge: Family;Available PRN/intermittently Type of Home: House Home Access: Level entry     Alternate Level Stairs-Number of Steps: flight Home Layout: Two level Home Equipment: Grab bars - tub/shower;Shower Counsellor (2 wheels);Hospital bed;Wheelchair - manual;BSC/3in1 Additional Comments: does not leave his room, room is upstairs    Prior Function Prior Level of Function : Needs assist;History of Falls (last six months);Patient poor historian/Family not available    Mobility Comments: family assisting with tranfsers and mobility, ambulating with RW. family pushing him in [facility] when outside of the home. 2 recent falls ADLs Comments: family assisting with ADLs as needed, uses urinal. Has needed increased assist the past week     Extremity/Trunk Assessment   Upper Extremity Assessment Upper Extremity Assessment: Defer to OT evaluation RUE Deficits / Details: pt uses BUE for support while seated EOB; difficulties following command for functional reach. RUE Coordination: decreased fine motor;decreased gross motor LUE Coordination: decreased fine motor;decreased gross motor    Lower Extremity Assessment Lower Extremity Assessment: RLE deficits/detail;LLE deficits/detail RLE Deficits / Details: At least 3/5, difficulty to assess due to cognition RLE Sensation: WNL (reports numbness/tingling) LLE Deficits / Details: At least 3/5, difficulty to assess due to cognition LLE Sensation: WNL (reports numbness/tingling)    Cervical / Trunk Assessment Cervical / Trunk  Assessment: Other exceptions Cervical / Trunk Exceptions: L1 subacute fx  Communication   Communication Communication: Impaired Factors Affecting Communication: Non - English speaking, interpreter not available (Able to speak english with wife occasionally translating)    Cognition Arousal: Lethargic Behavior During Therapy: Flat affect   PT - Cognitive impairments: Difficult to assess, Awareness, Memory, Attention, Initiation, Sequencing, Problem solving, Safety/Judgement, Orientation Difficult to assess due to: Level of arousal Orientation impairments: Person    PT - Cognition Comments: Able to state name with inability to answer remaining orientation questions. Lethargic with repetitive cues necessary for pt to follow commands. Following commands: Impaired Following commands impaired: Follows one step commands inconsistently     Cueing Cueing Techniques: Verbal cues, Gestural cues, Tactile cues, Visual cues     General Comments General comments (skin integrity, edema, etc.): Daughter in law present and supportive during session. Answered home set-up questions     PT Assessment Patient needs continued PT services  PT Problem List Decreased strength;Decreased activity tolerance;Decreased balance;Decreased mobility;Decreased coordination;Decreased knowledge of use of DME;Decreased cognition;Decreased safety awareness;Decreased knowledge of precautions       PT Treatment Interventions DME instruction;Gait training;Functional mobility training;Therapeutic activities;Therapeutic exercise;Balance training;Neuromuscular re-education;Patient/family education    PT Goals (Current goals can be found in the Care Plan section)  Acute Rehab PT Goals Patient Stated Goal: unable to state goal PT Goal Formulation: With family Time For Goal Achievement: 12/23/23 Potential to Achieve Goals: Fair    Frequency Min 2X/week     Co-evaluation   Reason for Co-Treatment: Complexity of the  patient's impairments (multi-system involvement);Necessary to address cognition/behavior during functional activity;For patient/therapist safety;To address functional/ADL transfers PT goals addressed during session: Mobility/safety with mobility;Balance OT goals addressed during session: ADL's and self-care;Strengthening/ROM       AM-PAC PT 6 Clicks Mobility  Outcome Measure Help needed turning from your back to your side while in a flat bed without using bedrails?: Total Help needed moving from lying on your back to sitting on the side of a flat bed without using bedrails?: Total Help needed moving to and from a bed to a chair (including a wheelchair)?: Total Help needed standing up from a chair using your arms (e.g., wheelchair or bedside chair)?: Total Help needed to walk in hospital room?: Total Help needed climbing 3-5 steps with a railing? : Total 6 Click Score: 6    End of Session Equipment Utilized During Treatment: Gait belt Activity Tolerance: Patient limited by fatigue;Patient limited by lethargy Patient left: in bed;with call bell/phone within reach;with bed alarm set;with family/visitor present Nurse Communication: Mobility status PT Visit Diagnosis: Unsteadiness on feet (R26.81);Other abnormalities of gait and mobility (R26.89);Muscle weakness (generalized) (M62.81);History of falling (Z91.81)    Time: 1130-1200 PT Time Calculation (min) (ACUTE ONLY): 30 min   Charges:   PT Evaluation $PT Eval Moderate Complexity: 1 Mod   PT General Charges $$ ACUTE PT VISIT: 1 Visit       Kate ORN, PT, DPT Secure Chat Preferred  Rehab Office [phone]   Kate BRAVO Wendolyn 12/09/2023, 2:19 PM

## 2023-12-09 NOTE — TOC Initial Note (Signed)
 Transition of Care [facility]) - Initial/Assessment Note    Patient Details  Name: Marco [name] MRN: [id number] Date of Birth: 02-15-1934  Transition of Care [facility]) CM/SW Contact:    Andrez JULIANNA George, RN Phone Number: 12/09/2023, 11:55 AM  Clinical Narrative:                  CM met with the patient and his daughter in law. She states they are requesting SNF rehab with a transition to long term care. She says she has reached out to [employer] and is asking that this facility be their first choice. She asked that [facility] be the 2 nd choice.  Therapies currently working with the patient. Will send pts information through the HUB once notes are entered.  DIL states she and spouse work during the daytime and are not able to provide the care pt needs currently.  IP Care Management following.  Expected Discharge Plan: Skilled Nursing Facility Barriers to Discharge: Continued Medical Work up   Patient Goals and CMS Choice   CMS Medicare.gov Compare Post Acute Care list provided to:: Patient Represenative (must comment) Choice offered to / list presented to : Adult Children      Expected Discharge Plan and Services In-house Referral: Clinical Social Work Discharge Planning Services: CM Consult Post Acute Care Choice: Skilled Nursing Facility Living arrangements for the past 2 months: Single Family Home                                      Prior Living Arrangements/Services Living arrangements for the past 2 months: Single Family Home Lives with:: Adult Children Patient language and need for interpreter reviewed:: Yes Do you feel safe going back to the place where you live?: Yes          Current home services: DME (walker/ hospital bed/ shower seat) Criminal Activity/Legal Involvement Pertinent to Current Situation/Hospitalization: No - Comment as needed  Activities of Daily Living   ADL Screening (condition at time of admission) Independently performs ADLs?: No Does  the patient have a NEW difficulty with bathing/dressing/toileting/self-feeding that is expected to last >3 days?: Yes (Initiates electronic notice to provider for possible OT consult) Does the patient have a NEW difficulty with getting in/out of bed, walking, or climbing stairs that is expected to last >3 days?: Yes (Initiates electronic notice to provider for possible PT consult) Does the patient have a NEW difficulty with communication that is expected to last >3 days?: Yes (Initiates electronic notice to provider for possible SLP consult) Is the patient deaf or have difficulty hearing?: No Does the patient have difficulty seeing, even when wearing glasses/contacts?: Yes Does the patient have difficulty concentrating, remembering, or making decisions?: Yes  Permission Sought/Granted                  Emotional Assessment Appearance:: Appears stated age   Affect (typically observed): Quiet Orientation: : Oriented to Self, Oriented to  Time   Psych Involvement: No (comment)  Admission diagnosis:  Stroke [facility]) [I63.9] AKI (acute kidney injury) (HCC) [N17.9] Fall, initial encounter [W19.XXXA] Cerebrovascular accident (CVA), unspecified mechanism (HCC) [I63.9] Closed compression fracture of L2 vertebra, initial encounter [facility]) [S32.020A] Patient Active Problem List   Diagnosis Date Noted   Stroke [facility]) 12/08/2023   Iron  deficiency anemia 10/30/2023   Genetic testing 10/06/2023   Intertrochanteric fracture of left femur (HCC) 09/27/2023   History  of CAD (coronary artery disease) 09/27/2023   Callus 03/05/2023   Hallux limitus of left foot 03/05/2023   Fall at home, initial encounter 02/11/2022   Prostate cancer metastatic to bone (HCC) 05/29/2021   CKD (chronic kidney disease), stage III (HCC) 02/11/2021   CAD (coronary artery disease) 03/06/2020   Hypothyroidism 12/02/2017   Chronic pain of right knee 10/13/2017   Chest pain 11/19/2016   Amaurosis fugax 12/12/2015   PCP NOTES  >>> 03/08/2015   Osteoporosis 09/15/2014   Primary osteoarthritis of both knees 03/16/2014   Hearing difficulty 10/20/2013   DJD (degenerative joint disease) 08/25/2013   Neck mass 04/25/2013   Hyperlipidemia 04/25/2013   Erectile dysfunction 12/26/2011   Annual physical exam 08/22/2011   Prostate nodule 08/22/2011   Non-insulin  dependent type 2 diabetes mellitus (HCC) 05/07/2011   PCP:  Amon Aloysius BRAVO, MD Pharmacy:   [id number] - [place], [place] - [address] [place] [address] Phone: [phone] Fax: [phone]  [facility] - [place], [place] - [address] [place] [address] Phone: [phone] Fax: [phone]  [facility] - [place], [place] - [address] [place] [address] Phone: [phone] Fax: [phone]  [name] LONG - [facility] [address] [place] [address] Phone: [phone] Fax: [phone]     Social Drivers of Health (SDOH) Social History: SDOH Screenings   Food Insecurity: No Food Insecurity (12/08/2023)  Housing: Low Risk  (12/08/2023)  Transportation Needs: No Transportation Needs (12/08/2023)  Utilities: Not At Risk (12/08/2023)  Alcohol  Screen: Low Risk  (09/04/2021)  Depression (PHQ2-9): Low Risk  (11/23/2023)  Financial Resource Strain: Low Risk  (10/30/2021)  Physical Activity: Inactive (10/25/2020)  Social Connections: Moderately Isolated (12/08/2023)  Stress: No Stress Concern Present (09/04/2021)  Tobacco Use: Low Risk  (12/08/2023)   SDOH Interventions:     Readmission Risk Interventions     No data to display

## 2023-12-10 ENCOUNTER — Inpatient Hospital Stay (HOSPITAL_COMMUNITY)

## 2023-12-10 DIAGNOSIS — I6340 Cerebral infarction due to embolism of unspecified cerebral artery: Secondary | ICD-10-CM

## 2023-12-10 DIAGNOSIS — C7951 Secondary malignant neoplasm of bone: Secondary | ICD-10-CM | POA: Diagnosis not present

## 2023-12-10 DIAGNOSIS — I63432 Cerebral infarction due to embolism of left posterior cerebral artery: Secondary | ICD-10-CM | POA: Diagnosis not present

## 2023-12-10 DIAGNOSIS — C661 Malignant neoplasm of right ureter: Secondary | ICD-10-CM | POA: Diagnosis not present

## 2023-12-10 DIAGNOSIS — I639 Cerebral infarction, unspecified: Secondary | ICD-10-CM | POA: Diagnosis not present

## 2023-12-10 DIAGNOSIS — I63441 Cerebral infarction due to embolism of right cerebellar artery: Secondary | ICD-10-CM | POA: Diagnosis not present

## 2023-12-10 LAB — COMPREHENSIVE METABOLIC PANEL WITH GFR
ALT: 17 U/L (ref 0–44)
AST: 32 U/L (ref 15–41)
Albumin: 2.8 g/dL — ABNORMAL LOW (ref 3.5–5.0)
Alkaline Phosphatase: 130 U/L — ABNORMAL HIGH (ref 38–126)
Anion gap: 12 (ref 5–15)
BUN: 39 mg/dL — ABNORMAL HIGH (ref 8–23)
CO2: 15 mmol/L — ABNORMAL LOW (ref 22–32)
Calcium: 8 mg/dL — ABNORMAL LOW (ref 8.9–10.3)
Chloride: 111 mmol/L (ref 98–111)
Creatinine, Ser: 2.81 mg/dL — ABNORMAL HIGH (ref 0.61–1.24)
GFR, Estimated: 21 mL/min — ABNORMAL LOW (ref >60)
Glucose, Bld: 126 mg/dL — ABNORMAL HIGH (ref 70–99)
Potassium: 4.9 mmol/L (ref 3.5–5.1)
Sodium: 138 mmol/L (ref 135–145)
Total Bilirubin: 1.1 mg/dL (ref 0.0–1.2)
Total Protein: 6.3 g/dL — ABNORMAL LOW (ref 6.5–8.1)

## 2023-12-10 LAB — CBC
HCT: 33.3 % — ABNORMAL LOW (ref 39.0–52.0)
Hemoglobin: 10.4 g/dL — ABNORMAL LOW (ref 13.0–17.0)
MCH: 28 pg (ref 26.0–34.0)
MCHC: 31.2 g/dL (ref 30.0–36.0)
MCV: 89.8 fL (ref 80.0–100.0)
Platelets: 192 K/uL (ref 150–400)
RBC: 3.71 MIL/uL — ABNORMAL LOW (ref 4.22–5.81)
RDW: 15.3 % (ref 11.5–15.5)
WBC: 6.3 K/uL (ref 4.0–10.5)
nRBC: 0 % (ref 0.0–0.2)

## 2023-12-10 LAB — HEMOGLOBIN A1C
Hgb A1c MFr Bld: 5.9 % — ABNORMAL HIGH (ref 4.8–5.6)
Mean Plasma Glucose: 123 mg/dL

## 2023-12-10 LAB — GLUCOSE, CAPILLARY
Glucose-Capillary: 135 mg/dL — ABNORMAL HIGH (ref 70–99)
Glucose-Capillary: 179 mg/dL — ABNORMAL HIGH (ref 70–99)
Glucose-Capillary: 150 mg/dL — ABNORMAL HIGH (ref 70–99)
Glucose-Capillary: 151 mg/dL — ABNORMAL HIGH (ref 70–99)

## 2023-12-10 LAB — HEPARIN LEVEL (UNFRACTIONATED): Heparin Unfractionated: 0.9 [IU]/mL — ABNORMAL HIGH (ref 0.30–0.70)

## 2023-12-10 MED ORDER — HEPARIN (PORCINE) 25000 UT/250ML-% IV SOLN: 700.0000 [IU]/h | INTRAVENOUS | Status: DC

## 2023-12-10 MED ORDER — HEPARIN (PORCINE) 25000 UT/250ML-% IV SOLN
750.0000 [IU]/h | INTRAVENOUS | Status: DC
  Administered 2023-12-10 – 2023-12-12 (×3): 700 [IU]/h via INTRAVENOUS
  Filled 2023-12-10 (×2): qty 250

## 2023-12-10 MED ORDER — SODIUM CHLORIDE 0.9 % IV SOLN: INTRAVENOUS | Status: DC

## 2023-12-10 NOTE — Progress Notes (Signed)
 IVT consult placed for additional IV access. Patient has heparin  gtt and order for IV fluids. IV placed by IVT on 8/7 with ultrasound. Spoke with pharmacy to determine if heparin  and normal saline infusion (150 mL/hr) were compatible. Per pharmacist, they're comfortable with maintenance fluids being run through the same line as heparin  drip as long as IV is working ok. Spoke with Bobetta, primary RN who determined IV is patent and running without concern. No additional access indicated at this time. If needs change, IVT can be re-consulted and will re-assess.   Josephus Harriger R Jamani Eley, RN

## 2023-12-10 NOTE — Evaluation (Addendum)
 Speech Language Pathology Evaluation Patient Details Name: Marco [name] MRN: [id number] DOB: February 19, 1934 Today's Date: 12/10/2023 Time: 9182-9166 SLP Time Calculation (min) (ACUTE ONLY): 16 min  Problem List:  Patient Active Problem List   Diagnosis Date Noted   Stroke (HCC) 12/08/2023   Iron  deficiency anemia 10/30/2023   Genetic testing 10/06/2023   Intertrochanteric fracture of left femur (HCC) 09/27/2023   History of CAD (coronary artery disease) 09/27/2023   Callus 03/05/2023   Hallux limitus of left foot 03/05/2023   Fall at home, initial encounter 02/11/2022   Prostate cancer metastatic to bone (HCC) 05/29/2021   CKD (chronic kidney disease), stage III (HCC) 02/11/2021   CAD (coronary artery disease) 03/06/2020   Hypothyroidism 12/02/2017   Chronic pain of right knee 10/13/2017   Chest pain 11/19/2016   Amaurosis fugax 12/12/2015   PCP NOTES >>> 03/08/2015   Osteoporosis 09/15/2014   Primary osteoarthritis of both knees 03/16/2014   Hearing difficulty 10/20/2013   DJD (degenerative joint disease) 08/25/2013   Neck mass 04/25/2013   Hyperlipidemia 04/25/2013   Erectile dysfunction 12/26/2011   Annual physical exam 08/22/2011   Prostate nodule 08/22/2011   Non-insulin  dependent type 2 diabetes mellitus (HCC) 05/07/2011   Past Medical History:  Past Medical History:  Diagnosis Date   Amaurosis fugax 2017   Diabetes mellitus    DJD (degenerative joint disease) 08/25/2013   Elevated PSA    Other and unspecified hyperlipidemia 04/25/2013   Prostate cancer [facility])    Past Surgical History:  Past Surgical History:  Procedure Laterality Date   CATARACT EXTRACTION     Bilaterally   FRACTURE SURGERY Right    rod 2020   INTRAMEDULLARY (IM) NAIL INTERTROCHANTERIC Left 09/27/2023   Procedure: FIXATION, FRACTURE, INTERTROCHANTERIC, WITH INTRAMEDULLARY ROD;  Surgeon: Georgina Ozell LABOR, MD;  Location: [facility] OR;  Service: Orthopedics;  Laterality: Left;   TYMPANIC MEMBRANE  REPAIR     R   HPI:  Marco [name] is a 89 y.o. 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   Assessment / Plan / Recommendation Clinical Impression  Yesterday pt's sister in law stated that pt's English is exceptional. Today he presents with significant cognitive-linguistic impairments with increased deficits in processing time compared to yesterday, right inattention and decreased initiation. Followed commands to 50%. Only oriented to name and city, stating he was in [employer]. He named common objects with 20% accuracy and was perseverative and had question of neologisms (question if word was in his native language). Able to repeat single word x 1. SLP. He was able to state he lives with his daughter. He has mild dysarthria with decreased vocal intensity. ST will need to continue working with pt and will further determine language impairments versus cognition or combination.    SLP Assessment  SLP Recommendation/Assessment: Patient needs continued Speech Language Pathology Services SLP Visit Diagnosis: Dysphagia, unspecified (R13.10)     Assistance Recommended at Discharge  Frequent or constant Supervision/Assistance  Functional Status Assessment Patient has had a recent decline in their functional status and demonstrates the ability to make significant improvements in function in a reasonable  and predictable amount of time.  Frequency and Duration min 2x/week  2 weeks      SLP Evaluation Cognition  Overall  Cognitive Status: Impaired/Different from baseline Arousal/Alertness: Awake/alert Orientation Level: Oriented to person;Disoriented to situation;Disoriented to place (needed cues for birthday, not oriented to age) Attention: Sustained Sustained Attention: Impaired Sustained Attention Impairment: Verbal basic Memory:  (tro be assessed further) Awareness: Impaired Awareness Impairment: Intellectual impairment;Emergent impairment Problem Solving:  (will assess further, suspect decreased) Behaviors: Perseveration Safety/Judgment: Impaired       Comprehension  Auditory Comprehension Overall Auditory Comprehension: Impaired Commands: Impaired One Step Basic Commands: 50-74% accurate (50%) Interfering Components: Attention;Processing speed Visual Recognition/Discrimination Discrimination: Not tested Reading Comprehension Reading Status: Not tested    Expression Expression Primary Mode of Expression: Verbal Verbal Expression Overall Verbal Expression: Impaired Initiation: No impairment Level of Generative/Spontaneous Verbalization: Phrase Repetition:  (repeated word x 1) Naming: Impairment Confrontation: Impaired (20%) Pragmatics: Impairment Impairments: Eye contact Interfering Components: Attention Written Expression Written Expression: Not tested   Oral / Motor  Oral Motor/Sensory Function Overall Oral Motor/Sensory Function: Within functional limits Motor Speech Overall Motor Speech: Impaired Respiration: Within functional limits Phonation: Low vocal intensity Resonance: Within functional limits Articulation: Impaired Level of Impairment: Sentence Intelligibility: Intelligibility reduced Word: 50-74% accurate Phrase: 50-74% accurate Motor Planning: Within functional limits Motor Speech Errors: Not applicable            [name] 12/10/2023, 9:31 AM

## 2023-12-10 NOTE — Progress Notes (Signed)
 Plan of care is reviewed. Pt is oriented x 2, to self and place, unable to follow simple commands, stable hemodynamically, afebrile, normal respiratory effort. He is able to sleep well with no complaints. We cannot complete NIHSS q 4 hrs per documented below. We will continue to monitor.   12/10/23 0354  Glasgow Coma Scale  Eye Opening 4  Best Verbal Response (NON-intubated) 4  Best Motor Response 6  Glasgow Coma Scale Score 14  NIH Stroke Scale   Dizziness Present No  Headache Present No  Interval Shift assessment  Level of Consciousness (1a.)    0  LOC Questions (1b. )    1  LOC Commands (1c. )    2  Best Gaze (2. )    (UTA, Pt is unable to follow commands.)  Visual (3. )    (UTA, Pt is unable to follow commands.)  Facial Palsy (4. )     0  Motor Arm, Left (5a. )    1  Motor Arm, Right (5b. )  3  Motor Leg, Left (6a. )   3  Motor Leg, Right (6b. )  3  Limb Ataxia (7. )  (UTA, Pt is unable to follow commands.)  Sensory (8. )   0  Best Language (9. )   1  Dysarthria (10. ) 1  Extinction/Inattention (11.)    1  NuDESC - Delirium Risk Factor Assessment (Complete for non-ICU patients)  Delirium Risk Factor Assessment Age greater than or equal to 65 years;Impaired cognitive function;Severe illness / infection  NuDESC - Nursing Delirium Screening Scale (Complete for non-ICU patients)  Disorientation 2  Inappropriate Behavior 0  Inappropriate Communications 2  Illusions/hallucinations 0  Psychomotor Retardation 2   Wendi Dash, RN.

## 2023-12-10 NOTE — Care Management Important Message (Signed)
 Important Message  Patient Details  Name: Marco [name] MRN: [id number] Date of Birth: [birth year]   Important Message Given:  Yes - Medicare IM     Claretta Deed 12/10/2023, 4:13 PM

## 2023-12-10 NOTE — Progress Notes (Signed)
 Speech Language Pathology Treatment: Dysphagia  Patient Details Name: DICKSON KOSTELNIK MRN: [id number] DOB: May 02, 1934 Today's Date: 12/10/2023 Time: 9182-9166 SLP Time Calculation (min) (ACUTE ONLY): 16 min  Assessment / Plan / Recommendation Clinical Impression  Pt awake but required increased processing time, less responsive, perseverative. SLP cleaned dried what appeared to be crushed meds from tongue. Dentures donned using Fixodent and he took trials of thin water via cup. There was significant oral holding up to 3 minutes. He was able to swallow and clear oral cavity 1-2 times but despite max cues he wasn't able to transit water which pooled in anterior oral cavity and eventually suctioned. Did not attempt solids (purees this session). Am hopeful that this won't be a consistent pattern and he will be able to transit and consistently initiate swallow. ST will follow and he may need alternate means if oral holding continues.    HPI HPI: Trelon Z Bates is a 89 y.o. 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      SLP Plan  Continue with current plan of care          Recommendations  Diet recommendations: Thin liquid;Other(comment) (full liquids) Liquids provided via: Cup;No straw Medication Administration: Crushed with puree Supervision: Full supervision/cueing for compensatory strategies Compensations: Slow rate;Small sips/bites Postural Changes and/or Swallow Maneuvers: Seated upright 90 degrees                   Oral care BID   Frequent or constant Supervision/Assistance Dysphagia, unspecified (R13.10)     Continue with current plan of care     [name]  12/10/2023, 9:01 AM

## 2023-12-10 NOTE — Progress Notes (Signed)
 PHARMACY - ANTICOAGULATION CONSULT NOTE  Pharmacy Consult for heparin  Indication: acute DVT  Allergies  Allergen Reactions   Beef-Derived Drug Products Other (See Comments)    Vegetarian   Chicken Allergy Other (See Comments)    Vegetarian   Fish Allergy Other (See Comments)    Vegetarian    Pork-Derived Products Other (See Comments)    Vegetarian     Patient Measurements: Height: 5' 8 (172.7 cm) Weight: 57.7 kg (127 lb 3.3 oz) IBW/kg (Calculated) : 68.4 HEPARIN  DW (KG): 60  Vital Signs: Temp: 98.3 F (36.8 C) (08/07 1108) Temp Source: Oral (08/07 1108) BP: 112/56 (08/07 1108) Pulse Rate: 104 (08/07 1108)  Labs: Recent Labs    12/08/23 1520 12/08/23 1557 12/09/23 0449 12/09/23 2122 12/10/23 0839  HGB 10.4* 11.6* 10.1*  --  10.4*  HCT 32.5* 34.0* 31.6*  --  33.3*  PLT 150  --  145*  --  192  LABPROT 17.9*  --   --   --   --   INR 1.4*  --   --   --   --   HEPARINUNFRC  --   --   --  0.67 0.90*  CREATININE 2.93* 2.90* 2.86*  --  2.81*    Estimated Creatinine Clearance: 14.8 mL/min (A) (by C-G formula based on SCr of 2.81 mg/dL (H)).   Assessment:  89 y.o. male with medical history significant for prostate cancer with metastases who is admitted for multiple falls found to have a stroke. US  doppler confirmed acute deep vein thrombosis involving the left femoral vein, and left peroneal veins. Patient not on anticoagulation PTA. Pharmacy consulted to initiate IV heparin . No bolus due to recent stroke.   Initially started on DAPT per neurology, ok to stop with therapeutic anticoagulation being initiated. CBC wnl, platelets low stable 150 > 145.   Heparin  level supratherapeutic (0.67) on infusion at 1000 units/hr. No bleeding noted. Repeat heparin  level still elevated above goal. Will hold heparin  infusion for 2 hours and resume at lower rate.  Goal of Therapy:  Heparin  level 0.3-0.5 units/ml Monitor platelets by anticoagulation protocol: Yes   Plan:   Hold  heparin  infusion x2 hours Resume heparin  infusion at 700 units/hr Check heparin  level in 8 hours and daily while on heparin  Continue to monitor H&H and platelets  Thank you for allowing pharmacy to be a part of this patient's care.  Shelba Collier, PharmD, BCPS Clinical Pharmacist

## 2023-12-10 NOTE — Plan of Care (Signed)

## 2023-12-10 NOTE — Progress Notes (Signed)
  Progress Note   Patient: Marco [name] FMW:[id number] DOB: 05-28-1933 DOA: 12/08/2023     2 DOS: the patient was seen and examined on 12/10/2023        Brief hospital course: 89 y.o. M with metastatic prostate CA, DM, HTN, CKD IIIb baseline 1.3-1.5 recent hip fracture last May, hypothyroidism, and prior amaurosis fugax who presented with acute on chronic weakness and falls.  In the ER, found to have AKI and stroke.     Assessment and Plan: Acute renal failure on CKD stage IIIb Acute metabolic acidosis Mild improvement in creatinine overnight.  No urine output documented, nursing uncertain if they forgot or he had none.  Urinalysis without sediment, renal ultrasound normal.  Suspect this is some ATN due to poor p.o. intake/NSAIDs. - Continue IV fluids - Strict ins and outs - Avoid nephrotoxins and hypotension    Acute stroke, resolving -Continue heparin  - Continue Lipitor  Acute metabolic encephalopathy At baseline, the patient has no significant dementia or memory loss.  Here in the hospital he is at times confused, his answers are at times disoriented.  Suspect this is delirium in the setting of AKI, recent stroke, vision loss - Delirium precautions  Lower extremity DVT -Continue heparin  drip  Metastatic prostate cancer Pathologic compression fracture thoracic spine  Diabetes - Continue SS corrections - Hold home metformin   Hypertension BP normal - Permissive HTN  Hypothyroidism - Continue levothyroxine   Normocytic anemia        Subjective: Patient did poorly with speech therapy this morning.  He was found to have a clot yesterday and started on heparin .  He has had no fever, respiratory distress.  He would like to eat something.     Physical Exam: BP 130/76 (BP Location: Right Arm)   Pulse 90   Temp 99.2 F (37.3 C) (Oral)   Resp 20   Ht 5' 8 (1.727 m)   Wt 57.7 kg   SpO2 100%   BMI 19.34 kg/m   Thin elderly male, sitting up in bed,  interactive but tired Heart rate high normal, no murmurs, no peripheral edema Respiratory rate seems normal, lung sounds diminished, no rales or wheezes appreciated Abdomen soft, no tenderness to palpation, no guarding Generalized weakness, does not make eye contact, oriented to self and called hospital, but seems confused and disoriented conversation   Data Reviewed: Basic metabolic panel shows slightly worsening metabolic acidosis, slightly improving creatinine CBC shows persistent anemia, no leukocytosis     Family Communication: Called to son and daughter-in-law, no answer    Disposition: Status is: Inpatient         Author: Lonni SHAUNNA Dalton, MD 12/10/2023 11:08 AM  For on call review [email or web address].

## 2023-12-10 NOTE — Progress Notes (Signed)
 STROKE TEAM PROGRESS NOTE   SUBJECTIVE (INTERVAL HISTORY) His daughter and son are at the bedside.  Patient lying in bed, lethargic and drowsy sleepy, but arousable. Not quite follow commands or cooperative with exam. Mild pain on passive movement of LUE. Had TCD bubble study which is negative for PFO. On heparin  IV. Renal function has not improved.    OBJECTIVE Temp:  [97.7 F (36.5 C)-99.2 F (37.3 C)] 98.6 F (37 C) (08/07 1602) Pulse Rate:  [90-105] 105 (08/07 1602) Cardiac Rhythm: Normal sinus rhythm (08/07 0700) Resp:  [17-20] 19 (08/07 1602) BP: (112-147)/(37-88) 147/88 (08/07 1602) SpO2:  [100 %] 100 % (08/07 1602)  Recent Labs  Lab 12/09/23 1609 12/09/23 2112 12/10/23 0627 12/10/23 1108 12/10/23 1603  GLUCAP 107* 128* 135* 179* 150*   Recent Labs  Lab 12/08/23 1520 12/08/23 1557 12/09/23 0449 12/10/23 0839  NA 138 137 139 138  K 5.3* 5.3* 5.1 4.9  CL 108 111 110 111  CO2 17*  --  16* 15*  GLUCOSE 125* 121* 106* 126*  BUN 35* 33* 36* 39*  CREATININE 2.93* 2.90* 2.86* 2.81*  CALCIUM  8.5*  --  8.3* 8.0*   Recent Labs  Lab 12/08/23 1520 12/10/23 0839  AST 33 32  ALT 20 17  ALKPHOS 137* 130*  BILITOT 1.2 1.1  PROT 6.7 6.3*  ALBUMIN  3.2* 2.8*   Recent Labs  Lab 12/08/23 1520 12/08/23 1557 12/09/23 0449 12/10/23 0839  WBC 6.5  --  5.7 6.3  NEUTROABS 5.3  --   --   --   HGB 10.4* 11.6* 10.1* 10.4*  HCT 32.5* 34.0* 31.6* 33.3*  MCV 89.3  --  89.0 89.8  PLT 150  --  145* 192   No results for input(s): CKTOTAL, CKMB, CKMBINDEX, TROPONINI in the last 168 hours. Recent Labs    12/08/23 1520  LABPROT 17.9*  INR 1.4*   Recent Labs    12/09/23 0421  COLORURINE YELLOW  LABSPEC 1.012  PHURINE 5.0  GLUCOSEU NEGATIVE  HGBUR SMALL*  BILIRUBINUR NEGATIVE  KETONESUR 5*  PROTEINUR 100*  NITRITE NEGATIVE  LEUKOCYTESUR NEGATIVE       Component Value Date/Time   CHOL 138 12/08/2023 2140   TRIG 78 12/08/2023 2140   HDL 64 12/08/2023 2140    CHOLHDL 2.2 12/08/2023 2140   VLDL 16 12/08/2023 2140   LDLCALC 58 12/08/2023 2140   Lab Results  Component Value Date   HGBA1C 5.9 (H) 12/08/2023   No results found for: LABOPIA, COCAINSCRNUR, LABBENZ, AMPHETMU, THCU, LABBARB  No results for input(s): ETH in the last 168 hours.  I have personally reviewed the radiological images below and agree with the radiology interpretations.  VAS US  TRANSCRANIAL DOPPLER W BUBBLES Result Date: 12/10/2023  Transcranial Doppler with Bubble Patient Name:  MARCELLAS MARCHANT  Date of Exam:   12/10/2023 Medical Rec #: [id number]          Accession #:    [id number] Date of Birth: 06/21/1933           Patient Gender: M Patient Age:   89 years Exam Location:  [facility] Procedure:      VAS US  TRANSCRANIAL DOPPLER W BUBBLES Referring Phys: ARY Deah Ottaway --------------------------------------------------------------------------------  Indications: Embolic stroke, positive for DVT, advanced metastatic prostate cancer. Limitations: Patient movement Comparison Study: No prior study Performing Technologist: Alberta Lis RVS  Examination Guidelines: A complete evaluation includes B-mode imaging, spectral Doppler, color Doppler, and power Doppler as needed of all accessible portions  of each vessel. Bilateral testing is considered an integral part of a complete examination. Limited examinations for reoccurring indications may be performed as noted.  Summary: No HITS at rest or during Valsalva. Negative transcranial Doppler Bubble study with no evidence of right to left intracardiac communication.  A vascular evaluation was performed. The right middle cerebral artery was studied. An IV was inserted into the patient's left AC. Verbal informed consent was obtained.    Preliminary    VAS US  LOWER EXTREMITY VENOUS (DVT) Result Date: 12/09/2023  Lower Venous DVT Study Patient Name:  SATORU MILICH  Date of Exam:   12/09/2023 Medical Rec #: [id number]           Accession #:    [id number] Date of Birth: 03-21-1934           Patient Gender: M Patient Age:   34 years Exam Location:  [facility] Procedure:      VAS US  LOWER EXTREMITY VENOUS (DVT) Referring Phys: ARY Omaree Fuqua --------------------------------------------------------------------------------  Indications: Embolic stroke.  Risk Factors: Prostate cancer, bony metastases. Comparison Study: 02-11-21 - negative Performing Technologist: Ricka Sturdivant-Jones RDMS, RVT  Examination Guidelines: A complete evaluation includes B-mode imaging, spectral Doppler, color Doppler, and power Doppler as needed of all accessible portions of each vessel. Bilateral testing is considered an integral part of a complete examination. Limited examinations for reoccurring indications may be performed as noted. The reflux portion of the exam is performed with the patient in reverse Trendelenburg.  +---------+---------------+---------+-----------+----------+--------------+ RIGHT    CompressibilityPhasicitySpontaneityPropertiesThrombus Aging +---------+---------------+---------+-----------+----------+--------------+ CFV      Full           Yes      Yes                                 +---------+---------------+---------+-----------+----------+--------------+ SFJ      Full                                                        +---------+---------------+---------+-----------+----------+--------------+ FV Prox  Full                                                        +---------+---------------+---------+-----------+----------+--------------+ FV Mid   Full                                                        +---------+---------------+---------+-----------+----------+--------------+ FV DistalFull                                                        +---------+---------------+---------+-----------+----------+--------------+ PFV      Full                                                         +---------+---------------+---------+-----------+----------+--------------+  POP      Full           Yes      Yes                                 +---------+---------------+---------+-----------+----------+--------------+ PTV      Full                                                        +---------+---------------+---------+-----------+----------+--------------+ PERO     Full                                                        +---------+---------------+---------+-----------+----------+--------------+   +---------+---------------+---------+-----------+----------+--------------+ LEFT     CompressibilityPhasicitySpontaneityPropertiesThrombus Aging +---------+---------------+---------+-----------+----------+--------------+ CFV      Full           Yes      Yes                                 +---------+---------------+---------+-----------+----------+--------------+ SFJ      Full                                                        +---------+---------------+---------+-----------+----------+--------------+ FV Prox  Full                                                        +---------+---------------+---------+-----------+----------+--------------+ FV Mid   Partial        Yes      Yes                  Acute          +---------+---------------+---------+-----------+----------+--------------+ FV DistalPartial        No       No                   Acute          +---------+---------------+---------+-----------+----------+--------------+ PFV      Full                                                        +---------+---------------+---------+-----------+----------+--------------+ POP      Full                                                        +---------+---------------+---------+-----------+----------+--------------+ PTV      Full                                                         +---------+---------------+---------+-----------+----------+--------------+  PERO     None                                         Acute          +---------+---------------+---------+-----------+----------+--------------+     Summary: RIGHT: - There is no evidence of deep vein thrombosis in the lower extremity.  - No cystic structure found in the popliteal fossa.  LEFT: - Findings consistent with acute deep vein thrombosis involving the left femoral vein, and left peroneal veins.  - No cystic structure found in the popliteal fossa.  *See table(s) above for measurements and observations. Electronically signed by Gaile New MD on 12/09/2023 at 6:11:30 PM.    Final    ECHOCARDIOGRAM COMPLETE Result Date: 12/09/2023    ECHOCARDIOGRAM REPORT   Patient Name:   BURL TAUZIN Date of Exam: 12/09/2023 Medical Rec #:  [id number]         Height:       68.0 in Accession #:    [id number]        Weight:       127.2 lb Date of Birth:  December 24, 1933          BSA:          1.686 m Patient Age:    89 years          BP:           124/67 mmHg Patient Gender: M                 HR:           99 bpm. Exam Location:  Inpatient Procedure: 2D Echo, Cardiac Doppler and Color Doppler (Both Spectral and Color            Flow Doppler were utilized during procedure). Indications:    Stroke  History:        Patient has prior history of Echocardiogram examinations, most                 recent 02/12/2022. CAD, CKD; Risk Factors:Dyslipidemia.  Sonographer:    Therisa Crouch Referring Phys: [id number] Salome Hautala IMPRESSIONS  1. Left ventricular ejection fraction, by estimation, is 65 to 70%. The left ventricle has normal function. The left ventricle has no regional wall motion abnormalities. There is moderate asymmetric left ventricular hypertrophy of the basal-septal segment. Left ventricular diastolic parameters are indeterminate.  2. Right ventricular systolic function is normal. The right ventricular size is normal. There is mildly elevated  pulmonary artery systolic pressure. The estimated right ventricular systolic pressure is 36.3 mmHg.  3. Left atrial size was mildly dilated.  4. The mitral valve is grossly normal. Trivial mitral valve regurgitation. No evidence of mitral stenosis.  5. The aortic valve is grossly normal. Aortic valve regurgitation is mild. No aortic stenosis is present.  6. The inferior vena cava is dilated in size with <50% respiratory variability, suggesting right atrial pressure of 15 mmHg. Conclusion(s)/Recommendation(s): No intracardiac source of embolism detected on this transthoracic study. Consider a transesophageal echocardiogram to exclude cardiac source of embolism if clinically indicated. FINDINGS  Left Ventricle: Left ventricular ejection fraction, by estimation, is 65 to 70%. The left ventricle has normal function. The left ventricle has no regional wall motion abnormalities. The left ventricular internal cavity size was normal in size. There is  moderate asymmetric left ventricular hypertrophy of the basal-septal segment.  Left ventricular diastolic parameters are indeterminate. Right Ventricle: The right ventricular size is normal. No increase in right ventricular wall thickness. Right ventricular systolic function is normal. There is mildly elevated pulmonary artery systolic pressure. The tricuspid regurgitant velocity is 2.31  m/s, and with an assumed right atrial pressure of 15 mmHg, the estimated right ventricular systolic pressure is 36.3 mmHg. Left Atrium: Left atrial size was mildly dilated. Right Atrium: Right atrial size was normal in size. Pericardium: There is no evidence of pericardial effusion. Mitral Valve: The mitral valve is grossly normal. Trivial mitral valve regurgitation. No evidence of mitral valve stenosis. Tricuspid Valve: The tricuspid valve is normal in structure. Tricuspid valve regurgitation is trivial. No evidence of tricuspid stenosis. Aortic Valve: The aortic valve is grossly normal.  Aortic valve regurgitation is mild. Aortic regurgitation PHT measures 376 msec. No aortic stenosis is present. Aortic valve mean gradient measures 4.0 mmHg. Aortic valve peak gradient measures 8.0 mmHg. Pulmonic Valve: The pulmonic valve was normal in structure. Pulmonic valve regurgitation is trivial. No evidence of pulmonic stenosis. Aorta: The aortic root is normal in size and structure. Venous: The inferior vena cava is dilated in size with less than 50% respiratory variability, suggesting right atrial pressure of 15 mmHg. IAS/Shunts: No atrial level shunt detected by color flow Doppler.  LEFT VENTRICLE PLAX 2D LVIDd:         3.40 cm LVIDs:         2.50 cm LV PW:         0.80 cm LV IVS:        1.40 cm  LV Volumes (MOD) LV vol d, MOD A2C: 35.0 ml LV vol d, MOD A4C: 46.5 ml LV vol s, MOD A2C: 9.6 ml LV vol s, MOD A4C: 12.7 ml LV SV MOD A2C:     25.5 ml LV SV MOD A4C:     46.5 ml LV SV MOD BP:      29.3 ml RIGHT VENTRICLE          IVC RV Basal diam:  2.70 cm  IVC diam: 2.40 cm TAPSE (M-mode): 2.9 cm LEFT ATRIUM             Index LA diam:        2.40 cm 1.42 cm/m LA Vol (A2C):   29.3 ml 17.38 ml/m LA Vol (A4C):   47.2 ml 27.99 ml/m LA Biplane Vol: 37.1 ml 22.00 ml/m  AORTIC VALVE AV Vmax:           141.00 cm/s AV Vmean:          94.100 cm/s AV VTI:            0.227 m AV Peak Grad:      8.0 mmHg AV Mean Grad:      4.0 mmHg LVOT Vmax:         85.40 cm/s LVOT Vmean:        57.500 cm/s LVOT VTI:          0.134 m LVOT/AV VTI ratio: 0.59 AI PHT:            376 msec AR Vena Contracta: 0.20 cm  AORTA Ao Root diam: 3.40 cm Ao Asc diam:  3.50 cm TRICUSPID VALVE TR Peak grad:   21.3 mmHg TR Vmax:        231.00 cm/s  SHUNTS Systemic VTI: 0.13 m Soyla Merck MD Electronically signed by Soyla Merck MD Signature Date/Time: 12/09/2023/1:44:24 PM    Final  MR Brain W and Wo Contrast Addendum Date: 12/08/2023 ADDENDUM REPORT: 12/08/2023 21:34 ADDENDUM: Findings discussed with the on call provider at 9:17 p.m.  Electronically Signed   By: Gilmore GORMAN Molt M.D.   On: 12/08/2023 21:34   Result Date: 12/08/2023 CLINICAL DATA:  Stroke, follow up Stroke, also hx of cancer with met; Stroke/TIA, determine embolic source EXAM: MRI HEAD WITHOUT AND WITH CONTRAST MRA HEAD WITHOUT CONTRAST MRA NECK WITHOUT AND WITH CONTRAST TECHNIQUE: Multiplanar, multi-echo pulse sequences of the brain and surrounding structures were acquired without and with intravenous contrast. Angiographic images of the Circle of Willis were acquired using MRA technique without intravenous contrast. Angiographic images of the neck were acquired using MRA technique without and with intravenous contrast. Carotid stenosis measurements (when applicable) are obtained utilizing NASCET criteria, using the distal internal carotid diameter as the denominator. CONTRAST:  5mL GADAVIST  GADOBUTROL  1 MMOL/ML IV SOLN COMPARISON:  CT head from earlier today. FINDINGS: MRI HEAD FINDINGS Brain: Acute left PCA territory infarct involving the left occipital lobe, hippocampus and thalamus. Additional punctate acute infarct in the right cerebellum. Edema without substantial mass effect. No midline shift. Small amount of petechial hemorrhage associated with the left occipital infarct. No evidence of mass occupying acute hemorrhage, mass lesion or hydrocephalus. No pathologic enhancement. Vascular: See below. Skull and upper cervical spine: Normal marrow signal. Sinuses/Orbits: Clear sinuses.  No acute orbital findings. MRA HEAD FINDINGS Anterior circulation: Bilateral intracranial ICAs, MCAs, and ACAs are patent without proximal hemodynamically significant stenosis. Posterior circulation: Bilateral intradural vertebral arteries, basilar artery and right PCA are patent without proximal him at least significant stenosis. Left proximal to mid P2 PCA occlusion. MRA NECK FINDINGS Aortic arch: Great vessel origins are patent. Motion limited assessment. Right carotid system: On motion  limited evaluation the common carotid internal carotid arteries appear patent without significant stenosis. Left carotid system: On motion limited evaluation the common carotid internal carotid arteries appear patent without significant stenosis. Vertebral arteries: Limited evaluation due to motion and contrast timing. The vertebral arteries appear patent bilaterally without visible high-grade stenosis. IMPRESSION: 1. 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 2. Left proximal to mid P2 PCA occlusion. Electronically Signed: By: Gilmore GORMAN Molt M.D. On: 12/08/2023 20:45   MR ANGIO HEAD WO CONTRAST Addendum Date: 12/08/2023 ADDENDUM REPORT: 12/08/2023 21:34 ADDENDUM: Findings discussed with the on call provider at 9:17 p.m. Electronically Signed   By: Gilmore GORMAN Molt M.D.   On: 12/08/2023 21:34   Result Date: 12/08/2023 CLINICAL DATA:  Stroke, follow up Stroke, also hx of cancer with met; Stroke/TIA, determine embolic source EXAM: MRI HEAD WITHOUT AND WITH CONTRAST MRA HEAD WITHOUT CONTRAST MRA NECK WITHOUT AND WITH CONTRAST TECHNIQUE: Multiplanar, multi-echo pulse sequences of the brain and surrounding structures were acquired without and with intravenous contrast. Angiographic images of the Circle of Willis were acquired using MRA technique without intravenous contrast. Angiographic images of the neck were acquired using MRA technique without and with intravenous contrast. Carotid stenosis measurements (when applicable) are obtained utilizing NASCET criteria, using the distal internal carotid diameter as the denominator. CONTRAST:  5mL GADAVIST  GADOBUTROL  1 MMOL/ML IV SOLN COMPARISON:  CT head from earlier today. FINDINGS: MRI HEAD FINDINGS Brain: Acute left PCA territory infarct involving the left occipital lobe, hippocampus and thalamus. Additional punctate acute infarct in the right cerebellum. Edema without substantial mass effect. No midline shift. Small amount of petechial  hemorrhage associated with the left occipital infarct. No evidence of mass occupying acute hemorrhage, mass lesion  or hydrocephalus. No pathologic enhancement. Vascular: See below. Skull and upper cervical spine: Normal marrow signal. Sinuses/Orbits: Clear sinuses.  No acute orbital findings. MRA HEAD FINDINGS Anterior circulation: Bilateral intracranial ICAs, MCAs, and ACAs are patent without proximal hemodynamically significant stenosis. Posterior circulation: Bilateral intradural vertebral arteries, basilar artery and right PCA are patent without proximal him at least significant stenosis. Left proximal to mid P2 PCA occlusion. MRA NECK FINDINGS Aortic arch: Great vessel origins are patent. Motion limited assessment. Right carotid system: On motion limited evaluation the common carotid internal carotid arteries appear patent without significant stenosis. Left carotid system: On motion limited evaluation the common carotid internal carotid arteries appear patent without significant stenosis. Vertebral arteries: Limited evaluation due to motion and contrast timing. The vertebral arteries appear patent bilaterally without visible high-grade stenosis. IMPRESSION: 1. 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 2. Left proximal to mid P2 PCA occlusion. Electronically Signed: By: Gilmore GORMAN Molt M.D. On: 12/08/2023 20:45   MR Angiogram Neck W or Wo Contrast Result Date: 12/08/2023 CLINICAL DATA:  Provided history: Stroke/TIA, determine embolic source. EXAM: MRA NECK WITHOUT AND WITH CONTRAST TECHNIQUE: Multiplanar and multiecho pulse sequences of the neck were obtained without and with intravenous contrast. Angiographic images of the neck were obtained using MRA technique without and with intravenous contrast. CONTRAST:  5mL GADAVIST  GADOBUTROL  1 MMOL/ML IV SOLN COMPARISON:  Same-day brain MRI 12/08/2023. Same day MRA head 12/08/2023. FINDINGS: Aortic arch: Standard aortic branching. No  hemodynamically significant innominate or proximal subclavian artery stenosis. Right carotid system: CCA and ICA patent within the neck without stenosis. Tortuosity of the cervical ICA. Left carotid system: CCA and ICA patent within the neck without stenosis. Tortuosity of the cervical ICA. Vertebral arteries: Patent within the neck without stenosis. The left vertebral artery is dominant. IMPRESSION: 1. The common carotid, internal carotid and vertebral arteries are patent within the neck without stenosis. 2. MRA head findings separately reported. Electronically Signed   By: Rockey Childs D.O.   On: 12/08/2023 20:42   CT Lumbar Spine Wo Contrast Result Date: 12/08/2023 CLINICAL DATA:  Fall with back pain EXAM: CT LUMBAR SPINE WITHOUT CONTRAST TECHNIQUE: Multidetector CT imaging of the lumbar spine was performed without intravenous contrast administration. Multiplanar CT image reconstructions were also generated. RADIATION DOSE REDUCTION: This exam was performed according to the departmental dose-optimization program which includes automated exposure control, adjustment of the mA and/or kV according to patient size and/or use of iterative reconstruction technique. COMPARISON:  Radiographs 12/08/2023, 11/18/2023, chest CT 02/12/2022 FINDINGS: Segmentation: Based on chest CT from October 2023, hypoplastic ribs are present at T12. Transitional anatomy, for the purposes of reporting, transitional vertebra will be designated sacralized L5 and the first non rib-bearing lumbar type vertebra will be designated L1, careful attention to lumbar numbering recommended if there is any planned intervention. Alignment: Normal. Vertebrae: Multiple sclerotic and lucent lesions throughout the spine corresponding to history of widespread skeletal metastatic disease. Comminuted fracture involving the L1 vertebral body, involves the anterior and posterior vertebral body, including the superior endplate. Fracture through the posterior  cortex of the vertebral body. The fracture also involves the bilateral pedicles. Fracture is present on radiographs from July 16, new finding since June 2024. Diffuse height loss of the vertebral body compared to prior CT images, estimated at 25%. Minimal 2 mm retropulsion of right posterior vertebral fracture fragment, no significant canal stenosis. No other discrete fractures are visualized. Paraspinal and other soft tissues: Paravertebral soft tissue swelling at L1.  Disc levels: At L1-L2, disc space narrowing. Central, left foraminal and extraforaminal disc bulging. No high-grade canal stenosis. Facet degenerative changes. At L2-L3, disc space narrowing. Posterior disc osteophyte complex with diffuse disc bulge. Ligamentum flavum thickening and hypertrophic facet degenerative changes. Moderate severe canal stenosis. At L3-L4, disc space narrowing. Disc bulge, ligamentum flavum thickening and hypertrophic facet degeneration results in moderate severe canal stenosis. At L4-L5, disc space narrowing. Posterior disc osteophyte complex, ligamentum flavum thickening and hypertrophic facet degenerative changes results in severe canal stenosis. There is moderate bilateral foraminal narrowing. Rudimentary disc at transitional L5 and S1 segment. No canal stenosis. IMPRESSION: 1. Suspected transition anatomy as discussed above. 2. Comminuted likely pathologic fracture involving the L1 vertebral body, involving the anterior and posterior vertebral body, including the superior endplate. The fracture also involves the bilateral pedicles. Diffuse height loss of the vertebral body compared to prior CT images, estimated at 25%. Minimal 2 mm retropulsion of right posterior vertebral fracture fragment, no significant canal stenosis. Fracture is likely subacute as it was present on radiograph from July. 3. Multiple sclerotic and lucent lesions throughout the spine corresponding to history of widespread skeletal metastatic disease. 4.  Multilevel degenerative changes with multilevel moderate severe canal stenosis due to combination of disc disease, ligamentum flavum thickening and hypertrophic facet degeneration Electronically Signed   By: Luke Bun M.D.   On: 12/08/2023 20:34   US  RENAL Result Date: 12/08/2023 CLINICAL DATA:  Acute kidney injury EXAM: RENAL / URINARY TRACT ULTRASOUND COMPLETE COMPARISON:  CT 10/14/2022 FINDINGS: Right Kidney: Renal measurements: 5.7 x 2.4 x 2.8 cm = volume: 20.7 mL. Cortex is echogenic. No hydronephrosis. Upper pole cyst measuring 26 mm, no imaging follow-up is recommended. Left Kidney: Renal measurements: 10.6 x 5.2 x 4.1 cm = volume: 115.6 mL. Cortex appears slightly echogenic. No hydronephrosis. Interpolar cyst measuring 35 mm, no imaging follow-up is recommended. Bladder: Appears normal for degree of bladder distention. Other: None. IMPRESSION: 1. Echogenic kidneys bilaterally consistent with medical renal disease, no hydronephrosis. There is atrophy of the right kidney Electronically Signed   By: Luke Bun M.D.   On: 12/08/2023 20:10   CT PELVIS WO CONTRAST Result Date: 12/08/2023 CLINICAL DATA:  Hip trauma EXAM: CT PELVIS WITHOUT CONTRAST TECHNIQUE: Multidetector CT imaging of the pelvis was performed following the standard protocol without intravenous contrast. RADIATION DOSE REDUCTION: This exam was performed according to the departmental dose-optimization program which includes automated exposure control, adjustment of the mA and/or kV according to patient size and/or use of iterative reconstruction technique. COMPARISON:  12/08/2023, 11/26/2023 PET-CT, hip radiograph 09/27/2023, CT 10/14/2022 FINDINGS: Urinary Tract:  No abnormality visualized. Bowel: Moderate retained feces at the rectum. No acute bowel wall thickening. Vascular/Lymphatic: Aortic atherosclerosis. Tortuous ectatic common iliac arteries. No suspicious lymph nodes Reproductive:  Enlarged prostate Other:  Negative for pelvic  effusion or free air. Musculoskeletal: Diffuse heterogeneous mineralization of the pelvis and femurs with faintly visible sclerosis and areas of subtle lucency, corresponding to history of widespread skeletal metastatic disease on recent PET CT. Status post intramedullary rodding of the right femur with intact hardware and no fracture. Suspicion of lucent femoral bone lesion with cortical erosion at the posterior lower trochanter, series 4, image 109. Status post intramedullary rodding of left femur across comminuted intertrochanteric fracture. There is mild sclerosis and periosteal new bone formation at the fracture margins consistent with more remote injury. No definite acute displaced fracture is seen. Markedly displaced lesser trochanteric fracture fragment. IMPRESSION: 1. Status post intramedullary rodding of  left femur across comminuted intertrochanteric fracture. There is mild sclerosis and periosteal new bone formation at the fracture margins consistent with more remote injury. No definite acute displaced fracture is seen. 2. Status post intramedullary rodding of the right femur with intact hardware and no fracture. Suspicion of lucent femoral bone lesion with cortical erosion at the posterior lower trochanter. 3. Diffuse heterogeneous mineralization of the pelvis and femurs with faintly visible sclerosis and areas of subtle lucency, corresponding to history of widespread skeletal metastatic disease on recent PET CT. 4. Aortic atherosclerosis. Aortic Atherosclerosis (ICD10-I70.0). Electronically Signed   By: Luke Bun M.D.   On: 12/08/2023 20:06   DG Chest 1 View Result Date: 12/08/2023 CLINICAL DATA:  [id number] Sepsis [facility]) [id number] EXAM: CHEST  1 VIEW COMPARISON:  Chest x-ray 09/26/2023 FINDINGS: The heart and mediastinal contours are within normal limits. Atherosclerotic plaque. Low lung volumes. No focal consolidation. No pulmonary edema. No pleural effusion. No pneumothorax. No acute osseous  abnormality. IMPRESSION: 1. No active disease. 2.  Aortic Atherosclerosis (ICD10-I70.0). Electronically Signed   By: Morgane  Naveau M.D.   On: 12/08/2023 14:35   DG Lumbar Spine Complete Result Date: 12/08/2023 CLINICAL DATA:  fall EXAM: LUMBAR SPINE - COMPLETE 4+ VIEW COMPARISON:  CT abdomen pelvis 10/14/2022 FINDINGS: Limited evaluation due to overlapping osseous structures and overlying soft tissues. Age-indeterminate L2 vertebral body height loss. Multilevel moderate severe degenerative change of the spine. Multilevel osseous neural foraminal stenosis. Alignment is normal. Multilevel intervertebral disc space narrowing. Vascular calcification. IMPRESSION: Age-indeterminate L2 vertebral body height loss. Limited evaluation due to overlapping osseous structures and overlying soft tissues. Recommend cross-sectional imaging for further evaluation. Electronically Signed   By: Morgane  Naveau M.D.   On: 12/08/2023 14:34   DG HIP UNILAT WITH PELVIS 2-3 VIEWS LEFT Result Date: 12/08/2023 CLINICAL DATA:  [id number] Fall [id number] EXAM: DG HIP (WITH OR WITHOUT PELVIS) 2-3V LEFT; DG HIP (WITH OR WITHOUT PELVIS) 2-3V RIGHT COMPARISON:  PET CT 11/26/2023, x-ray left femur 09/27/2023 FINDINGS: There is no evidence of hip fracture or dislocation of the right hip. Redemonstration of partially visualized interlocking intramedullary nail fixation of the right femur. Redemonstration of subacute to chronic healing left femoral inter trochanteric fracture status post interlocking intramedullary nail fixation of the femur. Some periosteal reaction noted along the left intertrochanteric fracture with superimposed acute fracture not excluded. No acute displaced fracture or diastasis of the bones of the pelvis. There is no evidence of arthropathy or other focal bone abnormality. IMPRESSION: 1. Bilateral interlocking intramedullary nail fixation with healing subacute to chronic left femoral intertrochanteric fracture. Some periosteal  reaction noted along the left intertrochanteric fracture with superimposed periprosthetic acute inter trochanteric fracture not excluded. 2. Negative for acute traumatic injury of the right hip or bones of the pelvis. Electronically Signed   By: Morgane  Naveau M.D.   On: 12/08/2023 14:32   DG HIP UNILAT WITH PELVIS 2-3 VIEWS RIGHT Result Date: 12/08/2023 CLINICAL DATA:  [id number] Fall [id number] EXAM: DG HIP (WITH OR WITHOUT PELVIS) 2-3V LEFT; DG HIP (WITH OR WITHOUT PELVIS) 2-3V RIGHT COMPARISON:  PET CT 11/26/2023, x-ray left femur 09/27/2023 FINDINGS: There is no evidence of hip fracture or dislocation of the right hip. Redemonstration of partially visualized interlocking intramedullary nail fixation of the right femur. Redemonstration of subacute to chronic healing left femoral inter trochanteric fracture status post interlocking intramedullary nail fixation of the femur. Some periosteal reaction noted along the left intertrochanteric fracture with superimposed acute fracture not excluded. No acute displaced fracture or  diastasis of the bones of the pelvis. There is no evidence of arthropathy or other focal bone abnormality. IMPRESSION: 1. Bilateral interlocking intramedullary nail fixation with healing subacute to chronic left femoral intertrochanteric fracture. Some periosteal reaction noted along the left intertrochanteric fracture with superimposed periprosthetic acute inter trochanteric fracture not excluded. 2. Negative for acute traumatic injury of the right hip or bones of the pelvis. Electronically Signed   By: Morgane  Naveau M.D.   On: 12/08/2023 14:32   CT Head Wo Contrast Result Date: 12/08/2023 EXAM: CT HEAD WITHOUT CONTRAST 12/08/2023 01:03:19 PM TECHNIQUE: CT of the head was performed without the administration of intravenous contrast. Automated exposure control, iterative reconstruction, and/or weight based adjustment of the mA/kV was utilized to reduce the radiation dose to as low as reasonably  achievable. COMPARISON: 02/11/2022 CLINICAL HISTORY: Mental status change, unknown cause. AMS. FINDINGS: BRAIN AND VENTRICLES: Since the previous study, the patient has developed a late subacute infarct within the left occipital lobe. There are also new subacute lacunar infarcts within the left thalamus. There is mild-to-moderate periventricular white matter disease. No acute hemorrhage. Gray-white differentiation is preserved. No hydrocephalus. No extra-axial collection. No mass effect or midline shift. ORBITS: The patient is status post bilateral lens replacement. No acute abnormality. SINUSES: No acute abnormality. SOFT TISSUES AND SKULL: No acute soft tissue abnormality. No skull fracture. VASCULATURE: There is calcification within the cavernous carotid arteries. IMPRESSION: 1. Late subacute infarct within the left occipital lobe and new subacute lacunar infarcts within the left thalamus. 2. Mild-to-moderate periventricular white matter disease. 3. Calcification within the cavernous carotid arteries. 4. Status post bilateral lens replacement. Electronically signed by: evalene coho 12/08/2023 01:37 PM EDT RP Workstation: [id number]   NM PLUVICTO  ADMINISTRATION Result Date: 12/02/2023 CLINICAL DATA:  89 year old male with prostate cancer. 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 EXAM: NUCLEAR MEDICINE PLUVICTO  INJECTION TECHNIQUE: Infusion: The nuclear medicine technologist and I personally verified the dose activity to be delivered as specified in the written directive, and verified the patient identification via 2 separate methods. Initial flush of the intravenous catheter was performed was sterile saline. The dose syringe was connected to the catheter and the Lu-177 Pluvicto  administered over a 1 to 10 min infusion. Single 10 cc lushes with normal saline follow the dose. No complications were noted. The  entire IV tubing, venocatheter, stopcock and syringes was removed in total, placed in a disposal bag and sent for assay of the residual activity, which will be reported at a later time in our EMR by the physics staff. Pressure was applied to the venipuncture site, and a compression bandage placed. Patient monitored for 1 hour following infusion. Radiation Safety personnel were present to perform the discharge survey, as detailed on their documentation. After a short period of observation, the patient had his IV removed. RADIOPHARMACEUTICALS:  211.9 microcuries Lu-177 PLUVICTO  FINDINGS: Current Infusion: 1 Planned Infusions: 6 Patient presented to nuclear medicine for treatment. Patient accompanied by son. Risk and benefits again explained and consent obtained. The patient's most recent blood counts were reviewed and remains a adequate candidate to proceed with Lu-177 Pluvicto . Hemoglobin equal 11.1. Normal white blood cell count. Normal platelet count. Creatinine equal 1.52 PSA equal greater than 1,500.  PSA equal 607 one  month prior. The patient was situated in an infusion suite with a contact barrier placed under the arm. Intravenous access was established, using sterile technique, and a normal saline infusion  from a syringe was started. Micro-dosimetry: The prescribed radiation activity was assayed and confirmed to be within specified tolerance. IMPRESSION: Current Infusion: 1 Planned Infusions: 6 The patient tolerated the infusion well. The patient will return in one month for ongoing care. Electronically Signed   By: Jackquline Boxer M.D.   On: 12/02/2023 15:41   NM Radiologist Eval And Mgmt Result Date: 11/26/2023 EXAM: NEW PATIENT OFFICE VISIT CHIEF COMPLAINT: Metastatic castrate resistant prostate carcinoma Current Pain Level: 1-10 HISTORY OF PRESENT ILLNESS: 89 year old male with castrate resistant metastatic prostate carcinoma. Initial diagnosis in 2022 with Gleason 4+5=9 prostate carcinoma with  metastatic lymph nodes and metastatic bone disease. Patient has undergone androgen deprivation. Evidence of progression on androgen deprivation. Recent PSMA PET scan 11/26/2023 demonstrates marked progression radiotracer avid skeletal metastasis. Additional progression liver metastasis. PSA rapidly increasing and now greater than 1,000. REVIEW OF SYSTEMS: See epic note PHYSICAL EXAMINATION: See epic note ASSESSMENT AND PLAN: See epic note Electronically Signed   By: Jackquline Boxer M.D.   On: 11/26/2023 16:18   NM PET (PSMA) SKULL TO MID THIGH Result Date: 11/26/2023 CLINICAL DATA:  Prostate carcinoma with biochemical recurrence. EXAM: NUCLEAR MEDICINE PET SKULL BASE TO THIGH TECHNIQUE: 8.5 mCi Flotufolastat (Posluma ) was injected intravenously. Full-ring PET imaging was performed from the skull base to thigh after the radiotracer. CT data was obtained and used for attenuation correction and anatomic localization. COMPARISON:  PSA PET scan 430 1,020 FINDINGS: NECK No radiotracer activity in neck lymph nodes. Incidental CT finding: None. CHEST No radiotracer accumulation within mediastinal or hilar lymph nodes. No suspicious pulmonary nodules on the CT scan. Incidental CT finding: None. ABDOMEN/PELVIS Prostate: No abnormal activity in the prostatectomy bed. Lymph nodes: No abnormal radiotracer accumulation within pelvic or abdominal nodes. Liver: There are several new radiotracer avid lymph nodes within the liver. Previously there were 2 or 3 discrete lesions. Now there are approximately 20 small lesions. The most intense lesion is in the lateral segment LEFT hepatic lobe with SUV max equal 50 on image 91. Incidental CT finding: Benign renal cysts. The RIGHT kidney is smaller than the LEFT. Atherosclerotic calcification of the aorta. SKELETON There is significant progression of skeletal metastasis. Previously there are proximally 50 lesions. Now the lesions number approximately 200 lesions . Essentially every  vertebral body is involved. Extensive activity within the LEFT and RIGHT femurs. Multiple rib lesions. Bilateral shoulder girdles involved. Example lesion at L1 with SUV max equal 68.4. Interval internal fixation of the LEFT proximal femur. Prior fixation the RIGHT femur. IMPRESSION: 1. Significant progression of skeletal metastasis. 2. Significant progression of hepatic metastasis. 3. No evidence of local recurrence in the prostatectomy bed. 4.  Aortic Atherosclerosis (ICD10-I70.0). Electronically Signed   By: Jackquline Boxer M.D.   On: 11/26/2023 15:26   XR FEMUR MIN 2 VIEWS LEFT Result Date: 11/18/2023 X-rays of the left femur from 11/18/2023 were independently reviewed and interpreted, showing displaced intertrochanteric femur fracture which appears unchanged in terms of displacement and alignment from prior films on 10/14/2023.  There is a long cephalomedullary rod in place spanning the fracture.  The interlocking screws are without any lucency around them.  There is no lucency seen around the lag screws.  No new fracture seen.  XR Lumbar Spine 2-3 Views Result Date: 11/18/2023 XRs of the lumbar spine from 11/18/2023 were independent reviewed and interpreted, showing disc height loss at L4/5 and L5/S1.  No spondylolisthesis seen.  Compression fracture seen at L2.  No other fracture seen.  No dislocation seen.    PHYSICAL EXAM  Temp:  [97.7 F (36.5 C)-99.2 F (37.3 C)] 98.6 F (37 C) (08/07 1602) Pulse Rate:  [90-105] 105 (08/07 1602) Resp:  [17-20] 19 (08/07 1602) BP: (112-147)/(37-88) 147/88 (08/07 1602) SpO2:  [100 %] 100 % (08/07 1602)  General - Well nourished, well developed, lethargic and drowsy.  Ophthalmologic - fundi not visualized due to noncooperation.  Cardiovascular - Regular rhythm and rate.  Neuro - lethargic and drowsy sleepy, eyes open to voice but not able to maintain eye opening without repetitive stimulation, not orientated to place, not corporative answering  orientation questions.  Intangible words, not quite follow commands.  With eye opening, no gaze palsy, not tracking well, blinking to visual threat on the left but not on the right. No significant facial droop. Tongue protrusion not corporative. Bilateral UEs 2+/5 seems symmetrical. Bilaterally LEs withdraw to pain seems symmetrical. Sensation, coordination and gait not tested.   ASSESSMENT/PLAN Mr. Simeon Vera Frangos is a 89 y.o. male with history of prostate cancer metastasis to bone, diabetes, hypertension, hyperlipidemia admitted for lower extremity weakness and difficulty walking for a week. No TNK given due to outside window.    Stroke:  left large PCA infarct involving left thalamus as well as right punctate cerebellar infarct, embolic likely secondary to hypercoagulable state from advanced malignancy CT left PCA territory infarct including left thalamus. MRI confirmed left PCA large infarct including left thalamus, as well as right cerebellar punctate infarct MRA head and neck left P2 occlusion 2D Echo EF 65 to 70% LE venous Doppler acute DVT involving left femoral vein and left peroneal veins LDL 58 HgbA1c 5.9 Heparin  IV for VTE prophylaxis No antithrombotic prior to admission, now on heparin  IV. Consider transition to DOAC once appropriate.  Ongoing aggressive stroke risk factor management Therapy recommendations: SNF Disposition: Pending  Prostate cancer with metastasis Known prostate cancer with bone metastasis CT L-spine and pelvis showed widespread bone metastasis with pathologic fracture Positive DVT and current stroke On anticoagulation   DVT LE venous Doppler acute DVT involving left femoral vein and left peroneal veins TCD bubble study no PFO On heparin  IV Transition to DOAC once appropriate.   Diabetes HgbA1c 5.9, goal < 7.0 Controlled CBG monitoring SSI DM education and close PCP follow up  Hypertension Stable Long term BP goal  normotensive  Hyperlipidemia Home meds: Lipitor 40 LDL 58, goal < 70 Now on Lipitor 40 Continue statin at discharge  AKI on CKD Baseline creatinine 1.5 This admission creatinine 2.93--2.90--2.86 On IV fluid BMP monitoring  Other Stroke Risk Factors Advanced age  Other Active Problems Hypothyroidism, TSH 10.9, on Synthroid  Anemia of chronic disease  Hospital day # 2  Neurology will sign off. Please call with questions. Pt will follow up with stroke clinic NP at [facility] in about 4 weeks. Thanks for the consult.   Ary Cummins, MD PhD Stroke Neurology 12/10/2023 4:54 PM    To contact Stroke Continuity provider, please refer to [email or web address]. After hours, contact General Neurology

## 2023-12-10 NOTE — Progress Notes (Signed)
 Occupational Therapy Treatment Patient Details Name: Marco [name] MRN: [id number] DOB: 1933/09/03 Today's Date: 12/10/2023   History of present illness Pt is a 89 y.o. male presenting to [facility] after a fall and inability to walk, found to have subacute L1 fx. 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 PMH significant for advanced stage IV prostate cancer with bony mets, Type II DM, L hip fx s/p IM nailing 09/2023, CAD, CKD III   OT comments  Pt remains limited by cognition, sometimes he will follow along but majority of the time pt lacks ability to initiate tasks without ext assist. Pt was able to progress to CGA using LUE + bed rail and following activities to promote anterior weight shifting. Pt began to make initiations towards end of session but unable to shift weight without OT assist. Pt demonstrated strong RUE grip when holding onto RW, likely able to use stedy to promote anterior lean. Pt transferred to recliner with max A+2. OT to continue to progress pt as able. DC plans remain appropriate for SNF.       If plan is discharge home, recommend the following:  Two people to help with walking and/or transfers;Two people to help with bathing/dressing/bathroom;Assistance with cooking/housework;Assistance with feeding;Direct supervision/assist for medications management;Direct supervision/assist for financial management;Assist for transportation;Help with stairs or ramp for entrance;Supervision due to cognitive status   Equipment Recommendations  None recommended by OT    Recommendations for Other Services      Precautions / Restrictions Precautions Precautions: Fall Recall of Precautions/Restrictions: Impaired Precaution/Restrictions Comments: No spinal prec or brace per MD (L1 subacute fx) Restrictions Weight Bearing Restrictions Per Provider Order: No       Mobility Bed  Mobility Overal bed mobility: Needs Assistance Bed Mobility: Supine to Sit, Sit to Supine     Supine to sit: Total assist, +2 for physical assistance, HOB elevated Sit to supine: +2 for physical assistance, +2 for safety/equipment, HOB elevated, Total assist   General bed mobility comments: pt with limited movement initiation    Transfers Overall transfer level: Needs assistance Equipment used: 2 person hand held assist Transfers: Sit to/from Stand, Bed to chair/wheelchair/BSC Sit to Stand: Max assist, +2 physical assistance Stand pivot transfers: Max assist, +2 physical assistance, +2 safety/equipment               Balance Overall balance assessment: Needs assistance, History of Falls Sitting-balance support: Feet unsupported, Feet supported, Single extremity supported Sitting balance-Leahy Scale: Poor Sitting balance - Comments: pt requires mod-max for upright positioning at EOB, with use of LUE + bed rail able to get to breif periods of CGA. Postural control: Right lateral lean Standing balance support: Bilateral upper extremity supported Standing balance-Leahy Scale: Zero Standing balance comment: MAX A +2 for standing attempt                           ADL either performed or assessed with clinical judgement   ADL Overall ADL's : Needs assistance/impaired Eating/Feeding: Bed level;Maximal assistance   Grooming: Sitting;Wash/dry face;Contact guard assist Grooming Details (indicate cue type and reason): pt initiate washing his face given some time and translation from his son, the rest of ADLs regarding lotioning BLEs needed max A. level of assist can vary up to max A             Lower Body Dressing: Bed  level;Total assistance   Toilet Transfer: Maximal assistance;+2 for physical assistance;+2 for safety/equipment;Stand-pivot;BSC/3in1 Toilet Transfer Details (indicate cue type and reason): simulated with recliner         Functional mobility during  ADLs: Maximal assistance;+2 for physical assistance      Extremity/Trunk Assessment Upper Extremity Assessment RUE Deficits / Details: pt uses BUE for support while seated EOB; difficulties following command for functional reach. at least 3/5 RUE Coordination: decreased fine motor;decreased gross motor LUE Deficits / Details: pt uses BUE for support while seated EOB; difficulties following command for functional reach. at least 3/5 LUE Coordination: decreased fine motor;decreased gross motor            Vision   Vision Assessment?: Vision impaired- to be further tested in functional context;Wears glasses for driving;Wears glasses for reading Additional Comments: R gaze pref, able to scan to L with increased visual cues and inc time.   Perception     Praxis Praxis Praxis: Impaired Praxis Impairment Details: Initiation Praxis-Other Comments: limited/poor initiation   Communication Communication Communication: Impaired Factors Affecting Communication: Non - English speaking, interpreter not available   Cognition Arousal: Alert Behavior During Therapy: Flat affect Cognition: Cognition impaired   Orientation impairments: Situation, Time Awareness: Intellectual awareness impaired, Online awareness impaired   Attention impairment (select first level of impairment): Sustained attention, Focused attention   OT - Cognition Comments: slow processing, inconsistent with command following, typically says yes to everything even when he does not mean so.                 Following commands: Impaired Following commands impaired: Follows one step commands inconsistently      Cueing   Cueing Techniques: Verbal cues, Gestural cues, Tactile cues, Visual cues  Exercises Other Exercises Other Exercises: OT faciliate anterior pelvic til with trunk extension sitting EOB Other Exercises: OT faciliating anterior weight shifitng mutliple times during session in prep for standing.     Shoulder Instructions       General Comments Son present and interpreting/motivating pt.    Pertinent Vitals/ Pain       Pain Assessment Pain Assessment: PAINAD Breathing: normal Negative Vocalization: none Facial Expression: sad, frightened, frown Body Language: tense, distressed pacing, fidgeting Consolability: no need to console PAINAD Score: 2 Pain Location: with movement Pain Descriptors / Indicators: Grimacing Pain Intervention(s): Monitored during session, Repositioned  Home Living                                          Prior Functioning/Environment              Frequency  Min 2X/week        Progress Toward Goals  OT Goals(current goals can now be found in the care plan section)  Progress towards OT goals: Progressing toward goals  Acute Rehab OT Goals OT Goal Formulation: With family Time For Goal Achievement: 12/23/23 Potential to Achieve Goals: Fair  Plan      Co-evaluation                 AM-PAC OT 6 Clicks Daily Activity     Outcome Measure   Help from another person eating meals?: A Lot Help from another person taking care of personal grooming?: A Lot Help from another person toileting, which includes using toliet, bedpan, or urinal?: Total Help from another person bathing (including washing, rinsing, drying)?: Total Help  from another person to put on and taking off regular upper body clothing?: Total Help from another person to put on and taking off regular lower body clothing?: Total 6 Click Score: 8    End of Session Equipment Utilized During Treatment: Gait belt  OT Visit Diagnosis: Muscle weakness (generalized) (M62.81);Repeated falls (R29.6);Other abnormalities of gait and mobility (R26.89);Other symptoms and signs involving the nervous system (R29.898);History of falling (Z91.81)   Activity Tolerance Patient tolerated treatment well   Patient Left with call bell/phone within reach;with family/visitor  present;in chair   Nurse Communication Need for lift equipment;Mobility status        Time: 1447-1535 OT Time Calculation (min): 48 min  Charges: OT General Charges $OT Visit: 1 Visit OT Treatments $Self Care/Home Management : 8-22 mins $Therapeutic Activity: 23-37 mins  12/10/2023  AB, OTR/L  Acute Rehabilitation Services  Office: [phone]   Curtistine JONETTA Das 12/10/2023, 5:38 PM

## 2023-12-10 NOTE — Care Plan (Signed)
 Spoke with son and daughter at bedside.  Reached out to Oncology, awaiting response.

## 2023-12-10 NOTE — Progress Notes (Signed)
 Marco [name]   DOB:1933/12/31   FM#:[id number]   [id number]  Medical oncology follow-up note  Subjective: Patient is known to our service, saw my colleague Dr. Tina on 10/30/2023 for castrate resistant metastatic prostate therapy, he was referred to Dr. Malva for Pluvicto  therapy, which he received for treatment on December 02, 2023.  He was admitted for acute stroke on 12/08/2023.  He is slightly confused, disoriented, he is only able to limited questioning.  I spoke with his son on the phone.   Objective:  Vitals:   12/10/23 1602 12/10/23 1958  BP: (!) 147/88 134/71  Pulse: (!) 105 99  Resp: 19 18  Temp: 98.6 F (37 C) 98.6 F (37 C)  SpO2: 100% 99%    Body mass index is 19.34 kg/m.  Intake/Output Summary (Last 24 hours) at 12/10/2023 2207 Last data filed at 12/10/2023 1700 Gross per 24 hour  Intake 80.23 ml  Output 800 ml  Net -719.77 ml     Sclerae unicteric  Oropharynx clear  No peripheral adenopathy  Lungs clear -- no rales or rhonchi  Heart regular rate and rhythm  Abdomen benign  MSK no focal spinal tenderness, no peripheral edema    CBG (last 3)  Recent Labs    12/10/23 1108 12/10/23 1603 12/10/23 2200  GLUCAP 179* 150* 151*     Labs:  Lab Results  Component Value Date   WBC 6.3 12/10/2023   HGB 10.4 (L) 12/10/2023   HCT 33.3 (L) 12/10/2023   MCV 89.8 12/10/2023   PLT 192 12/10/2023   NEUTROABS 5.3 12/08/2023     Urine Studies No results for input(s): UHGB, CRYS in the last 72 hours.  Invalid input(s): UACOL, UAPR, USPG, UPH, UTP, UGL, UKET, UBIL, UNIT, UROB, [place], UEPI, UWBC, [place], [place], [place], [place], [place]  Basic Metabolic Panel: Recent Labs  Lab 12/08/23 1520 12/08/23 1557 12/09/23 0449 12/10/23 0839  NA 138 137 139 138  K 5.3* 5.3* 5.1 4.9  CL 108 111 110 111  CO2 17*  --  16* 15*  GLUCOSE 125* 121* 106* 126*  BUN 35* 33* 36* 39*  CREATININE 2.93* 2.90* 2.86* 2.81*  CALCIUM  8.5*  --  8.3*  8.0*   GFR Estimated Creatinine Clearance: 14.8 mL/min (A) (by C-G formula based on SCr of 2.81 mg/dL (H)). Liver Function Tests: Recent Labs  Lab 12/08/23 1520 12/10/23 0839  AST 33 32  ALT 20 17  ALKPHOS 137* 130*  BILITOT 1.2 1.1  PROT 6.7 6.3*  ALBUMIN  3.2* 2.8*   No results for input(s): LIPASE, AMYLASE in the last 168 hours. No results for input(s): AMMONIA in the last 168 hours. Coagulation profile Recent Labs  Lab 12/08/23 1520  INR 1.4*    CBC: Recent Labs  Lab 12/08/23 1520 12/08/23 1557 12/09/23 0449 12/10/23 0839  WBC 6.5  --  5.7 6.3  NEUTROABS 5.3  --   --   --   HGB 10.4* 11.6* 10.1* 10.4*  HCT 32.5* 34.0* 31.6* 33.3*  MCV 89.3  --  89.0 89.8  PLT 150  --  145* 192   Cardiac Enzymes: No results for input(s): CKTOTAL, CKMB, CKMBINDEX, TROPONINI in the last 168 hours. BNP: Invalid input(s): POCBNP CBG: Recent Labs  Lab 12/09/23 2112 12/10/23 0627 12/10/23 1108 12/10/23 1603 12/10/23 2200  GLUCAP 128* 135* 179* 150* 151*   D-Dimer No results for input(s): DDIMER in the last 72 hours. Hgb A1c Recent Labs    12/08/23 2140  HGBA1C 5.9*  Lipid Profile Recent Labs    12/08/23 2140  CHOL 138  HDL 64  LDLCALC 58  TRIG 78  CHOLHDL 2.2   Thyroid  function studies Recent Labs    12/08/23 2140  TSH 10.559*   Anemia work up No results for input(s): VITAMINB12, FOLATE, FERRITIN, TIBC, IRON , RETICCTPCT in the last 72 hours. Microbiology Recent Results (from the past 240 hours)  Blood Culture (routine x 2)     Status: None (Preliminary result)   Collection Time: 12/08/23  3:15 PM   Specimen: BLOOD  Result Value Ref Range Status   Specimen Description BLOOD SITE NOT SPECIFIED  Final   Special Requests   Final    BOTTLES DRAWN AEROBIC AND ANAEROBIC Blood Culture results may not be optimal due to an inadequate volume of blood received in culture bottles   Culture   Final    NO GROWTH 2 DAYS Performed at  [facility], [address] [place], [place] [address]    Report Status PENDING  Incomplete  Blood Culture (routine x 2)     Status: None (Preliminary result)   Collection Time: 12/08/23  3:20 PM   Specimen: BLOOD  Result Value Ref Range Status   Specimen Description BLOOD SITE NOT SPECIFIED  Final   Special Requests   Final    BOTTLES DRAWN AEROBIC AND ANAEROBIC Blood Culture results may not be optimal due to an inadequate volume of blood received in culture bottles   Culture   Final    NO GROWTH 2 DAYS Performed at [facility], [address] [place], [place] [address]    Report Status PENDING  Incomplete      Studies:  VAS US  TRANSCRANIAL DOPPLER W BUBBLES Result Date: 12/10/2023  Transcranial Doppler with Bubble Patient Name:  Marco [name]  Date of Exam:   12/10/2023 Medical Rec #: [id number]          Accession #:    [id number] Date of Birth: 03-09-1934           Patient Gender: M Patient Age:   89 years Exam Location:  [facility] Procedure:      VAS US  TRANSCRANIAL DOPPLER W BUBBLES Referring Phys: ARY XU --------------------------------------------------------------------------------  Indications: Embolic stroke, positive for DVT, advanced metastatic prostate cancer. Limitations: Patient movement Comparison Study: No prior study Performing Technologist: Alberta Lis RVS  Examination Guidelines: A complete evaluation includes B-mode imaging, spectral Doppler, color Doppler, and power Doppler as needed of all accessible portions of each vessel. Bilateral testing is considered an integral part of a complete examination. Limited examinations for reoccurring indications may be performed as noted.  Summary:  A vascular evaluation was performed. The right middle cerebral artery was studied. An IV was inserted into the patient's left AC. Verbal informed consent was obtained.  No HITS at rest or during Valsalva. Valsalva was suboptimal given pt medical conditions.  Negative transcranial Doppler Bubble study with no evidence of right to left intracardiac communication.  Diagnosing physician: ARY Cummins MD Electronically signed by ARY Cummins MD on 12/10/2023 at 4:59:20 PM.    Final    VAS US  LOWER EXTREMITY VENOUS (DVT) Result Date: 12/09/2023  Lower Venous DVT Study Patient Name:  PHILOPATEER STRINE  Date of Exam:   12/09/2023 Medical Rec #: [id number]          Accession #:    [id number] Date of Birth: Sep 07, 1933           Patient Gender:  M Patient Age:   62 years Exam Location:  [facility] Procedure:      VAS US  LOWER EXTREMITY VENOUS (DVT) Referring Phys: ARY XU --------------------------------------------------------------------------------  Indications: Embolic stroke.  Risk Factors: Prostate cancer, bony metastases. Comparison Study: 02-11-21 - negative Performing Technologist: Ricka Sturdivant-Jones RDMS, RVT  Examination Guidelines: A complete evaluation includes B-mode imaging, spectral Doppler, color Doppler, and power Doppler as needed of all accessible portions of each vessel. Bilateral testing is considered an integral part of a complete examination. Limited examinations for reoccurring indications may be performed as noted. The reflux portion of the exam is performed with the patient in reverse Trendelenburg.  +---------+---------------+---------+-----------+----------+--------------+ RIGHT    CompressibilityPhasicitySpontaneityPropertiesThrombus Aging +---------+---------------+---------+-----------+----------+--------------+ CFV      Full           Yes      Yes                                 +---------+---------------+---------+-----------+----------+--------------+ SFJ      Full                                                        +---------+---------------+---------+-----------+----------+--------------+ FV Prox  Full                                                         +---------+---------------+---------+-----------+----------+--------------+ FV Mid   Full                                                        +---------+---------------+---------+-----------+----------+--------------+ FV DistalFull                                                        +---------+---------------+---------+-----------+----------+--------------+ PFV      Full                                                        +---------+---------------+---------+-----------+----------+--------------+ POP      Full           Yes      Yes                                 +---------+---------------+---------+-----------+----------+--------------+ PTV      Full                                                        +---------+---------------+---------+-----------+----------+--------------+  PERO     Full                                                        +---------+---------------+---------+-----------+----------+--------------+   +---------+---------------+---------+-----------+----------+--------------+ LEFT     CompressibilityPhasicitySpontaneityPropertiesThrombus Aging +---------+---------------+---------+-----------+----------+--------------+ CFV      Full           Yes      Yes                                 +---------+---------------+---------+-----------+----------+--------------+ SFJ      Full                                                        +---------+---------------+---------+-----------+----------+--------------+ FV Prox  Full                                                        +---------+---------------+---------+-----------+----------+--------------+ FV Mid   Partial        Yes      Yes                  Acute          +---------+---------------+---------+-----------+----------+--------------+ FV DistalPartial        No       No                   Acute           +---------+---------------+---------+-----------+----------+--------------+ PFV      Full                                                        +---------+---------------+---------+-----------+----------+--------------+ POP      Full                                                        +---------+---------------+---------+-----------+----------+--------------+ PTV      Full                                                        +---------+---------------+---------+-----------+----------+--------------+ PERO     None                                         Acute          +---------+---------------+---------+-----------+----------+--------------+     Summary: RIGHT: -  There is no evidence of deep vein thrombosis in the lower extremity.  - No cystic structure found in the popliteal fossa.  LEFT: - Findings consistent with acute deep vein thrombosis involving the left femoral vein, and left peroneal veins.  - No cystic structure found in the popliteal fossa.  *See table(s) above for measurements and observations. Electronically signed by Gaile New MD on 12/09/2023 at 6:11:30 PM.    Final    ECHOCARDIOGRAM COMPLETE Result Date: 12/09/2023    ECHOCARDIOGRAM REPORT   Patient Name:   KEKOA FYOCK Date of Exam: 12/09/2023 Medical Rec #:  [id number]         Height:       68.0 in Accession #:    [id number]        Weight:       127.2 lb Date of Birth:  July 15, 1933          BSA:          1.686 m Patient Age:    89 years          BP:           124/67 mmHg Patient Gender: M                 HR:           99 bpm. Exam Location:  Inpatient Procedure: 2D Echo, Cardiac Doppler and Color Doppler (Both Spectral and Color            Flow Doppler were utilized during procedure). Indications:    Stroke  History:        Patient has prior history of Echocardiogram examinations, most                 recent 02/12/2022. CAD, CKD; Risk Factors:Dyslipidemia.  Sonographer:    Therisa Crouch Referring Phys: [id number]  JINDONG XU IMPRESSIONS  1. Left ventricular ejection fraction, by estimation, is 65 to 70%. The left ventricle has normal function. The left ventricle has no regional wall motion abnormalities. There is moderate asymmetric left ventricular hypertrophy of the basal-septal segment. Left ventricular diastolic parameters are indeterminate.  2. Right ventricular systolic function is normal. The right ventricular size is normal. There is mildly elevated pulmonary artery systolic pressure. The estimated right ventricular systolic pressure is 36.3 mmHg.  3. Left atrial size was mildly dilated.  4. The mitral valve is grossly normal. Trivial mitral valve regurgitation. No evidence of mitral stenosis.  5. The aortic valve is grossly normal. Aortic valve regurgitation is mild. No aortic stenosis is present.  6. The inferior vena cava is dilated in size with <50% respiratory variability, suggesting right atrial pressure of 15 mmHg. Conclusion(s)/Recommendation(s): No intracardiac source of embolism detected on this transthoracic study. Consider a transesophageal echocardiogram to exclude cardiac source of embolism if clinically indicated. FINDINGS  Left Ventricle: Left ventricular ejection fraction, by estimation, is 65 to 70%. The left ventricle has normal function. The left ventricle has no regional wall motion abnormalities. The left ventricular internal cavity size was normal in size. There is  moderate asymmetric left ventricular hypertrophy of the basal-septal segment. Left ventricular diastolic parameters are indeterminate. Right Ventricle: The right ventricular size is normal. No increase in right ventricular wall thickness. Right ventricular systolic function is normal. There is mildly elevated pulmonary artery systolic pressure. The tricuspid regurgitant velocity is 2.31  m/s, and with an assumed right atrial pressure of 15 mmHg, the estimated right ventricular systolic pressure is 36.3 mmHg. Left Atrium:  Left atrial  size was mildly dilated. Right Atrium: Right atrial size was normal in size. Pericardium: There is no evidence of pericardial effusion. Mitral Valve: The mitral valve is grossly normal. Trivial mitral valve regurgitation. No evidence of mitral valve stenosis. Tricuspid Valve: The tricuspid valve is normal in structure. Tricuspid valve regurgitation is trivial. No evidence of tricuspid stenosis. Aortic Valve: The aortic valve is grossly normal. Aortic valve regurgitation is mild. Aortic regurgitation PHT measures 376 msec. No aortic stenosis is present. Aortic valve mean gradient measures 4.0 mmHg. Aortic valve peak gradient measures 8.0 mmHg. Pulmonic Valve: The pulmonic valve was normal in structure. Pulmonic valve regurgitation is trivial. No evidence of pulmonic stenosis. Aorta: The aortic root is normal in size and structure. Venous: The inferior vena cava is dilated in size with less than 50% respiratory variability, suggesting right atrial pressure of 15 mmHg. IAS/Shunts: No atrial level shunt detected by color flow Doppler.  LEFT VENTRICLE PLAX 2D LVIDd:         3.40 cm LVIDs:         2.50 cm LV PW:         0.80 cm LV IVS:        1.40 cm  LV Volumes (MOD) LV vol d, MOD A2C: 35.0 ml LV vol d, MOD A4C: 46.5 ml LV vol s, MOD A2C: 9.6 ml LV vol s, MOD A4C: 12.7 ml LV SV MOD A2C:     25.5 ml LV SV MOD A4C:     46.5 ml LV SV MOD BP:      29.3 ml RIGHT VENTRICLE          IVC RV Basal diam:  2.70 cm  IVC diam: 2.40 cm TAPSE (M-mode): 2.9 cm LEFT ATRIUM             Index LA diam:        2.40 cm 1.42 cm/m LA Vol (A2C):   29.3 ml 17.38 ml/m LA Vol (A4C):   47.2 ml 27.99 ml/m LA Biplane Vol: 37.1 ml 22.00 ml/m  AORTIC VALVE AV Vmax:           141.00 cm/s AV Vmean:          94.100 cm/s AV VTI:            0.227 m AV Peak Grad:      8.0 mmHg AV Mean Grad:      4.0 mmHg LVOT Vmax:         85.40 cm/s LVOT Vmean:        57.500 cm/s LVOT VTI:          0.134 m LVOT/AV VTI ratio: 0.59 AI PHT:            376 msec AR Vena  Contracta: 0.20 cm  AORTA Ao Root diam: 3.40 cm Ao Asc diam:  3.50 cm TRICUSPID VALVE TR Peak grad:   21.3 mmHg TR Vmax:        231.00 cm/s  SHUNTS Systemic VTI: 0.13 m Soyla Merck MD Electronically signed by Soyla Merck MD Signature Date/Time: 12/09/2023/1:44:24 PM    Final     Assessment: 89 y.o. male   Acute stroke Acute on chronic kidney disease, improved Acute metabolic encephalopathy Castration resistant metastatic prostate cancer, status post first Pluvicto  treatment on 12/02/2023 HTN, DM, hypothyroidism   Plan:  - Patient's overall condition has much deteriorated after stroke, he still has delirium, not eating much. - I do not think he is a candidate  for more Pluvicto  treatment due to his poor PS. I will inform Dr. Tina and Dr. Malva - I recommend hospice care.  Patient is likely going to nursing home, son is not able to take him home due to lack of 24/7 care at home.  -I communicated with Dr. Jonel.  -we will f/u as needed.    Onita Mattock, MD 12/10/2023

## 2023-12-10 NOTE — Progress Notes (Signed)
 Speech Language Pathology Treatment: Dysphagia;Cognitive-Linguistic  Patient Details Name: Marco [name] MRN: [id number] DOB: January 01, 1934 Today's Date: 12/10/2023 Time: 8558-8543 SLP Time Calculation (min) (ACUTE ONLY): 15 min  Assessment / Plan / Recommendation Clinical Impression  SLP able to see pt for another session this afternoon for possible diet upgrade. Observed with thin liquids (assessed with straw) and he was able to initiate swallows over 3 trials without s/s aspiration. Assessed with graham cracker and diced peaches. Mastication was prolonged with cracker with trace residue and mildly prolonged with peach with mild lingual residue on right. Recommend upgrade from full liquids to puree (Dys 1), continue thin, may use straw or cup, meds crushed in puree and full assist with meals. Will continue working with him and suspect he may be able to upgrade to minced (Dys 2) in near future.   Pt was awake with periods of drowsiness and cues to stay awake at end of session. Pt is hard of hearing (confirmed by son) and needed cues to attend and repetition of questions. When asked where he was he perseverated on prior activity with eating saying mouth. Given choice of 2 he accurately stated hospital. SLP unable to decipher his response to his diagnosis and therapist told him he had a stroke. Initially he said no and he did not repeat when asked and became drowsy. ST will plan to see pt first of next week.    HPI HPI: [name] is a 89 y.o. 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 MRI acute confluent left PCA infarct and punctate  right cerebellar infarct, left proximal to mid P2 PCA occlusion.      SLP Plan  Continue with current plan of care          Recommendations  Diet recommendations: Dysphagia 1 (puree);Thin liquid Liquids provided via: Cup;Straw Medication Administration: Crushed with puree Supervision: Full supervision/cueing for compensatory strategies Compensations: Minimize environmental distractions;Slow rate;Small sips/bites;Lingual sweep for clearance of pocketing Postural Changes and/or Swallow Maneuvers: Seated upright 90 degrees                Rehab consult Oral care BID   Frequent or constant Supervision/Assistance Dysphagia, oral phase (R13.11);Cognitive communication deficit (M58.158)     Continue with current plan of care     [name]  12/10/2023, 2:14 PM

## 2023-12-10 NOTE — Progress Notes (Signed)
 Speech Language Pathology Treatment: Dysphagia  Patient Details Name: Marco [name] MRN: [id number] DOB: 1933-05-17 Today's Date: 12/10/2023 Time: 8966-8958 SLP Time Calculation (min) (ACUTE ONLY): 8 min  Assessment / Plan / Recommendation Clinical Impression  SLP returned to pt's room after session earlier this am due to RN stating family was present, giving pt water and to make sure it is offered to him as he may not call out. SLP worked with pt to determine if still orally holding and provide family education. Pt's dtr and son present and updated on earlier session. Son stated he feed him breakfast and he was able to swallow timely. Pt consumed cup sips with therapist and applesauce demonstrating timely onset of swallow from informal observation without residue. Pt was likely too sleepy or not cognitively able to consume during first visit. Recommend continue full liquids and ST will continue to see for possible diet upgrade.    HPI HPI: [name] is a 89 y.o. 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      SLP Plan  Continue with current plan of care  Patient needs continued Speech Language Pathology Services       Recommendations  Diet recommendations: Thin liquid (full liquids) Liquids provided via: Cup;No straw Medication Administration: Crushed with puree Supervision: Full supervision/cueing for compensatory strategies Compensations: Slow rate;Small sips/bites Postural Changes and/or Swallow  Maneuvers: Seated upright 90 degrees                Rehab consult Oral care BID   Frequent or constant Supervision/Assistance Dysphagia, unspecified (R13.10)     Continue with current plan of care     [name]  12/10/2023, 10:44 AM

## 2023-12-10 NOTE — Progress Notes (Signed)
 VASCULAR LAB    TCD bubble study has been performed.  See CV proc for preliminary results.   Amarii Amy, RVT 12/10/2023, 2:38 PM

## 2023-12-11 DIAGNOSIS — I639 Cerebral infarction, unspecified: Secondary | ICD-10-CM | POA: Diagnosis not present

## 2023-12-11 LAB — BASIC METABOLIC PANEL WITH GFR
Anion gap: 14 (ref 5–15)
BUN: 35 mg/dL — ABNORMAL HIGH (ref 8–23)
CO2: 14 mmol/L — ABNORMAL LOW (ref 22–32)
Calcium: 7.4 mg/dL — ABNORMAL LOW (ref 8.9–10.3)
Chloride: 113 mmol/L — ABNORMAL HIGH (ref 98–111)
Creatinine, Ser: 2.49 mg/dL — ABNORMAL HIGH (ref 0.61–1.24)
GFR, Estimated: 24 mL/min — ABNORMAL LOW (ref >60)
Glucose, Bld: 155 mg/dL — ABNORMAL HIGH (ref 70–99)
Potassium: 4.3 mmol/L (ref 3.5–5.1)
Sodium: 141 mmol/L (ref 135–145)

## 2023-12-11 LAB — CBC
HCT: 31.6 % — ABNORMAL LOW (ref 39.0–52.0)
Hemoglobin: 9.8 g/dL — ABNORMAL LOW (ref 13.0–17.0)
MCH: 27.9 pg (ref 26.0–34.0)
MCHC: 31 g/dL (ref 30.0–36.0)
MCV: 90 fL (ref 80.0–100.0)
Platelets: 185 K/uL (ref 150–400)
RBC: 3.51 MIL/uL — ABNORMAL LOW (ref 4.22–5.81)
RDW: 15.4 % (ref 11.5–15.5)
WBC: 6.3 K/uL (ref 4.0–10.5)
nRBC: 0 % (ref 0.0–0.2)

## 2023-12-11 LAB — GLUCOSE, CAPILLARY
Glucose-Capillary: 144 mg/dL — ABNORMAL HIGH (ref 70–99)
Glucose-Capillary: 165 mg/dL — ABNORMAL HIGH (ref 70–99)
Glucose-Capillary: 170 mg/dL — ABNORMAL HIGH (ref 70–99)
Glucose-Capillary: 173 mg/dL — ABNORMAL HIGH (ref 70–99)

## 2023-12-11 LAB — HEPARIN LEVEL (UNFRACTIONATED): Heparin Unfractionated: 0.39 [IU]/mL (ref 0.30–0.70)

## 2023-12-11 MED ORDER — LACTATED RINGERS IV SOLN: INTRAVENOUS | Status: AC

## 2023-12-11 NOTE — Progress Notes (Signed)
  Progress Note   Patient: Marco [name] FMW:[id number] DOB: 10-08-1933 DOA: 12/08/2023     3 DOS: the patient was seen and examined on 12/11/2023        Brief hospital course: 89 y.o. M with metastatic prostate CA, DM, HTN, CKD IIIb baseline 1.3-1.5 recent hip fracture last May, hypothyroidism, and prior amaurosis fugax who presented with acute on chronic weakness and falls.  In the ER, found to have AKI and stroke.     Assessment and Plan: Acute on chronic renal failure Chronic kidney disease stage IIIb Acute metabolic acidosis Urinalysis without sediment, renal ultrasound normal. Suspect this is some ATN due to poor p.o. intake/NSAIDs   UOP starting to improve, Cr down to 2.4 (baseline 1.3) -Continue IV fluids - Avoid nephrotoxins and hypotension - Monitor urine output   Acute stroke left PCA MRI brain confirmed left PCA large infarct of the thalamus and punctate R cerebellar, likely embolic Neurology believe from hypercoagulable state  Echo normal, Duplex US  showed L femoral DVT but TCD negative.   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  Recently had a new infusion of a prostate-specific membrane antigen radioligand Pluvicto , after which family noted he stopped eating/walking and couldn't get up.  Admitted with above diagnoses. Oncology recommended hospice.  Family would like to try rehabilitation and revisit an alternative treatment to Pluvicto  with Dr. Tina - Hold Pluvicto    Left lower extremity femoral DVT Bilateral duplex ultrasounds of the legs showed femoral/peroneal left DVT. - Continue heparin   Acute metabolic encephalopathy -Delirium precautions  Pathologic compression fracture of the thoracic spine - PT/OT - Analgesics PRN  Diabetes Glucose control - Continue sliding scale corrections - Hold metformin   Hypertension Blood pressure  normal  Hypothyroidism - Continue LT4   Normocytic anemia Hemoglobin stable, no clinical bleeding        Subjective: Family states that he is having improved oral intake, seems to them to respond appropriately to questions.  To me he has vision loss, seems still confused and significant generalized weakness     Physical Exam: BP 116/66 (BP Location: Right Arm)   Pulse 100   Temp 99.3 F (37.4 C) (Oral)   Resp 17   Ht 5' 8 (1.727 m)   Wt 57.7 kg   SpO2 100%   BMI 19.34 kg/m   Adult male, elderly, frail, opens eyes to voice but does not make eye contact RRR, no murmurs, no peripheral edema Respiratory rate normal, lungs clear without rales or wheezes or overall diminished very much Abdomen soft, no grimace to palpation Bilateral upper extremity weakness is marked but seems symmetric, too weak to lift legs against gravity, but again symmetric, speech seems rambling but not dysarthric, no facial droop     Data Reviewed: Basic metabolic panel shows creatinine down to 2.49 CBC shows hemoglobin slightly down to 9.8     Family Communication: Son, daughter in Social worker, and 2 daughters at the bedside    Disposition: Status is: Inpatient         Author: Lonni SHAUNNA Dalton, MD 12/11/2023 4:48 PM  For on call review [email or web address].

## 2023-12-11 NOTE — Plan of Care (Signed)
  Problem: Skin Integrity: Goal: Risk for impaired skin integrity will decrease Outcome: Progressing   Problem: Clinical Measurements: Goal: Ability to maintain clinical measurements within normal limits will improve Outcome: Progressing Goal: Will remain free from infection Outcome: Progressing Goal: Diagnostic test results will improve Outcome: Progressing Goal: Respiratory complications will improve Outcome: Progressing Goal: Cardiovascular complication will be avoided Outcome: Progressing   Problem: Nutrition: Goal: Adequate nutrition will be maintained Outcome: Progressing   Problem: Elimination: Goal: Will not experience complications related to bowel motility Outcome: Progressing Goal: Will not experience complications related to urinary retention Outcome: Progressing   Problem: Pain Managment: Goal: General experience of comfort will improve and/or be controlled Outcome: Progressing   Problem: Safety: Goal: Ability to remain free from injury will improve Outcome: Progressing   Problem: Skin Integrity: Goal: Risk for impaired skin integrity will decrease Outcome: Progressing

## 2023-12-11 NOTE — Progress Notes (Signed)
 Physical Therapy Treatment Patient Details Name: Marco [name] MRN: [id number] DOB: 1933/07/01 Today's Date: 12/11/2023   History of Present Illness Pt is a 89 y.o. male presenting to [facility] after a fall and inability to walk, found to have subacute L1 fx. 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 Pt also with acute LLE DVT. PMH significant for advanced stage IV prostate cancer with bony mets, Type II DM, L hip fx s/p IM nailing 09/2023, CAD, CKD III   PT Comments  Pt in bed upon arrival and agreeable to PT session. Pt continues to require TotalAx2 for bed mobility with decreased initiation. Once seated on EOB, pt had a right lateral and posterior lean requiring ModA/MaxA for support. Pt unable to correct balance with multi-modal cueing. Focused session on sit<>stand transfers with MaxAx2 and use of Stedy. Pt continued to have a right lateral lean during stand and transfer with assist on the right side to maintain balance. With increased repetitions, pt was able to initiate leaning anteriorly and reach for the [facility] handle. Continue to recommend <3hrs post acute rehab. Acute PT to follow.    If plan is discharge home, recommend the following: A lot of help with walking and/or transfers;A lot of help with bathing/dressing/bathroom;Assistance with cooking/housework;Direct supervision/assist for medications management;Direct supervision/assist for financial management;Assist for transportation;Help with stairs or ramp for entrance   Can travel by private vehicle     No  Equipment Recommendations  None recommended by PT       Precautions / Restrictions Precautions Precautions: Fall Recall of Precautions/Restrictions: Impaired Precaution/Restrictions Comments: No spinal prec or brace per MD (L1 subacute fx) Restrictions Weight Bearing Restrictions Per Provider Order: No     Mobility  Bed  Mobility Overal bed mobility: Needs Assistance Bed Mobility: Rolling, Sidelying to Sit Rolling: Total assist, +2 for physical assistance, +2 for safety/equipment Sidelying to sit: Total assist, +2 for safety/equipment, +2 for physical assistance, Used rails    General bed mobility comments: TotalAx2 for all aspects, no initiation with bed mobility    Transfers Overall transfer level: Needs assistance Equipment used: Ambulation equipment used Transfers: Sit to/from Stand, Bed to chair/wheelchair/BSC Sit to Stand: Max assist, +2 physical assistance, Via lift equipment    General transfer comment: Assist needed initially to reach for stedy handle. With repetitions, pt able to lean forwards and grasp stedy handle. MaxAx2 with use of bed pad and gait belt. Heavy right lateral lean requiring support during stand and transfer. Transfer via Lift Equipment: Stedy    Modified Rankin (Stroke Patients Only) Modified Rankin (Stroke Patients Only) Pre-Morbid Rankin Score: Moderately severe disability Modified Rankin: Severe disability     Balance Overall balance assessment: Needs assistance, History of Falls Sitting-balance support: Feet supported, Single extremity supported Sitting balance-Leahy Scale: Poor Sitting balance - Comments: ModA to MaxA for seated balance with R lateral and posterior lean Postural control: Right lateral lean, Posterior lean Standing balance support: Bilateral upper extremity supported, During functional activity, Reliant on assistive device for balance Standing balance-Leahy Scale: Zero Standing balance comment: MaxAx2 using lift equipment     Communication Communication Communication: Impaired Factors Affecting Communication: Other (comment) (limited verbalizations)  Cognition Arousal: Alert Behavior During Therapy: Flat affect   PT - Cognitive impairments: Orientation, Awareness, Memory, Attention, Initiation, Problem solving, Sequencing, Safety/Judgement    Orientation impairments: Place, Time, Situation    PT - Cognition Comments: Decreased initiation at  beginning of session with improvements noted throughout session and with practice Following commands: Impaired Following commands impaired: Follows one step commands inconsistently    Cueing Cueing Techniques: Verbal cues, Gestural cues, Tactile cues, Visual cues  Exercises Other Exercises Other Exercises: x3 STS with Stedy and MaxAx2        Pertinent Vitals/Pain Pain Assessment Pain Assessment: Faces Faces Pain Scale: Hurts little more Pain Location: BLE with movement Pain Descriptors / Indicators: Grimacing Pain Intervention(s): Limited activity within patient's tolerance, Monitored during session, Repositioned     PT Goals (current goals can now be found in the care plan section) Acute Rehab PT Goals PT Goal Formulation: With family Time For Goal Achievement: 12/23/23 Potential to Achieve Goals: Fair Progress towards PT goals: Progressing toward goals    Frequency    Min 2X/week       AM-PAC PT 6 Clicks Mobility   Outcome Measure  Help needed turning from your back to your side while in a flat bed without using bedrails?: Total Help needed moving from lying on your back to sitting on the side of a flat bed without using bedrails?: Total Help needed moving to and from a bed to a chair (including a wheelchair)?: Total Help needed standing up from a chair using your arms (e.g., wheelchair or bedside chair)?: Total Help needed to walk in hospital room?: Total Help needed climbing 3-5 steps with a railing? : Total 6 Click Score: 6    End of Session Equipment Utilized During Treatment: Gait belt Activity Tolerance: Patient tolerated treatment well Patient left: in chair;with call bell/phone within reach;with chair alarm set;Other (comment) (lift pad) Nurse Communication: Mobility status;Need for lift equipment PT Visit Diagnosis: Unsteadiness on feet  (R26.81);Other abnormalities of gait and mobility (R26.89);Muscle weakness (generalized) (M62.81);History of falling (Z91.81)     Time: 1030-1102 PT Time Calculation (min) (ACUTE ONLY): 32 min  Charges:    $Therapeutic Activity: 23-37 mins PT General Charges $$ ACUTE PT VISIT: 1 Visit                     Kate ORN, PT, DPT Secure Chat Preferred  Rehab Office [phone]    Kate BRAVO Wendolyn 12/11/2023, 1:00 PM

## 2023-12-11 NOTE — Progress Notes (Signed)
 PHARMACY - ANTICOAGULATION CONSULT NOTE  Pharmacy Consult for heparin  Indication: acute DVT  Allergies  Allergen Reactions   Beef-Derived Drug Products Other (See Comments)    Vegetarian   Chicken Allergy Other (See Comments)    Vegetarian   Fish Allergy Other (See Comments)    Vegetarian    Pork-Derived Products Other (See Comments)    Vegetarian     Patient Measurements: Height: 5' 8 (172.7 cm) Weight: 57.7 kg (127 lb 3.3 oz) IBW/kg (Calculated) : 68.4 HEPARIN  DW (KG): 60  Vital Signs: Temp: 99.4 F (37.4 C) (08/08 0743) Temp Source: Oral (08/08 0743) BP: 128/71 (08/08 0743) Pulse Rate: 97 (08/08 0743)  Labs: Recent Labs    12/08/23 1520 12/08/23 1557 12/09/23 0449 12/09/23 2122 12/10/23 0839 12/11/23 0329  HGB 10.4*   < > 10.1*  --  10.4* 9.8*  HCT 32.5*   < > 31.6*  --  33.3* 31.6*  PLT 150  --  145*  --  192 185  LABPROT 17.9*  --   --   --   --   --   INR 1.4*  --   --   --   --   --   HEPARINUNFRC  --   --   --  0.67 0.90* 0.39  CREATININE 2.93*   < > 2.86*  --  2.81* 2.49*   < > = values in this interval not displayed.    Estimated Creatinine Clearance: 16.7 mL/min (A) (by C-G formula based on SCr of 2.49 mg/dL (H)).   Assessment:  89 y.o. male with medical history significant for prostate cancer with metastases who is admitted for multiple falls found to have a stroke. US  doppler confirmed acute deep vein thrombosis involving the left femoral vein, and left peroneal veins. Patient not on anticoagulation PTA. Pharmacy consulted to initiate IV heparin . No bolus due to recent stroke.   Initially started on DAPT per neurology, ok to stop with therapeutic anticoagulation being initiated. CBC wnl, platelets low stable 150 > 145.   Heparin  level therapeutic 0.39 on 700 units/hr. No issues with infusion or bleeding reported.CBC stable, platelets 185.  Goal of Therapy:  Heparin  level 0.3-0.5 units/ml Monitor platelets by anticoagulation protocol: Yes    Plan:  Continue heparin  infusion at 700 units/hr Check heparin  level in 8 hours and daily while on heparin  Continue to monitor H&H and platelets  Thank you for allowing pharmacy to be a part of this patient's care.  Shelba Collier, PharmD, BCPS Clinical Pharmacist

## 2023-12-12 DIAGNOSIS — I6340 Cerebral infarction due to embolism of unspecified cerebral artery: Secondary | ICD-10-CM

## 2023-12-12 LAB — BASIC METABOLIC PANEL WITH GFR
Anion gap: 9 (ref 5–15)
BUN: 28 mg/dL — ABNORMAL HIGH (ref 8–23)
CO2: 16 mmol/L — ABNORMAL LOW (ref 22–32)
Calcium: 7 mg/dL — ABNORMAL LOW (ref 8.9–10.3)
Chloride: 108 mmol/L (ref 98–111)
Creatinine, Ser: 2.32 mg/dL — ABNORMAL HIGH (ref 0.61–1.24)
GFR, Estimated: 26 mL/min — ABNORMAL LOW (ref >60)
Glucose, Bld: 171 mg/dL — ABNORMAL HIGH (ref 70–99)
Potassium: 3.5 mmol/L (ref 3.5–5.1)
Sodium: 133 mmol/L — ABNORMAL LOW (ref 135–145)

## 2023-12-12 LAB — GLUCOSE, CAPILLARY
Glucose-Capillary: 163 mg/dL — ABNORMAL HIGH (ref 70–99)
Glucose-Capillary: 151 mg/dL — ABNORMAL HIGH (ref 70–99)
Glucose-Capillary: 140 mg/dL — ABNORMAL HIGH (ref 70–99)
Glucose-Capillary: 117 mg/dL — ABNORMAL HIGH (ref 70–99)

## 2023-12-12 LAB — CBC
HCT: 26.7 % — ABNORMAL LOW (ref 39.0–52.0)
Hemoglobin: 8.7 g/dL — ABNORMAL LOW (ref 13.0–17.0)
MCH: 28.5 pg (ref 26.0–34.0)
MCHC: 32.6 g/dL (ref 30.0–36.0)
MCV: 87.5 fL (ref 80.0–100.0)
Platelets: 158 K/uL (ref 150–400)
RBC: 3.05 MIL/uL — ABNORMAL LOW (ref 4.22–5.81)
RDW: 15 % (ref 11.5–15.5)
WBC: 5.5 K/uL (ref 4.0–10.5)
nRBC: 0 % (ref 0.0–0.2)

## 2023-12-12 LAB — HEPARIN LEVEL (UNFRACTIONATED): Heparin Unfractionated: 0.22 [IU]/mL — ABNORMAL LOW (ref 0.30–0.70)

## 2023-12-12 LAB — MAGNESIUM: Magnesium: 1.2 mg/dL — ABNORMAL LOW (ref 1.7–2.4)

## 2023-12-12 MED ORDER — APIXABAN 5 MG PO TABS
5.0000 mg | ORAL_TABLET | Freq: Two times a day (BID) | ORAL | Status: DC
  Administered 2023-12-12 – 2023-12-14 (×6): 5 mg via ORAL
  Filled 2023-12-12: qty 2
  Filled 2023-12-12 (×4): qty 1

## 2023-12-12 MED ORDER — APIXABAN 5 MG PO TABS: 10.0000 mg | ORAL_TABLET | Freq: Two times a day (BID) | ORAL | Status: DC

## 2023-12-12 MED ORDER — LACTATED RINGERS IV SOLN: INTRAVENOUS | Status: DC

## 2023-12-12 MED ORDER — MAGNESIUM SULFATE 2 GM/50ML IV SOLN
2.0000 g | Freq: Once | INTRAVENOUS | Status: AC
  Administered 2023-12-12: 2 g via INTRAVENOUS
  Filled 2023-12-12: qty 50

## 2023-12-12 MED ORDER — POTASSIUM CHLORIDE 2 MEQ/ML IV SOLN
INTRAVENOUS | Status: AC
  Filled 2023-12-12 (×2): qty 1000

## 2023-12-12 MED ORDER — APIXABAN 5 MG PO TABS: 5.0000 mg | ORAL_TABLET | Freq: Two times a day (BID) | ORAL | Status: DC

## 2023-12-12 NOTE — Progress Notes (Signed)
  Progress Note   Patient: Marco [name] FMW:[id number] DOB: 04-04-1934 DOA: 12/08/2023     4 DOS: the patient was seen and examined on 12/12/2023        Brief hospital course: 89 y.o. M with metastatic prostate CA, DM, HTN, CKD IIIb baseline 1.3-1.5 recent hip fracture last May, hypothyroidism, and prior amaurosis fugax who presented with acute on chronic weakness and falls.  In the ER, found to have AKI and stroke.     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, lymph node, and liver - Follow-up with oncology after rehabilitation - Palliative care to follow at SNF  Left lower extremity femoral DVT - Stop heparin  drip - Start Eliquis , given recent stroke and small size of DVT, discussed with Neurology and RPh, I will administerELiquis without bolus, feel risk of hemorrhage outweighs acute thrombotic risk  Acute metabolic encephalopathy This is overall gradually improving, - Continue delirium precautions  Pathologic compression fracture of the thoracic spine - PT/OT - Analgesia  Hypothyroidism - Continue levothyroxine   Normocytic anemia Hemoglobin trending down, no clinical bleeding observed or reported - Trend hemoglobin  Diabetes Glucoses well-controlled - Continue sliding scale corrections - Hold metformin        Subjective: Making good urine output.  Family have no concerns.  Patient denies pain.  He remains encephalopathic, but responsive to questions.     Physical Exam: BP 116/66 (BP Location: Right Arm)   Pulse (!) 102   Temp 99.3 F (37.4 C) (Oral)   Resp 15   Ht 5' 8 (1.727 m)   Wt 57.7 kg   SpO2 99%   BMI 19.34 kg/m   Frail elderly adult male, lying in bed, looks at my  direction, but vision is impaired Oropharynx very dry Tachycardic, regular, no murmurs, no peripheral edema Respiratory rate seems normal, lung sounds very diminished, poor inspiratory effort, no rales or wheezes appreciated Abdomen soft, no grimace to palpation Severe bilateral upper extremity weakness, unable to lift legs against gravity, speech seems soft and hoarse but not dysarthric, face is symmetric    Data Reviewed: Basic metabolic panel shows mild hyponatremia, improving renal function gradually, improving acidosis CBC shows mildly worsening anemia     Family Communication: daughter and granddaughter at the bedside    Disposition: Status is: Inpatient         Author: Lonni SHAUNNA Dalton, MD 12/12/2023 2:20 PM  For on call review [email or web address].

## 2023-12-12 NOTE — Plan of Care (Signed)
  Problem: Clinical Measurements: Goal: Ability to maintain clinical measurements within normal limits will improve Outcome: Progressing Goal: Will remain free from infection Outcome: Progressing Goal: Diagnostic test results will improve Outcome: Progressing Goal: Respiratory complications will improve Outcome: Progressing Goal: Cardiovascular complication will be avoided Outcome: Progressing   Problem: Elimination: Goal: Will not experience complications related to bowel motility Outcome: Progressing Goal: Will not experience complications related to urinary retention Outcome: Progressing   Problem: Pain Managment: Goal: General experience of comfort will improve and/or be controlled Outcome: Progressing   Problem: Safety: Goal: Ability to remain free from injury will improve Outcome: Progressing   Problem: Skin Integrity: Goal: Risk for impaired skin integrity will decrease Outcome: Progressing

## 2023-12-12 NOTE — Plan of Care (Signed)
  Problem: Ischemic Stroke/TIA Tissue Perfusion: Goal: Complications of ischemic stroke/TIA will be minimized 12/12/2023 0606 by Gaetana Randall Mathew GORMAN, RN Outcome: Progressing 12/12/2023 0525 by Gaetana Randall Mathew GORMAN, RN Outcome: Progressing   Problem: Tissue Perfusion: Goal: Adequacy of tissue perfusion will improve 12/12/2023 0606 by Gaetana Randall Mathew GORMAN, RN Outcome: Progressing 12/12/2023 0525 by Gaetana Randall Mathew GORMAN, RN Outcome: Progressing   Problem: Education: Goal: Knowledge of General Education information will improve Description: Including pain rating scale, medication(s)/side effects and non-pharmacologic comfort measures 12/12/2023 0606 by Gaetana Randall Mathew GORMAN, RN Outcome: Progressing 12/12/2023 0525 by Gaetana Randall Mathew GORMAN, RN Outcome: Progressing

## 2023-12-12 NOTE — Progress Notes (Signed)
 PHARMACY - ANTICOAGULATION CONSULT NOTE  Pharmacy Consult for heparin  Indication: acute DVT  Allergies  Allergen Reactions   Beef-Derived Drug Products Other (See Comments)    Vegetarian   Chicken Allergy Other (See Comments)    Vegetarian   Fish Allergy Other (See Comments)    Vegetarian    Pork-Derived Products Other (See Comments)    Vegetarian     Patient Measurements: Height: 5' 8 (172.7 cm) Weight: 57.7 kg (127 lb 3.3 oz) IBW/kg (Calculated) : 68.4 HEPARIN  DW (KG): 60  Vital Signs: Temp: 99.2 F (37.3 C) (08/09 0738) Temp Source: Oral (08/09 0738) BP: 93/51 (08/09 0738) Pulse Rate: 103 (08/09 0738)  Labs: Recent Labs    12/10/23 0839 12/11/23 0329 12/12/23 0335  HGB 10.4* 9.8* 8.7*  HCT 33.3* 31.6* 26.7*  PLT 192 185 158  HEPARINUNFRC 0.90* 0.39 0.22*  CREATININE 2.81* 2.49* 2.32*    Estimated Creatinine Clearance: 18 mL/min (A) (by C-G formula based on SCr of 2.32 mg/dL (H)).   Assessment:  89 y.o. male with medical history significant for prostate cancer with metastases who is admitted for multiple falls found to have a stroke. US  doppler confirmed acute deep vein thrombosis involving the left femoral vein, and left peroneal veins. Patient not on anticoagulation PTA. Pharmacy consulted to initiate IV heparin . No bolus due to recent stroke.   Initially started on DAPT per neurology, ok to stop with therapeutic anticoagulation being initiated.   Heparin  level subtherapeutic at 0.22 on infusion 700 units/hr. Hgb (8.7) and plts (158) decreased, but likely due to slight volume up and improving renal function. Spoke to nurse, no issues with heparin  infusion (not running with another line, no infusion pauses, no bleeding concerns).  Goal of Therapy:  Heparin  level 0.3-0.5 units/ml Monitor platelets by anticoagulation protocol: Yes   Plan:  Increase heparin  infusion to 750 units/hr Check 8 hr heparin  level Daily heparin  level and CBC Continue to monitor  H&H, platelets, signs and symptoms of bleeding  Thank you for allowing pharmacy to be a part of this patient's care.  Anneta Rounds, PharmD PGY-1 Pharmacy Resident [facility]  12/12/2023 10:16 AM

## 2023-12-13 DIAGNOSIS — I6340 Cerebral infarction due to embolism of unspecified cerebral artery: Secondary | ICD-10-CM | POA: Diagnosis not present

## 2023-12-13 LAB — CBC
HCT: 27.6 % — ABNORMAL LOW (ref 39.0–52.0)
Hemoglobin: 8.9 g/dL — ABNORMAL LOW (ref 13.0–17.0)
MCH: 28.4 pg (ref 26.0–34.0)
MCHC: 32.2 g/dL (ref 30.0–36.0)
MCV: 88.2 fL (ref 80.0–100.0)
Platelets: 186 K/uL (ref 150–400)
RBC: 3.13 MIL/uL — ABNORMAL LOW (ref 4.22–5.81)
RDW: 15.2 % (ref 11.5–15.5)
WBC: 4.7 K/uL (ref 4.0–10.5)
nRBC: 0 % (ref 0.0–0.2)

## 2023-12-13 LAB — GLUCOSE, CAPILLARY
Glucose-Capillary: 132 mg/dL — ABNORMAL HIGH (ref 70–99)
Glucose-Capillary: 125 mg/dL — ABNORMAL HIGH (ref 70–99)
Glucose-Capillary: 175 mg/dL — ABNORMAL HIGH (ref 70–99)
Glucose-Capillary: 137 mg/dL — ABNORMAL HIGH (ref 70–99)

## 2023-12-13 LAB — BASIC METABOLIC PANEL WITH GFR
Anion gap: 8 (ref 5–15)
BUN: 29 mg/dL — ABNORMAL HIGH (ref 8–23)
CO2: 19 mmol/L — ABNORMAL LOW (ref 22–32)
Calcium: 7.6 mg/dL — ABNORMAL LOW (ref 8.9–10.3)
Chloride: 110 mmol/L (ref 98–111)
Creatinine, Ser: 2.51 mg/dL — ABNORMAL HIGH (ref 0.61–1.24)
GFR, Estimated: 24 mL/min — ABNORMAL LOW (ref >60)
Glucose, Bld: 151 mg/dL — ABNORMAL HIGH (ref 70–99)
Potassium: 4.1 mmol/L (ref 3.5–5.1)
Sodium: 137 mmol/L (ref 135–145)

## 2023-12-13 LAB — CULTURE, BLOOD (ROUTINE X 2)
Culture: NO GROWTH
Culture: NO GROWTH

## 2023-12-13 LAB — MAGNESIUM: Magnesium: 1.9 mg/dL (ref 1.7–2.4)

## 2023-12-13 MED ORDER — QUETIAPINE FUMARATE 25 MG PO TABS
25.0000 mg | ORAL_TABLET | Freq: Once | ORAL | Status: AC
  Administered 2023-12-14 (×2): 25 mg via ORAL
  Filled 2023-12-13: qty 1

## 2023-12-13 NOTE — Plan of Care (Signed)
  Problem: Education: Goal: Ability to describe self-care measures that may prevent or decrease complications (Diabetes Survival Skills Education) will improve 12/13/2023 0641 by Gaetana Randall Mathew GORMAN, RN Outcome: Progressing 12/13/2023 0537 by Gaetana Randall Mathew GORMAN, RN Outcome: Progressing Goal: Individualized Educational Video(s) 12/13/2023 0641 by Gaetana Randall Mathew GORMAN, RN Outcome: Progressing 12/13/2023 0537 by Gaetana Randall Mathew GORMAN, RN Outcome: Progressing   Problem: Fluid Volume: Goal: Ability to maintain a balanced intake and output will improve 12/13/2023 0641 by Gaetana Randall Mathew GORMAN, RN Outcome: Progressing 12/13/2023 0537 by Gaetana Randall Mathew GORMAN, RN Outcome: Progressing

## 2023-12-13 NOTE — Plan of Care (Signed)

## 2023-12-13 NOTE — Progress Notes (Signed)
  Progress Note   Patient: Marco [name] FMW:[id number] DOB: July 29, 1933 DOA: 12/08/2023     5 DOS: the patient was seen and examined on 12/13/2023 at 8:09AM      Brief hospital course: 89 y.o. M with metastatic prostate CA, DM, HTN, CKD IIIb baseline 1.3-1.5 recent hip fracture last May, hypothyroidism, and prior amaurosis fugax who presented with acute on chronic weakness and falls.  In the ER, found to have AKI and stroke.     Assessment and Plan: AKI Likely ischemic ATN in the setting of Pluvicto .  Urinalysis unremarkable, improved somewhat with IV fluids, stalled today.  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  Family discussion 8/9, they observe gradual improvement in his functional status in the last few days, would like to attempt rehabilitation and return to Dr. Tina in the outpatient setting - PT/OT - Palliative care to follow at SNF   Left femoral DVT - Continue Eliquis   Pathologic compression fracture of the thoracic spine - PT/OT  Acute metabolic encephalopathy At baseline the patient has no significant dementia, maybe mild cognitive impairment diagnosed, but is oriented x 3.  Here in the hospital, at times he has been disoriented and confused.  This seems to be improving.  The cause of his encephalopathy is renal failure, age, and stroke. - Continue delirium precautions  Normocytic anemia Blood level stable today, no bleeding reported or observed - Trend hemoglobin  Hypothyroidism - Continue levothyroxine   Type 2 diabetes Glucose seems well-controlled - Continue sliding scale correction insulin  - Stop metformin  given renal function.       Subjective: Nursing  of no concerns.  Patient denies pain.  Asks about going home.  Says that someone has not been taking good care of him, but is unable to specify details.  No fever, respiratory distress.       Physical Exam: BP 133/68 (BP Location: Right Arm)   Pulse (!) 105   Temp (!) 97.3 F (36.3 C) (Oral)   Resp (P) 18   Ht 5' 8 (1.727 m)   Wt 57.7 kg   SpO2 100%   BMI 19.34 kg/m   Elderly adult male, lying in bed, appears weak and tired, makes eye contact today Oropharynx moist Tachycardic, irregular, no murmurs, no peripheral edema Respiratory rate normal, lungs clear without rales or wheezes Abdomen soft without tenderness to palpation or guarding, no ascites or distention Severe generalized weakness in all 4 extremities, unable to lift the legs against gravity, 3/5 strength in both upper extremities.  He is oriented to self, but psychomotor rate is very slow, and he is unable to answer further questions about orientation    Data Reviewed: Basic metabolic panel shows magnesium  resolved, potassium 4.1, creatinine up to 2.5, sodium normal CBC shows hemoglobin stable at 8.9    Family Communication:     Disposition: Status is: Inpatient         Author: Lonni SHAUNNA Dalton, MD 12/13/2023 2:13 PM  For on call review [email or web address].

## 2023-12-13 NOTE — Discharge Instructions (Signed)
 Information on my medicine - ELIQUIS  (apixaban )  Why was Eliquis  prescribed for you? Eliquis  was prescribed to treat blood clots that may have been found in the veins of your legs (deep vein thrombosis) or in your lungs (pulmonary embolism) and to reduce the risk of them occurring again.  What do You need to know about Eliquis  ? The  dose is  ONE 5 mg tablet taken TWICE daily.  Eliquis  may be taken with or without food.   Try to take the dose about the same time in the morning and in the evening. If you have difficulty swallowing the tablet whole please discuss with your pharmacist how to take the medication safely.  Take Eliquis  exactly as prescribed and DO NOT stop taking Eliquis  without talking to the doctor who prescribed the medication.  Stopping may increase your risk of developing a new blood clot.  Refill your prescription before you run out.  After discharge, you should have regular check-up appointments with your healthcare provider that is prescribing your Eliquis .    What do you do if you miss a dose? If a dose of ELIQUIS  is not taken at the scheduled time, take it as soon as possible on the same day and twice-daily administration should be resumed. The dose should not be doubled to make up for a missed dose.  Important Safety Information A possible side effect of Eliquis  is bleeding. You should call your healthcare provider right away if you experience any of the following: ? Bleeding from an injury or your nose that does not stop. ? Unusual colored urine (red or dark brown) or unusual colored stools (red or black). ? Unusual bruising for unknown reasons. ? A serious fall or if you hit your head (even if there is no bleeding).  Some medicines may interact with Eliquis  and might increase your risk of bleeding or clotting while on Eliquis . To help avoid this, consult your healthcare provider or pharmacist prior to using any new prescription or non-prescription  medications, including herbals, vitamins, non-steroidal anti-inflammatory drugs (NSAIDs) and supplements.  This website has more information on Eliquis  (apixaban ): [email or web address] .com/eliquis dena

## 2023-12-14 ENCOUNTER — Other Ambulatory Visit (HOSPITAL_COMMUNITY): Payer: Self-pay

## 2023-12-14 ENCOUNTER — Inpatient Hospital Stay: Attending: Internal Medicine

## 2023-12-14 ENCOUNTER — Inpatient Hospital Stay

## 2023-12-14 ENCOUNTER — Telehealth (HOSPITAL_COMMUNITY): Payer: Self-pay | Admitting: Pharmacy Technician

## 2023-12-14 DIAGNOSIS — N179 Acute kidney failure, unspecified: Secondary | ICD-10-CM | POA: Diagnosis not present

## 2023-12-14 DIAGNOSIS — C61 Malignant neoplasm of prostate: Secondary | ICD-10-CM | POA: Diagnosis not present

## 2023-12-14 DIAGNOSIS — D6182 Myelophthisis: Secondary | ICD-10-CM | POA: Insufficient documentation

## 2023-12-14 DIAGNOSIS — I6340 Cerebral infarction due to embolism of unspecified cerebral artery: Secondary | ICD-10-CM | POA: Diagnosis not present

## 2023-12-14 DIAGNOSIS — Z7189 Other specified counseling: Secondary | ICD-10-CM

## 2023-12-14 DIAGNOSIS — C7951 Secondary malignant neoplasm of bone: Secondary | ICD-10-CM

## 2023-12-14 LAB — CBC
HCT: 26.4 % — ABNORMAL LOW (ref 39.0–52.0)
Hemoglobin: 8.2 g/dL — ABNORMAL LOW (ref 13.0–17.0)
MCH: 28.7 pg (ref 26.0–34.0)
MCHC: 31.1 g/dL (ref 30.0–36.0)
MCV: 92.3 fL (ref 80.0–100.0)
Platelets: 171 K/uL (ref 150–400)
RBC: 2.86 MIL/uL — ABNORMAL LOW (ref 4.22–5.81)
RDW: 15.2 % (ref 11.5–15.5)
WBC: 4.4 K/uL (ref 4.0–10.5)
nRBC: 0 % (ref 0.0–0.2)

## 2023-12-14 LAB — BASIC METABOLIC PANEL WITH GFR
Anion gap: 11 (ref 5–15)
BUN: 29 mg/dL — ABNORMAL HIGH (ref 8–23)
CO2: 15 mmol/L — ABNORMAL LOW (ref 22–32)
Calcium: 8 mg/dL — ABNORMAL LOW (ref 8.9–10.3)
Chloride: 112 mmol/L — ABNORMAL HIGH (ref 98–111)
Creatinine, Ser: 2.65 mg/dL — ABNORMAL HIGH (ref 0.61–1.24)
GFR, Estimated: 22 mL/min — ABNORMAL LOW (ref >60)
GFR, Estimated: 22 mL/min — ABNORMAL LOW (ref >60)
Glucose, Bld: 130 mg/dL — ABNORMAL HIGH (ref 70–99)
Potassium: 4.6 mmol/L (ref 3.5–5.1)
Sodium: 138 mmol/L (ref 135–145)

## 2023-12-14 LAB — GLUCOSE, CAPILLARY
Glucose-Capillary: 160 mg/dL — ABNORMAL HIGH (ref 70–99)
Glucose-Capillary: 135 mg/dL — ABNORMAL HIGH (ref 70–99)
Glucose-Capillary: 123 mg/dL — ABNORMAL HIGH (ref 70–99)

## 2023-12-14 LAB — HEMOGLOBIN AND HEMATOCRIT, BLOOD
HCT: 28.8 % — ABNORMAL LOW (ref 39.0–52.0)
Hemoglobin: 9.1 g/dL — ABNORMAL LOW (ref 13.0–17.0)

## 2023-12-14 LAB — TYPE AND SCREEN
ABO/RH(D): O POS
Antibody Screen: NEGATIVE

## 2023-12-14 MED ORDER — LEVOTHYROXINE SODIUM 112 MCG PO TABS
112.0000 ug | ORAL_TABLET | Freq: Every day | ORAL | Status: DC
  Administered 2023-12-15 – 2023-12-16 (×4): 112 ug via ORAL
  Filled 2023-12-14 (×2): qty 1

## 2023-12-14 MED ORDER — SODIUM CHLORIDE 0.9% IV SOLUTION: Freq: Once | INTRAVENOUS | Status: DC

## 2023-12-14 NOTE — Assessment & Plan Note (Addendum)
 Unable to tolerate Pluvicto .  Will not be able to receive further prostate cancer directed treatment.  Hospice is recommended.

## 2023-12-14 NOTE — Plan of Care (Signed)
   Problem: Education: Goal: Ability to describe self-care measures that may prevent or decrease complications (Diabetes Survival Skills Education) will improve Outcome: Not Progressing Goal: Individualized Educational Video(s) Outcome: Not Progressing   Problem: Coping: Goal: Ability to adjust to condition or change in health will improve Outcome: Not Progressing   Problem: Fluid Volume: Goal: Ability to maintain a balanced intake and output will improve Outcome: Not Progressing   Problem: Health Behavior/Discharge Planning: Goal: Ability to identify and utilize available resources and services will improve Outcome: Not Progressing Goal: Ability to manage health-related needs will improve Outcome: Not Progressing   Problem: Metabolic: Goal: Ability to maintain appropriate glucose levels will improve Outcome: Not Progressing   Problem: Nutritional: Goal: Maintenance of adequate nutrition will improve Outcome: Not Progressing Goal: Progress toward achieving an optimal weight will improve Outcome: Not Progressing   Problem: Skin Integrity: Goal: Risk for impaired skin integrity will decrease Outcome: Not Progressing   Problem: Tissue Perfusion: Goal: Adequacy of tissue perfusion will improve Outcome: Not Progressing   Problem: Education: Goal: Knowledge of General Education information will improve Description: Including pain rating scale, medication(s)/side effects and non-pharmacologic comfort measures Outcome: Not Progressing   Problem: Health Behavior/Discharge Planning: Goal: Ability to manage health-related needs will improve Outcome: Not Progressing   Problem: Clinical Measurements: Goal: Ability to maintain clinical measurements within normal limits will improve Outcome: Not Progressing Goal: Will remain free from infection Outcome: Not Progressing Goal: Diagnostic test results will improve Outcome: Not Progressing Goal: Respiratory complications will  improve Outcome: Not Progressing Goal: Cardiovascular complication will be avoided Outcome: Not Progressing   Problem: Activity: Goal: Risk for activity intolerance will decrease Outcome: Not Progressing   Problem: Nutrition: Goal: Adequate nutrition will be maintained Outcome: Not Progressing   Problem: Coping: Goal: Level of anxiety will decrease Outcome: Not Progressing   Problem: Elimination: Goal: Will not experience complications related to bowel motility Outcome: Not Progressing Goal: Will not experience complications related to urinary retention Outcome: Not Progressing   Problem: Pain Managment: Goal: General experience of comfort will improve and/or be controlled Outcome: Not Progressing   Problem: Safety: Goal: Ability to remain free from injury will improve Outcome: Not Progressing   Problem: Skin Integrity: Goal: Risk for impaired skin integrity will decrease Outcome: Not Progressing   Problem: Education: Goal: Knowledge of disease or condition will improve Outcome: Not Progressing Goal: Knowledge of secondary prevention will improve (MUST DOCUMENT ALL) Outcome: Not Progressing Goal: Knowledge of patient specific risk factors will improve (DELETE if not current risk factor) Outcome: Not Progressing   Problem: Ischemic Stroke/TIA Tissue Perfusion: Goal: Complications of ischemic stroke/TIA will be minimized Outcome: Not Progressing   Problem: Coping: Goal: Will verbalize positive feelings about self Outcome: Not Progressing Goal: Will identify appropriate support needs Outcome: Not Progressing   Problem: Health Behavior/Discharge Planning: Goal: Ability to manage health-related needs will improve Outcome: Not Progressing Goal: Goals will be collaboratively established with patient/family Outcome: Not Progressing   Problem: Self-Care: Goal: Ability to participate in self-care as condition permits will improve Outcome: Not Progressing Goal:  Verbalization of feelings and concerns over difficulty with self-care will improve Outcome: Not Progressing Goal: Ability to communicate needs accurately will improve Outcome: Not Progressing   Problem: Nutrition: Goal: Risk of aspiration will decrease Outcome: Not Progressing Goal: Dietary intake will improve Outcome: Not Progressing

## 2023-12-14 NOTE — Assessment & Plan Note (Addendum)
 Continue IV fluid and supportive measures

## 2023-12-14 NOTE — TOC Progression Note (Signed)
 Transition of Care [facility]) - Progression Note    Patient Details  Name: Marco [name] MRN: [id number] Date of Birth: [birth year]  [facility] CM/SW Contact  Isaiah Public, LCSWA Phone Number: 12/14/2023, 11:15 AM  Clinical Narrative:     CSW spoke with patients daughter-n-law Binta who accepted SNF bed offer with [facility]. Patients daughter-n-law inquired about private room. Nicki with [employer] [employer] informed CSW that they have a private room on short term rehab side when patient transitions to LTC after rehab Nicki informed CSW that it will be semi private. CSW informed patients daughter-n-law. CSW will continue to follow and assist with patients dc planning needs.  Expected Discharge Plan: Skilled Nursing Facility Barriers to Discharge: Continued Medical Work up               Expected Discharge Plan and Services In-house Referral: Clinical Social Work Discharge Planning Services: CM Consult Post Acute Care Choice: Skilled Nursing Facility Living arrangements for the past 2 months: Single Family Home                                       Social Drivers of Health (SDOH) Interventions SDOH Screenings   Food Insecurity: No Food Insecurity (12/08/2023)  Housing: Low Risk  (12/08/2023)  Transportation Needs: No Transportation Needs (12/08/2023)  Utilities: Not At Risk (12/08/2023)  Alcohol  Screen: Low Risk  (09/04/2021)  Depression (PHQ2-9): Low Risk  (11/23/2023)  Financial Resource Strain: Low Risk  (10/30/2021)  Physical Activity: Inactive (10/25/2020)  Social Connections: Moderately Isolated (12/08/2023)  Stress: No Stress Concern Present (09/04/2021)  Tobacco Use: Low Risk  (12/08/2023)    Readmission Risk Interventions     No data to display

## 2023-12-14 NOTE — Progress Notes (Signed)
 Speech Language Pathology Treatment: Dysphagia  Patient Details Name: Marco [name] MRN: [id number] DOB: 06-16-1933 Today's Date: 12/14/2023 Time: 1420-1430 SLP Time Calculation (min) (ACUTE ONLY): 10 min  Assessment / Plan / Recommendation Clinical Impression  [name] was very sleepy during our session. Son at the bedside. He alerted to name, sternal rub, but would quickly fall back to sleep. During brief wakefulness, with max prompts, he accepted small sips of water with adequate airway protection, but no solids were offered given lethargy. D/W son. SLP will continue to follow while admitted.    HPI HPI: Marco [name] is a 89 y.o. 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      SLP Plan  Continue with current plan of care          Recommendations  Diet recommendations: Dysphagia 1 (puree);Thin liquid Liquids provided via: Cup;Straw Medication Administration: Crushed with puree Supervision: Full supervision/cueing for compensatory strategies Compensations: Minimize environmental distractions;Slow rate;Small sips/bites;Lingual sweep for clearance of pocketing Postural Changes and/or Swallow Maneuvers: Seated upright 90 degrees                  Oral care BID   Frequent or constant Supervision/Assistance Dysphagia, oral phase (R13.11)     Continue with current plan of care    Hamza Empson L. Vona, MA CCC/SLP Clinical Specialist - Acute Care SLP Acute Rehabilitation Services Office number  [phone]  Vona Palma Laurice  12/14/2023, 2:42 PM

## 2023-12-14 NOTE — Progress Notes (Signed)
 Physical Therapy Treatment Patient Details Name: Marco [name] MRN: [id number] DOB: Jun 20, 1933 Today's Date: 12/14/2023   History of Present Illness Pt is a 89 y.o. male presenting to [facility] after a fall and inability to walk, found to have subacute L1 fx. 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 Pt also with acute LLE DVT. PMH significant for advanced stage IV prostate cancer with bony mets, Type II DM, L hip fx s/p IM nailing 09/2023, CAD, CKD III    PT Comments  Pt not progressing significantly towards his physical therapy goals this session; he was asleep upon entrance and required stimulation to arouse. Performed PROM to all extremities. Pt requiring +2 assist for supine <> sit and is unable to stand with lift equipment [name]). Recommend hoyer lift out of bed. Patient will benefit from continued inpatient follow up therapy, <3 hours/day.    If plan is discharge home, recommend the following: A lot of help with walking and/or transfers;A lot of help with bathing/dressing/bathroom;Assistance with cooking/housework;Direct supervision/assist for medications management;Direct supervision/assist for financial management;Assist for transportation;Help with stairs or ramp for entrance   Can travel by private vehicle     No  Equipment Recommendations  None recommended by PT    Recommendations for Other Services       Precautions / Restrictions Precautions Precautions: Fall Recall of Precautions/Restrictions: Impaired Precaution/Restrictions Comments: No spinal prec or brace per MD (L1 subacute fx) Restrictions Weight Bearing Restrictions Per Provider Order: No     Mobility  Bed Mobility Overal bed mobility: Needs Assistance Bed Mobility: Rolling, Sidelying to Sit, Sit to Supine Rolling: Total assist, +2 for physical assistance, +2 for safety/equipment   Supine to sit: +2 for  physical assistance, Total assist Sit to supine: Total assist, +2 for physical assistance   General bed mobility comments: TotalAx2 for all aspects, no initiation with bed mobility    Transfers Overall transfer level: Needs assistance                 General transfer comment: Unable to stand with [facility]    Ambulation/Gait                   Stairs             Wheelchair Mobility     Tilt Bed    Modified Rankin (Stroke Patients Only) Modified Rankin (Stroke Patients Only) Pre-Morbid Rankin Score: Moderately severe disability Modified Rankin: Severe disability     Balance Overall balance assessment: Needs assistance, History of Falls Sitting-balance support: Feet supported, No upper extremity supported Sitting balance-Leahy Scale: Poor Sitting balance - Comments: Posterior lean, requiring min-modA                                    Communication Communication Communication: Impaired Factors Affecting Communication: Other (comment) (limited verbalizations)  Cognition Arousal: Alert Behavior During Therapy: Flat affect   PT - Cognitive impairments: Orientation, Awareness, Memory, Attention, Initiation, Problem solving, Sequencing, Safety/Judgement                         Following commands: Impaired Following commands impaired: Follows one step commands inconsistently    Cueing Cueing Techniques: Verbal cues, Gestural cues, Tactile cues, Visual cues  Exercises General Exercises - Upper Extremity Shoulder Flexion: PROM, Both, 5  reps, Supine General Exercises - Lower Extremity Heel Slides: PROM, Both, 5 reps, Supine    General Comments        Pertinent Vitals/Pain Pain Assessment Pain Assessment: Faces Faces Pain Scale: Hurts even more Pain Location: Everywhere with movement Pain Descriptors / Indicators: Grimacing Pain Intervention(s): Limited activity within patient's tolerance, Monitored during session,  Repositioned    Home Living                          Prior Function            PT Goals (current goals can now be found in the care plan section) Acute Rehab PT Goals Patient Stated Goal: unable to state goal Potential to Achieve Goals: Fair Progress towards PT goals: Not progressing toward goals - comment    Frequency    Min 2X/week      PT Plan      Co-evaluation              AM-PAC PT 6 Clicks Mobility   Outcome Measure  Help needed turning from your back to your side while in a flat bed without using bedrails?: Total Help needed moving from lying on your back to sitting on the side of a flat bed without using bedrails?: Total Help needed moving to and from a bed to a chair (including a wheelchair)?: Total Help needed standing up from a chair using your arms (e.g., wheelchair or bedside chair)?: Total Help needed to walk in hospital room?: Total Help needed climbing 3-5 steps with a railing? : Total 6 Click Score: 6    End of Session Equipment Utilized During Treatment: Gait belt Activity Tolerance: Patient limited by pain Patient left: in bed;with call bell/phone within reach;with bed alarm set Nurse Communication: Mobility status;Need for lift equipment PT Visit Diagnosis: Unsteadiness on feet (R26.81);Other abnormalities of gait and mobility (R26.89);Muscle weakness (generalized) (M62.81);History of falling (Z91.81)     Time: 1135-1200 PT Time Calculation (min) (ACUTE ONLY): 25 min  Charges:    $Therapeutic Activity: 23-37 mins PT General Charges $$ ACUTE PT VISIT: 1 Visit                     [name], PT, DPT Acute Rehabilitation Services Office [phone]    [name] 12/14/2023, 2:02 PM

## 2023-12-14 NOTE — Progress Notes (Signed)
 Ijybjayjp Marco [name]   DOB:03-Jan-1934   FM#:[id number]    ASSESSMENT & PLAN:  89 y.o. male with mCRPC, DM, HTN, CKD IIIb baseline 1.3-1.5 recent hip fracture last May, hypothyroidism, and prior amaurosis fugax who presented with acute on chronic weakness and falls found CVA and AKI.   Patient continues to be very weak, PS is fairly poor required 2 assistants.  Kidney function has not improved over the past 2 days.  He was not very responsive when I was in the room earlier today.  I spoke to patient's son and son's wife over the phone.  We discussed unfortunately, he will not be able to tolerate further treatment.  Pluvicto  is not recommended with his renal function.  Chemotherapy will be more difficult to tolerate and likely will result in earlier death.  Given his declining functional status, significant disease burden for metastases, hospice is recommended.  Discussed admitting to a facility with hospice care transition is very reasonable.  They seem to understand.  Will communicate with Dr. Jonel.  Assessment & Plan Stroke [facility]) Waxing waning mental status and persistent weakness Currently on anticoagulation Prostate cancer metastatic to bone (HCC) Unable to tolerate Pluvicto .  Will not be able to receive further prostate cancer directed treatment.  Hospice is recommended. 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 Recommend hospice care  Code Status DNR/DNI  Goals of care Discussed today.  Recommend hospice care   Discharge planning Recommend hospice care.  All questions were answered.    Thank you for the consult.  Please call if any questions.  Marco JAYSON Chihuahua, [name] 12/14/2023 2:36 PM  Subjective:  Marco [name] was very drowsy this morning.  He opens his eyes very briefly and falling back asleep.  He did not answer my questions.  Nursing  report not much reaction overnight.  Required 2 assistance.  He just received oxycodone  earlier.  Objective:  Vitals:   12/14/23 0752 12/14/23 1110  BP: 100/81 (!) 104/57  Pulse: 93 87  Resp: 18 18  Temp: 98 F (36.7 C) 97.8 F (36.6 C)  SpO2: 100% 99%     Intake/Output Summary (Last 24 hours) at 12/14/2023 1436 Last data filed at 12/14/2023 1100 Gross per 24 hour  Intake 657.3 ml  Output --  Net 657.3 ml    GENERAL: Drowsy, no distress EYES: PERRL briefly LUNGS: No wheeze, rales and clear to auscultation bilaterally with normal breathing effort.  HEART: regular rate & rhythm  ABDOMEN: abdomen soft, non-tender and non-distended Musculoskeletal: Able to wiggle toes on simple command NEURO: Drowsy.  Follow a few simple commands.   Labs:  Recent Labs    11/23/23 1225 12/02/23 1220 12/08/23 1520 12/08/23 1557 12/10/23 0839 12/11/23 0329 12/12/23 0335 12/13/23 0626 12/14/23 0820  NA 135   < > 138   < > 138   < > 133* 137 138  K 4.6   < > 5.3*   < > 4.9   < > 3.5 4.1 4.6  CL 103   < > 108   < > 111   < > 108 110 112*  CO2 26   < > 17*   < > 15*   < > 16* 19* 15*  GLUCOSE 159*   < > 125*   < > 126*   < > 171* 151* 130*  BUN 30*   < > 35*   < >  39*   < > 28* 29* 29*  CREATININE 1.31*   < > 2.93*   < > 2.81*   < > 2.32* 2.51* 2.65*  CALCIUM  9.7   < > 8.5*   < > 8.0*   < > 7.0* 7.6* 8.0*  GFRNONAA 52*   < > 20*   < > 21*   < > 26* 24* 22*  PROT 6.8  --  6.7  --  6.3*  --   --   --   --   ALBUMIN  3.7  --  3.2*  --  2.8*  --   --   --   --   AST 30  --  33  --  32  --   --   --   --   ALT 15  --  20  --  17  --   --   --   --   ALKPHOS 111  --  137*  --  130*  --   --   --   --   BILITOT 0.7  --  1.2  --  1.1  --   --   --   --    < > = values in this interval not displayed.    Studies:  VAS US  TRANSCRANIAL DOPPLER W BUBBLES Result Date: 12/10/2023  Transcranial Doppler with Bubble Patient Name:  Marco [name]  Date of Exam:   12/10/2023 Medical Rec #: [id number]           Accession #:    [id number] Date of Birth: [birth year]           Patient Gender: M Patient Age:   71 years Exam Location:  [facility] Procedure:      VAS US  TRANSCRANIAL DOPPLER W BUBBLES Referring Phys: Marco [name] --------------------------------------------------------------------------------  Indications: Embolic stroke, positive for DVT, advanced metastatic prostate cancer. Limitations: Patient movement Comparison Study: No prior study Performing Technologist: Marco [name]  Examination Guidelines: A complete evaluation includes B-mode imaging, spectral Doppler, color Doppler, and power Doppler as needed of all accessible portions of each vessel. Bilateral testing is considered an integral part of a complete examination. Limited examinations for reoccurring indications may be performed as noted.  Summary:  A vascular evaluation was performed. The right middle cerebral artery was studied. An IV was inserted into the patient's left AC. Verbal informed consent was obtained.  No HITS at rest or during Valsalva. Valsalva was suboptimal given pt medical conditions. Negative transcranial Doppler Bubble study with no evidence of right to left intracardiac communication.  Diagnosing physician: Marco [name] Electronically signed by Marco [name] on 12/10/2023 at 4:59:20 PM.    Final    VAS US  LOWER EXTREMITY VENOUS (DVT) Result Date: 12/09/2023  Lower Venous DVT Study Patient Name:  Marco [name]  Date of Exam:   12/09/2023 Medical Rec #: [id number]          Accession #:    [id number] Date of Birth: 05/14/1933           Patient Gender: M Patient Age:   89 years Exam Location:  [facility] Procedure:      VAS US  LOWER EXTREMITY VENOUS (DVT) Referring Phys: Marco [name] --------------------------------------------------------------------------------  Indications: Embolic stroke.  Risk Factors: Prostate cancer, bony metastases. Comparison Study: 02-11-21 - negative Performing Technologist: Marco  Sturdivant-Jones RDMS, [name]  Examination Guidelines: A complete evaluation includes B-mode imaging, spectral Doppler, color Doppler, and power Doppler as needed  of all accessible portions of each vessel. Bilateral testing is considered an integral part of a complete examination. Limited examinations for reoccurring indications may be performed as noted. The reflux portion of the exam is performed with the patient in reverse Trendelenburg.  +---------+---------------+---------+-----------+----------+--------------+ RIGHT    CompressibilityPhasicitySpontaneityPropertiesThrombus Aging +---------+---------------+---------+-----------+----------+--------------+ CFV      Full           Yes      Yes                                 +---------+---------------+---------+-----------+----------+--------------+ SFJ      Full                                                        +---------+---------------+---------+-----------+----------+--------------+ FV Prox  Full                                                        +---------+---------------+---------+-----------+----------+--------------+ FV Mid   Full                                                        +---------+---------------+---------+-----------+----------+--------------+ FV DistalFull                                                        +---------+---------------+---------+-----------+----------+--------------+ PFV      Full                                                        +---------+---------------+---------+-----------+----------+--------------+ POP      Full           Yes      Yes                                 +---------+---------------+---------+-----------+----------+--------------+ PTV      Full                                                        +---------+---------------+---------+-----------+----------+--------------+ PERO     Full                                                         +---------+---------------+---------+-----------+----------+--------------+   +---------+---------------+---------+-----------+----------+--------------+ LEFT  CompressibilityPhasicitySpontaneityPropertiesThrombus Aging +---------+---------------+---------+-----------+----------+--------------+ CFV      Full           Yes      Yes                                 +---------+---------------+---------+-----------+----------+--------------+ SFJ      Full                                                        +---------+---------------+---------+-----------+----------+--------------+ FV Prox  Full                                                        +---------+---------------+---------+-----------+----------+--------------+ FV Mid   Partial        Yes      Yes                  Acute          +---------+---------------+---------+-----------+----------+--------------+ FV DistalPartial        No       No                   Acute          +---------+---------------+---------+-----------+----------+--------------+ PFV      Full                                                        +---------+---------------+---------+-----------+----------+--------------+ POP      Full                                                        +---------+---------------+---------+-----------+----------+--------------+ PTV      Full                                                        +---------+---------------+---------+-----------+----------+--------------+ PERO     None                                         Acute          +---------+---------------+---------+-----------+----------+--------------+     Summary: RIGHT: - There is no evidence of deep vein thrombosis in the lower extremity.  - No cystic structure found in the popliteal fossa.  LEFT: - Findings consistent with acute deep vein thrombosis involving the left femoral vein, and left peroneal veins.   - No cystic structure found in the popliteal fossa.  *See table(s) above for measurements and observations. Electronically signed by Gaile New [name] on 12/09/2023 at 6:11:30 PM.  Final    ECHOCARDIOGRAM COMPLETE Result Date: 12/09/2023    ECHOCARDIOGRAM REPORT   Patient Name:   Marco [name] Date of Exam: 12/09/2023 Medical Rec #:  [id number]         Height:       68.0 in Accession #:    [id number]        Weight:       127.2 lb Date of Birth:  [birth year]          BSA:          1.686 m Patient Age:    89 years          BP:           124/67 mmHg Patient Gender: M                 HR:           99 bpm. Exam Location:  Inpatient Procedure: 2D Echo, Cardiac Doppler and Color Doppler (Both Spectral and Color            Flow Doppler were utilized during procedure). Indications:    Stroke  History:        Patient has prior history of Echocardiogram examinations, most                 recent 02/12/2022. CAD, CKD; Risk Factors:Dyslipidemia.  Sonographer:    Therisa Crouch Referring Phys: [id number] JINDONG [name] IMPRESSIONS  1. Left ventricular ejection fraction, by estimation, is 65 to 70%. The left ventricle has normal function. The left ventricle has no regional wall motion abnormalities. There is moderate asymmetric left ventricular hypertrophy of the basal-septal segment. Left ventricular diastolic parameters are indeterminate.  2. Right ventricular systolic function is normal. The right ventricular size is normal. There is mildly elevated pulmonary artery systolic pressure. The estimated right ventricular systolic pressure is 36.3 mmHg.  3. Left atrial size was mildly dilated.  4. The mitral valve is grossly normal. Trivial mitral valve regurgitation. No evidence of mitral stenosis.  5. The aortic valve is grossly normal. Aortic valve regurgitation is mild. No aortic stenosis is present.  6. The inferior vena cava is dilated in size with <50% respiratory variability, suggesting right atrial pressure of 15 mmHg.  Conclusion(s)/Recommendation(s): No intracardiac source of embolism detected on this transthoracic study. Consider a transesophageal echocardiogram to exclude cardiac source of embolism if clinically indicated. FINDINGS  Left Ventricle: Left ventricular ejection fraction, by estimation, is 65 to 70%. The left ventricle has normal function. The left ventricle has no regional wall motion abnormalities. The left ventricular internal cavity size was normal in size. There is  moderate asymmetric left ventricular hypertrophy of the basal-septal segment. Left ventricular diastolic parameters are indeterminate. Right Ventricle: The right ventricular size is normal. No increase in right ventricular wall thickness. Right ventricular systolic function is normal. There is mildly elevated pulmonary artery systolic pressure. The tricuspid regurgitant velocity is 2.31  m/s, and with an assumed right atrial pressure of 15 mmHg, the estimated right ventricular systolic pressure is 36.3 mmHg. Left Atrium: Left atrial size was mildly dilated. Right Atrium: Right atrial size was normal in size. Pericardium: There is no evidence of pericardial effusion. Mitral Valve: The mitral valve is grossly normal. Trivial mitral valve regurgitation. No evidence of mitral valve stenosis. Tricuspid Valve: The tricuspid valve is normal in structure. Tricuspid valve regurgitation is trivial. No evidence of tricuspid stenosis. Aortic Valve: The aortic valve is grossly normal. Aortic valve regurgitation is mild.  Aortic regurgitation PHT measures 376 msec. No aortic stenosis is present. Aortic valve mean gradient measures 4.0 mmHg. Aortic valve peak gradient measures 8.0 mmHg. Pulmonic Valve: The pulmonic valve was normal in structure. Pulmonic valve regurgitation is trivial. No evidence of pulmonic stenosis. Aorta: The aortic root is normal in size and structure. Venous: The inferior vena cava is dilated in size with less than 50% respiratory  variability, suggesting right atrial pressure of 15 mmHg. IAS/Shunts: No atrial level shunt detected by color flow Doppler.  LEFT VENTRICLE PLAX 2D LVIDd:         3.40 cm LVIDs:         2.50 cm LV PW:         0.80 cm LV IVS:        1.40 cm  LV Volumes (MOD) LV vol d, MOD A2C: 35.0 ml LV vol d, MOD A4C: 46.5 ml LV vol s, MOD A2C: 9.6 ml LV vol s, MOD A4C: 12.7 ml LV SV MOD A2C:     25.5 ml LV SV MOD A4C:     46.5 ml LV SV MOD BP:      29.3 ml RIGHT VENTRICLE          IVC RV Basal diam:  2.70 cm  IVC diam: 2.40 cm TAPSE (M-mode): 2.9 cm LEFT ATRIUM             Index LA diam:        2.40 cm 1.42 cm/m LA Vol (A2C):   29.3 ml 17.38 ml/m LA Vol (A4C):   47.2 ml 27.99 ml/m LA Biplane Vol: 37.1 ml 22.00 ml/m  AORTIC VALVE AV Vmax:           141.00 cm/s AV Vmean:          94.100 cm/s AV VTI:            0.227 m AV Peak Grad:      8.0 mmHg AV Mean Grad:      4.0 mmHg LVOT Vmax:         85.40 cm/s LVOT Vmean:        57.500 cm/s LVOT VTI:          0.134 m LVOT/AV VTI ratio: 0.59 AI PHT:            376 msec AR Vena Contracta: 0.20 cm  AORTA Ao Root diam: 3.40 cm Ao Asc diam:  3.50 cm TRICUSPID VALVE TR Peak grad:   21.3 mmHg TR Vmax:        231.00 cm/s  SHUNTS Systemic VTI: 0.13 m Soyla Merck [name] Electronically signed by Soyla Merck [name] Signature Date/Time: 12/09/2023/1:44:24 PM    Final    MR Brain W and Wo Contrast Addendum Date: 12/08/2023 ADDENDUM REPORT: 12/08/2023 21:34 ADDENDUM: Findings discussed with the on call provider at 9:17 p.m. Electronically Signed   By: Gilmore GORMAN Molt M.D.   On: 12/08/2023 21:34   Result Date: 12/08/2023 CLINICAL DATA:  Stroke, follow up Stroke, also hx of cancer with met; Stroke/TIA, determine embolic source EXAM: MRI HEAD WITHOUT AND WITH CONTRAST MRA HEAD WITHOUT CONTRAST MRA NECK WITHOUT AND WITH CONTRAST TECHNIQUE: Multiplanar, multi-echo pulse sequences of the brain and surrounding structures were acquired without and with intravenous contrast. Angiographic images of the  Circle of Willis were acquired using MRA technique without intravenous contrast. Angiographic images of the neck were acquired using MRA technique without and with intravenous contrast. Carotid stenosis measurements (when applicable) are obtained utilizing NASCET criteria,  using the distal internal carotid diameter as the denominator. CONTRAST:  5mL GADAVIST  GADOBUTROL  1 MMOL/ML IV SOLN COMPARISON:  CT head from earlier today. FINDINGS: MRI HEAD FINDINGS Brain: Acute left PCA territory infarct involving the left occipital lobe, hippocampus and thalamus. Additional punctate acute infarct in the right cerebellum. Edema without substantial mass effect. No midline shift. Small amount of petechial hemorrhage associated with the left occipital infarct. No evidence of mass occupying acute hemorrhage, mass lesion or hydrocephalus. No pathologic enhancement. Vascular: See below. Skull and upper cervical spine: Normal marrow signal. Sinuses/Orbits: Clear sinuses.  No acute orbital findings. MRA HEAD FINDINGS Anterior circulation: Bilateral intracranial ICAs, MCAs, and ACAs are patent without proximal hemodynamically significant stenosis. Posterior circulation: Bilateral intradural vertebral arteries, basilar artery and right PCA are patent without proximal him at least significant stenosis. Left proximal to mid P2 PCA occlusion. MRA NECK FINDINGS Aortic arch: Great vessel origins are patent. Motion limited assessment. Right carotid system: On motion limited evaluation the common carotid internal carotid arteries appear patent without significant stenosis. Left carotid system: On motion limited evaluation the common carotid internal carotid arteries appear patent without significant stenosis. Vertebral arteries: Limited evaluation due to motion and contrast timing. The vertebral arteries appear patent bilaterally without visible high-grade stenosis. IMPRESSION: 1. Acute confluent left PCA infarct and punctate right cerebellar  infarct. 2. Left proximal to mid P2 PCA occlusion. Electronically Signed: By: Gilmore GORMAN Molt M.D. On: 12/08/2023 20:45   MR ANGIO HEAD WO CONTRAST Addendum Date: 12/08/2023 ADDENDUM REPORT: 12/08/2023 21:34 ADDENDUM: Findings discussed with the on call provider at 9:17 p.m. Electronically Signed   By: Gilmore GORMAN Molt M.D.   On: 12/08/2023 21:34   Result Date: 12/08/2023 CLINICAL DATA:  Stroke, follow up Stroke, also hx of cancer with met; Stroke/TIA, determine embolic source EXAM: MRI HEAD WITHOUT AND WITH CONTRAST MRA HEAD WITHOUT CONTRAST MRA NECK WITHOUT AND WITH CONTRAST TECHNIQUE: Multiplanar, multi-echo pulse sequences of the brain and surrounding structures were acquired without and with intravenous contrast. Angiographic images of the Circle of Willis were acquired using MRA technique without intravenous contrast. Angiographic images of the neck were acquired using MRA technique without and with intravenous contrast. Carotid stenosis measurements (when applicable) are obtained utilizing NASCET criteria, using the distal internal carotid diameter as the denominator. CONTRAST:  5mL GADAVIST  GADOBUTROL  1 MMOL/ML IV SOLN COMPARISON:  CT head from earlier today. FINDINGS: MRI HEAD FINDINGS Brain: Acute left PCA territory infarct involving the left occipital lobe, hippocampus and thalamus. Additional punctate acute infarct in the right cerebellum. Edema without substantial mass effect. No midline shift. Small amount of petechial hemorrhage associated with the left occipital infarct. No evidence of mass occupying acute hemorrhage, mass lesion or hydrocephalus. No pathologic enhancement. Vascular: See below. Skull and upper cervical spine: Normal marrow signal. Sinuses/Orbits: Clear sinuses.  No acute orbital findings. MRA HEAD FINDINGS Anterior circulation: Bilateral intracranial ICAs, MCAs, and ACAs are patent without proximal hemodynamically significant stenosis. Posterior circulation: Bilateral  intradural vertebral arteries, basilar artery and right PCA are patent without proximal him at least significant stenosis. Left proximal to mid P2 PCA occlusion. MRA NECK FINDINGS Aortic arch: Great vessel origins are patent. Motion limited assessment. Right carotid system: On motion limited evaluation the common carotid internal carotid arteries appear patent without significant stenosis. Left carotid system: On motion limited evaluation the common carotid internal carotid arteries appear patent without significant stenosis. Vertebral arteries: Limited evaluation due to motion and contrast timing. The vertebral arteries appear patent bilaterally without visible  high-grade stenosis. IMPRESSION: 1. 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 2. Left proximal to mid P2 PCA occlusion. Electronically Signed: By: Gilmore GORMAN Molt M.D. On: 12/08/2023 20:45   MR Angiogram Neck W or Wo Contrast Result Date: 12/08/2023 CLINICAL DATA:  Provided history: Stroke/TIA, determine embolic source. EXAM: MRA NECK WITHOUT AND WITH CONTRAST TECHNIQUE: Multiplanar and multiecho pulse sequences of the neck were obtained without and with intravenous contrast. Angiographic images of the neck were obtained using MRA technique without and with intravenous contrast. CONTRAST:  5mL GADAVIST  GADOBUTROL  1 MMOL/ML IV SOLN COMPARISON:  Same-day brain MRI 12/08/2023. Same day MRA head 12/08/2023. FINDINGS: Aortic arch: Standard aortic branching. No hemodynamically significant innominate or proximal subclavian artery stenosis. Right carotid system: CCA and ICA patent within the neck without stenosis. Tortuosity of the cervical ICA. Left carotid system: CCA and ICA patent within the neck without stenosis. Tortuosity of the cervical ICA. Vertebral arteries: Patent within the neck without stenosis. The left vertebral artery is dominant. IMPRESSION: 1. The common carotid, internal carotid and vertebral arteries are patent  within the neck without stenosis. 2. MRA head findings separately reported. Electronically Signed   By: Rockey Childs D.O.   On: 12/08/2023 20:42   CT Lumbar Spine Wo Contrast Result Date: 12/08/2023 CLINICAL DATA:  Fall with back pain EXAM: CT LUMBAR SPINE WITHOUT CONTRAST TECHNIQUE: Multidetector CT imaging of the lumbar spine was performed without intravenous contrast administration. Multiplanar CT image reconstructions were also generated. RADIATION DOSE REDUCTION: This exam was performed according to the departmental dose-optimization program which includes automated exposure control, adjustment of the mA and/or kV according to patient size and/or use of iterative reconstruction technique. COMPARISON:  Radiographs 12/08/2023, 11/18/2023, chest CT 02/12/2022 FINDINGS: Segmentation: Based on chest CT from October 2023, hypoplastic ribs are present at T12. Transitional anatomy, for the purposes of reporting, transitional vertebra will be designated sacralized L5 and the first non rib-bearing lumbar type vertebra will be designated L1, careful attention to lumbar numbering recommended if there is any planned intervention. Alignment: Normal. Vertebrae: Multiple sclerotic and lucent lesions throughout the spine corresponding to history of widespread skeletal metastatic disease. Comminuted fracture involving the L1 vertebral body, involves the anterior and posterior vertebral body, including the superior endplate. Fracture through the posterior cortex of the vertebral body. The fracture also involves the bilateral pedicles. Fracture is present on radiographs from July 16, new finding since June 2024. Diffuse height loss of the vertebral body compared to prior CT images, estimated at 25%. Minimal 2 mm retropulsion of right posterior vertebral fracture fragment, no significant canal stenosis. No other discrete fractures are visualized. Paraspinal and other soft tissues: Paravertebral soft tissue swelling at L1. Disc  levels: At L1-L2, disc space narrowing. Central, left foraminal and extraforaminal disc bulging. No high-grade canal stenosis. Facet degenerative changes. At L2-L3, disc space narrowing. Posterior disc osteophyte complex with diffuse disc bulge. Ligamentum flavum thickening and hypertrophic facet degenerative changes. Moderate severe canal stenosis. At L3-L4, disc space narrowing. Disc bulge, ligamentum flavum thickening and hypertrophic facet degeneration results in moderate severe canal stenosis. At L4-L5, disc space narrowing. Posterior disc osteophyte complex, ligamentum flavum thickening and hypertrophic facet degenerative changes results in severe canal stenosis. There is moderate bilateral foraminal narrowing. Rudimentary disc at transitional L5 and S1 segment. No canal stenosis. IMPRESSION: 1. Suspected transition anatomy as discussed above. 2. Comminuted likely pathologic fracture involving the L1 vertebral body, involving the anterior and posterior vertebral body, including the superior endplate. The fracture  also involves the bilateral pedicles. Diffuse height loss of the vertebral body compared to prior CT images, estimated at 25%. Minimal 2 mm retropulsion of right posterior vertebral fracture fragment, no significant canal stenosis. Fracture is likely subacute as it was present on radiograph from July. 3. Multiple sclerotic and lucent lesions throughout the spine corresponding to history of widespread skeletal metastatic disease. 4. Multilevel degenerative changes with multilevel moderate severe canal stenosis due to combination of disc disease, ligamentum flavum thickening and hypertrophic facet degeneration Electronically Signed   By: Luke Bun M.D.   On: 12/08/2023 20:34   US  RENAL Result Date: 12/08/2023 CLINICAL DATA:  Acute kidney injury EXAM: RENAL / URINARY TRACT ULTRASOUND COMPLETE COMPARISON:  CT 10/14/2022 FINDINGS: Right Kidney: Renal measurements: 5.7 x 2.4 x 2.8 cm = volume: 20.7  mL. Cortex is echogenic. No hydronephrosis. Upper pole cyst measuring 26 mm, no imaging follow-up is recommended. Left Kidney: Renal measurements: 10.6 x 5.2 x 4.1 cm = volume: 115.6 mL. Cortex appears slightly echogenic. No hydronephrosis. Interpolar cyst measuring 35 mm, no imaging follow-up is recommended. Bladder: Appears normal for degree of bladder distention. Other: None. IMPRESSION: 1. Echogenic kidneys bilaterally consistent with medical renal disease, no hydronephrosis. There is atrophy of the right kidney Electronically Signed   By: Luke Bun M.D.   On: 12/08/2023 20:10   CT PELVIS WO CONTRAST Result Date: 12/08/2023 CLINICAL DATA:  Hip trauma EXAM: CT PELVIS WITHOUT CONTRAST TECHNIQUE: Multidetector CT imaging of the pelvis was performed following the standard protocol without intravenous contrast. RADIATION DOSE REDUCTION: This exam was performed according to the departmental dose-optimization program which includes automated exposure control, adjustment of the mA and/or kV according to patient size and/or use of iterative reconstruction technique. COMPARISON:  12/08/2023, 11/26/2023 PET-CT, hip radiograph 09/27/2023, CT 10/14/2022 FINDINGS: Urinary Tract:  No abnormality visualized. Bowel: Moderate retained feces at the rectum. No acute bowel wall thickening. Vascular/Lymphatic: Aortic atherosclerosis. Tortuous ectatic common iliac arteries. No suspicious lymph nodes Reproductive:  Enlarged prostate Other:  Negative for pelvic effusion or free air. Musculoskeletal: Diffuse heterogeneous mineralization of the pelvis and femurs with faintly visible sclerosis and areas of subtle lucency, corresponding to history of widespread skeletal metastatic disease on recent PET CT. Status post intramedullary rodding of the right femur with intact hardware and no fracture. Suspicion of lucent femoral bone lesion with cortical erosion at the posterior lower trochanter, series 4, image 109. Status post  intramedullary rodding of left femur across comminuted intertrochanteric fracture. There is mild sclerosis and periosteal new bone formation at the fracture margins consistent with more remote injury. No definite acute displaced fracture is seen. Markedly displaced lesser trochanteric fracture fragment. IMPRESSION: 1. Status post intramedullary rodding of left femur across comminuted intertrochanteric fracture. There is mild sclerosis and periosteal new bone formation at the fracture margins consistent with more remote injury. No definite acute displaced fracture is seen. 2. Status post intramedullary rodding of the right femur with intact hardware and no fracture. Suspicion of lucent femoral bone lesion with cortical erosion at the posterior lower trochanter. 3. Diffuse heterogeneous mineralization of the pelvis and femurs with faintly visible sclerosis and areas of subtle lucency, corresponding to history of widespread skeletal metastatic disease on recent PET CT. 4. Aortic atherosclerosis. Aortic Atherosclerosis (ICD10-I70.0). Electronically Signed   By: Luke Bun M.D.   On: 12/08/2023 20:06   DG Chest 1 View Result Date: 12/08/2023 CLINICAL DATA:  [id number] Sepsis [facility]) [id number] EXAM: CHEST  1 VIEW COMPARISON:  Chest x-ray 09/26/2023  FINDINGS: The heart and mediastinal contours are within normal limits. Atherosclerotic plaque. Low lung volumes. No focal consolidation. No pulmonary edema. No pleural effusion. No pneumothorax. No acute osseous abnormality. IMPRESSION: 1. No active disease. 2.  Aortic Atherosclerosis (ICD10-I70.0). Electronically Signed   By: Morgane  Naveau M.D.   On: 12/08/2023 14:35   DG Lumbar Spine Complete Result Date: 12/08/2023 CLINICAL DATA:  fall EXAM: LUMBAR SPINE - COMPLETE 4+ VIEW COMPARISON:  CT abdomen pelvis 10/14/2022 FINDINGS: Limited evaluation due to overlapping osseous structures and overlying soft tissues. Age-indeterminate L2 vertebral body height loss. Multilevel  moderate severe degenerative change of the spine. Multilevel osseous neural foraminal stenosis. Alignment is normal. Multilevel intervertebral disc space narrowing. Vascular calcification. IMPRESSION: Age-indeterminate L2 vertebral body height loss. Limited evaluation due to overlapping osseous structures and overlying soft tissues. Recommend cross-sectional imaging for further evaluation. Electronically Signed   By: Morgane  Naveau M.D.   On: 12/08/2023 14:34   DG HIP UNILAT WITH PELVIS 2-3 VIEWS LEFT Result Date: 12/08/2023 CLINICAL DATA:  [id number] Fall [id number] EXAM: DG HIP (WITH OR WITHOUT PELVIS) 2-3V LEFT; DG HIP (WITH OR WITHOUT PELVIS) 2-3V RIGHT COMPARISON:  PET CT 11/26/2023, x-ray left femur 09/27/2023 FINDINGS: There is no evidence of hip fracture or dislocation of the right hip. Redemonstration of partially visualized interlocking intramedullary nail fixation of the right femur. Redemonstration of subacute to chronic healing left femoral inter trochanteric fracture status post interlocking intramedullary nail fixation of the femur. Some periosteal reaction noted along the left intertrochanteric fracture with superimposed acute fracture not excluded. No acute displaced fracture or diastasis of the bones of the pelvis. There is no evidence of arthropathy or other focal bone abnormality. IMPRESSION: 1. Bilateral interlocking intramedullary nail fixation with healing subacute to chronic left femoral intertrochanteric fracture. Some periosteal reaction noted along the left intertrochanteric fracture with superimposed periprosthetic acute inter trochanteric fracture not excluded. 2. Negative for acute traumatic injury of the right hip or bones of the pelvis. Electronically Signed   By: Morgane  Naveau M.D.   On: 12/08/2023 14:32   DG HIP UNILAT WITH PELVIS 2-3 VIEWS RIGHT Result Date: 12/08/2023 CLINICAL DATA:  [id number] Fall [id number] EXAM: DG HIP (WITH OR WITHOUT PELVIS) 2-3V LEFT; DG HIP (WITH OR WITHOUT PELVIS)  2-3V RIGHT COMPARISON:  PET CT 11/26/2023, x-ray left femur 09/27/2023 FINDINGS: There is no evidence of hip fracture or dislocation of the right hip. Redemonstration of partially visualized interlocking intramedullary nail fixation of the right femur. Redemonstration of subacute to chronic healing left femoral inter trochanteric fracture status post interlocking intramedullary nail fixation of the femur. Some periosteal reaction noted along the left intertrochanteric fracture with superimposed acute fracture not excluded. No acute displaced fracture or diastasis of the bones of the pelvis. There is no evidence of arthropathy or other focal bone abnormality. IMPRESSION: 1. Bilateral interlocking intramedullary nail fixation with healing subacute to chronic left femoral intertrochanteric fracture. Some periosteal reaction noted along the left intertrochanteric fracture with superimposed periprosthetic acute inter trochanteric fracture not excluded. 2. Negative for acute traumatic injury of the right hip or bones of the pelvis. Electronically Signed   By: Morgane  Naveau M.D.   On: 12/08/2023 14:32   CT Head Wo Contrast Result Date: 12/08/2023 EXAM: CT HEAD WITHOUT CONTRAST 12/08/2023 01:03:19 PM TECHNIQUE: CT of the head was performed without the administration of intravenous contrast. Automated exposure control, iterative reconstruction, and/or weight based adjustment of the mA/kV was utilized to reduce the radiation dose to as low as reasonably achievable.  COMPARISON: 02/11/2022 CLINICAL HISTORY: Mental status change, unknown cause. AMS. FINDINGS: BRAIN AND VENTRICLES: Since the previous study, the patient has developed a late subacute infarct within the left occipital lobe. There are also new subacute lacunar infarcts within the left thalamus. There is mild-to-moderate periventricular white matter disease. No acute hemorrhage. Gray-white differentiation is preserved. No hydrocephalus. No extra-axial collection.  No mass effect or midline shift. ORBITS: The patient is status post bilateral lens replacement. No acute abnormality. SINUSES: No acute abnormality. SOFT TISSUES AND SKULL: No acute soft tissue abnormality. No skull fracture. VASCULATURE: There is calcification within the cavernous carotid arteries. IMPRESSION: 1. Late subacute infarct within the left occipital lobe and new subacute lacunar infarcts within the left thalamus. 2. Mild-to-moderate periventricular white matter disease. 3. Calcification within the cavernous carotid arteries. 4. Status post bilateral lens replacement. Electronically signed by: evalene coho 12/08/2023 01:37 PM EDT RP Workstation: [id number]   NM PLUVICTO  ADMINISTRATION Result Date: 12/02/2023 CLINICAL DATA:  89 year old male with prostate cancer. 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 EXAM: NUCLEAR MEDICINE PLUVICTO  INJECTION TECHNIQUE: Infusion: The nuclear medicine technologist and I personally verified the dose activity to be delivered as specified in the written directive, and verified the patient identification via 2 separate methods. Initial flush of the intravenous catheter was performed was sterile saline. The dose syringe was connected to the catheter and the Lu-177 Pluvicto  administered over a 1 to 10 min infusion. Single 10 cc lushes with normal saline follow the dose. No complications were noted. The entire IV tubing, venocatheter, stopcock and syringes was removed in total, placed in a disposal bag and sent for assay of the residual activity, which will be reported at a later time in our EMR by the physics staff. Pressure was applied to the venipuncture site, and a compression bandage placed. Patient monitored for 1 hour following infusion. Radiation Safety personnel were present to perform the discharge survey, as detailed on their documentation. After a short period of  observation, the patient had his IV removed. RADIOPHARMACEUTICALS:  211.9 microcuries Lu-177 PLUVICTO  FINDINGS: Current Infusion: 1 Planned Infusions: 6 Patient presented to nuclear medicine for treatment. Patient accompanied by son. Risk and benefits again explained and consent obtained. The patient's most recent blood counts were reviewed and remains a adequate candidate to proceed with Lu-177 Pluvicto . Hemoglobin equal 11.1. Normal white blood cell count. Normal platelet count. Creatinine equal 1.52 PSA equal greater than 1,500.  PSA equal 607 one  month prior. The patient was situated in an infusion suite with a contact barrier placed under the arm. Intravenous access was established, using sterile technique, and a normal saline infusion from a syringe was started. Micro-dosimetry: The prescribed radiation activity was assayed and confirmed to be within specified tolerance. IMPRESSION: Current Infusion: 1 Planned Infusions: 6 The patient tolerated the infusion well. The patient will return in one month for ongoing care. Electronically Signed   By: Jackquline Boxer M.D.   On: 12/02/2023 15:41   NM Radiologist Eval And Mgmt Result Date: 11/26/2023 EXAM: NEW PATIENT OFFICE VISIT CHIEF COMPLAINT: Metastatic castrate resistant prostate carcinoma Current Pain Level: 1-10 HISTORY OF PRESENT ILLNESS: 89 year old male with castrate resistant metastatic prostate carcinoma. Initial diagnosis in 2022 with Gleason 4+5=9 prostate carcinoma with metastatic lymph nodes and metastatic bone disease. Patient has undergone androgen deprivation. Evidence of progression on androgen deprivation. Recent PSMA PET scan 11/26/2023 demonstrates marked progression radiotracer avid skeletal metastasis. Additional progression  liver metastasis. PSA rapidly increasing and now greater than 1,000. REVIEW OF SYSTEMS: See epic note PHYSICAL EXAMINATION: See epic note ASSESSMENT AND PLAN: See epic note Electronically Signed   By: Jackquline Boxer  M.D.   On: 11/26/2023 16:18   NM PET (PSMA) SKULL TO MID THIGH Result Date: 11/26/2023 CLINICAL DATA:  Prostate carcinoma with biochemical recurrence. EXAM: NUCLEAR MEDICINE PET SKULL BASE TO THIGH TECHNIQUE: 8.5 mCi Flotufolastat (Posluma ) was injected intravenously. Full-ring PET imaging was performed from the skull base to thigh after the radiotracer. CT data was obtained and used for attenuation correction and anatomic localization. COMPARISON:  PSA PET scan 430 1,020 FINDINGS: NECK No radiotracer activity in neck lymph nodes. Incidental CT finding: None. CHEST No radiotracer accumulation within mediastinal or hilar lymph nodes. No suspicious pulmonary nodules on the CT scan. Incidental CT finding: None. ABDOMEN/PELVIS Prostate: No abnormal activity in the prostatectomy bed. Lymph nodes: No abnormal radiotracer accumulation within pelvic or abdominal nodes. Liver: There are several new radiotracer avid lymph nodes within the liver. Previously there were 2 or 3 discrete lesions. Now there are approximately 20 small lesions. The most intense lesion is in the lateral segment LEFT hepatic lobe with SUV max equal 50 on image 91. Incidental CT finding: Benign renal cysts. The RIGHT kidney is smaller than the LEFT. Atherosclerotic calcification of the aorta. SKELETON There is significant progression of skeletal metastasis. Previously there are proximally 50 lesions. Now the lesions number approximately 200 lesions . Essentially every vertebral body is involved. Extensive activity within the LEFT and RIGHT femurs. Multiple rib lesions. Bilateral shoulder girdles involved. Example lesion at L1 with SUV max equal 68.4. Interval internal fixation of the LEFT proximal femur. Prior fixation the RIGHT femur. IMPRESSION: 1. Significant progression of skeletal metastasis. 2. Significant progression of hepatic metastasis. 3. No evidence of local recurrence in the prostatectomy bed. 4.  Aortic Atherosclerosis (ICD10-I70.0).  Electronically Signed   By: Jackquline Boxer M.D.   On: 11/26/2023 15:26   XR FEMUR MIN 2 VIEWS LEFT Result Date: 11/18/2023 X-rays of the left femur from 11/18/2023 were independently reviewed and interpreted, showing displaced intertrochanteric femur fracture which appears unchanged in terms of displacement and alignment from prior films on 10/14/2023.  There is a long cephalomedullary rod in place spanning the fracture.  The interlocking screws are without any lucency around them.  There is no lucency seen around the lag screws.  No new fracture seen.  XR Lumbar Spine 2-3 Views Result Date: 11/18/2023 XRs of the lumbar spine from 11/18/2023 were independent reviewed and interpreted, showing disc height loss at L4/5 and L5/S1.  No spondylolisthesis seen.  Compression fracture seen at L2.  No other fracture seen.  No dislocation seen.

## 2023-12-14 NOTE — Progress Notes (Signed)
  Progress Note   Patient: Marco [name] FMW:[id number] DOB: 1934/04/13 DOA: 12/08/2023     6 DOS: the patient was seen and examined on 12/14/2023 at 8:36AM and 3:00PM      Brief hospital course: 89 y.o. M with metastatic prostate CA, DM, HTN, CKD IIIb baseline 1.3-1.5 recent hip fracture last May, hypothyroidism, and prior amaurosis fugax who presented with acute on chronic weakness and falls.  In the ER, found to have AKI and stroke.     Assessment and Plan: Acute renal failure Urine output good, but creatinine trending up again.  I suspect some of this is related to anemia. - Trend Hgb    Acute left PCA stroke - Continue Lipitor -Hold Eliquis  given worsening anemia  Progressive anemia Since starting Eliquis , his hemoglobin is dropped from baseline of 11.6 down to 8.2 today.  He appears more listless and pale.  He has not had any bleeding observed or reported.  He had anemia requiring transfusions in May when he had his hip repair but that was postoperative.  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 - Continue levothyroxine , dose increased  Diabetes Glucose controlled - Stop metformin  given renal function - Continue sliding scale correction insulin        Subjective: Only ate a few bites of food today.  Quite sluggish and pale all day.  No diarrhea, no melena, no vomiting.     Physical Exam: BP (!) 104/57 (BP Location: Right Arm)   Pulse 87   Temp 97.8 F (36.6 C) (Oral)   Resp 18   Ht 5' 8 (1.727 m)   Wt 57.7  kg   SpO2 99%   BMI 19.34 kg/m   Frail elderly adult male, lying in bed, appears weak and tired Heart rate slightly up, no murmurs, diffuse nonpitting edema/anasarca, JVP not visible Respiratory rate slow shallow, lung sounds diminished, no rales or wheezes Abdomen without grimace to palpation or mass Attention diminished, does not respond well to me today, opens eyes sluggishly then falls back asleep, severe generalized weakness    Data Reviewed: Basic metabolic panel shows worsening acidosis, worsening renal function CBC shows worsening anemia    Family Communication: Son at bedside, daughter by phone    Disposition: Status is: Inpatient         Author: Lonni SHAUNNA Dalton, MD 12/14/2023 4:01 PM  For on call review [email or web address].

## 2023-12-14 NOTE — Assessment & Plan Note (Signed)
 Discussed as above. Recommend hospice care

## 2023-12-14 NOTE — IPAL (Signed)
  Interdisciplinary Goals of Care Family Meeting   Date carried out: 12/14/2023  Location of the meeting: Bedside  Member's involved: Physician and Family Member or next of kin son Ollis Rower Power of Attorney or Environmental health practitioner: Majority of adult children    Discussion: We discussed goals of care for [employer] .  Family have discussed, children are in agreement, they would not want CPR or life support.  They would want non-invasive medical interventions.  If escalation of care to ICU were necessary, they may want discussion of potential outcomes before deciding.  Code status:   Code Status: Limited: Do not attempt resuscitation (DNR) -DNR-LIMITED -Do Not Intubate/DNI    Disposition: SNF/LTAC  Time spent for the meeting: 15 minutes    Lonni SHAUNNA Dalton, MD  12/14/2023, 3:22 PM

## 2023-12-14 NOTE — Assessment & Plan Note (Signed)
 Waxing waning mental status and persistent weakness Currently on anticoagulation

## 2023-12-14 NOTE — Telephone Encounter (Signed)
 Patient Product/process development scientist completed.    The patient is insured through [employer]. Patient has Medicare and is not eligible for a copay card, but may be able to apply for patient assistance or Medicare RX Payment Plan (Patient Must reach out to their plan, if eligible for payment plan), if available.    Ran test claim for Eliquis  5 mg and the current 30 day co-pay is $0.00.   This test claim was processed through [facility]- copay amounts may vary at other pharmacies due to pharmacy/plan contracts, or as the patient moves through the different stages of their insurance plan.     Reyes Sharps, CPHT Pharmacy Technician III Certified Patient Advocate [facility] Patient Advocate Team Direct Number: [phone]  Fax: [phone]

## 2023-12-14 NOTE — Assessment & Plan Note (Addendum)
 Secondary to prostate cancer

## 2023-12-15 DIAGNOSIS — C61 Malignant neoplasm of prostate: Secondary | ICD-10-CM | POA: Diagnosis not present

## 2023-12-15 DIAGNOSIS — N179 Acute kidney failure, unspecified: Secondary | ICD-10-CM | POA: Diagnosis not present

## 2023-12-15 DIAGNOSIS — Z7189 Other specified counseling: Secondary | ICD-10-CM | POA: Diagnosis not present

## 2023-12-15 DIAGNOSIS — D6182 Myelophthisis: Secondary | ICD-10-CM | POA: Diagnosis not present

## 2023-12-15 LAB — GLUCOSE, CAPILLARY
Glucose-Capillary: 132 mg/dL — ABNORMAL HIGH (ref 70–99)
Glucose-Capillary: 147 mg/dL — ABNORMAL HIGH (ref 70–99)
Glucose-Capillary: 169 mg/dL — ABNORMAL HIGH (ref 70–99)
Glucose-Capillary: 142 mg/dL — ABNORMAL HIGH (ref 70–99)

## 2023-12-15 LAB — CBC
HCT: 27.1 % — ABNORMAL LOW (ref 39.0–52.0)
Hemoglobin: 8.8 g/dL — ABNORMAL LOW (ref 13.0–17.0)
MCH: 28.7 pg (ref 26.0–34.0)
MCHC: 32.5 g/dL (ref 30.0–36.0)
MCV: 88.3 fL (ref 80.0–100.0)
Platelets: 208 K/uL (ref 150–400)
RBC: 3.07 MIL/uL — ABNORMAL LOW (ref 4.22–5.81)
RDW: 15.2 % (ref 11.5–15.5)
WBC: 5.8 K/uL (ref 4.0–10.5)
nRBC: 0 % (ref 0.0–0.2)

## 2023-12-15 LAB — COMPREHENSIVE METABOLIC PANEL WITH GFR
ALT: 20 U/L (ref 0–44)
AST: 28 U/L (ref 15–41)
Albumin: 2.1 g/dL — ABNORMAL LOW (ref 3.5–5.0)
Alkaline Phosphatase: 107 U/L (ref 38–126)
Anion gap: 10 (ref 5–15)
BUN: 31 mg/dL — ABNORMAL HIGH (ref 8–23)
CO2: 17 mmol/L — ABNORMAL LOW (ref 22–32)
Calcium: 8.7 mg/dL — ABNORMAL LOW (ref 8.9–10.3)
Chloride: 111 mmol/L (ref 98–111)
Creatinine, Ser: 2.74 mg/dL — ABNORMAL HIGH (ref 0.61–1.24)
GFR, Estimated: 22 mL/min — ABNORMAL LOW (ref >60)
Glucose, Bld: 162 mg/dL — ABNORMAL HIGH (ref 70–99)
Potassium: 4.2 mmol/L (ref 3.5–5.1)
Sodium: 138 mmol/L (ref 135–145)
Total Bilirubin: 0.8 mg/dL (ref 0.0–1.2)
Total Protein: 5.5 g/dL — ABNORMAL LOW (ref 6.5–8.1)

## 2023-12-15 LAB — IRON AND TIBC
Iron: 30 ug/dL — ABNORMAL LOW (ref 45–182)
Saturation Ratios: 17 % — ABNORMAL LOW (ref 17.9–39.5)
TIBC: 182 ug/dL — ABNORMAL LOW (ref 250–450)
UIBC: 152 ug/dL

## 2023-12-15 LAB — FERRITIN: Ferritin: 941 ng/mL — ABNORMAL HIGH (ref 24–336)

## 2023-12-15 MED ORDER — LACTATED RINGERS IV SOLN: INTRAVENOUS | Status: AC

## 2023-12-15 MED ORDER — APIXABAN 5 MG PO TABS
5.0000 mg | ORAL_TABLET | Freq: Two times a day (BID) | ORAL | Status: DC
  Administered 2023-12-15 – 2023-12-17 (×9): 5 mg via ORAL
  Filled 2023-12-15 (×5): qty 1

## 2023-12-15 NOTE — Plan of Care (Signed)
 Problem: Education: Goal: Ability to describe self-care measures that may prevent or decrease complications (Diabetes Survival Skills Education) will improve 12/15/2023 0524 by Baldwin Doran BIRCH, RN Outcome: Not Progressing 12/15/2023 0438 by Baldwin Doran BIRCH, RN Outcome: Not Progressing Goal: Individualized Educational Video(s) 12/15/2023 0524 by Baldwin Doran BIRCH, RN Outcome: Not Progressing 12/15/2023 0438 by Baldwin Doran BIRCH, RN Outcome: Not Progressing   Problem: Coping: Goal: Ability to adjust to condition or change in health will improve 12/15/2023 0524 by Baldwin Doran BIRCH, RN Outcome: Not Progressing 12/15/2023 0438 by Baldwin Doran BIRCH, RN Outcome: Not Progressing   Problem: Fluid Volume: Goal: Ability to maintain a balanced intake and output will improve 12/15/2023 0524 by Baldwin Doran BIRCH, RN Outcome: Not Progressing 12/15/2023 0438 by Baldwin Doran BIRCH, RN Outcome: Not Progressing   Problem: Health Behavior/Discharge Planning: Goal: Ability to identify and utilize available resources and services will improve 12/15/2023 0524 by Baldwin Doran BIRCH, RN Outcome: Not Progressing 12/15/2023 0438 by Baldwin Doran BIRCH, RN Outcome: Not Progressing Goal: Ability to manage health-related needs will improve 12/15/2023 0524 by Baldwin Doran BIRCH, RN Outcome: Not Progressing 12/15/2023 0438 by Baldwin Doran BIRCH, RN Outcome: Not Progressing   Problem: Metabolic: Goal: Ability to maintain appropriate glucose levels will improve 12/15/2023 0524 by Baldwin Doran BIRCH, RN Outcome: Not Progressing 12/15/2023 0438 by Baldwin Doran BIRCH, RN Outcome: Not Progressing   Problem: Nutritional: Goal: Maintenance of adequate nutrition will improve 12/15/2023 0524 by Baldwin Doran BIRCH, RN Outcome: Not Progressing 12/15/2023 0438 by Baldwin Doran BIRCH, RN Outcome: Not Progressing Goal: Progress toward achieving an optimal weight will improve 12/15/2023 0524 by Baldwin Doran BIRCH,  RN Outcome: Not Progressing 12/15/2023 0438 by Baldwin Doran BIRCH, RN Outcome: Not Progressing   Problem: Skin Integrity: Goal: Risk for impaired skin integrity will decrease 12/15/2023 0524 by Baldwin Doran BIRCH, RN Outcome: Not Progressing 12/15/2023 0438 by Baldwin Doran BIRCH, RN Outcome: Not Progressing   Problem: Tissue Perfusion: Goal: Adequacy of tissue perfusion will improve 12/15/2023 0524 by Baldwin Doran BIRCH, RN Outcome: Not Progressing 12/15/2023 0438 by Baldwin Doran BIRCH, RN Outcome: Not Progressing   Problem: Education: Goal: Knowledge of General Education information will improve Description: Including pain rating scale, medication(s)/side effects and non-pharmacologic comfort measures 12/15/2023 0524 by Baldwin Doran BIRCH, RN Outcome: Not Progressing 12/15/2023 0438 by Baldwin Doran BIRCH, RN Outcome: Not Progressing   Problem: Health Behavior/Discharge Planning: Goal: Ability to manage health-related needs will improve 12/15/2023 0524 by Baldwin Doran BIRCH, RN Outcome: Not Progressing 12/15/2023 0438 by Baldwin Doran BIRCH, RN Outcome: Not Progressing   Problem: Clinical Measurements: Goal: Ability to maintain clinical measurements within normal limits will improve 12/15/2023 0524 by Baldwin Doran BIRCH, RN Outcome: Not Progressing 12/15/2023 0438 by Baldwin Doran BIRCH, RN Outcome: Not Progressing Goal: Will remain free from infection 12/15/2023 0524 by Baldwin Doran BIRCH, RN Outcome: Not Progressing 12/15/2023 0438 by Baldwin Doran BIRCH, RN Outcome: Not Progressing Goal: Diagnostic test results will improve 12/15/2023 0524 by Baldwin Doran BIRCH, RN Outcome: Not Progressing 12/15/2023 0438 by Baldwin Doran BIRCH, RN Outcome: Not Progressing Goal: Respiratory complications will improve 12/15/2023 0524 by Baldwin Doran BIRCH, RN Outcome: Not Progressing 12/15/2023 0438 by Baldwin Doran BIRCH, RN Outcome: Not Progressing Goal: Cardiovascular complication will be  avoided 12/15/2023 0524 by Baldwin Doran BIRCH, RN Outcome: Not Progressing 12/15/2023 0438 by Baldwin Doran BIRCH, RN Outcome: Not Progressing   Problem: Activity: Goal: Risk for activity intolerance will decrease 12/15/2023 0524 by Baldwin Doran BIRCH, RN Outcome: Not Progressing 12/15/2023  [id number] by Baldwin Doran BIRCH, RN Outcome: Not Progressing   Problem: Nutrition: Goal: Adequate nutrition will be maintained 12/15/2023 0524 by Baldwin Doran BIRCH, RN Outcome: Not Progressing 12/15/2023 0438 by Baldwin Doran BIRCH, RN Outcome: Not Progressing   Problem: Coping: Goal: Level of anxiety will decrease 12/15/2023 0524 by Baldwin Doran BIRCH, RN Outcome: Not Progressing 12/15/2023 0438 by Baldwin Doran BIRCH, RN Outcome: Not Progressing   Problem: Elimination: Goal: Will not experience complications related to bowel motility 12/15/2023 0524 by Baldwin Doran BIRCH, RN Outcome: Not Progressing 12/15/2023 0438 by Baldwin Doran BIRCH, RN Outcome: Not Progressing Goal: Will not experience complications related to urinary retention 12/15/2023 0524 by Baldwin Doran BIRCH, RN Outcome: Not Progressing 12/15/2023 0438 by Baldwin Doran BIRCH, RN Outcome: Not Progressing   Problem: Pain Managment: Goal: General experience of comfort will improve and/or be controlled 12/15/2023 0524 by Baldwin Doran BIRCH, RN Outcome: Not Progressing 12/15/2023 0438 by Baldwin Doran BIRCH, RN Outcome: Not Progressing   Problem: Safety: Goal: Ability to remain free from injury will improve 12/15/2023 0524 by Baldwin Doran BIRCH, RN Outcome: Not Progressing 12/15/2023 0438 by Baldwin Doran BIRCH, RN Outcome: Not Progressing   Problem: Skin Integrity: Goal: Risk for impaired skin integrity will decrease 12/15/2023 0524 by Baldwin Doran BIRCH, RN Outcome: Not Progressing 12/15/2023 0438 by Baldwin Doran BIRCH, RN Outcome: Not Progressing   Problem: Education: Goal: Knowledge of disease or condition will improve 12/15/2023 0524  by Baldwin Doran BIRCH, RN Outcome: Not Progressing 12/15/2023 0438 by Baldwin Doran BIRCH, RN Outcome: Not Progressing Goal: Knowledge of secondary prevention will improve (MUST DOCUMENT ALL) 12/15/2023 0524 by Baldwin Doran BIRCH, RN Outcome: Not Progressing 12/15/2023 0438 by Baldwin Doran BIRCH, RN Outcome: Not Progressing Goal: Knowledge of patient specific risk factors will improve (DELETE if not current risk factor) 12/15/2023 0524 by Baldwin Doran BIRCH, RN Outcome: Not Progressing 12/15/2023 0438 by Baldwin Doran BIRCH, RN Outcome: Not Progressing   Problem: Ischemic Stroke/TIA Tissue Perfusion: Goal: Complications of ischemic stroke/TIA will be minimized 12/15/2023 0524 by Baldwin Doran BIRCH, RN Outcome: Not Progressing 12/15/2023 0438 by Baldwin Doran BIRCH, RN Outcome: Not Progressing   Problem: Coping: Goal: Will verbalize positive feelings about self 12/15/2023 0524 by Baldwin Doran BIRCH, RN Outcome: Not Progressing 12/15/2023 0438 by Baldwin Doran BIRCH, RN Outcome: Not Progressing Goal: Will identify appropriate support needs 12/15/2023 0524 by Baldwin Doran BIRCH, RN Outcome: Not Progressing 12/15/2023 0438 by Baldwin Doran BIRCH, RN Outcome: Not Progressing   Problem: Health Behavior/Discharge Planning: Goal: Ability to manage health-related needs will improve 12/15/2023 0524 by Baldwin Doran BIRCH, RN Outcome: Not Progressing 12/15/2023 0438 by Baldwin Doran BIRCH, RN Outcome: Not Progressing Goal: Goals will be collaboratively established with patient/family 12/15/2023 0524 by Baldwin Doran BIRCH, RN Outcome: Not Progressing 12/15/2023 0438 by Baldwin Doran BIRCH, RN Outcome: Not Progressing   Problem: Self-Care: Goal: Ability to participate in self-care as condition permits will improve 12/15/2023 0524 by Baldwin Doran BIRCH, RN Outcome: Not Progressing 12/15/2023 0438 by Baldwin Doran BIRCH, RN Outcome: Not Progressing Goal: Verbalization of feelings and concerns over  difficulty with self-care will improve 12/15/2023 0524 by Baldwin Doran BIRCH, RN Outcome: Not Progressing 12/15/2023 0438 by Baldwin Doran BIRCH, RN Outcome: Not Progressing Goal: Ability to communicate needs accurately will improve 12/15/2023 0524 by Baldwin Doran BIRCH, RN Outcome: Not Progressing 12/15/2023 0438 by Baldwin Doran BIRCH, RN Outcome: Not Progressing   Problem: Nutrition: Goal: Risk of aspiration will decrease 12/15/2023 0524 by Baldwin Doran BIRCH, RN Outcome: Not Progressing  12/15/2023 0438 by Baldwin Bright D, RN Outcome: Not Progressing Goal: Dietary intake will improve 12/15/2023 0524 by Baldwin Bright BIRCH, RN Outcome: Not Progressing 12/15/2023 0438 by Baldwin Bright BIRCH, RN Outcome: Not Progressing

## 2023-12-15 NOTE — Progress Notes (Signed)
 Progress Note   Patient: Marco [name] FMW:[id number] DOB: 06/09/1933 DOA: 12/08/2023     7 DOS: the patient was seen and examined on 12/15/2023 at 9:09AM and 2:15PM      Brief hospital course: 89 y.o. M with metastatic prostate CA, DM, HTN, CKD IIIb baseline 1.3-1.5 recent hip fracture last May, hypothyroidism, and prior amaurosis fugax who presented with acute on chronic weakness and falls.  Evidently recently had new infusion of a prostate-specific membrane antigen radioligand Pluvicto , after which family noticed he got weaker, stopped eating or walking, and could not get up.  Sent to the ER and found to have AKI and stroke.    Assessment and Plan: Acute renal failure This occurred after starting Pluvicto , likely medication related ATN.  Urinalysis unremarkable.  Initially improved slightly with IV fluids, since then creatinine has stalled at 2.7. Urine output good - Resume IV fluids given worsening in the last 48 hours - Strict ins and outs   Acute left PCA stroke MRI brain confirmed left PCA large infarct with thalamus and punctate right cerebella stroke.  Likely embolic from hypercoagulable state per neurology.     Echocardiogram normal, duplex ultrasound showed left femoral DVT, TCD negative - Continue Eliquis  - Continue Lipitor - Plan for SNF   Left femoral DVT Started on heparin  and transition to Eliquis . - Continue Eliquis   Progressive anemia Since starting anticoagulation, the patient's hemoglobin dropped from baseline 11.6 down to 8.2.  In the last 24 hours this is rebounded.  There is been no bleeding observed or reported.  He had anemia requiring transfusion in May when he had his hip repair, but no history of GI bleeding.  Not a candidate for endoscopy. 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 - Recommend palliative  care to follow at SNF  Pathologic compression fracture of the thoracic spine - PT/OT  Acute metabolic encephalopathy At baseline, patient has no significant dementia.  Here he has waxing and waning delirium/disorientation due to stroke, renal failure. - Delirium precautions.  Hypothyroidism TSH ~10 on admission, levothyroxine  dose increased here - Continue LT4  Diabetes Glucose controlled - Stop metformin  given renal function - Continue SS correction insulin         Subjective: More confused today again per family.  No fever, no melena, no vomiting.     Physical Exam: BP 117/67 (BP Location: Right Arm)   Pulse 99   Temp 98.9 F (37.2 C) (Oral)   Resp 16   Ht 5' 8 (1.727 m)   Wt 57.7 kg   SpO2 100%   BMI 19.34 kg/m   Adult male, elderly, lying in bed, appears weak and sluggish Tachycardic, regular, no murmurs, diffuse nonpitting edema/anasarca Respiratory rate slow, shallow, lung sounds diminished, no rales or wheezes Abdomen soft, no grimace to palpation, no mass Attention diminished, does not respond to questions well today, oriented to self only, does not know where he is, opens his eyes sluggishly then falls back asleep, severe generalized weakness, I cannot tell focality of the weakness    Data Reviewed: Basic metabolic panel shows creatinine worse to 2.7, bicarb stable at 17 CBC shows hemoglobin stable at 8.8     Family Communication: Son and daughter    Disposition: Status is: Inpatient 89 yo M with metastatic prostate cancer admitted with stroke and renal failure in the setting of Pluvicto   Renal failure does  not appear to be improving, and his overall prognosis is uncertain.  However, he was ambulatory with min assistance prior to admission, and will require signficant rehab to return to prior function, possibly later this week        Author: Lonni SHAUNNA Dalton, MD 12/15/2023 4:54 PM  For on call review [email or web address].

## 2023-12-15 NOTE — Plan of Care (Signed)

## 2023-12-15 NOTE — TOC Progression Note (Signed)
 Transition of Care [facility]) - Progression Note    Patient Details  Name: Marco [name] MRN: [id number] Date of Birth: 1934/03/10  Transition [facility] CM/SW Contact  Almarie CHRISTELLA Goodie, [place] Phone Number: 12/15/2023, 3:29 PM  Clinical Narrative:   CSW updated by MD that patient's son now asking about [facility]. CSW contacted [facility], confirmed that son had reached out to them to ask about beds. CSW sent referral, they will review and update CSW on offer. CSW to follow.    Expected Discharge Plan: Skilled Nursing Facility Barriers to Discharge: Continued Medical Work up               Expected Discharge Plan and Services In-house Referral: Clinical Social Work Discharge Planning Services: CM Consult Post Acute Care Choice: Skilled Nursing Facility Living arrangements for the past 2 months: Single Family Home                                       Social Drivers of Health (SDOH) Interventions SDOH Screenings   Food Insecurity: No Food Insecurity (12/08/2023)  Housing: Low Risk  (12/08/2023)  Transportation Needs: No Transportation Needs (12/08/2023)  Utilities: Not At Risk (12/08/2023)  Alcohol  Screen: Low Risk  (09/04/2021)  Depression (PHQ2-9): Low Risk  (11/23/2023)  Financial Resource Strain: Low Risk  (10/30/2021)  Physical Activity: Inactive (10/25/2020)  Social Connections: Moderately Isolated (12/08/2023)  Stress: No Stress Concern Present (09/04/2021)  Tobacco Use: Low Risk  (12/08/2023)    Readmission Risk Interventions     No data to display

## 2023-12-15 NOTE — Plan of Care (Signed)
   Problem: Education: Goal: Ability to describe self-care measures that may prevent or decrease complications (Diabetes Survival Skills Education) will improve Outcome: Not Progressing Goal: Individualized Educational Video(s) Outcome: Not Progressing   Problem: Coping: Goal: Ability to adjust to condition or change in health will improve Outcome: Not Progressing   Problem: Fluid Volume: Goal: Ability to maintain a balanced intake and output will improve Outcome: Not Progressing   Problem: Health Behavior/Discharge Planning: Goal: Ability to identify and utilize available resources and services will improve Outcome: Not Progressing Goal: Ability to manage health-related needs will improve Outcome: Not Progressing   Problem: Metabolic: Goal: Ability to maintain appropriate glucose levels will improve Outcome: Not Progressing   Problem: Nutritional: Goal: Maintenance of adequate nutrition will improve Outcome: Not Progressing Goal: Progress toward achieving an optimal weight will improve Outcome: Not Progressing   Problem: Skin Integrity: Goal: Risk for impaired skin integrity will decrease Outcome: Not Progressing   Problem: Tissue Perfusion: Goal: Adequacy of tissue perfusion will improve Outcome: Not Progressing   Problem: Education: Goal: Knowledge of General Education information will improve Description: Including pain rating scale, medication(s)/side effects and non-pharmacologic comfort measures Outcome: Not Progressing   Problem: Health Behavior/Discharge Planning: Goal: Ability to manage health-related needs will improve Outcome: Not Progressing   Problem: Clinical Measurements: Goal: Ability to maintain clinical measurements within normal limits will improve Outcome: Not Progressing Goal: Will remain free from infection Outcome: Not Progressing Goal: Diagnostic test results will improve Outcome: Not Progressing Goal: Respiratory complications will  improve Outcome: Not Progressing Goal: Cardiovascular complication will be avoided Outcome: Not Progressing   Problem: Activity: Goal: Risk for activity intolerance will decrease Outcome: Not Progressing   Problem: Nutrition: Goal: Adequate nutrition will be maintained Outcome: Not Progressing   Problem: Coping: Goal: Level of anxiety will decrease Outcome: Not Progressing   Problem: Elimination: Goal: Will not experience complications related to bowel motility Outcome: Not Progressing Goal: Will not experience complications related to urinary retention Outcome: Not Progressing   Problem: Pain Managment: Goal: General experience of comfort will improve and/or be controlled Outcome: Not Progressing   Problem: Safety: Goal: Ability to remain free from injury will improve Outcome: Not Progressing   Problem: Skin Integrity: Goal: Risk for impaired skin integrity will decrease Outcome: Not Progressing   Problem: Education: Goal: Knowledge of disease or condition will improve Outcome: Not Progressing Goal: Knowledge of secondary prevention will improve (MUST DOCUMENT ALL) Outcome: Not Progressing Goal: Knowledge of patient specific risk factors will improve (DELETE if not current risk factor) Outcome: Not Progressing   Problem: Ischemic Stroke/TIA Tissue Perfusion: Goal: Complications of ischemic stroke/TIA will be minimized Outcome: Not Progressing   Problem: Coping: Goal: Will verbalize positive feelings about self Outcome: Not Progressing Goal: Will identify appropriate support needs Outcome: Not Progressing   Problem: Health Behavior/Discharge Planning: Goal: Ability to manage health-related needs will improve Outcome: Not Progressing Goal: Goals will be collaboratively established with patient/family Outcome: Not Progressing   Problem: Self-Care: Goal: Ability to participate in self-care as condition permits will improve Outcome: Not Progressing Goal:  Verbalization of feelings and concerns over difficulty with self-care will improve Outcome: Not Progressing Goal: Ability to communicate needs accurately will improve Outcome: Not Progressing   Problem: Nutrition: Goal: Risk of aspiration will decrease Outcome: Not Progressing Goal: Dietary intake will improve Outcome: Not Progressing

## 2023-12-16 DIAGNOSIS — I6340 Cerebral infarction due to embolism of unspecified cerebral artery: Secondary | ICD-10-CM | POA: Diagnosis not present

## 2023-12-16 LAB — COMPREHENSIVE METABOLIC PANEL WITH GFR
ALT: 19 U/L (ref 0–44)
AST: 27 U/L (ref 15–41)
Albumin: 2 g/dL — ABNORMAL LOW (ref 3.5–5.0)
Alkaline Phosphatase: 111 U/L (ref 38–126)
Anion gap: 10 (ref 5–15)
BUN: 30 mg/dL — ABNORMAL HIGH (ref 8–23)
CO2: 18 mmol/L — ABNORMAL LOW (ref 22–32)
Calcium: 8.8 mg/dL — ABNORMAL LOW (ref 8.9–10.3)
Chloride: 110 mmol/L (ref 98–111)
Creatinine, Ser: 2.75 mg/dL — ABNORMAL HIGH (ref 0.61–1.24)
GFR, Estimated: 22 mL/min — ABNORMAL LOW (ref >60)
Glucose, Bld: 178 mg/dL — ABNORMAL HIGH (ref 70–99)
Potassium: 4.1 mmol/L (ref 3.5–5.1)
Sodium: 138 mmol/L (ref 135–145)
Total Bilirubin: 0.9 mg/dL (ref 0.0–1.2)
Total Protein: 5.4 g/dL — ABNORMAL LOW (ref 6.5–8.1)

## 2023-12-16 LAB — CBC
HCT: 26.7 % — ABNORMAL LOW (ref 39.0–52.0)
Hemoglobin: 8.7 g/dL — ABNORMAL LOW (ref 13.0–17.0)
MCH: 28.5 pg (ref 26.0–34.0)
MCHC: 32.6 g/dL (ref 30.0–36.0)
MCV: 87.5 fL (ref 80.0–100.0)
Platelets: 204 K/uL (ref 150–400)
RBC: 3.05 MIL/uL — ABNORMAL LOW (ref 4.22–5.81)
RDW: 15.2 % (ref 11.5–15.5)
WBC: 6.3 K/uL (ref 4.0–10.5)
nRBC: 0 % (ref 0.0–0.2)

## 2023-12-16 LAB — GLUCOSE, CAPILLARY
Glucose-Capillary: 162 mg/dL — ABNORMAL HIGH (ref 70–99)
Glucose-Capillary: 167 mg/dL — ABNORMAL HIGH (ref 70–99)
Glucose-Capillary: 197 mg/dL — ABNORMAL HIGH (ref 70–99)
Glucose-Capillary: 167 mg/dL — ABNORMAL HIGH (ref 70–99)

## 2023-12-16 MED ORDER — SODIUM CHLORIDE 0.9 % IV SOLN: INTRAVENOUS | Status: DC

## 2023-12-16 MED ORDER — ENSURE PLUS HIGH PROTEIN PO LIQD
237.0000 mL | Freq: Two times a day (BID) | ORAL | Status: DC
  Administered 2023-12-17: 237 mL via ORAL

## 2023-12-16 NOTE — Progress Notes (Signed)
 Physical Therapy Treatment Patient Details Name: Marco [name] MRN: [id number] DOB: [birth year] Today's Date: 12/16/2023   History of Present Illness Pt is a 89 y.o. male presenting to [facility] after a fall and inability to walk, found to have subacute L1 fx. 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 Pt also with acute LLE DVT. PMH significant for advanced stage IV prostate cancer with bony mets, Type II DM, L hip fx s/p IM nailing 09/2023, CAD, CKD III   PT Comments  Pt in bed upon arrival with grandson present. Mobility limited by pain in R shoulder and B LE's with movement. Pt with limited vocalizations during session with eyes closed majority of the time. Pt initially required MaxA for seated balance due to posterior lean with progression to CGA. Worked on anterior weight-shifts with pt resisting anterior movement. Attempted to work on reaching for objects, however, pt not following commands. Transferred to recliner via squat-pivot and TotalAx2 using bed pad and gait belt. Continue to recommend <3hrs post acute rehab. Acute PT to follow.    If plan is discharge home, recommend the following: A lot of help with walking and/or transfers;A lot of help with bathing/dressing/bathroom;Assistance with cooking/housework;Direct supervision/assist for medications management;Direct supervision/assist for financial management;Assist for transportation;Help with stairs or ramp for entrance   Can travel by private vehicle     No  Equipment Recommendations  None recommended by PT       Precautions / Restrictions Precautions Precautions: Fall Recall of Precautions/Restrictions: Impaired Precaution/Restrictions Comments: No spinal prec or brace per MD (L1 subacute fx) Restrictions Weight Bearing Restrictions Per Provider Order: No     Mobility  Bed Mobility Overal bed mobility: Needs Assistance Bed  Mobility: Rolling, Sidelying to Sit Rolling: Total assist, +2 for physical assistance, +2 for safety/equipment Sidelying to sit: Total assist, +2 for safety/equipment, +2 for physical assistance, Used rails    General bed mobility comments: TotalAx2 for all aspects with use of bed pad to shift hips    Transfers Overall transfer level: Needs assistance Equipment used: 2 person hand held assist Transfers: Bed to chair/wheelchair/BSC       General transfer comment: TotalAx2 with use of bed pad and gait belt to squat pivot to recliner           Modified Rankin (Stroke Patients Only) Modified Rankin (Stroke Patients Only) Pre-Morbid Rankin Score: Moderately severe disability Modified Rankin: Severe disability     Balance Overall balance assessment: Needs assistance, History of Falls Sitting-balance support: Feet supported, Single extremity supported Sitting balance-Leahy Scale: Poor Sitting balance - Comments: MaxA to CGA for posterior lean. Pt resisting anterior trunk lean Postural control: Posterior lean Standing balance support: Bilateral upper extremity supported, During functional activity, Reliant on assistive device for balance Standing balance-Leahy Scale: Zero Standing balance comment: unable to maintain stand with MaxAx2     Communication Communication Communication: Impaired Factors Affecting Communication: Other (comment) (limited verbalizations)  Cognition Arousal: Alert Behavior During Therapy: Flat affect   PT - Cognitive impairments: Awareness, Memory, Attention, Initiation, Problem solving, Sequencing, Safety/Judgement, Difficult to assess Difficult to assess due to: Impaired communication    PT - Cognition Comments: Intermittent following of commands with pt responding better to grandson giving instructions. Limited vocalizations with pt keeping eyes closed majority of session Following commands: Impaired Following commands impaired: Follows one step  commands inconsistently    Cueing Cueing Techniques: Verbal cues, Gestural  cues, Tactile cues     General Comments General comments (skin integrity, edema, etc.): Grandson present and supportive during session.      Pertinent Vitals/Pain Pain Assessment Pain Assessment: Faces Faces Pain Scale: Hurts even more Pain Location: R shoulder and B LE's with movement Pain Descriptors / Indicators: Discomfort, Crying Pain Intervention(s): Limited activity within patient's tolerance, Monitored during session, Repositioned     PT Goals (current goals can now be found in the care plan section) Acute Rehab PT Goals Patient Stated Goal: unable to state goal PT Goal Formulation: With family Time For Goal Achievement: 12/23/23 Potential to Achieve Goals: Fair Progress towards PT goals: Not progressing toward goals - comment    Frequency    Min 2X/week           Co-evaluation   Reason for Co-Treatment: Complexity of the patient's impairments (multi-system involvement);Necessary to address cognition/behavior during functional activity;For patient/therapist safety;To address functional/ADL transfers PT goals addressed during session: Mobility/safety with mobility;Balance        AM-PAC PT 6 Clicks Mobility   Outcome Measure  Help needed turning from your back to your side while in a flat bed without using bedrails?: Total Help needed moving from lying on your back to sitting on the side of a flat bed without using bedrails?: Total Help needed moving to and from a bed to a chair (including a wheelchair)?: Total Help needed standing up from a chair using your arms (e.g., wheelchair or bedside chair)?: Total Help needed to walk in hospital room?: Total Help needed climbing 3-5 steps with a railing? : Total 6 Click Score: 6    End of Session Equipment Utilized During Treatment: Gait belt Activity Tolerance: Patient limited by pain Patient left: in chair;with call bell/phone within  reach;with chair alarm set;with family/visitor present;Other (comment) (lift pad) Nurse Communication: Mobility status;Need for lift equipment PT Visit Diagnosis: Unsteadiness on feet (R26.81);Other abnormalities of gait and mobility (R26.89);Muscle weakness (generalized) (M62.81);History of falling (Z91.81)     Time: 8658-8595 PT Time Calculation (min) (ACUTE ONLY): 23 min  Charges:    $Therapeutic Activity: 8-22 mins PT General Charges $$ ACUTE PT VISIT: 1 Visit                     Kate ORN, PT, DPT Secure Chat Preferred  Rehab Office [phone]    Kate BRAVO Wendolyn 12/16/2023, 2:25 PM

## 2023-12-16 NOTE — Progress Notes (Signed)
 Initial Nutrition Assessment  DOCUMENTATION CODES:  Severe malnutrition in context of acute illness/injury  INTERVENTION:  Continue current diet as recommended by SLP Feeding assistance Add extra puree pineapple to trays Ensure Plus High Protein po BID, each supplement provides 350 kcal and 20 grams of protein Continue supplements on tray with puree diet  NUTRITION DIAGNOSIS:  Severe Malnutrition related to acute illness (recent hospitalization, new CVA) as evidenced by moderate muscle depletion, percent weight loss (9.1% x 3 months).  GOAL:  Patient will meet greater than or equal to 90% of their needs  MONITOR:  PO intake, Supplement acceptance, Labs  REASON FOR ASSESSMENT:  Malnutrition Screening Tool    ASSESSMENT:  Pt with hx of DM type 2, HLD, HTN, and stage IV prostate cancer presented to ED with weakness and falls at home. Imaging in ED showed late subacute infarct of the left occipital lobe as well as new new subacute infarcts of the lateral thalamus.  8/5 - presented to ED 8/6 - SLP BSE, full liquids 8/7 - BSE, DYS1, thin  Pt resting in bedside chair at the time of assessment. Did not open his eyes or interact, lethargic. Several family members at bedside able to provide a hx. Reports that pt's intake has been variable. Reports he ate ~50% of his lunch and RN had been about to get him to eat ~30% of his breakfast. Prior to admission, family reports that pt was eating normal consistencies and regularly consumed 3 meals a day. Regularly drank and ensure or a premier protein at home. Family reports that since his fall and hip fracture back in May, he has gotten weaker as he has not been as ambulatory. They have also noticed muscle and overall weight loss since this time.   Pt currently has a bed offer to a rehab facility and is set to discharge tomorrow. Family does express some concern over pt's intake as today he is more lethargic and they have been only able to get pt to  take in liquids via spoon. Pt has only had about 4 oz of liquids so far today. Family did inquire about TPN and a feeding tube. Did express that pt is not an appropriate candidate for TPN as he has a functioning gut. Did discuss that they could speak to pt's PCP about a g-tube placement. However, did discuss that while a tube may increase length of life, it would likely not add quality time to his life. Did encourage them to discuss as a family and consider pt's co-morbidities and current clinical picture.  Admit weight: 60 kg Current weight: 57.7 kg  9.1% weight loss x 3 months, severe (5/24-8/5)  Average Meal Intake: 8/9-8/13: 63% intake x 8 recorded meals  Nutritionally Relevant Medications: Scheduled Meds:  atorvastatin   40 mg Oral Daily   Ensure Plus High Protein  237 mL Oral BID BM   insulin  aspart  0-9 Units Subcutaneous TID WC   PRN Meds: bisacodyl , ondansetron  (ZOFRAN ) IV, senna-docusate  Labs Reviewed: BUN 30, creatinine 2.75 CBG ranges from 132-169 mg/dL over the last 24 hours HgbA1c 5.9% (8/5)  NUTRITION - FOCUSED PHYSICAL EXAM: Flowsheet Row Most Recent Value  Orbital Region Severe depletion  Upper Arm Region Mild depletion  Thoracic and Lumbar Region Mild depletion  Buccal Region Moderate depletion  Temple Region Severe depletion  Clavicle Bone Region Mild depletion  Clavicle and Acromion Bone Region Moderate depletion  Scapular Bone Region Mild depletion  Dorsal Hand Severe depletion  Patellar Region Moderate depletion  Anterior Thigh Region Moderate depletion  Posterior Calf Region Mild depletion  Edema (RD Assessment) Mild  [BLE]  Hair Reviewed  Eyes Reviewed  Mouth Reviewed  Skin Reviewed  Nails Reviewed    Diet Order:   Diet Order             DIET - DYS 1 Room service appropriate? No; Fluid consistency: Thin  Diet effective now                   EDUCATION NEEDS:  Education needs have been addressed  Skin:  Skin Assessment: Reviewed RN  Assessment  Last BM:  8/10  Height:  Ht Readings from Last 1 Encounters:  12/08/23 5' 8 (1.727 m)    Weight:  Wt Readings from Last 1 Encounters:  12/08/23 57.7 kg    Ideal Body Weight:  70 kg  BMI:  Body mass index is 19.34 kg/m.  Estimated Nutritional Needs:  Kcal:  1600-1800 kcal/d Protein:  75-90g/d Fluid:  1.8L/d    Vernell Lukes, RD, LDN, CNSC Registered Dietitian II Please reach out via secure chat

## 2023-12-16 NOTE — TOC Progression Note (Signed)
 Transition of Care [facility]) - Progression Note    Patient Details  Name: Marco [name] MRN: [id number] Date of Birth: [birth year]  Transition of Care [facility] CM/SW Contact  Almarie CHRISTELLA [place]ie, [place] Phone Number: 12/16/2023, 10:44 AM  Clinical Narrative:   CSW updated by [facility] that they are able to admit the patient. CSW met with patient's DIL and grandson at bedside to discuss options, and confirmed they want to choose [facility]. CSW answered questions and updated that bed will be available tomorrow, if patient is medically stable. Family in agreement. CSW updated [facility] that family would like to accept. CSW to follow.    Expected Discharge Plan: Skilled Nursing Facility Barriers to Discharge: Continued Medical Work up               Expected Discharge Plan and Services In-house Referral: Clinical Social Work Discharge Planning Services: CM Consult Post Acute Care Choice: Skilled Nursing Facility Living arrangements for the past 2 months: Single Family Home                                       Social Drivers of Health (SDOH) Interventions SDOH Screenings   Food Insecurity: No Food Insecurity (12/08/2023)  Housing: Low Risk  (12/08/2023)  Transportation Needs: No Transportation Needs (12/08/2023)  Utilities: Not At Risk (12/08/2023)  Alcohol  Screen: Low Risk  (09/04/2021)  Depression (PHQ2-9): Low Risk  (11/23/2023)  Financial Resource Strain: Low Risk  (10/30/2021)  Physical Activity: Inactive (10/25/2020)  Social Connections: Moderately Isolated (12/08/2023)  Stress: No Stress Concern Present (09/04/2021)  Tobacco Use: Low Risk  (12/08/2023)    Readmission Risk Interventions     No data to display

## 2023-12-16 NOTE — Plan of Care (Signed)

## 2023-12-16 NOTE — Progress Notes (Signed)
 PROGRESS NOTE    Marco [name]  FMW:[id number] DOB: [birth year] DOA: 12/08/2023 PCP: Amon Aloysius BRAVO, MD   Brief Narrative: This 89 yrs old Male with metastatic prostate CA, DM, HTN, CKD IIIb baseline 1.3-1.5 , recent hip fracture last May, hypothyroidism, and prior amaurosis fugax who presented with acute on chronic weakness and falls.Evidently recently had new infusion of a prostate-specific membrane antigen radioligand Pluvicto , after which family noticed he got weaker, stopped eating or walking, and could not get up.  Sent to the ER and found to have AKI and stroke.  Assessment & Plan:   Principal Problem:   Stroke [facility]) Active Problems:   Prostate cancer metastatic to bone (HCC)   AKI (acute kidney injury) (HCC)   Myelophthisic anemia (HCC)   Goals of care, counseling/discussion  Acute kidney injury: This occurred after starting Pluvicto , likely medication related ATN.   Urinalysis unremarkable.  Initially improved slightly with IV fluids, since then creatinine has stalled at 2.7. Urine output good. Resumed IV fluids given worsening in the last 48 hours Monitor Strict ins and outs     Acute left PCA stroke: MRI brain confirmed left PCA large infarct with thalamus and punctate right cerebella stroke.   Likely embolic from hypercoagulable state per neurology.    Echocardiogram normal, duplex ultrasound showed left femoral DVT, TCD negative Continue Eliquis  Continue Lipitor Plan for SNF     Left femoral DVT: Started on heparin  and transition to Eliquis . Continue Eliquis    Progressive anemia: Since starting anticoagulation, the patient's hemoglobin dropped from baseline 11.6 down to 8.2.   In the last 24 hours this is rebounded.  There is been no bleeding observed or reported.   He had anemia requiring transfusion in May when he had his hip repair, but no history of GI bleeding.   Not a candidate for endoscopy. 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 Recommend palliative care to follow at SNF   Pathologic compression fracture of the thoracic spine:  PT/OT eval recommended SNF   Acute metabolic encephalopathy: At baseline, patient has no significant dementia.   Here he has waxing and waning delirium/disorientation due to stroke, renal failure. - Delirium precautions.   Hypothyroidism: Continue levothyroxine .   Diabetes Mellitus II: Glucose controlled Stop metformin  given renal function Continue SS correction insulin .      DVT prophylaxis: Eliquis  Code Status: DNR Family Communication: DIL and grandson Disposition Plan:    Status is: Inpatient Remains inpatient appropriate because: Severity of illness.     Consultants:  Palliative care  Procedures: Antimicrobials:  Anti-infectives (From admission, onward)    None      Subjective: Patient was seen and examined at bedside.  Overnight events noted. Patient is severely deconditioned and partially responsive to commands.  Objective: Vitals:   12/16/23 0420 12/16/23 0808 12/16/23 1126 12/16/23 1150  BP: 123/63 131/76 135/62 135/62  Pulse: (!) 111 98 97 97  Resp: 16 18 18 18   Temp: 97.7 F (36.5 C) 98.6 F (37 C) 98.4 F (36.9 C) 98.4 F (36.9 C)  TempSrc:  Oral Oral Oral  SpO2: 100% 100% 100% 100%  Weight:      Height:        Intake/Output Summary (Last 24 hours) at 12/16/2023 1239 Last data filed at 12/16/2023 0900 Gross per 24 hour  Intake 288.66 ml  Output 2975 ml  Net -2686.34 ml  Filed Weights   12/08/23 1206 12/08/23 2046  Weight: 60 kg 57.7 kg    Examination:  General exam: Appears calm and comfortable , severely deconditioned, not in any acute distress. Respiratory system: Clear to auscultation. Respiratory effort normal.  RR 16 Cardiovascular system: S1 & S2 heard, RRR. No JVD, murmurs, rubs, gallops or clicks.   Gastrointestinal system: Abdomen is non distended, soft and non tender. Normal bowel sounds heard. Central nervous system: Alert and oriented x 1. No focal neurological deficits. Extremities: No edema, no cyanosis, no clubbing. Skin: No rashes, lesions or ulcers Psychiatry:. Mood & affect appropriate.    Data Reviewed: I have personally reviewed following labs and imaging studies  CBC: Recent Labs  Lab 12/12/23 0335 12/13/23 0626 12/14/23 0820 12/14/23 1629 12/15/23 0419 12/16/23 0400  WBC 5.5 4.7 4.4  --  5.8 6.3  HGB 8.7* 8.9* 8.2* 9.1* 8.8* 8.7*  HCT 26.7* 27.6* 26.4* 28.8* 27.1* 26.7*  MCV 87.5 88.2 92.3  --  88.3 87.5  PLT 158 186 171  --  208 204   Basic Metabolic Panel: Recent Labs  Lab 12/12/23 0335 12/12/23 0416 12/13/23 0626 12/14/23 0820 12/15/23 0419 12/16/23 0400  NA 133*  --  137 138 138 138  K 3.5  --  4.1 4.6 4.2 4.1  CL 108  --  110 112* 111 110  CO2 16*  --  19* 15* 17* 18*  GLUCOSE 171*  --  151* 130* 162* 178*  BUN 28*  --  29* 29* 31* 30*  CREATININE 2.32*  --  2.51* 2.65* 2.74* 2.75*  CALCIUM  7.0*  --  7.6* 8.0* 8.7* 8.8*  MG  --  1.2* 1.9  --   --   --    GFR: Estimated Creatinine Clearance: 15.2 mL/min (A) (by C-G formula based on SCr of 2.75 mg/dL (H)). Liver Function Tests: Recent Labs  Lab 12/10/23 0839 12/15/23 0419 12/16/23 0400  AST 32 28 27  ALT 17 20 19   ALKPHOS 130* 107 111  BILITOT 1.1 0.8 0.9  PROT 6.3* 5.5* 5.4*  ALBUMIN  2.8* 2.1* 2.0*   No results for input(s): LIPASE, AMYLASE in the last 168 hours. No results for input(s): AMMONIA in the last 168 hours. Coagulation Profile: No results for input(s): INR, PROTIME in the last 168 hours. Cardiac Enzymes: No results for input(s): CKTOTAL, CKMB, CKMBINDEX, TROPONINI in the last 168 hours. BNP (last 3 results) No results for input(s): PROBNP in the last 8760 hours. HbA1C: No results for input(s): HGBA1C in the last 72 hours. CBG: Recent Labs   Lab 12/15/23 1119 12/15/23 1508 12/15/23 2127 12/16/23 0609 12/16/23 1124  GLUCAP 147* 169* 142* 162* 167*   Lipid Profile: No results for input(s): CHOL, HDL, LDLCALC, TRIG, CHOLHDL, LDLDIRECT in the last 72 hours. Thyroid  Function Tests: No results for input(s): TSH, T4TOTAL, FREET4, T3FREE, THYROIDAB in the last 72 hours. Anemia Panel: Recent Labs    12/15/23 0419  FERRITIN 941*  TIBC 182*  IRON  30*   Sepsis Labs: No results for input(s): PROCALCITON, LATICACIDVEN in the last 168 hours.  Recent Results (from the past 240 hours)  Blood Culture (routine x 2)     Status: None   Collection Time: 12/08/23  3:15 PM   Specimen: BLOOD  Result Value Ref Range Status   Specimen Description BLOOD SITE NOT SPECIFIED  Final   Special Requests   Final    BOTTLES DRAWN AEROBIC AND ANAEROBIC Blood Culture results may not be optimal due  to an inadequate volume of blood received in culture bottles   Culture   Final    NO GROWTH 5 DAYS Performed at [facility], [address], [place], [place] [address]    Report Status 12/13/2023 FINAL  Final  Blood Culture (routine x 2)     Status: None   Collection Time: 12/08/23  3:20 PM   Specimen: BLOOD  Result Value Ref Range Status   Specimen Description BLOOD SITE NOT SPECIFIED  Final   Special Requests   Final    BOTTLES DRAWN AEROBIC AND ANAEROBIC Blood Culture results may not be optimal due to an inadequate volume of blood received in culture bottles   Culture   Final    NO GROWTH 5 DAYS Performed at [facility], [address] [place], [place] [address]    Report Status 12/13/2023 FINAL  Final    Radiology Studies: No results found.  Scheduled Meds:   stroke: early stages of recovery book   Does not apply Once   sodium chloride    Intravenous Once   apixaban   5 mg Oral BID   atorvastatin   40 mg Oral Daily   insulin  aspart  0-9 Units Subcutaneous TID WC   levothyroxine   112 mcg Oral Q0600    sodium chloride  flush  3 mL Intravenous Q12H   Continuous Infusions:  sodium chloride        LOS: 8 days    Time spent: 50 MINS    Darcel Dawley, MD [facility]   If 7PM-7AM, please contact night-coverage

## 2023-12-16 NOTE — Plan of Care (Signed)
 Problem: Education: Goal: Ability to describe self-care measures that may prevent or decrease complications (Diabetes Survival Skills Education) will improve 12/16/2023 0608 by Baldwin Doran BIRCH, RN Outcome: Not Progressing 12/16/2023 0555 by Baldwin Doran BIRCH, RN Outcome: Not Progressing Goal: Individualized Educational Video(s) 12/16/2023 0608 by Baldwin Doran BIRCH, RN Outcome: Not Progressing 12/16/2023 0555 by Baldwin Doran BIRCH, RN Outcome: Not Progressing   Problem: Coping: Goal: Ability to adjust to condition or change in health will improve 12/16/2023 0608 by Baldwin Doran BIRCH, RN Outcome: Not Progressing 12/16/2023 0555 by Baldwin Doran BIRCH, RN Outcome: Not Progressing   Problem: Fluid Volume: Goal: Ability to maintain a balanced intake and output will improve 12/16/2023 0608 by Baldwin Doran BIRCH, RN Outcome: Not Progressing 12/16/2023 0555 by Baldwin Doran BIRCH, RN Outcome: Not Progressing   Problem: Health Behavior/Discharge Planning: Goal: Ability to identify and utilize available resources and services will improve 12/16/2023 0608 by Baldwin Doran BIRCH, RN Outcome: Not Progressing 12/16/2023 0555 by Baldwin Doran BIRCH, RN Outcome: Not Progressing Goal: Ability to manage health-related needs will improve 12/16/2023 0608 by Baldwin Doran BIRCH, RN Outcome: Not Progressing 12/16/2023 0555 by Baldwin Doran BIRCH, RN Outcome: Not Progressing   Problem: Metabolic: Goal: Ability to maintain appropriate glucose levels will improve 12/16/2023 0608 by Baldwin Doran BIRCH, RN Outcome: Not Progressing 12/16/2023 0555 by Baldwin Doran BIRCH, RN Outcome: Not Progressing   Problem: Nutritional: Goal: Maintenance of adequate nutrition will improve 12/16/2023 0608 by Baldwin Doran BIRCH, RN Outcome: Not Progressing 12/16/2023 0555 by Baldwin Doran BIRCH, RN Outcome: Not Progressing Goal: Progress toward achieving an optimal weight will improve 12/16/2023 0608 by Baldwin Doran BIRCH,  RN Outcome: Not Progressing 12/16/2023 0555 by Baldwin Doran BIRCH, RN Outcome: Not Progressing   Problem: Skin Integrity: Goal: Risk for impaired skin integrity will decrease 12/16/2023 0608 by Baldwin Doran BIRCH, RN Outcome: Not Progressing 12/16/2023 0555 by Baldwin Doran BIRCH, RN Outcome: Not Progressing   Problem: Tissue Perfusion: Goal: Adequacy of tissue perfusion will improve 12/16/2023 0608 by Baldwin Doran BIRCH, RN Outcome: Not Progressing 12/16/2023 0555 by Baldwin Doran BIRCH, RN Outcome: Not Progressing   Problem: Education: Goal: Knowledge of General Education information will improve Description: Including pain rating scale, medication(s)/side effects and non-pharmacologic comfort measures 12/16/2023 0608 by Baldwin Doran BIRCH, RN Outcome: Not Progressing 12/16/2023 0555 by Baldwin Doran BIRCH, RN Outcome: Not Progressing   Problem: Health Behavior/Discharge Planning: Goal: Ability to manage health-related needs will improve 12/16/2023 0608 by Baldwin Doran BIRCH, RN Outcome: Not Progressing 12/16/2023 0555 by Baldwin Doran BIRCH, RN Outcome: Not Progressing   Problem: Clinical Measurements: Goal: Ability to maintain clinical measurements within normal limits will improve 12/16/2023 0608 by Baldwin Doran BIRCH, RN Outcome: Not Progressing 12/16/2023 0555 by Baldwin Doran BIRCH, RN Outcome: Not Progressing Goal: Will remain free from infection 12/16/2023 0608 by Baldwin Doran BIRCH, RN Outcome: Not Progressing 12/16/2023 0555 by Baldwin Doran BIRCH, RN Outcome: Not Progressing Goal: Diagnostic test results will improve 12/16/2023 0608 by Baldwin Doran BIRCH, RN Outcome: Not Progressing 12/16/2023 0555 by Baldwin Doran BIRCH, RN Outcome: Not Progressing Goal: Respiratory complications will improve 12/16/2023 0608 by Baldwin Doran BIRCH, RN Outcome: Not Progressing 12/16/2023 0555 by Baldwin Doran D, RN Outcome: Not Progressing Goal: Cardiovascular complication will be  avoided 12/16/2023 9391 by Baldwin Doran BIRCH, RN Outcome: Not Progressing 12/16/2023 0555 by Baldwin Doran BIRCH, RN Outcome: Not Progressing   Problem: Activity: Goal: Risk for activity intolerance will decrease 12/16/2023 0608 by Baldwin Doran BIRCH, RN Outcome: Not Progressing 12/16/2023  9444 by Baldwin Doran BIRCH, RN Outcome: Not Progressing   Problem: Nutrition: Goal: Adequate nutrition will be maintained 12/16/2023 0608 by Baldwin Doran BIRCH, RN Outcome: Not Progressing 12/16/2023 0555 by Baldwin Doran D, RN Outcome: Not Progressing   Problem: Coping: Goal: Level of anxiety will decrease 12/16/2023 0608 by Baldwin Doran BIRCH, RN Outcome: Not Progressing 12/16/2023 0555 by Baldwin Doran BIRCH, RN Outcome: Not Progressing   Problem: Elimination: Goal: Will not experience complications related to bowel motility 12/16/2023 0608 by Baldwin Doran BIRCH, RN Outcome: Not Progressing 12/16/2023 0555 by Baldwin Doran BIRCH, RN Outcome: Not Progressing Goal: Will not experience complications related to urinary retention 12/16/2023 0608 by Baldwin Doran BIRCH, RN Outcome: Not Progressing 12/16/2023 0555 by Baldwin Doran BIRCH, RN Outcome: Not Progressing   Problem: Pain Managment: Goal: General experience of comfort will improve and/or be controlled 12/16/2023 0608 by Baldwin Doran BIRCH, RN Outcome: Not Progressing 12/16/2023 0555 by Baldwin Doran BIRCH, RN Outcome: Not Progressing   Problem: Safety: Goal: Ability to remain free from injury will improve 12/16/2023 0608 by Baldwin Doran BIRCH, RN Outcome: Not Progressing 12/16/2023 0555 by Baldwin Doran BIRCH, RN Outcome: Not Progressing   Problem: Skin Integrity: Goal: Risk for impaired skin integrity will decrease 12/16/2023 0608 by Baldwin Doran BIRCH, RN Outcome: Not Progressing 12/16/2023 0555 by Baldwin Doran BIRCH, RN Outcome: Not Progressing   Problem: Education: Goal: Knowledge of disease or condition will improve 12/16/2023 0608  by Baldwin Doran BIRCH, RN Outcome: Not Progressing 12/16/2023 0555 by Baldwin Doran BIRCH, RN Outcome: Not Progressing Goal: Knowledge of secondary prevention will improve (MUST DOCUMENT ALL) 12/16/2023 0608 by Baldwin Doran BIRCH, RN Outcome: Not Progressing 12/16/2023 0555 by Baldwin Doran BIRCH, RN Outcome: Not Progressing Goal: Knowledge of patient specific risk factors will improve (DELETE if not current risk factor) 12/16/2023 0608 by Baldwin Doran BIRCH, RN Outcome: Not Progressing 12/16/2023 0555 by Baldwin Doran BIRCH, RN Outcome: Not Progressing   Problem: Ischemic Stroke/TIA Tissue Perfusion: Goal: Complications of ischemic stroke/TIA will be minimized 12/16/2023 0608 by Baldwin Doran BIRCH, RN Outcome: Not Progressing 12/16/2023 0555 by Baldwin Doran BIRCH, RN Outcome: Not Progressing   Problem: Coping: Goal: Will verbalize positive feelings about self 12/16/2023 0608 by Baldwin Doran BIRCH, RN Outcome: Not Progressing 12/16/2023 0555 by Baldwin Doran BIRCH, RN Outcome: Not Progressing Goal: Will identify appropriate support needs 12/16/2023 0608 by Baldwin Doran BIRCH, RN Outcome: Not Progressing 12/16/2023 0555 by Baldwin Doran BIRCH, RN Outcome: Not Progressing   Problem: Health Behavior/Discharge Planning: Goal: Ability to manage health-related needs will improve 12/16/2023 0608 by Baldwin Doran BIRCH, RN Outcome: Not Progressing 12/16/2023 0555 by Baldwin Doran BIRCH, RN Outcome: Not Progressing Goal: Goals will be collaboratively established with patient/family 12/16/2023 [phone] by Baldwin Doran BIRCH, RN Outcome: Not Progressing 12/16/2023 0555 by Baldwin Doran BIRCH, RN Outcome: Not Progressing   Problem: Self-Care: Goal: Ability to participate in self-care as condition permits will improve 12/16/2023 0608 by Baldwin Doran BIRCH, RN Outcome: Not Progressing 12/16/2023 0555 by Baldwin Doran BIRCH, RN Outcome: Not Progressing Goal: Verbalization of feelings and concerns over  difficulty with self-care will improve 12/16/2023 0608 by Baldwin Doran BIRCH, RN Outcome: Not Progressing 12/16/2023 0555 by Baldwin Doran BIRCH, RN Outcome: Not Progressing Goal: Ability to communicate needs accurately will improve 12/16/2023 0608 by Baldwin Doran BIRCH, RN Outcome: Not Progressing 12/16/2023 0555 by Baldwin Doran BIRCH, RN Outcome: Not Progressing   Problem: Nutrition: Goal: Risk of aspiration will decrease 12/16/2023 0608 by Baldwin Doran BIRCH, RN Outcome: Not Progressing  12/16/2023 0555 by Baldwin Bright D, RN Outcome: Not Progressing Goal: Dietary intake will improve 12/16/2023 9391 by Baldwin Bright BIRCH, RN Outcome: Not Progressing 12/16/2023 0555 by Baldwin Bright BIRCH, RN Outcome: Not Progressing

## 2023-12-16 NOTE — Progress Notes (Signed)
 Occupational Therapy Treatment Patient Details Name: Marco [name] MRN: [id number] DOB: Sep 29, 1933 Today's Date: 12/16/2023   History of present illness Pt is a 89 y.o. male presenting to [facility] after a fall and inability to walk, found to have subacute L1 fx. 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 Pt also with acute LLE DVT. PMH significant for advanced stage IV prostate cancer with bony mets, Type II DM, L hip fx s/p IM nailing 09/2023, CAD, CKD III   OT comments  Pt seems to have had a slow decline since last therapy session, pt not very alert and needing total A-total A+2 for all aspects of care and mobility. Pt with minimal initiation efforts for tasks and mobility, inconsistent with ability to follow commands and seems to have generalized pain throughout body. With time and increased encouragement of anterior lean he was able to obtain a brief period of CGA for sitting balance. OT to reduce pt freq down to 1x/week given how limited pt has been. Patient will benefit from continued inpatient follow up therapy, <3 hours/day       If plan is discharge home, recommend the following:  Two people to help with walking and/or transfers;Two people to help with bathing/dressing/bathroom;Assistance with cooking/housework;Assistance with feeding;Direct supervision/assist for medications management;Direct supervision/assist for financial management;Assist for transportation;Help with stairs or ramp for entrance;Supervision due to cognitive status   Equipment Recommendations  None recommended by OT    Recommendations for Other Services      Precautions / Restrictions Precautions Precautions: Fall Recall of Precautions/Restrictions: Impaired Precaution/Restrictions Comments: No spinal prec or brace per MD (L1 subacute fx) Restrictions Weight Bearing Restrictions Per Provider Order: No        Mobility Bed Mobility Overal bed mobility: Needs Assistance Bed Mobility: Rolling, Sidelying to Sit Rolling: Total assist, +2 for physical assistance, +2 for safety/equipment Sidelying to sit: Total assist, +2 for safety/equipment, +2 for physical assistance, Used rails       General bed mobility comments: TotalAx2 for all aspects with use of bed pad to shift hips    Transfers Overall transfer level: Needs assistance Equipment used: 2 person hand held assist Transfers: Bed to chair/wheelchair/BSC     Squat pivot transfers: Total assist, +2 physical assistance, +2 safety/equipment       General transfer comment: TotalAx2 with use of bed pad and gait belt to squat pivot to recliner. Maxi lift pad underneath pt. Transfer via Lift Equipment: Stedy   Balance Overall balance assessment: Needs assistance, History of Falls Sitting-balance support: Feet supported, Single extremity supported Sitting balance-Leahy Scale: Poor Sitting balance - Comments: MaxA to CGA for posterior lean. Pt resisting anterior trunk lean Postural control: Posterior lean Standing balance support: Bilateral upper extremity supported, During functional activity, Reliant on assistive device for balance Standing balance-Leahy Scale: Zero Standing balance comment: unable to maintain stand with MaxAx2                           ADL either performed or assessed with clinical judgement   ADL                                         General ADL Comments: Pt total A for ADLs at this time, no effort/initiation  Extremity/Trunk Assessment Upper Extremity Assessment Upper Extremity Assessment: Difficult to assess due to impaired cognition RUE Deficits / Details: feels tight, pt could have been resistive. Limited use LUE Deficits / Details: able to bring to mouth to scratch it, fair grasp.            Vision   Vision Assessment?: Vision impaired- to be further tested in  functional context Additional Comments: R gaze pref, per pt grandson the pt R field affected by stroke but pt keeps L eye closed.   Perception     Praxis Praxis Praxis: Impaired Praxis Impairment Details: Initiation   Communication Communication Communication: Impaired Factors Affecting Communication: Difficulty expressing self;Reduced clarity of speech   Cognition Arousal: Alert Behavior During Therapy: Flat affect Cognition: Difficult to assess Difficult to assess due to: Level of arousal           OT - Cognition Comments: pt mostly non-verbal throughout session, unable to may gesture but can state and occasional okay yes or no                 Following commands: Impaired Following commands impaired: Follows one step commands inconsistently      Cueing   Cueing Techniques: Verbal cues, Gestural cues, Tactile cues  Exercises Other Exercises Other Exercises: OT faciliating anterior weight shifitng while sitting EOB    Shoulder Instructions       General Comments Grandson present and supportive during session.    Pertinent Vitals/ Pain       Pain Assessment Pain Assessment: Faces Faces Pain Scale: Hurts even more Pain Location: R shoulder and B LE's with movement Pain Descriptors / Indicators: Discomfort, Crying Pain Intervention(s): Monitored during session, Repositioned, Limited activity within patient's tolerance  Home Living                                          Prior Functioning/Environment              Frequency  Min 1X/week        Progress Toward Goals  OT Goals(current goals can now be found in the care plan section)  Progress towards OT goals: Not progressing toward goals - comment  Acute Rehab OT Goals OT Goal Formulation: With family Time For Goal Achievement: 12/23/23 Potential to Achieve Goals: Fair  Plan      Co-evaluation      Reason for Co-Treatment: Complexity of the patient's impairments  (multi-system involvement);Necessary to address cognition/behavior during functional activity;For patient/therapist safety;To address functional/ADL transfers PT goals addressed during session: Mobility/safety with mobility;Balance        AM-PAC OT 6 Clicks Daily Activity     Outcome Measure   Help from another person eating meals?: Total Help from another person taking care of personal grooming?: Total Help from another person toileting, which includes using toliet, bedpan, or urinal?: Total Help from another person bathing (including washing, rinsing, drying)?: Total Help from another person to put on and taking off regular upper body clothing?: Total Help from another person to put on and taking off regular lower body clothing?: Total 6 Click Score: 6    End of Session Equipment Utilized During Treatment: Gait belt  OT Visit Diagnosis: Muscle weakness (generalized) (M62.81);Repeated falls (R29.6);Other abnormalities of gait and mobility (R26.89);Other symptoms and signs involving the nervous system (R29.898);History of falling (Z91.81)   Activity Tolerance Patient tolerated treatment well  Patient Left in chair;with call bell/phone within reach;with family/visitor present   Nurse Communication Need for lift equipment;Mobility status (maxi sky)        Time: 1340-1404 OT Time Calculation (min): 24 min  Charges: OT General Charges $OT Visit: 1 Visit OT Treatments $Therapeutic Activity: 8-22 mins  12/16/2023  AB, OTR/L  Acute Rehabilitation Services  Office: [phone]   Curtistine JONETTA Das 12/16/2023, 3:48 PM

## 2023-12-16 NOTE — Plan of Care (Signed)
   Problem: Education: Goal: Ability to describe self-care measures that may prevent or decrease complications (Diabetes Survival Skills Education) will improve Outcome: Not Progressing Goal: Individualized Educational Video(s) Outcome: Not Progressing   Problem: Coping: Goal: Ability to adjust to condition or change in health will improve Outcome: Not Progressing   Problem: Fluid Volume: Goal: Ability to maintain a balanced intake and output will improve Outcome: Not Progressing   Problem: Health Behavior/Discharge Planning: Goal: Ability to identify and utilize available resources and services will improve Outcome: Not Progressing Goal: Ability to manage health-related needs will improve Outcome: Not Progressing   Problem: Metabolic: Goal: Ability to maintain appropriate glucose levels will improve Outcome: Not Progressing   Problem: Nutritional: Goal: Maintenance of adequate nutrition will improve Outcome: Not Progressing Goal: Progress toward achieving an optimal weight will improve Outcome: Not Progressing   Problem: Skin Integrity: Goal: Risk for impaired skin integrity will decrease Outcome: Not Progressing   Problem: Tissue Perfusion: Goal: Adequacy of tissue perfusion will improve Outcome: Not Progressing   Problem: Education: Goal: Knowledge of General Education information will improve Description: Including pain rating scale, medication(s)/side effects and non-pharmacologic comfort measures Outcome: Not Progressing   Problem: Health Behavior/Discharge Planning: Goal: Ability to manage health-related needs will improve Outcome: Not Progressing   Problem: Clinical Measurements: Goal: Ability to maintain clinical measurements within normal limits will improve Outcome: Not Progressing Goal: Will remain free from infection Outcome: Not Progressing Goal: Diagnostic test results will improve Outcome: Not Progressing Goal: Respiratory complications will  improve Outcome: Not Progressing Goal: Cardiovascular complication will be avoided Outcome: Not Progressing   Problem: Activity: Goal: Risk for activity intolerance will decrease Outcome: Not Progressing   Problem: Nutrition: Goal: Adequate nutrition will be maintained Outcome: Not Progressing   Problem: Coping: Goal: Level of anxiety will decrease Outcome: Not Progressing   Problem: Elimination: Goal: Will not experience complications related to bowel motility Outcome: Not Progressing Goal: Will not experience complications related to urinary retention Outcome: Not Progressing   Problem: Pain Managment: Goal: General experience of comfort will improve and/or be controlled Outcome: Not Progressing   Problem: Safety: Goal: Ability to remain free from injury will improve Outcome: Not Progressing   Problem: Skin Integrity: Goal: Risk for impaired skin integrity will decrease Outcome: Not Progressing   Problem: Education: Goal: Knowledge of disease or condition will improve Outcome: Not Progressing Goal: Knowledge of secondary prevention will improve (MUST DOCUMENT ALL) Outcome: Not Progressing Goal: Knowledge of patient specific risk factors will improve (DELETE if not current risk factor) Outcome: Not Progressing   Problem: Ischemic Stroke/TIA Tissue Perfusion: Goal: Complications of ischemic stroke/TIA will be minimized Outcome: Not Progressing   Problem: Coping: Goal: Will verbalize positive feelings about self Outcome: Not Progressing Goal: Will identify appropriate support needs Outcome: Not Progressing   Problem: Health Behavior/Discharge Planning: Goal: Ability to manage health-related needs will improve Outcome: Not Progressing Goal: Goals will be collaboratively established with patient/family Outcome: Not Progressing   Problem: Self-Care: Goal: Ability to participate in self-care as condition permits will improve Outcome: Not Progressing Goal:  Verbalization of feelings and concerns over difficulty with self-care will improve Outcome: Not Progressing Goal: Ability to communicate needs accurately will improve Outcome: Not Progressing   Problem: Nutrition: Goal: Risk of aspiration will decrease Outcome: Not Progressing Goal: Dietary intake will improve Outcome: Not Progressing

## 2023-12-17 DIAGNOSIS — E43 Unspecified severe protein-calorie malnutrition: Secondary | ICD-10-CM | POA: Insufficient documentation

## 2023-12-17 DIAGNOSIS — I6340 Cerebral infarction due to embolism of unspecified cerebral artery: Secondary | ICD-10-CM | POA: Diagnosis not present

## 2023-12-17 LAB — GLUCOSE, CAPILLARY
Glucose-Capillary: 166 mg/dL — ABNORMAL HIGH (ref 70–99)
Glucose-Capillary: 162 mg/dL — ABNORMAL HIGH (ref 70–99)

## 2023-12-17 MED ORDER — LEVOTHYROXINE SODIUM 112 MCG PO TABS: 112.0000 ug | ORAL_TABLET | Freq: Every day | ORAL | 0 refills | Status: AC

## 2023-12-17 MED ORDER — BISACODYL 5 MG PO TBEC: 5.0000 mg | DELAYED_RELEASE_TABLET | Freq: Every day | ORAL | 0 refills | Status: DC | PRN

## 2023-12-17 MED ORDER — ENSURE PLUS HIGH PROTEIN PO LIQD: 237.0000 mL | Freq: Two times a day (BID) | ORAL | 0 refills | Status: AC

## 2023-12-17 MED ORDER — OXYCODONE HCL 5 MG PO TABS: 5.0000 mg | ORAL_TABLET | ORAL | 0 refills | Status: AC | PRN

## 2023-12-17 MED ORDER — APIXABAN 5 MG PO TABS: 5.0000 mg | ORAL_TABLET | Freq: Two times a day (BID) | ORAL | 0 refills | Status: AC

## 2023-12-17 NOTE — Discharge Summary (Signed)
 Physician Discharge Summary  Marco [name] FMW:[id number] DOB: 1933-07-14 DOA: 12/08/2023  PCP: Amon Aloysius BRAVO, MD  Admit date: 12/08/2023  Discharge date: 12/17/2023  Admitted From: Home  Disposition:  SNF  Recommendations for Outpatient Follow-up:  Follow up with PCP in 1-2 weeks. Please obtain BMP/CBC in one week. Advised to follow-up with Neurology as scheduled. Advised to take Eliquis  5 mg twice daily. Advised to follow-up with oncology as scheduled.  Home Health:None Equipment/Devices:None  Discharge Condition: Stable CODE STATUS:DNR Diet recommendation: Dysphagia I diet  Brief Summary/ Hospital Course: This 89 yrs old Male with metastatic prostate CA, DM, HTN, CKD IIIb baseline 1.3-1.5 , recent hip fracture last May, hypothyroidism, and prior amaurosis fugax who presented with acute on chronic weakness and falls. Evidently recently had new infusion of a prostate-specific membrane antigen radioligand Pluvicto , after which family noticed he got weaker, stopped eating or walking, and could not get up.  Sent to the ER and found to have AKI and stroke.  Patient was evaluated by Neurology, MRI brain confirmed left PCA large infarct with thalamus and punctate right cerebellar stroke.  Echocardiogram normal , lower extremity venous duplex showed left femoral DVT.  Patient started on Eliquis . Neurology recommended to continue Eliquis  and Lipitor.  PT and OT recommended skilled nursing facility for rehab.  Patient has waxing and waning in mental status.  Palliative care consulted to discuss goals of care. Patient is being discharged to skilled nursing facility with palliative care following.  Discharge Diagnoses:  Principal Problem:   Stroke [facility]) Active Problems:   Prostate cancer metastatic to bone (HCC)   AKI (acute kidney injury) (HCC)   Myelophthisic anemia (HCC)   Goals of care, counseling/discussion   Protein-calorie malnutrition, severe  Acute kidney injury:  This occurred  after starting Pluvicto , likely medication related ATN.   Urinalysis unremarkable.  Initially improved slightly with IV fluids, since then creatinine has stalled at 2.7. Urine output good. Resumed IV fluids given worsening in the last 48 hours Monitor Strict ins and outs. Now creatinine is at new baseline.     Acute left PCA stroke: MRI brain confirmed left PCA large infarct with thalamus and punctate right cerebella stroke.   Likely embolic from hypercoagulable state per neurology.    Echocardiogram normal, duplex ultrasound showed left femoral DVT, TCD negative Continue Eliquis  Continue Lipitor Plan for SNF.     Left femoral DVT: Started on heparin  and transition to Eliquis . Continue Eliquis    Progressive anemia: Since starting anticoagulation, the patient's hemoglobin dropped from baseline 11.6 down to 8.2.   In the last 24 hours this is rebounded.  There is been no bleeding observed or reported.   He had anemia requiring transfusion in May when he had his hip repair, but no history of GI bleeding.   Not a candidate for endoscopy. 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 Recommend palliative care to follow at SNF   Pathologic compression fracture of the thoracic spine: PT/OT eval recommended SNF   Acute metabolic encephalopathy: At baseline, patient has no significant dementia.   Here he has waxing and waning delirium/disorientation due to stroke, renal failure. - Delirium precautions.   Hypothyroidism: Continue levothyroxine .   Diabetes Mellitus II: Glucose controlled Stop metformin  given renal function Continue SS correction insulin .    Discharge Instructions  Discharge Instructions     Ambulatory referral to Neurology   Complete  by: As directed    Follow up with stroke clinic NP at [facility] in about 4-6 weeks. Thanks.    Call MD for:  difficulty breathing, headache or visual disturbances   Complete by: As directed    Call MD for:  persistant dizziness or light-headedness   Complete by: As directed    Call MD for:  persistant nausea and vomiting   Complete by: As directed    Diet - low sodium heart healthy   Complete by: As directed    Diet general   Complete by: As directed    Discharge instructions   Complete by: As directed    Advised to follow-up with primary care physician in 1 week. Advised to take Eliquis  5 mg twice daily. Advised to follow-up with oncology as scheduled.   Increase activity slowly   Complete by: As directed    No wound care   Complete by: As directed       Allergies as of 12/17/2023       Reactions   Beef-derived Drug Products Other (See Comments)   Vegetarian   Chicken Allergy Other (See Comments)   Vegetarian   Fish Allergy Other (See Comments)   Vegetarian   Pork-derived Products Other (See Comments)   Vegetarian        Medication List     STOP taking these medications    ibuprofen 200 MG tablet Commonly known as: ADVIL   metFORMIN  500 MG tablet Commonly known as: GLUCOPHAGE    Xtandi  40 MG tablet Generic drug: enzalutamide        TAKE these medications    Accu-Chek Guide Control Liqd Use as directed   Accu-Chek Guide test strip Generic drug: glucose blood USE TO CHECK BLOOD SUGAR ONCE  DAILY   Accu-Chek Guide w/Device Kit Check blood sugars once daily   Accu-Chek Softclix Lancets lancets CHECK BLOOD SUGAR ONCE DAILY AS  DIRECTED   alendronate  70 MG tablet Commonly known as: FOSAMAX  Take 1 tablet (70 mg total) by mouth once a week. Take with full glass of water on empty stomach   apixaban  5 MG Tabs tablet Commonly known as: ELIQUIS  Take 1 tablet (5 mg total) by mouth 2 (two) times daily.   atorvastatin  40 MG tablet Commonly known as: LIPITOR Take 1 tablet (40 mg total) by mouth at bedtime.   bisacodyl  5 MG EC tablet Commonly  known as: DULCOLAX Take 1 tablet (5 mg total) by mouth daily as needed for moderate constipation.   Calcium  500 + D 500-125 MG-UNIT tablet Generic drug: Calcium  Carb-Cholecalciferol Take 1 tablet by mouth daily.   feeding supplement Liqd Take 237 mLs by mouth 2 (two) times daily between meals.   ferrous sulfate 325 (65 FE) MG EC tablet Take 325 mg by mouth daily with breakfast.   levothyroxine  112 MCG tablet Commonly known as: SYNTHROID  Take 1 tablet (112 mcg total) by mouth daily at 6 (six) AM. Start taking on: December 18, 2023 What changed:  medication strength how much to take when to take this   Lokelma  10 g Pack packet Generic drug: sodium zirconium cyclosilicate  MIX 1 PACKET IN AT LEAST 3  TABLESPOONS OF WATER STIR AND  DRINK IMMEDIATELY EVERY OTHER  DAY   MULTI-VITAMIN DAILY PO Take 1 tablet by mouth daily.   ondansetron  4 MG tablet Commonly known as: Zofran  Take 1 tablet (4 mg total) by mouth every 8 (eight) hours as needed for nausea or vomiting.   oxyCODONE  5 MG immediate release tablet  Commonly known as: Oxy IR/ROXICODONE  Take 1 tablet (5 mg total) by mouth every 4 (four) hours as needed for up to 3 days for moderate pain (pain score 4-6).   VITAMIN B-12 PO Take 2,500 mcg by mouth daily.        Contact information for follow-up providers     [facility]. Schedule an appointment as soon as possible for a visit in 1 month(s).   Specialty: Neurology Why: stroke clinic Contact information: [address] [place]  [address] [phone]        Amon Aloysius BRAVO, MD Follow up in 1 week(s).   Specialty: Internal Medicine Contact information: [address] [place] [address] [phone]              Contact information for after-discharge care     Destination     [facility] .   Service: Skilled Nursing Contact information: [address] [place]  [place] [address] [phone]                    Allergies  Allergen Reactions   Beef-Derived Drug Products Other (See Comments)    Vegetarian   Chicken Allergy Other (See Comments)    Vegetarian   Fish Allergy Other (See Comments)    Vegetarian    Pork-Derived Products Other (See Comments)    Vegetarian     Consultations: Neurology Oncology   Procedures/Studies: VAS US  TRANSCRANIAL DOPPLER W BUBBLES Result Date: 12/10/2023  Transcranial Doppler with Bubble Patient Name:  NIELS CRANSHAW  Date of Exam:   12/10/2023 Medical Rec #: [id number]          Accession #:    [id number] Date of Birth: Jul 02, 1933           Patient Gender: M Patient Age:   59 years Exam Location:  [facility] Procedure:      VAS US  TRANSCRANIAL DOPPLER W BUBBLES Referring Phys: ARY XU --------------------------------------------------------------------------------  Indications: Embolic stroke, positive for DVT, advanced metastatic prostate cancer. Limitations: Patient movement Comparison Study: No prior study Performing Technologist: Alberta Lis RVS  Examination Guidelines: A complete evaluation includes B-mode imaging, spectral Doppler, color Doppler, and power Doppler as needed of all accessible portions of each vessel. Bilateral testing is considered an integral part of a complete examination. Limited examinations for reoccurring indications may be performed as noted.  Summary:  A vascular evaluation was performed. The right middle cerebral artery was studied. An IV was inserted into the patient's left AC. Verbal informed consent was obtained.  No HITS at rest or during Valsalva. Valsalva was suboptimal given pt medical conditions. Negative transcranial Doppler Bubble study with no evidence of right to left intracardiac communication.  Diagnosing physician: ARY Cummins MD Electronically signed by ARY Cummins MD on 12/10/2023 at 4:59:20 PM.    Final    VAS US  LOWER EXTREMITY VENOUS (DVT) Result Date:  12/09/2023  Lower Venous DVT Study Patient Name:  SANJIT MCMICHAEL  Date of Exam:   12/09/2023 Medical Rec #: [id number]          Accession #:    [id number] Date of Birth: [birth year]           Patient Gender: M Patient Age:   30 years Exam Location:  [facility] Procedure:      VAS US  LOWER EXTREMITY VENOUS (DVT) Referring Phys: ARY XU --------------------------------------------------------------------------------  Indications: Embolic stroke.  Risk Factors: Prostate cancer,  bony metastases. Comparison Study: 02-11-21 - negative Performing Technologist: Ricka Sturdivant-Jones RDMS, RVT  Examination Guidelines: A complete evaluation includes B-mode imaging, spectral Doppler, color Doppler, and power Doppler as needed of all accessible portions of each vessel. Bilateral testing is considered an integral part of a complete examination. Limited examinations for reoccurring indications may be performed as noted. The reflux portion of the exam is performed with the patient in reverse Trendelenburg.  +---------+---------------+---------+-----------+----------+--------------+ RIGHT    CompressibilityPhasicitySpontaneityPropertiesThrombus Aging +---------+---------------+---------+-----------+----------+--------------+ CFV      Full           Yes      Yes                                 +---------+---------------+---------+-----------+----------+--------------+ SFJ      Full                                                        +---------+---------------+---------+-----------+----------+--------------+ FV Prox  Full                                                        +---------+---------------+---------+-----------+----------+--------------+ FV Mid   Full                                                        +---------+---------------+---------+-----------+----------+--------------+ FV DistalFull                                                         +---------+---------------+---------+-----------+----------+--------------+ PFV      Full                                                        +---------+---------------+---------+-----------+----------+--------------+ POP      Full           Yes      Yes                                 +---------+---------------+---------+-----------+----------+--------------+ PTV      Full                                                        +---------+---------------+---------+-----------+----------+--------------+ PERO     Full                                                        +---------+---------------+---------+-----------+----------+--------------+   +---------+---------------+---------+-----------+----------+--------------+  LEFT     CompressibilityPhasicitySpontaneityPropertiesThrombus Aging +---------+---------------+---------+-----------+----------+--------------+ CFV      Full           Yes      Yes                                 +---------+---------------+---------+-----------+----------+--------------+ SFJ      Full                                                        +---------+---------------+---------+-----------+----------+--------------+ FV Prox  Full                                                        +---------+---------------+---------+-----------+----------+--------------+ FV Mid   Partial        Yes      Yes                  Acute          +---------+---------------+---------+-----------+----------+--------------+ FV DistalPartial        No       No                   Acute          +---------+---------------+---------+-----------+----------+--------------+ PFV      Full                                                        +---------+---------------+---------+-----------+----------+--------------+ POP      Full                                                         +---------+---------------+---------+-----------+----------+--------------+ PTV      Full                                                        +---------+---------------+---------+-----------+----------+--------------+ PERO     None                                         Acute          +---------+---------------+---------+-----------+----------+--------------+     Summary: RIGHT: - There is no evidence of deep vein thrombosis in the lower extremity.  - No cystic structure found in the popliteal fossa.  LEFT: - Findings consistent with acute deep vein thrombosis involving the left femoral vein, and left peroneal veins.  - No cystic structure found in the popliteal fossa.  *See table(s) above for measurements and observations. Electronically signed by Gaile New MD  on 12/09/2023 at 6:11:30 PM.    Final    ECHOCARDIOGRAM COMPLETE Result Date: 12/09/2023    ECHOCARDIOGRAM REPORT   Patient Name:   YISRAEL OBRYAN Date of Exam: 12/09/2023 Medical Rec #:  [id number]         Height:       68.0 in Accession #:    [id number]        Weight:       127.2 lb Date of Birth:  1933-11-20          BSA:          1.686 m Patient Age:    89 years          BP:           124/67 mmHg Patient Gender: M                 HR:           99 bpm. Exam Location:  Inpatient Procedure: 2D Echo, Cardiac Doppler and Color Doppler (Both Spectral and Color            Flow Doppler were utilized during procedure). Indications:    Stroke  History:        Patient has prior history of Echocardiogram examinations, most                 recent 02/12/2022. CAD, CKD; Risk Factors:Dyslipidemia.  Sonographer:    Therisa Crouch Referring Phys: [id number] JINDONG XU IMPRESSIONS  1. Left ventricular ejection fraction, by estimation, is 65 to 70%. The left ventricle has normal function. The left ventricle has no regional wall motion abnormalities. There is moderate asymmetric left ventricular hypertrophy of the basal-septal segment. Left ventricular  diastolic parameters are indeterminate.  2. Right ventricular systolic function is normal. The right ventricular size is normal. There is mildly elevated pulmonary artery systolic pressure. The estimated right ventricular systolic pressure is 36.3 mmHg.  3. Left atrial size was mildly dilated.  4. The mitral valve is grossly normal. Trivial mitral valve regurgitation. No evidence of mitral stenosis.  5. The aortic valve is grossly normal. Aortic valve regurgitation is mild. No aortic stenosis is present.  6. The inferior vena cava is dilated in size with <50% respiratory variability, suggesting right atrial pressure of 15 mmHg. Conclusion(s)/Recommendation(s): No intracardiac source of embolism detected on this transthoracic study. Consider a transesophageal echocardiogram to exclude cardiac source of embolism if clinically indicated. FINDINGS  Left Ventricle: Left ventricular ejection fraction, by estimation, is 65 to 70%. The left ventricle has normal function. The left ventricle has no regional wall motion abnormalities. The left ventricular internal cavity size was normal in size. There is  moderate asymmetric left ventricular hypertrophy of the basal-septal segment. Left ventricular diastolic parameters are indeterminate. Right Ventricle: The right ventricular size is normal. No increase in right ventricular wall thickness. Right ventricular systolic function is normal. There is mildly elevated pulmonary artery systolic pressure. The tricuspid regurgitant velocity is 2.31  m/s, and with an assumed right atrial pressure of 15 mmHg, the estimated right ventricular systolic pressure is 36.3 mmHg. Left Atrium: Left atrial size was mildly dilated. Right Atrium: Right atrial size was normal in size. Pericardium: There is no evidence of pericardial effusion. Mitral Valve: The mitral valve is grossly normal. Trivial mitral valve regurgitation. No evidence of mitral valve stenosis. Tricuspid Valve: The tricuspid valve  is normal in structure. Tricuspid valve regurgitation is trivial. No evidence of tricuspid stenosis. Aortic Valve: The aortic  valve is grossly normal. Aortic valve regurgitation is mild. Aortic regurgitation PHT measures 376 msec. No aortic stenosis is present. Aortic valve mean gradient measures 4.0 mmHg. Aortic valve peak gradient measures 8.0 mmHg. Pulmonic Valve: The pulmonic valve was normal in structure. Pulmonic valve regurgitation is trivial. No evidence of pulmonic stenosis. Aorta: The aortic root is normal in size and structure. Venous: The inferior vena cava is dilated in size with less than 50% respiratory variability, suggesting right atrial pressure of 15 mmHg. IAS/Shunts: No atrial level shunt detected by color flow Doppler.  LEFT VENTRICLE PLAX 2D LVIDd:         3.40 cm LVIDs:         2.50 cm LV PW:         0.80 cm LV IVS:        1.40 cm  LV Volumes (MOD) LV vol d, MOD A2C: 35.0 ml LV vol d, MOD A4C: 46.5 ml LV vol s, MOD A2C: 9.6 ml LV vol s, MOD A4C: 12.7 ml LV SV MOD A2C:     25.5 ml LV SV MOD A4C:     46.5 ml LV SV MOD BP:      29.3 ml RIGHT VENTRICLE          IVC RV Basal diam:  2.70 cm  IVC diam: 2.40 cm TAPSE (M-mode): 2.9 cm LEFT ATRIUM             Index LA diam:        2.40 cm 1.42 cm/m LA Vol (A2C):   29.3 ml 17.38 ml/m LA Vol (A4C):   47.2 ml 27.99 ml/m LA Biplane Vol: 37.1 ml 22.00 ml/m  AORTIC VALVE AV Vmax:           141.00 cm/s AV Vmean:          94.100 cm/s AV VTI:            0.227 m AV Peak Grad:      8.0 mmHg AV Mean Grad:      4.0 mmHg LVOT Vmax:         85.40 cm/s LVOT Vmean:        57.500 cm/s LVOT VTI:          0.134 m LVOT/AV VTI ratio: 0.59 AI PHT:            376 msec AR Vena Contracta: 0.20 cm  AORTA Ao Root diam: 3.40 cm Ao Asc diam:  3.50 cm TRICUSPID VALVE TR Peak grad:   21.3 mmHg TR Vmax:        231.00 cm/s  SHUNTS Systemic VTI: 0.13 m Soyla Merck MD Electronically signed by Soyla Merck MD Signature Date/Time: 12/09/2023/1:44:24 PM    Final    MR Brain W and  Wo Contrast Addendum Date: 12/08/2023 ADDENDUM REPORT: 12/08/2023 21:34 ADDENDUM: Findings discussed with the on call provider at 9:17 p.m. Electronically Signed   By: Gilmore GORMAN Molt M.D.   On: 12/08/2023 21:34   Result Date: 12/08/2023 CLINICAL DATA:  Stroke, follow up Stroke, also hx of cancer with met; Stroke/TIA, determine embolic source EXAM: MRI HEAD WITHOUT AND WITH CONTRAST MRA HEAD WITHOUT CONTRAST MRA NECK WITHOUT AND WITH CONTRAST TECHNIQUE: Multiplanar, multi-echo pulse sequences of the brain and surrounding structures were acquired without and with intravenous contrast. Angiographic images of the Circle of Willis were acquired using MRA technique without intravenous contrast. Angiographic images of the neck were acquired using MRA technique without and with intravenous contrast. Carotid stenosis  measurements (when applicable) are obtained utilizing NASCET criteria, using the distal internal carotid diameter as the denominator. CONTRAST:  5mL GADAVIST  GADOBUTROL  1 MMOL/ML IV SOLN COMPARISON:  CT head from earlier today. FINDINGS: MRI HEAD FINDINGS Brain: Acute left PCA territory infarct involving the left occipital lobe, hippocampus and thalamus. Additional punctate acute infarct in the right cerebellum. Edema without substantial mass effect. No midline shift. Small amount of petechial hemorrhage associated with the left occipital infarct. No evidence of mass occupying acute hemorrhage, mass lesion or hydrocephalus. No pathologic enhancement. Vascular: See below. Skull and upper cervical spine: Normal marrow signal. Sinuses/Orbits: Clear sinuses.  No acute orbital findings. MRA HEAD FINDINGS Anterior circulation: Bilateral intracranial ICAs, MCAs, and ACAs are patent without proximal hemodynamically significant stenosis. Posterior circulation: Bilateral intradural vertebral arteries, basilar artery and right PCA are patent without proximal him at least significant stenosis. Left proximal to mid P2  PCA occlusion. MRA NECK FINDINGS Aortic arch: Great vessel origins are patent. Motion limited assessment. Right carotid system: On motion limited evaluation the common carotid internal carotid arteries appear patent without significant stenosis. Left carotid system: On motion limited evaluation the common carotid internal carotid arteries appear patent without significant stenosis. Vertebral arteries: Limited evaluation due to motion and contrast timing. The vertebral arteries appear patent bilaterally without visible high-grade stenosis. IMPRESSION: 1. 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 2. Left proximal to mid P2 PCA occlusion. Electronically Signed: By: Gilmore GORMAN Molt M.D. On: 12/08/2023 20:45   MR ANGIO HEAD WO CONTRAST Addendum Date: 12/08/2023 ADDENDUM REPORT: 12/08/2023 21:34 ADDENDUM: Findings discussed with the on call provider at 9:17 p.m. Electronically Signed   By: Gilmore GORMAN Molt M.D.   On: 12/08/2023 21:34   Result Date: 12/08/2023 CLINICAL DATA:  Stroke, follow up Stroke, also hx of cancer with met; Stroke/TIA, determine embolic source EXAM: MRI HEAD WITHOUT AND WITH CONTRAST MRA HEAD WITHOUT CONTRAST MRA NECK WITHOUT AND WITH CONTRAST TECHNIQUE: Multiplanar, multi-echo pulse sequences of the brain and surrounding structures were acquired without and with intravenous contrast. Angiographic images of the Circle of Willis were acquired using MRA technique without intravenous contrast. Angiographic images of the neck were acquired using MRA technique without and with intravenous contrast. Carotid stenosis measurements (when applicable) are obtained utilizing NASCET criteria, using the distal internal carotid diameter as the denominator. CONTRAST:  5mL GADAVIST  GADOBUTROL  1 MMOL/ML IV SOLN COMPARISON:  CT head from earlier today. FINDINGS: MRI HEAD FINDINGS Brain: Acute left PCA territory infarct involving the left occipital lobe, hippocampus and thalamus.  Additional punctate acute infarct in the right cerebellum. Edema without substantial mass effect. No midline shift. Small amount of petechial hemorrhage associated with the left occipital infarct. No evidence of mass occupying acute hemorrhage, mass lesion or hydrocephalus. No pathologic enhancement. Vascular: See below. Skull and upper cervical spine: Normal marrow signal. Sinuses/Orbits: Clear sinuses.  No acute orbital findings. MRA HEAD FINDINGS Anterior circulation: Bilateral intracranial ICAs, MCAs, and ACAs are patent without proximal hemodynamically significant stenosis. Posterior circulation: Bilateral intradural vertebral arteries, basilar artery and right PCA are patent without proximal him at least significant stenosis. Left proximal to mid P2 PCA occlusion. MRA NECK FINDINGS Aortic arch: Great vessel origins are patent. Motion limited assessment. Right carotid system: On motion limited evaluation the common carotid internal carotid arteries appear patent without significant stenosis. Left carotid system: On motion limited evaluation the common carotid internal carotid arteries appear patent without significant stenosis. Vertebral arteries: Limited evaluation due to motion and contrast timing.  The vertebral arteries appear patent bilaterally without visible high-grade stenosis. IMPRESSION: 1. 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 2. Left proximal to mid P2 PCA occlusion. Electronically Signed: By: Gilmore GORMAN Molt M.D. On: 12/08/2023 20:45   MR Angiogram Neck W or Wo Contrast Result Date: 12/08/2023 CLINICAL DATA:  Provided history: Stroke/TIA, determine embolic source. EXAM: MRA NECK WITHOUT AND WITH CONTRAST TECHNIQUE: Multiplanar and multiecho pulse sequences of the neck were obtained without and with intravenous contrast. Angiographic images of the neck were obtained using MRA technique without and with intravenous contrast. CONTRAST:  5mL GADAVIST  GADOBUTROL  1  MMOL/ML IV SOLN COMPARISON:  Same-day brain MRI 12/08/2023. Same day MRA head 12/08/2023. FINDINGS: Aortic arch: Standard aortic branching. No hemodynamically significant innominate or proximal subclavian artery stenosis. Right carotid system: CCA and ICA patent within the neck without stenosis. Tortuosity of the cervical ICA. Left carotid system: CCA and ICA patent within the neck without stenosis. Tortuosity of the cervical ICA. Vertebral arteries: Patent within the neck without stenosis. The left vertebral artery is dominant. IMPRESSION: 1. The common carotid, internal carotid and vertebral arteries are patent within the neck without stenosis. 2. MRA head findings separately reported. Electronically Signed   By: Rockey Childs D.O.   On: 12/08/2023 20:42   CT Lumbar Spine Wo Contrast Result Date: 12/08/2023 CLINICAL DATA:  Fall with back pain EXAM: CT LUMBAR SPINE WITHOUT CONTRAST TECHNIQUE: Multidetector CT imaging of the lumbar spine was performed without intravenous contrast administration. Multiplanar CT image reconstructions were also generated. RADIATION DOSE REDUCTION: This exam was performed according to the departmental dose-optimization program which includes automated exposure control, adjustment of the mA and/or kV according to patient size and/or use of iterative reconstruction technique. COMPARISON:  Radiographs 12/08/2023, 11/18/2023, chest CT 02/12/2022 FINDINGS: Segmentation: Based on chest CT from October 2023, hypoplastic ribs are present at T12. Transitional anatomy, for the purposes of reporting, transitional vertebra will be designated sacralized L5 and the first non rib-bearing lumbar type vertebra will be designated L1, careful attention to lumbar numbering recommended if there is any planned intervention. Alignment: Normal. Vertebrae: Multiple sclerotic and lucent lesions throughout the spine corresponding to history of widespread skeletal metastatic disease. Comminuted fracture involving  the L1 vertebral body, involves the anterior and posterior vertebral body, including the superior endplate. Fracture through the posterior cortex of the vertebral body. The fracture also involves the bilateral pedicles. Fracture is present on radiographs from July 16, new finding since June 2024. Diffuse height loss of the vertebral body compared to prior CT images, estimated at 25%. Minimal 2 mm retropulsion of right posterior vertebral fracture fragment, no significant canal stenosis. No other discrete fractures are visualized. Paraspinal and other soft tissues: Paravertebral soft tissue swelling at L1. Disc levels: At L1-L2, disc space narrowing. Central, left foraminal and extraforaminal disc bulging. No high-grade canal stenosis. Facet degenerative changes. At L2-L3, disc space narrowing. Posterior disc osteophyte complex with diffuse disc bulge. Ligamentum flavum thickening and hypertrophic facet degenerative changes. Moderate severe canal stenosis. At L3-L4, disc space narrowing. Disc bulge, ligamentum flavum thickening and hypertrophic facet degeneration results in moderate severe canal stenosis. At L4-L5, disc space narrowing. Posterior disc osteophyte complex, ligamentum flavum thickening and hypertrophic facet degenerative changes results in severe canal stenosis. There is moderate bilateral foraminal narrowing. Rudimentary disc at transitional L5 and S1 segment. No canal stenosis. IMPRESSION: 1. Suspected transition anatomy as discussed above. 2. Comminuted likely pathologic fracture involving the L1 vertebral body, involving the anterior and posterior  vertebral body, including the superior endplate. The fracture also involves the bilateral pedicles. Diffuse height loss of the vertebral body compared to prior CT images, estimated at 25%. Minimal 2 mm retropulsion of right posterior vertebral fracture fragment, no significant canal stenosis. Fracture is likely subacute as it was present on radiograph from  July. 3. Multiple sclerotic and lucent lesions throughout the spine corresponding to history of widespread skeletal metastatic disease. 4. Multilevel degenerative changes with multilevel moderate severe canal stenosis due to combination of disc disease, ligamentum flavum thickening and hypertrophic facet degeneration Electronically Signed   By: Luke Bun M.D.   On: 12/08/2023 20:34   US  RENAL Result Date: 12/08/2023 CLINICAL DATA:  Acute kidney injury EXAM: RENAL / URINARY TRACT ULTRASOUND COMPLETE COMPARISON:  CT 10/14/2022 FINDINGS: Right Kidney: Renal measurements: 5.7 x 2.4 x 2.8 cm = volume: 20.7 mL. Cortex is echogenic. No hydronephrosis. Upper pole cyst measuring 26 mm, no imaging follow-up is recommended. Left Kidney: Renal measurements: 10.6 x 5.2 x 4.1 cm = volume: 115.6 mL. Cortex appears slightly echogenic. No hydronephrosis. Interpolar cyst measuring 35 mm, no imaging follow-up is recommended. Bladder: Appears normal for degree of bladder distention. Other: None. IMPRESSION: 1. Echogenic kidneys bilaterally consistent with medical renal disease, no hydronephrosis. There is atrophy of the right kidney Electronically Signed   By: Luke Bun M.D.   On: 12/08/2023 20:10   CT PELVIS WO CONTRAST Result Date: 12/08/2023 CLINICAL DATA:  Hip trauma EXAM: CT PELVIS WITHOUT CONTRAST TECHNIQUE: Multidetector CT imaging of the pelvis was performed following the standard protocol without intravenous contrast. RADIATION DOSE REDUCTION: This exam was performed according to the departmental dose-optimization program which includes automated exposure control, adjustment of the mA and/or kV according to patient size and/or use of iterative reconstruction technique. COMPARISON:  12/08/2023, 11/26/2023 PET-CT, hip radiograph 09/27/2023, CT 10/14/2022 FINDINGS: Urinary Tract:  No abnormality visualized. Bowel: Moderate retained feces at the rectum. No acute bowel wall thickening. Vascular/Lymphatic: Aortic  atherosclerosis. Tortuous ectatic common iliac arteries. No suspicious lymph nodes Reproductive:  Enlarged prostate Other:  Negative for pelvic effusion or free air. Musculoskeletal: Diffuse heterogeneous mineralization of the pelvis and femurs with faintly visible sclerosis and areas of subtle lucency, corresponding to history of widespread skeletal metastatic disease on recent PET CT. Status post intramedullary rodding of the right femur with intact hardware and no fracture. Suspicion of lucent femoral bone lesion with cortical erosion at the posterior lower trochanter, series 4, image 109. Status post intramedullary rodding of left femur across comminuted intertrochanteric fracture. There is mild sclerosis and periosteal new bone formation at the fracture margins consistent with more remote injury. No definite acute displaced fracture is seen. Markedly displaced lesser trochanteric fracture fragment. IMPRESSION: 1. Status post intramedullary rodding of left femur across comminuted intertrochanteric fracture. There is mild sclerosis and periosteal new bone formation at the fracture margins consistent with more remote injury. No definite acute displaced fracture is seen. 2. Status post intramedullary rodding of the right femur with intact hardware and no fracture. Suspicion of lucent femoral bone lesion with cortical erosion at the posterior lower trochanter. 3. Diffuse heterogeneous mineralization of the pelvis and femurs with faintly visible sclerosis and areas of subtle lucency, corresponding to history of widespread skeletal metastatic disease on recent PET CT. 4. Aortic atherosclerosis. Aortic Atherosclerosis (ICD10-I70.0). Electronically Signed   By: Luke Bun M.D.   On: 12/08/2023 20:06   DG Chest 1 View Result Date: 12/08/2023 CLINICAL DATA:  [id number] Sepsis [facility]) [id number] EXAM: CHEST  1 VIEW COMPARISON:  Chest x-ray 09/26/2023 FINDINGS: The heart and mediastinal contours are within normal limits.  Atherosclerotic plaque. Low lung volumes. No focal consolidation. No pulmonary edema. No pleural effusion. No pneumothorax. No acute osseous abnormality. IMPRESSION: 1. No active disease. 2.  Aortic Atherosclerosis (ICD10-I70.0). Electronically Signed   By: Morgane  Naveau M.D.   On: 12/08/2023 14:35   DG Lumbar Spine Complete Result Date: 12/08/2023 CLINICAL DATA:  fall EXAM: LUMBAR SPINE - COMPLETE 4+ VIEW COMPARISON:  CT abdomen pelvis 10/14/2022 FINDINGS: Limited evaluation due to overlapping osseous structures and overlying soft tissues. Age-indeterminate L2 vertebral body height loss. Multilevel moderate severe degenerative change of the spine. Multilevel osseous neural foraminal stenosis. Alignment is normal. Multilevel intervertebral disc space narrowing. Vascular calcification. IMPRESSION: Age-indeterminate L2 vertebral body height loss. Limited evaluation due to overlapping osseous structures and overlying soft tissues. Recommend cross-sectional imaging for further evaluation. Electronically Signed   By: Morgane  Naveau M.D.   On: 12/08/2023 14:34   DG HIP UNILAT WITH PELVIS 2-3 VIEWS LEFT Result Date: 12/08/2023 CLINICAL DATA:  [id number] Fall [id number] EXAM: DG HIP (WITH OR WITHOUT PELVIS) 2-3V LEFT; DG HIP (WITH OR WITHOUT PELVIS) 2-3V RIGHT COMPARISON:  PET CT 11/26/2023, x-ray left femur 09/27/2023 FINDINGS: There is no evidence of hip fracture or dislocation of the right hip. Redemonstration of partially visualized interlocking intramedullary nail fixation of the right femur. Redemonstration of subacute to chronic healing left femoral inter trochanteric fracture status post interlocking intramedullary nail fixation of the femur. Some periosteal reaction noted along the left intertrochanteric fracture with superimposed acute fracture not excluded. No acute displaced fracture or diastasis of the bones of the pelvis. There is no evidence of arthropathy or other focal bone abnormality. IMPRESSION: 1.  Bilateral interlocking intramedullary nail fixation with healing subacute to chronic left femoral intertrochanteric fracture. Some periosteal reaction noted along the left intertrochanteric fracture with superimposed periprosthetic acute inter trochanteric fracture not excluded. 2. Negative for acute traumatic injury of the right hip or bones of the pelvis. Electronically Signed   By: Morgane  Naveau M.D.   On: 12/08/2023 14:32   DG HIP UNILAT WITH PELVIS 2-3 VIEWS RIGHT Result Date: 12/08/2023 CLINICAL DATA:  [id number] Fall [id number] EXAM: DG HIP (WITH OR WITHOUT PELVIS) 2-3V LEFT; DG HIP (WITH OR WITHOUT PELVIS) 2-3V RIGHT COMPARISON:  PET CT 11/26/2023, x-ray left femur 09/27/2023 FINDINGS: There is no evidence of hip fracture or dislocation of the right hip. Redemonstration of partially visualized interlocking intramedullary nail fixation of the right femur. Redemonstration of subacute to chronic healing left femoral inter trochanteric fracture status post interlocking intramedullary nail fixation of the femur. Some periosteal reaction noted along the left intertrochanteric fracture with superimposed acute fracture not excluded. No acute displaced fracture or diastasis of the bones of the pelvis. There is no evidence of arthropathy or other focal bone abnormality. IMPRESSION: 1. Bilateral interlocking intramedullary nail fixation with healing subacute to chronic left femoral intertrochanteric fracture. Some periosteal reaction noted along the left intertrochanteric fracture with superimposed periprosthetic acute inter trochanteric fracture not excluded. 2. Negative for acute traumatic injury of the right hip or bones of the pelvis. Electronically Signed   By: Morgane  Naveau M.D.   On: 12/08/2023 14:32   CT Head Wo Contrast Result Date: 12/08/2023 EXAM: CT HEAD WITHOUT CONTRAST 12/08/2023 01:03:19 PM TECHNIQUE: CT of the head was performed without the administration of intravenous contrast. Automated exposure  control, iterative reconstruction, and/or weight based adjustment of the mA/kV was utilized to reduce the  radiation dose to as low as reasonably achievable. COMPARISON: 02/11/2022 CLINICAL HISTORY: Mental status change, unknown cause. AMS. FINDINGS: BRAIN AND VENTRICLES: Since the previous study, the patient has developed a late subacute infarct within the left occipital lobe. There are also new subacute lacunar infarcts within the left thalamus. There is mild-to-moderate periventricular white matter disease. No acute hemorrhage. Gray-white differentiation is preserved. No hydrocephalus. No extra-axial collection. No mass effect or midline shift. ORBITS: The patient is status post bilateral lens replacement. No acute abnormality. SINUSES: No acute abnormality. SOFT TISSUES AND SKULL: No acute soft tissue abnormality. No skull fracture. VASCULATURE: There is calcification within the cavernous carotid arteries. IMPRESSION: 1. Late subacute infarct within the left occipital lobe and new subacute lacunar infarcts within the left thalamus. 2. Mild-to-moderate periventricular white matter disease. 3. Calcification within the cavernous carotid arteries. 4. Status post bilateral lens replacement. Electronically signed by: evalene coho 12/08/2023 01:37 PM EDT RP Workstation: [id number]   NM PLUVICTO  ADMINISTRATION Result Date: 12/02/2023 CLINICAL DATA:  89 year old male with prostate cancer. 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 EXAM: NUCLEAR MEDICINE PLUVICTO  INJECTION TECHNIQUE: Infusion: The nuclear medicine technologist and I personally verified the dose activity to be delivered as specified in the written directive, and verified the patient identification via 2 separate methods. Initial flush of the intravenous catheter was performed was sterile saline. The dose syringe was connected to the catheter and the Lu-177  Pluvicto  administered over a 1 to 10 min infusion. Single 10 cc lushes with normal saline follow the dose. No complications were noted. The entire IV tubing, venocatheter, stopcock and syringes was removed in total, placed in a disposal bag and sent for assay of the residual activity, which will be reported at a later time in our EMR by the physics staff. Pressure was applied to the venipuncture site, and a compression bandage placed. Patient monitored for 1 hour following infusion. Radiation Safety personnel were present to perform the discharge survey, as detailed on their documentation. After a short period of observation, the patient had his IV removed. RADIOPHARMACEUTICALS:  211.9 microcuries Lu-177 PLUVICTO  FINDINGS: Current Infusion: 1 Planned Infusions: 6 Patient presented to nuclear medicine for treatment. Patient accompanied by son. Risk and benefits again explained and consent obtained. The patient's most recent blood counts were reviewed and remains a adequate candidate to proceed with Lu-177 Pluvicto . Hemoglobin equal 11.1. Normal white blood cell count. Normal platelet count. Creatinine equal 1.52 PSA equal greater than 1,500.  PSA equal 607 one  month prior. The patient was situated in an infusion suite with a contact barrier placed under the arm. Intravenous access was established, using sterile technique, and a normal saline infusion from a syringe was started. Micro-dosimetry: The prescribed radiation activity was assayed and confirmed to be within specified tolerance. IMPRESSION: Current Infusion: 1 Planned Infusions: 6 The patient tolerated the infusion well. The patient will return in one month for ongoing care. Electronically Signed   By: Jackquline Boxer M.D.   On: 12/02/2023 15:41   NM Radiologist Eval And Mgmt Result Date: 11/26/2023 EXAM: NEW PATIENT OFFICE VISIT CHIEF COMPLAINT: Metastatic castrate resistant prostate carcinoma Current Pain Level: 1-10 HISTORY OF PRESENT ILLNESS:  89 year old male with castrate resistant metastatic prostate carcinoma. Initial diagnosis in 2022 with Gleason 4+5=9 prostate carcinoma with metastatic lymph nodes and metastatic bone disease. Patient has undergone androgen deprivation. Evidence of progression on androgen deprivation. Recent PSMA PET scan 11/26/2023 demonstrates  marked progression radiotracer avid skeletal metastasis. Additional progression liver metastasis. PSA rapidly increasing and now greater than 1,000. REVIEW OF SYSTEMS: See epic note PHYSICAL EXAMINATION: See epic note ASSESSMENT AND PLAN: See epic note Electronically Signed   By: Jackquline Boxer M.D.   On: 11/26/2023 16:18   NM PET (PSMA) SKULL TO MID THIGH Result Date: 11/26/2023 CLINICAL DATA:  Prostate carcinoma with biochemical recurrence. EXAM: NUCLEAR MEDICINE PET SKULL BASE TO THIGH TECHNIQUE: 8.5 mCi Flotufolastat (Posluma ) was injected intravenously. Full-ring PET imaging was performed from the skull base to thigh after the radiotracer. CT data was obtained and used for attenuation correction and anatomic localization. COMPARISON:  PSA PET scan 430 1,020 FINDINGS: NECK No radiotracer activity in neck lymph nodes. Incidental CT finding: None. CHEST No radiotracer accumulation within mediastinal or hilar lymph nodes. No suspicious pulmonary nodules on the CT scan. Incidental CT finding: None. ABDOMEN/PELVIS Prostate: No abnormal activity in the prostatectomy bed. Lymph nodes: No abnormal radiotracer accumulation within pelvic or abdominal nodes. Liver: There are several new radiotracer avid lymph nodes within the liver. Previously there were 2 or 3 discrete lesions. Now there are approximately 20 small lesions. The most intense lesion is in the lateral segment LEFT hepatic lobe with SUV max equal 50 on image 91. Incidental CT finding: Benign renal cysts. The RIGHT kidney is smaller than the LEFT. Atherosclerotic calcification of the aorta. SKELETON There is significant progression  of skeletal metastasis. Previously there are proximally 50 lesions. Now the lesions number approximately 200 lesions . Essentially every vertebral body is involved. Extensive activity within the LEFT and RIGHT femurs. Multiple rib lesions. Bilateral shoulder girdles involved. Example lesion at L1 with SUV max equal 68.4. Interval internal fixation of the LEFT proximal femur. Prior fixation the RIGHT femur. IMPRESSION: 1. Significant progression of skeletal metastasis. 2. Significant progression of hepatic metastasis. 3. No evidence of local recurrence in the prostatectomy bed. 4.  Aortic Atherosclerosis (ICD10-I70.0). Electronically Signed   By: Jackquline Boxer M.D.   On: 11/26/2023 15:26   XR FEMUR MIN 2 VIEWS LEFT Result Date: 11/18/2023 X-rays of the left femur from 11/18/2023 were independently reviewed and interpreted, showing displaced intertrochanteric femur fracture which appears unchanged in terms of displacement and alignment from prior films on 10/14/2023.  There is a long cephalomedullary rod in place spanning the fracture.  The interlocking screws are without any lucency around them.  There is no lucency seen around the lag screws.  No new fracture seen.  XR Lumbar Spine 2-3 Views Result Date: 11/18/2023 XRs of the lumbar spine from 11/18/2023 were independent reviewed and interpreted, showing disc height loss at L4/5 and L5/S1.  No spondylolisthesis seen.  Compression fracture seen at L2.  No other fracture seen.  No dislocation seen.   Subjective: Patient was seen and examined at bedside.  Overnight events noted. Patient appears slightly better than yesterday,  following commands.   Patient is being discharged to skilled nursing facility for rehab Today.  Discharge Exam: Vitals:   12/17/23 0013 12/17/23 0749  BP: 133/78 126/61  Pulse: (!) 103 97  Resp: 14 15  Temp: 99.3 F (37.4 C) 99.8 F (37.7 C)  SpO2: 100% 98%   Vitals:   12/16/23 1617 12/16/23 2016 12/17/23 0013 12/17/23  0749  BP: 139/76 136/72 133/78 126/61  Pulse: (!) 103 96 (!) 103 97  Resp: 18 14 14 15   Temp: 98.4 F (36.9 C) 99.3 F (37.4 C) 99.3 F (37.4 C) 99.8 F (37.7 C)  TempSrc: Axillary Oral Oral Oral  SpO2: 100% 99% 100% 98%  Weight:      Height:        General: Pt is alert, awake, not in acute distress Cardiovascular: RRR, S1/S2 +, no rubs, no gallops Respiratory: CTA bilaterally, no wheezing, no rhonchi Abdominal: Soft, NT, ND, bowel sounds + Extremities: no edema, no cyanosis    The results of significant diagnostics from this hospitalization (including imaging, microbiology, ancillary and laboratory) are listed below for reference.     Microbiology: Recent Results (from the past 240 hours)  Blood Culture (routine x 2)     Status: None   Collection Time: 12/08/23  3:15 PM   Specimen: BLOOD  Result Value Ref Range Status   Specimen Description BLOOD SITE NOT SPECIFIED  Final   Special Requests   Final    BOTTLES DRAWN AEROBIC AND ANAEROBIC Blood Culture results may not be optimal due to an inadequate volume of blood received in culture bottles   Culture   Final    NO GROWTH 5 DAYS Performed at [facility], [address], [place], [place] [address]    Report Status 12/13/2023 FINAL  Final  Blood Culture (routine x 2)     Status: None   Collection Time: 12/08/23  3:20 PM   Specimen: BLOOD  Result Value Ref Range Status   Specimen Description BLOOD SITE NOT SPECIFIED  Final   Special Requests   Final    BOTTLES DRAWN AEROBIC AND ANAEROBIC Blood Culture results may not be optimal due to an inadequate volume of blood received in culture bottles   Culture   Final    NO GROWTH 5 DAYS Performed at [facility], [address] [place], [place] [address]    Report Status 12/13/2023 FINAL  Final     Labs: BNP (last 3 results) No results for input(s): BNP in the last 8760 hours. Basic Metabolic Panel: Recent Labs  Lab 12/12/23 0335 12/12/23 0416  12/13/23 0626 12/14/23 0820 12/15/23 0419 12/16/23 0400  NA 133*  --  137 138 138 138  K 3.5  --  4.1 4.6 4.2 4.1  CL 108  --  110 112* 111 110  CO2 16*  --  19* 15* 17* 18*  GLUCOSE 171*  --  151* 130* 162* 178*  BUN 28*  --  29* 29* 31* 30*  CREATININE 2.32*  --  2.51* 2.65* 2.74* 2.75*  CALCIUM  7.0*  --  7.6* 8.0* 8.7* 8.8*  MG  --  1.2* 1.9  --   --   --    Liver Function Tests: Recent Labs  Lab 12/15/23 0419 12/16/23 0400  AST 28 27  ALT 20 19  ALKPHOS 107 111  BILITOT 0.8 0.9  PROT 5.5* 5.4*  ALBUMIN  2.1* 2.0*   No results for input(s): LIPASE, AMYLASE in the last 168 hours. No results for input(s): AMMONIA in the last 168 hours. CBC: Recent Labs  Lab 12/12/23 0335 12/13/23 0626 12/14/23 0820 12/14/23 1629 12/15/23 0419 12/16/23 0400  WBC 5.5 4.7 4.4  --  5.8 6.3  HGB 8.7* 8.9* 8.2* 9.1* 8.8* 8.7*  HCT 26.7* 27.6* 26.4* 28.8* 27.1* 26.7*  MCV 87.5 88.2 92.3  --  88.3 87.5  PLT 158 186 171  --  208 204   Cardiac Enzymes: No results for input(s): CKTOTAL, CKMB, CKMBINDEX, TROPONINI in the last 168 hours. BNP: Invalid input(s): POCBNP CBG: Recent Labs  Lab 12/16/23 0609 12/16/23 1124  12/16/23 1614 12/16/23 2312 12/17/23 0845  GLUCAP 162* 167* 197* 167* 166*   D-Dimer No results for input(s): DDIMER in the last 72 hours. Hgb A1c No results for input(s): HGBA1C in the last 72 hours. Lipid Profile No results for input(s): CHOL, HDL, LDLCALC, TRIG, CHOLHDL, LDLDIRECT in the last 72 hours. Thyroid  function studies No results for input(s): TSH, T4TOTAL, T3FREE, THYROIDAB in the last 72 hours.  Invalid input(s): FREET3 Anemia work up Recent Labs    12/15/23 0419  FERRITIN 941*  TIBC 182*  IRON  30*   Urinalysis    Component Value Date/Time   COLORURINE YELLOW 12/09/2023 0421   APPEARANCEUR HAZY (A) 12/09/2023 0421   LABSPEC 1.012 12/09/2023 0421   PHURINE 5.0 12/09/2023 0421   GLUCOSEU NEGATIVE  12/09/2023 0421   GLUCOSEU NEGATIVE 07/24/2016 0908   HGBUR SMALL (A) 12/09/2023 0421   BILIRUBINUR NEGATIVE 12/09/2023 0421   KETONESUR 5 (A) 12/09/2023 0421   PROTEINUR 100 (A) 12/09/2023 0421   UROBILINOGEN 0.2 07/24/2016 0908   NITRITE NEGATIVE 12/09/2023 0421   LEUKOCYTESUR NEGATIVE 12/09/2023 0421   Sepsis Labs Recent Labs  Lab 12/13/23 0626 12/14/23 0820 12/15/23 0419 12/16/23 0400  WBC 4.7 4.4 5.8 6.3   Microbiology Recent Results (from the past 240 hours)  Blood Culture (routine x 2)     Status: None   Collection Time: 12/08/23  3:15 PM   Specimen: BLOOD  Result Value Ref Range Status   Specimen Description BLOOD SITE NOT SPECIFIED  Final   Special Requests   Final    BOTTLES DRAWN AEROBIC AND ANAEROBIC Blood Culture results may not be optimal due to an inadequate volume of blood received in culture bottles   Culture   Final    NO GROWTH 5 DAYS Performed at [facility], [address], [place], [place] [address]    Report Status 12/13/2023 FINAL  Final  Blood Culture (routine x 2)     Status: None   Collection Time: 12/08/23  3:20 PM   Specimen: BLOOD  Result Value Ref Range Status   Specimen Description BLOOD SITE NOT SPECIFIED  Final   Special Requests   Final    BOTTLES DRAWN AEROBIC AND ANAEROBIC Blood Culture results may not be optimal due to an inadequate volume of blood received in culture bottles   Culture   Final    NO GROWTH 5 DAYS Performed at [facility], [address], [place], [place] [address]    Report Status 12/13/2023 FINAL  Final     Time coordinating discharge: Over 30 minutes  SIGNED:   Darcel Dawley, MD  [facility] 12/17/2023, 10:56 AM Pager   If 7PM-7AM, please contact night-coverage

## 2023-12-17 NOTE — Progress Notes (Signed)
 Report given to nurse Ashontie at [facility]. All questions answered.

## 2023-12-17 NOTE — TOC Transition Note (Signed)
 Transition of Care [facility]) - Discharge Note   Patient Details  Name: Marco [name] MRN: [id number] Date of Birth: 07/24/1933  [facility] CM/SW Contact:  Andrez JULIANNA George, RN Phone Number: 12/17/2023, 11:44 AM   Clinical Narrative:     Pt is discharging to [facility] today. He will transport via [facility]. Son at the bedside and in agreement.   Room: 510 Number for report: [phone]  Final next level of care: Skilled Nursing Facility Barriers to Discharge: No Barriers Identified   Patient Goals and CMS Choice   CMS Medicare.gov Compare Post Acute Care list provided to:: Patient Represenative (must comment) Choice offered to / list presented to : Adult Children      Discharge Placement              Patient chooses bed at: [facility] Patient to be transferred to facility by: [facility] Name of family member notified: Son at bedside Patient and family notified of of transfer: 12/17/23  Discharge Plan and Services Additional resources added to the After Visit Summary for   In-house Referral: Clinical Social Work Discharge Planning Services: CM Consult Post Acute Care Choice: Skilled Nursing Facility                               Social Drivers of Health (SDOH) Interventions SDOH Screenings   Food Insecurity: No Food Insecurity (12/08/2023)  Housing: Low Risk  (12/08/2023)  Transportation Needs: No Transportation Needs (12/08/2023)  Utilities: Not At Risk (12/08/2023)  Alcohol  Screen: Low Risk  (09/04/2021)  Depression (PHQ2-9): Low Risk  (11/23/2023)  Financial Resource Strain: Low Risk  (10/30/2021)  Physical Activity: Inactive (10/25/2020)  Social Connections: Moderately Isolated (12/08/2023)  Stress: No Stress Concern Present (09/04/2021)  Tobacco Use: Low Risk  (12/08/2023)     Readmission Risk Interventions     No data to display

## 2023-12-17 NOTE — Progress Notes (Signed)
 Speech Language Pathology Treatment: Dysphagia  Patient Details Name: Marco [name] MRN: [id number] DOB: 21-Apr-1934 Today's Date: 12/17/2023 Time: 8959-8940 SLP Time Calculation (min) (ACUTE ONLY): 19 min  Assessment / Plan / Recommendation Clinical Impression  SLP planned to see pt today and MD placed new swallow order. Pt is discharging to SNF today. He continues to be deconditioned, awake but fatigued with decreased reserve. Explained to son SLP will attempt higher texture if pt able. SLP donned lower denture plate and pt pushed plate out with tongue and moving therapist's hand away x 2 and unable to trial solids. Discussed with son who stated he is in agreement and desires pt to remain on puree and therapist at SNF can continue working on upgrade if pt able. He is exhibiting oral holding with puree pineapple (mild) during session however RN reported night RN needed to suction meds/applesauce last night. Educated son to look/feel pt's throat for swallow which he stated he has been doing and giving water after every several bites. Was unable to express water from straw but accepted from cup with min anterior spill and min-mild oral holding without s/s aspiration. Continue Dys 1 (puree), thin liquids, crush meds, full assist allowing time for swallow initiation.     HPI HPI: Marco [name] is a 89 y.o. 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      SLP Plan  Continue with current plan of care          Recommendations   Diet recommendations: Dysphagia 1 (puree);Thin liquid Liquids provided via: Cup;Straw (straw as able) Medication Administration: Crushed with puree Supervision: Full supervision/cueing for compensatory strategies Compensations: Minimize environmental distractions;Slow rate;Small sips/bites;Lingual sweep for clearance of pocketing Postural Changes and/or Swallow Maneuvers: Seated upright 90 degrees                  Oral care BID   Frequent or constant Supervision/Assistance Dysphagia, oral phase (R13.11)     Continue with current plan of care     [name]  12/17/2023, 11:10 AM

## 2023-12-21 ENCOUNTER — Other Ambulatory Visit: Payer: Self-pay | Admitting: Internal Medicine

## 2023-12-22 ENCOUNTER — Encounter (HOSPITAL_COMMUNITY)

## 2023-12-22 ENCOUNTER — Ambulatory Visit: Admitting: Internal Medicine

## 2023-12-28 ENCOUNTER — Inpatient Hospital Stay: Attending: Internal Medicine

## 2023-12-29 NOTE — Progress Notes (Signed)
 Nurse Navigator services not currently indicated at this time. Will re-evaluate if needs change or if additional support is requested.

## 2023-12-30 ENCOUNTER — Ambulatory Visit: Admitting: Orthopedic Surgery

## 2023-12-30 ENCOUNTER — Inpatient Hospital Stay: Admitting: Nurse Practitioner

## 2023-12-30 ENCOUNTER — Ambulatory Visit: Admitting: Physician Assistant

## 2024-01-04 DEATH — deceased

## 2024-01-07 ENCOUNTER — Inpatient Hospital Stay

## 2024-01-18 ENCOUNTER — Telehealth: Payer: Self-pay

## 2024-01-18 NOTE — Telephone Encounter (Signed)
 Spoke w/ [facility]- informed that PCP has not seen Pt since 07/2023- unable to sign orders, states these are orders for June and July. I asked that she re-fax them but PCP may not sign until visit.

## 2024-01-18 NOTE — Telephone Encounter (Signed)
 Copied from CRM [id number]. Topic: General - Other >> Jan 18, 2024  2:09 PM Berneda FALCON wrote: Reason for CRM: Terri from [facility] is calling to check on the status of an order. This is an old order from 7/3 and she got the fax back stating the patient has not been seen in 90-days but this order request is for Home Health back in July. This would be for a start of care order for the Home Health.  There is another for a certification period change from 6/29 and the episode adjustment for 7/2 (when they changed insurance).  [phone] Fax number is [phone]

## 2024-01-19 NOTE — Telephone Encounter (Signed)
 Orders signed by PCP and faxed back to [phone]. Form sent for scanning.

## 2024-01-21 ENCOUNTER — Encounter (HOSPITAL_COMMUNITY)

## 2024-03-03 ENCOUNTER — Other Ambulatory Visit (HOSPITAL_COMMUNITY)

## 2024-03-07 ENCOUNTER — Encounter: Payer: Self-pay | Admitting: Radiology

## 2024-05-17 ENCOUNTER — Telehealth: Payer: Self-pay

## 2024-05-17 NOTE — Telephone Encounter (Signed)
 Pt passed away on 2024/01/22.

## 2024-05-17 NOTE — Telephone Encounter (Signed)
 I am sorry, he was a very nice gentleman
